# Patient Record
Sex: Male | Born: 1960 | Race: White | Hispanic: No | Marital: Single | Smoking: Never smoker
Health system: Southern US, Community
[De-identification: ages and names within clinical notes are randomized; demographics above are authoritative.]

## PROBLEM LIST (undated history)

## (undated) DIAGNOSIS — J45909 Unspecified asthma, uncomplicated: Secondary | ICD-10-CM

## (undated) DIAGNOSIS — E785 Hyperlipidemia, unspecified: Secondary | ICD-10-CM

## (undated) DIAGNOSIS — N200 Calculus of kidney: Secondary | ICD-10-CM

## (undated) DIAGNOSIS — I639 Cerebral infarction, unspecified: Secondary | ICD-10-CM

## (undated) DIAGNOSIS — I2510 Atherosclerotic heart disease of native coronary artery without angina pectoris: Secondary | ICD-10-CM

## (undated) DIAGNOSIS — G1221 Amyotrophic lateral sclerosis: Secondary | ICD-10-CM

## (undated) HISTORY — PX: KNEE SURGERY: SHX244

---

## 1998-10-02 HISTORY — PX: CYSTOSCOPY/RETROGRADE/URETEROSCOPY/STONE EXTRACTION WITH BASKET: SHX5317

## 2004-10-02 HISTORY — PX: CARDIAC CATHETERIZATION: SHX172

## 2005-02-15 ENCOUNTER — Emergency Department (HOSPITAL_COMMUNITY): Admission: EM | Admit: 2005-02-15 | Discharge: 2005-02-15 | Payer: Self-pay | Admitting: Emergency Medicine

## 2005-02-15 IMAGING — CR DG LUMBAR SPINE COMPLETE 4+V
5 series · 5 of 5 positions shown · non-contrast
Comparison: none

CLINICAL DATA: Fall, pain. 
 LUMBAR SPINE ? 4 VIEW, [DATE] AT [5C] HOURS:

[view not recorded (1 of 5)]
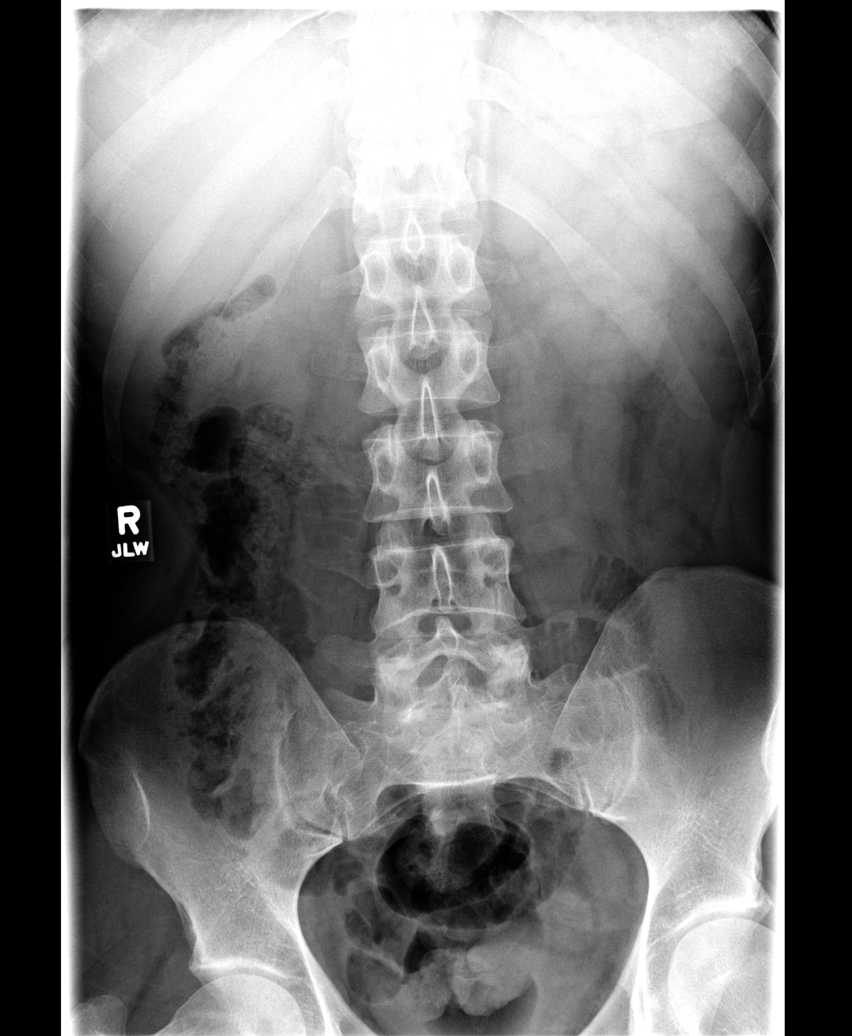

[view not recorded (2 of 5)]
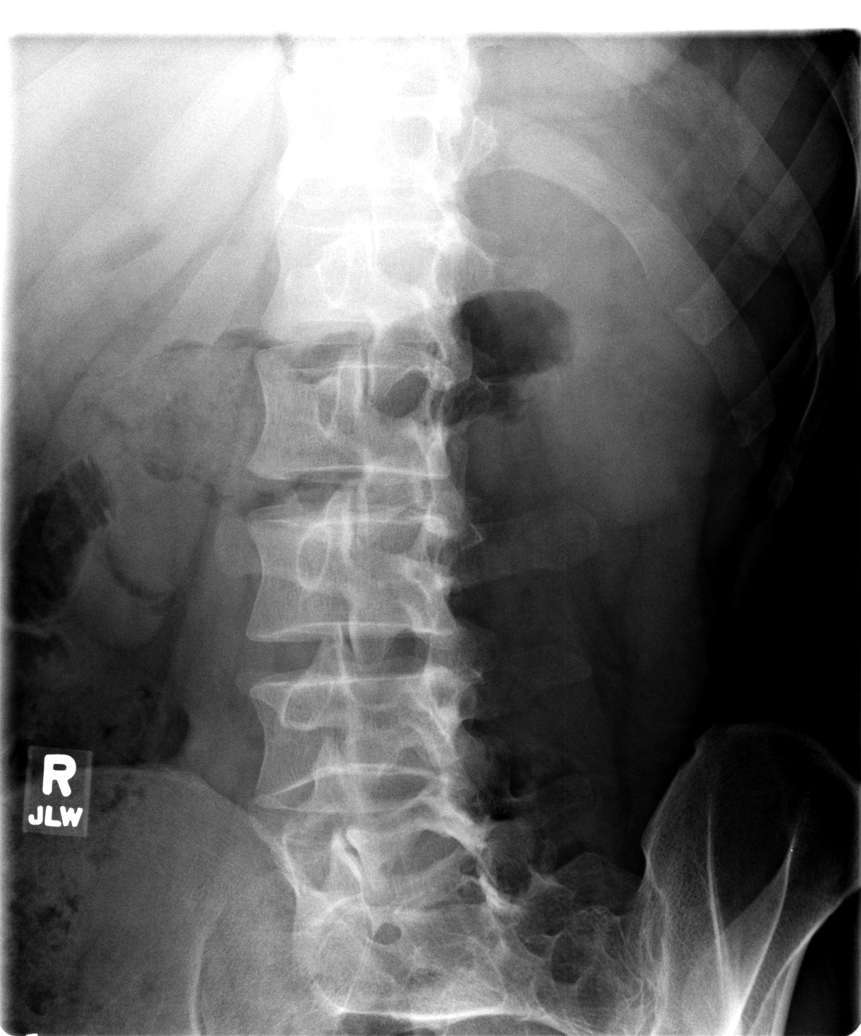

[view not recorded (3 of 5)]
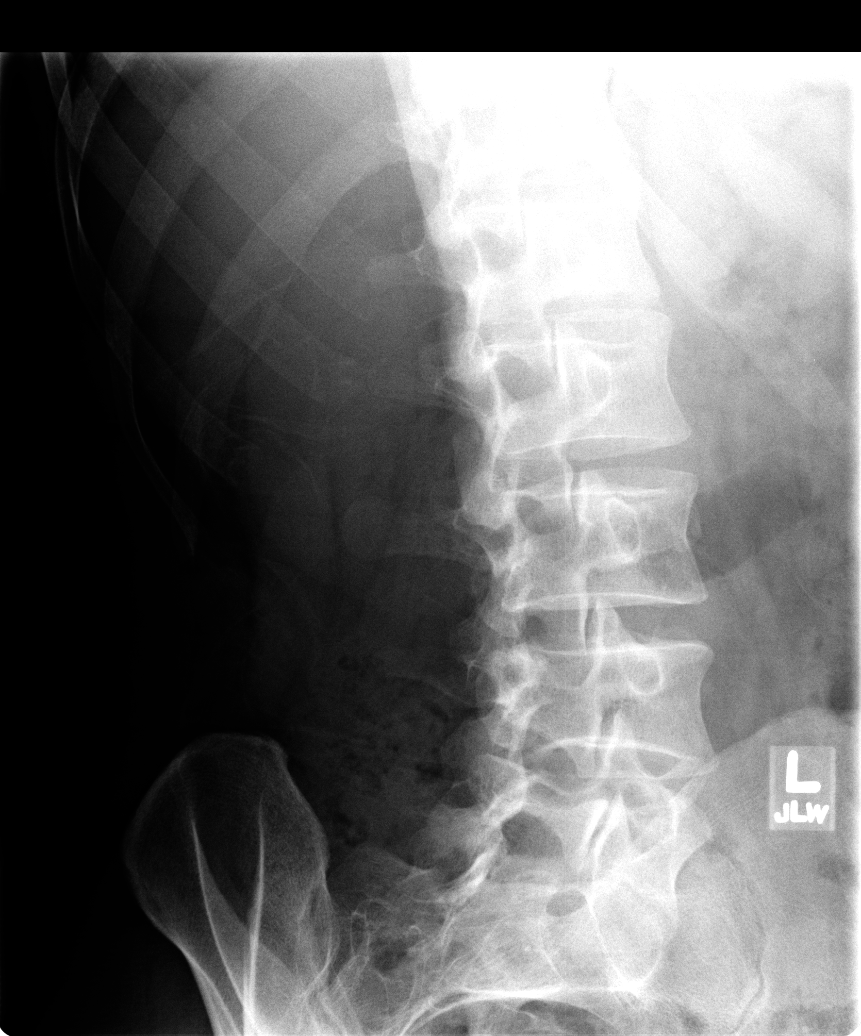

[view not recorded (4 of 5)]
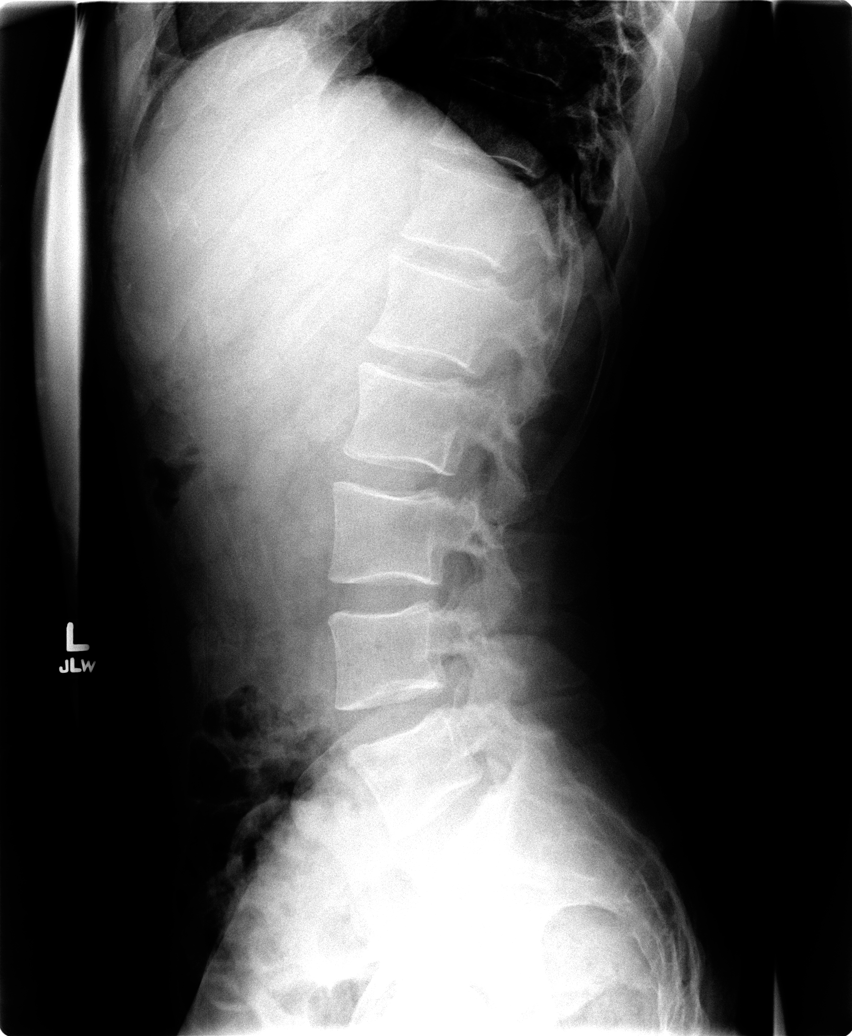

[view not recorded (5 of 5)]
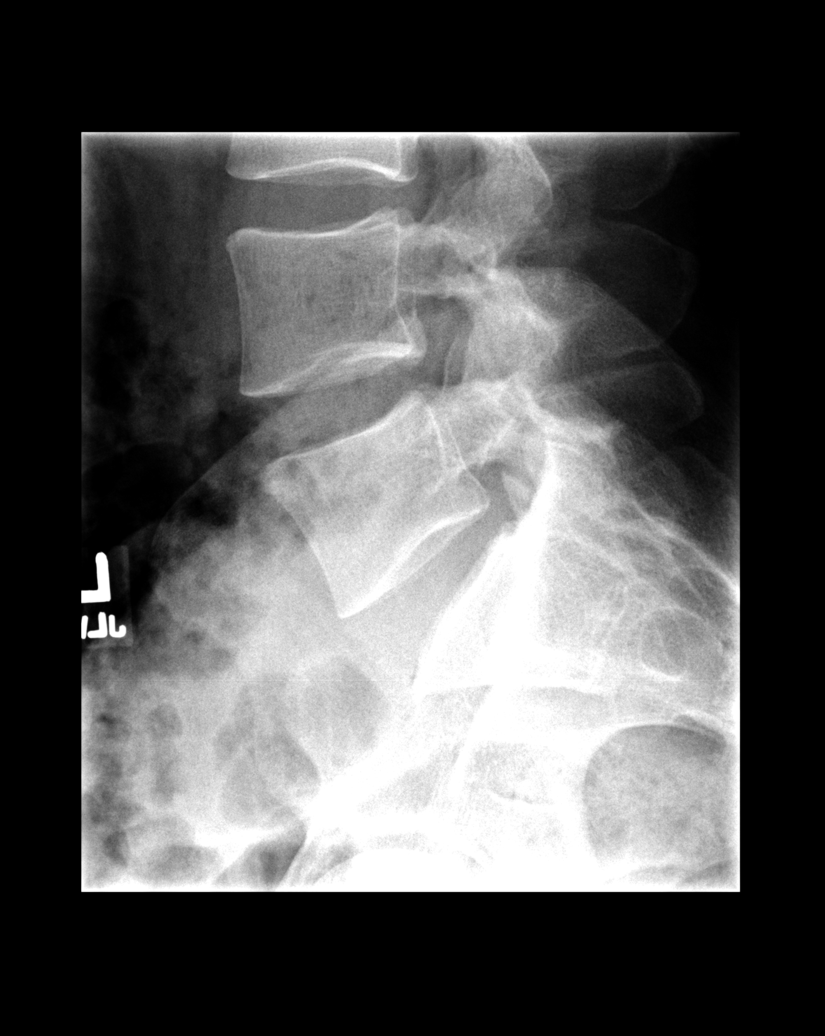

[5 of 5 positions shown; findings below may reference images not displayed]

FINDINGS: There is anatomic alignment through the vertebral bodies.  There is no vertebral body height loss.  Disc height is maintained.  No pars defects are seen.  Little if any degenerative change in the facets of the lower lumbar spine is seen.
IMPRESSION: No acute bony injury in the lumbar spine. 
 CERVICAL SPINE ? 4 VIEW, [DATE] AT [5C] HOURS:
FINDINGS: Severe narrowing of the C5-6 disc space is present with anterior and posterior osteophytes.  Moderate narrowing at C4-5 is present.  There is 1-2 mm retrolisthesis of C5 upon C6 noted, felt to be due to degenerative change.  There is no vertebral body height loss.  The soft tissues are within normal limits.  No definite acute fractures are seen.  The odontoid is within normal limits.  The swimmer?s view is included on the thoracic spine studies.
IMPRESSION: No acute bony injury in the cervical spine.  Degenerative changes are noted.

## 2005-02-15 IMAGING — CR DG CERVICAL SPINE COMPLETE 4+V
5 series · 5 of 5 positions shown · non-contrast
Comparison: none

CLINICAL DATA: Fall, pain. 
 LUMBAR SPINE ? 4 VIEW, [DATE] AT [5C] HOURS:

[view not recorded (1 of 5)]
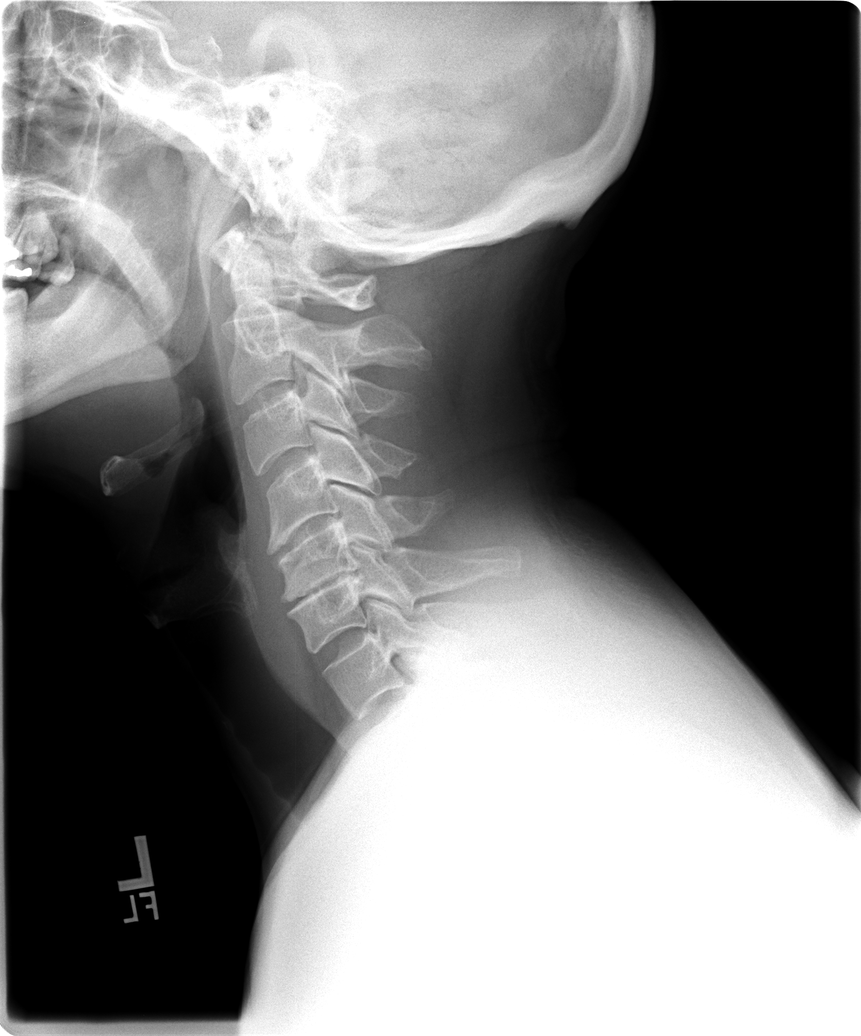

[view not recorded (2 of 5)]
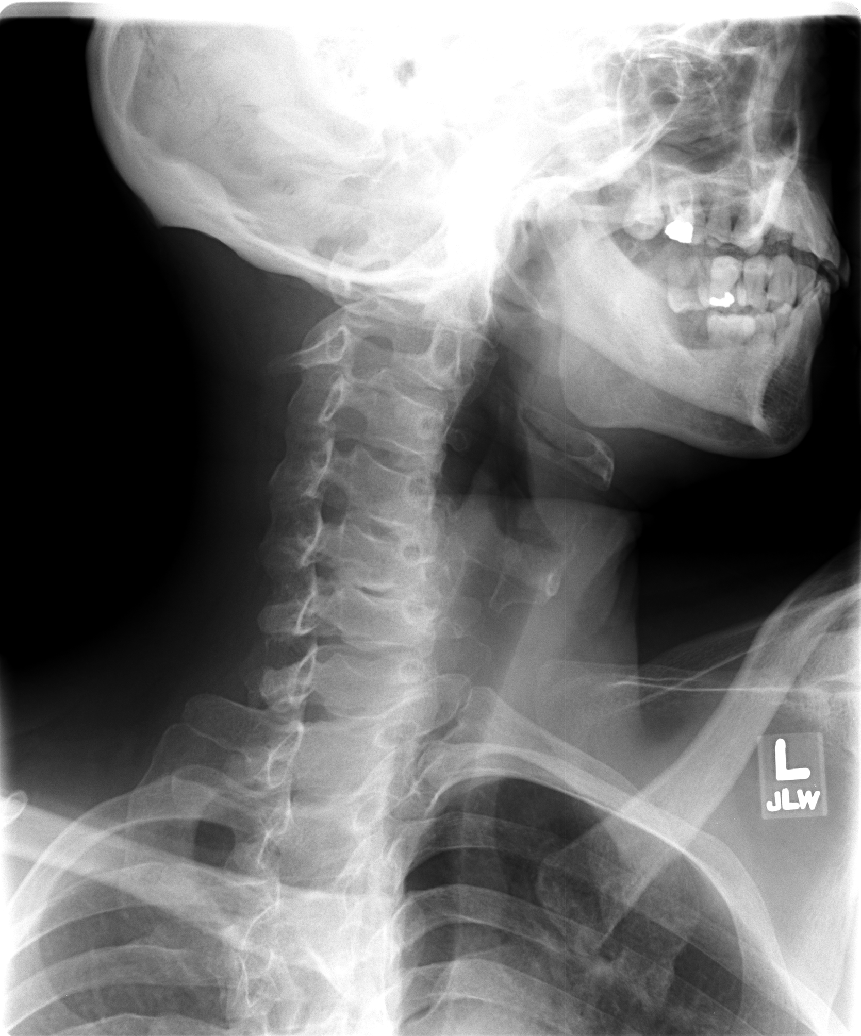

[view not recorded (3 of 5)]
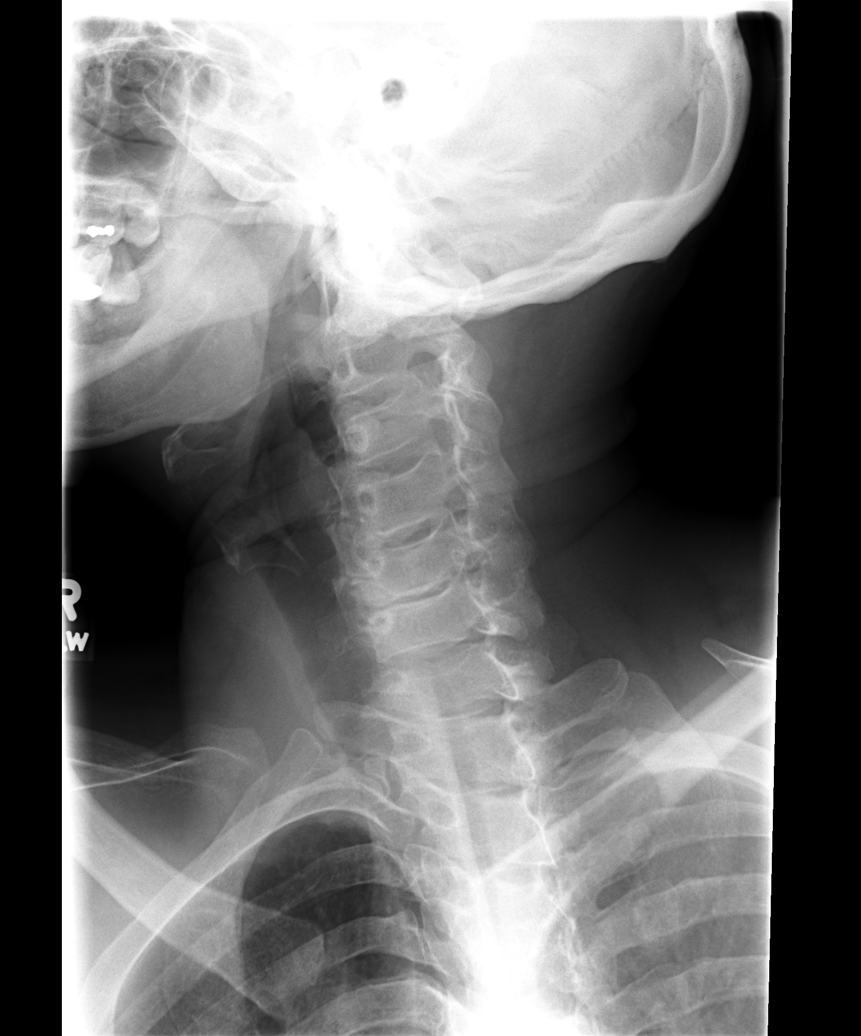

[view not recorded (4 of 5)]
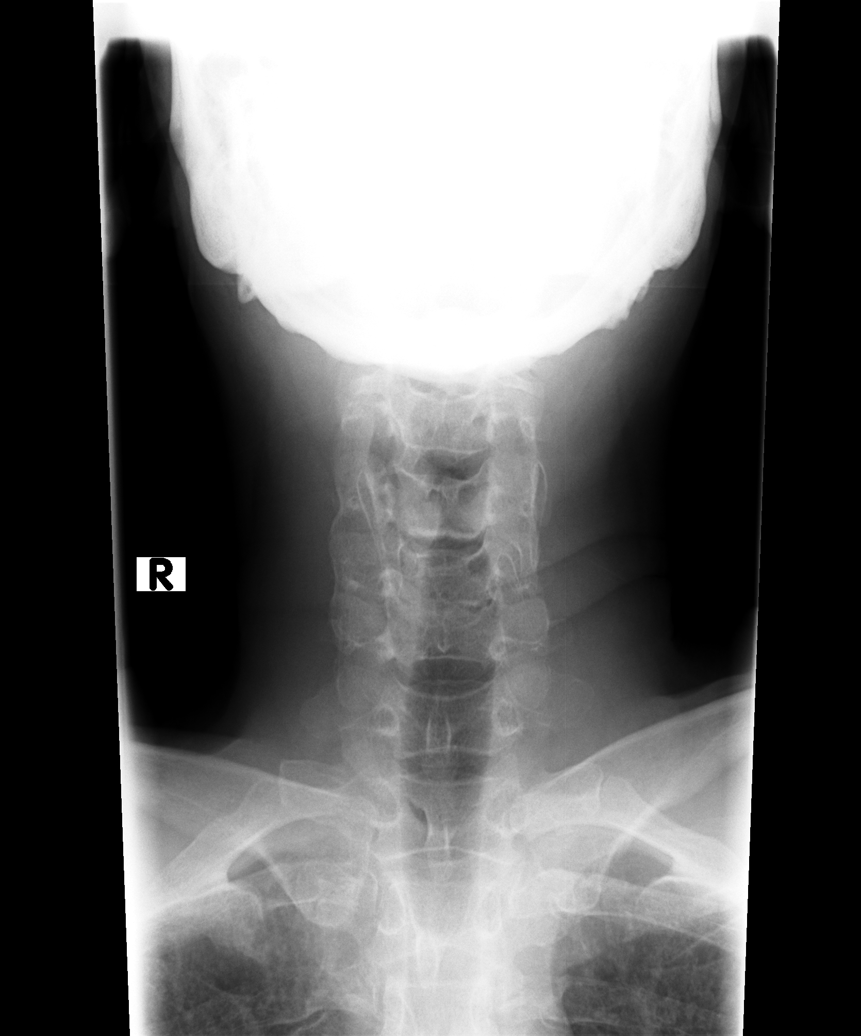

[view not recorded (5 of 5)]
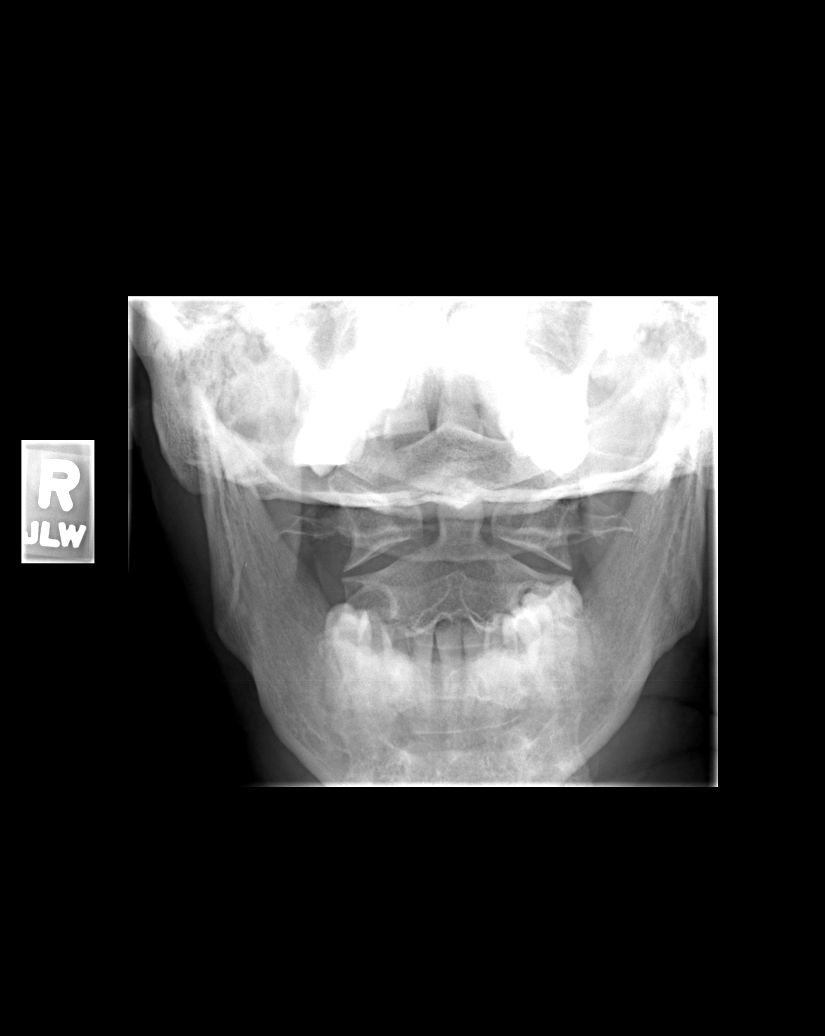

[5 of 5 positions shown; findings below may reference images not displayed]

FINDINGS: There is anatomic alignment through the vertebral bodies.  There is no vertebral body height loss.  Disc height is maintained.  No pars defects are seen.  Little if any degenerative change in the facets of the lower lumbar spine is seen.
IMPRESSION: No acute bony injury in the lumbar spine. 
 CERVICAL SPINE ? 4 VIEW, [DATE] AT [5C] HOURS:
FINDINGS: Severe narrowing of the C5-6 disc space is present with anterior and posterior osteophytes.  Moderate narrowing at C4-5 is present.  There is 1-2 mm retrolisthesis of C5 upon C6 noted, felt to be due to degenerative change.  There is no vertebral body height loss.  The soft tissues are within normal limits.  No definite acute fractures are seen.  The odontoid is within normal limits.  The swimmer?s view is included on the thoracic spine studies.
IMPRESSION: No acute bony injury in the cervical spine.  Degenerative changes are noted.

## 2005-02-15 IMAGING — CR DG THORACIC SPINE 2V
3 series · 3 of 3 positions shown · non-contrast
Comparison: none

CLINICAL DATA: Fall. 
 THORACIC SPINE ? 2 VIEW, [DATE] AT [CE] HOURS:

[view not recorded (1 of 3)]
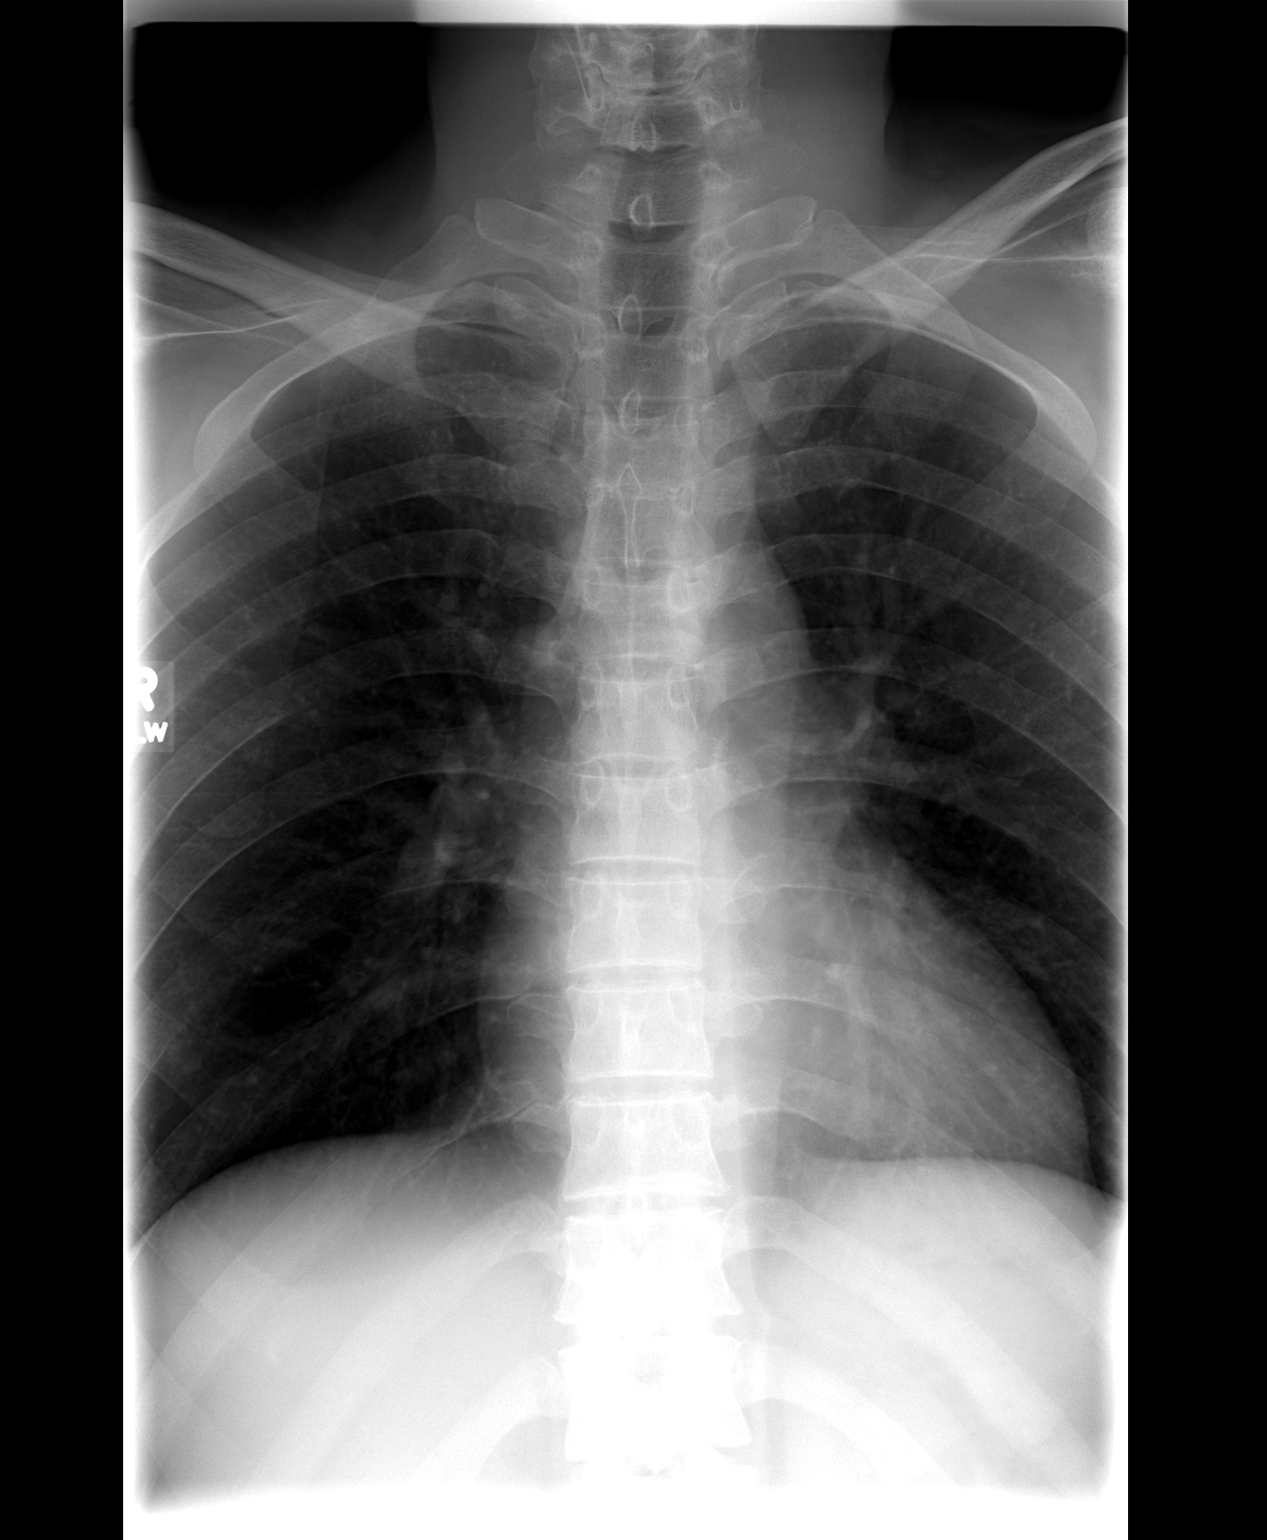

[view not recorded (2 of 3)]
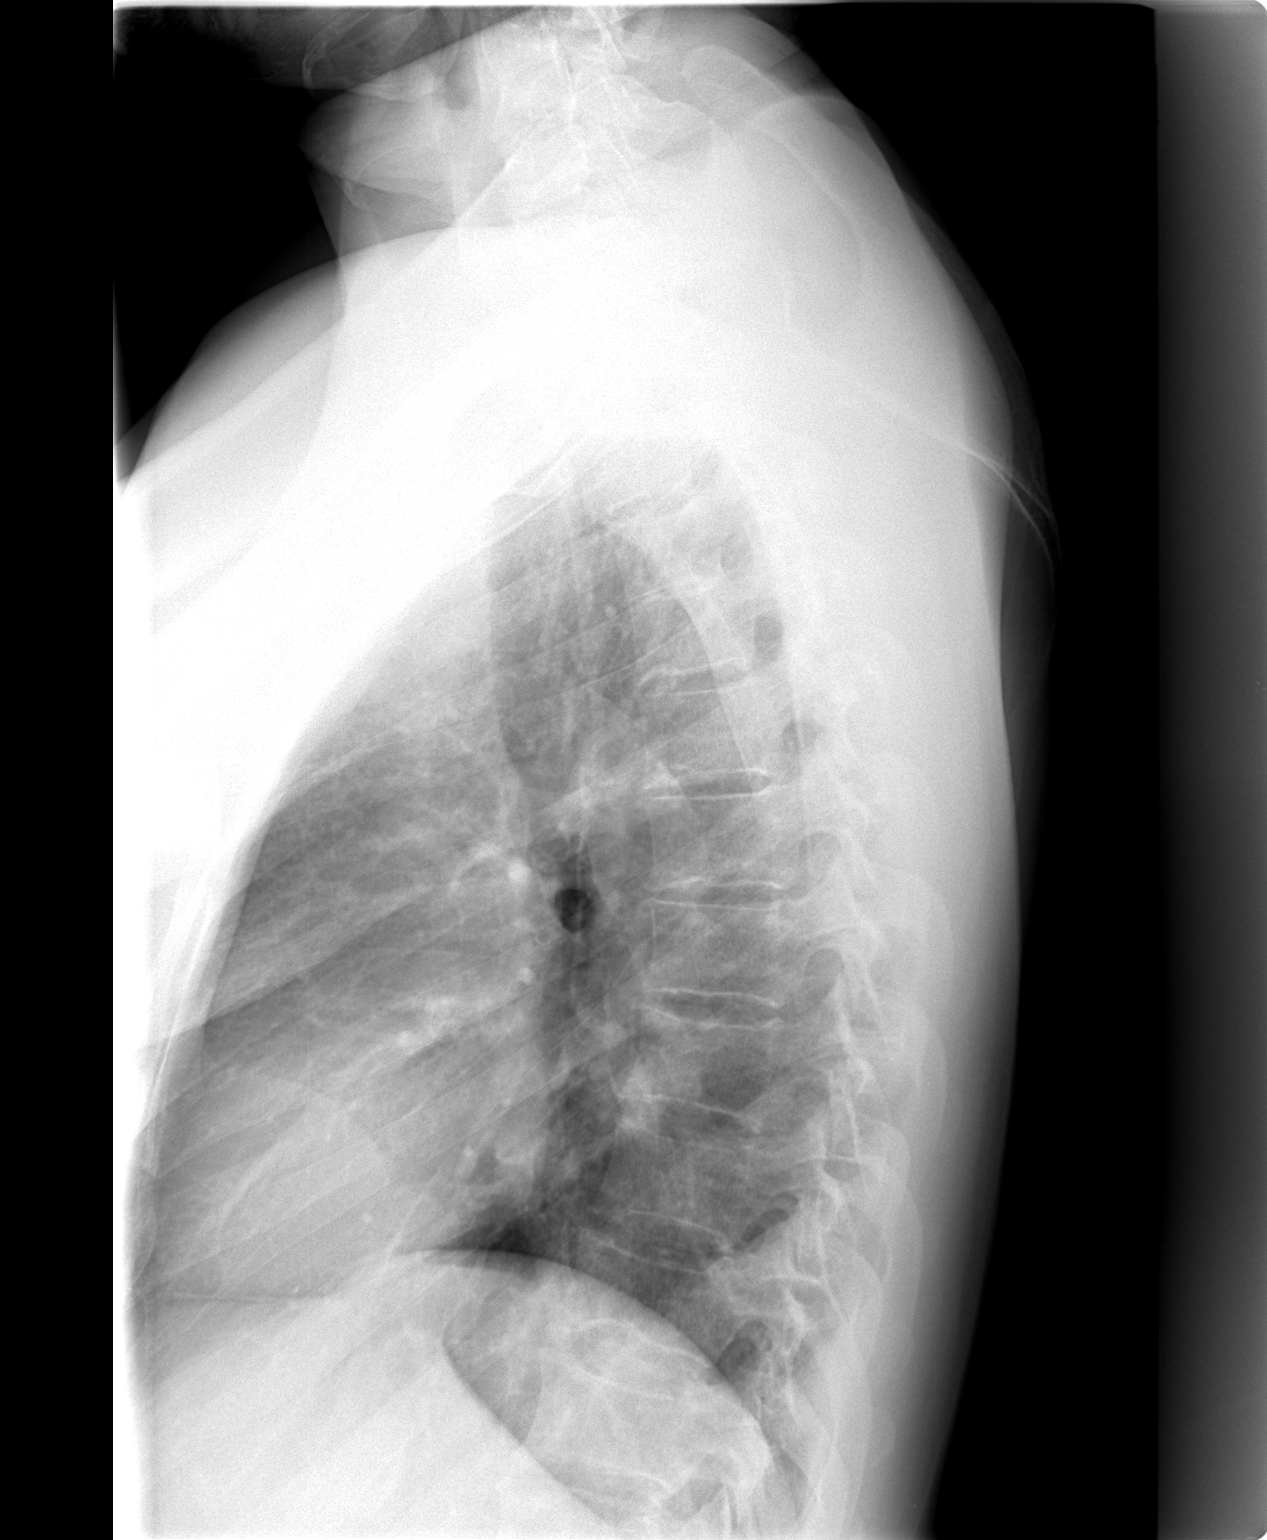

[view not recorded (3 of 3)]
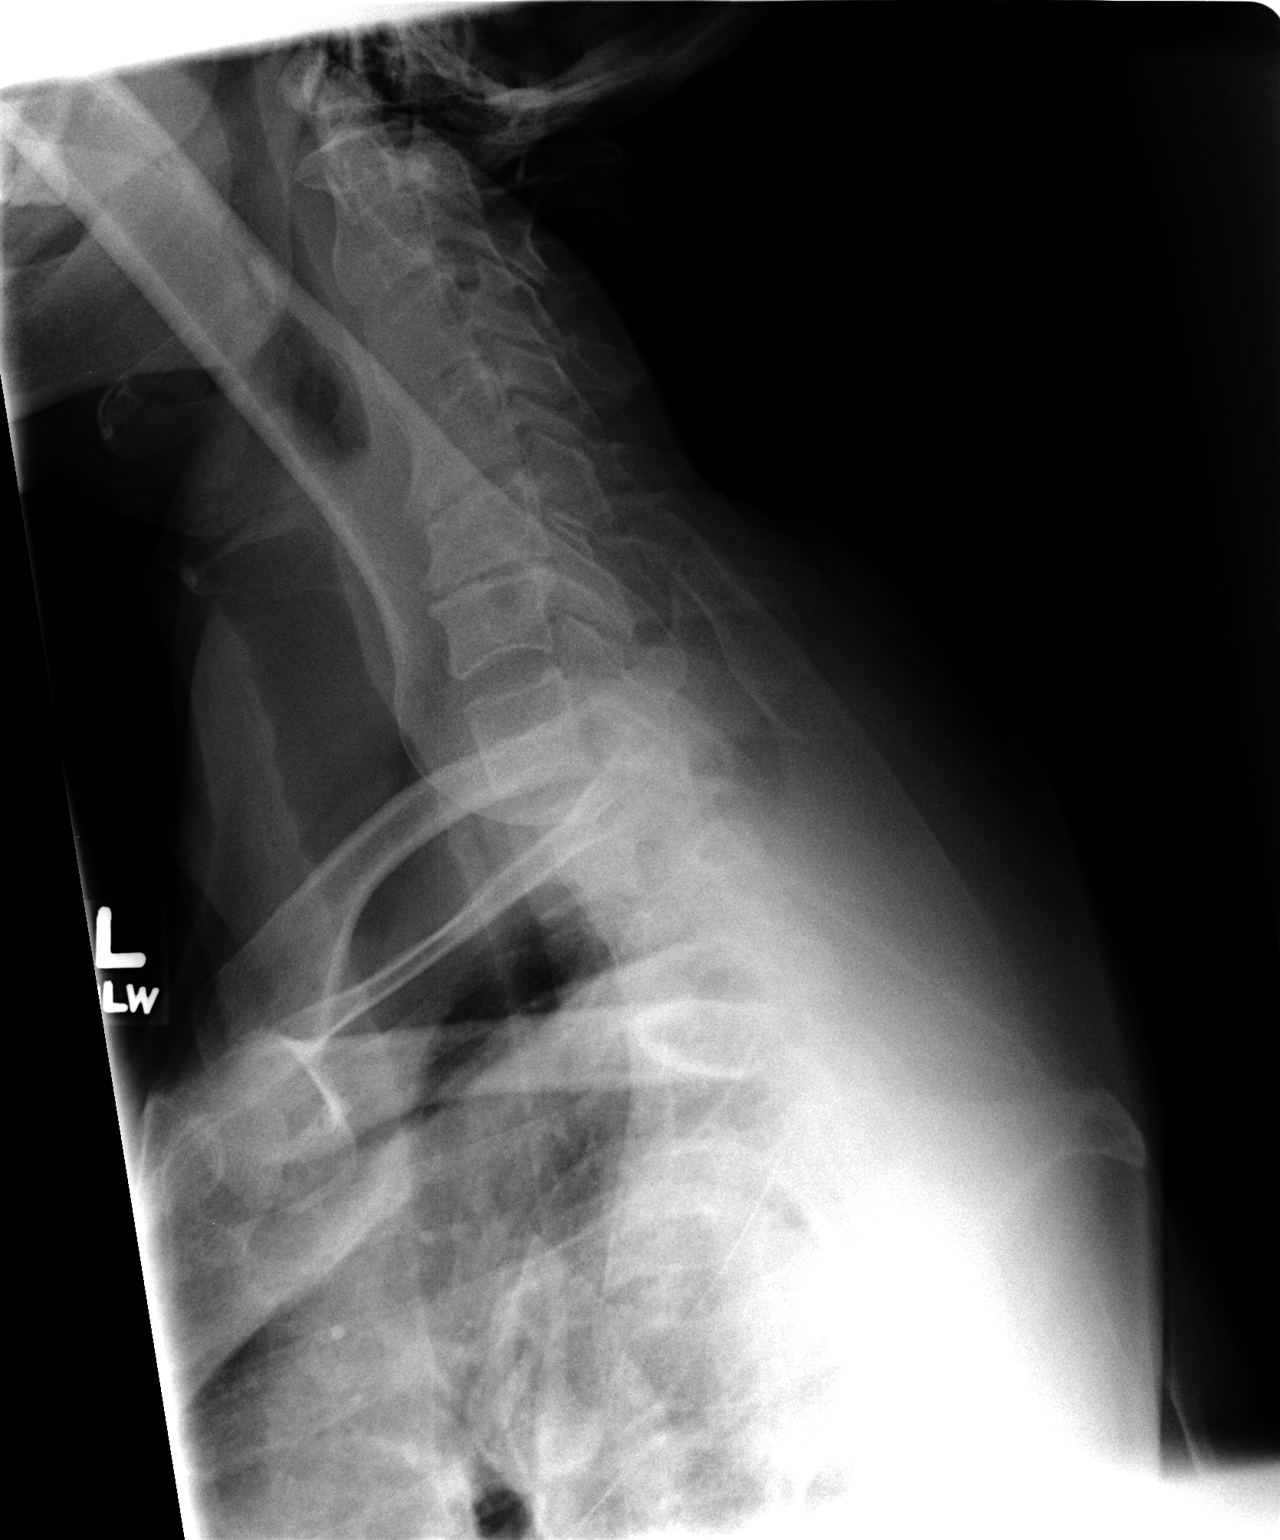

[3 of 3 positions shown; findings below may reference images not displayed]

FINDINGS: The upper thoracic spine is obscured on the swimmer?s view.  Throughout the visualized thoracic spine, there is no acute fracture or dislocation.  There is anatomic alignment to the vertebral bodies.
IMPRESSION: Limited view of the upper thoracic spine.  Otherwise no evidence of acute bony injury.

## 2005-02-15 IMAGING — CR DG CHEST 2V
2 series · 2 of 2 positions shown · non-contrast
Comparison: none

CLINICAL DATA: Fall.  Chest pain. 
 RIGHT RIB STUDY ? 2 VIEW, [DATE] AT [2I] HOURS:

[view not recorded (1 of 2)]
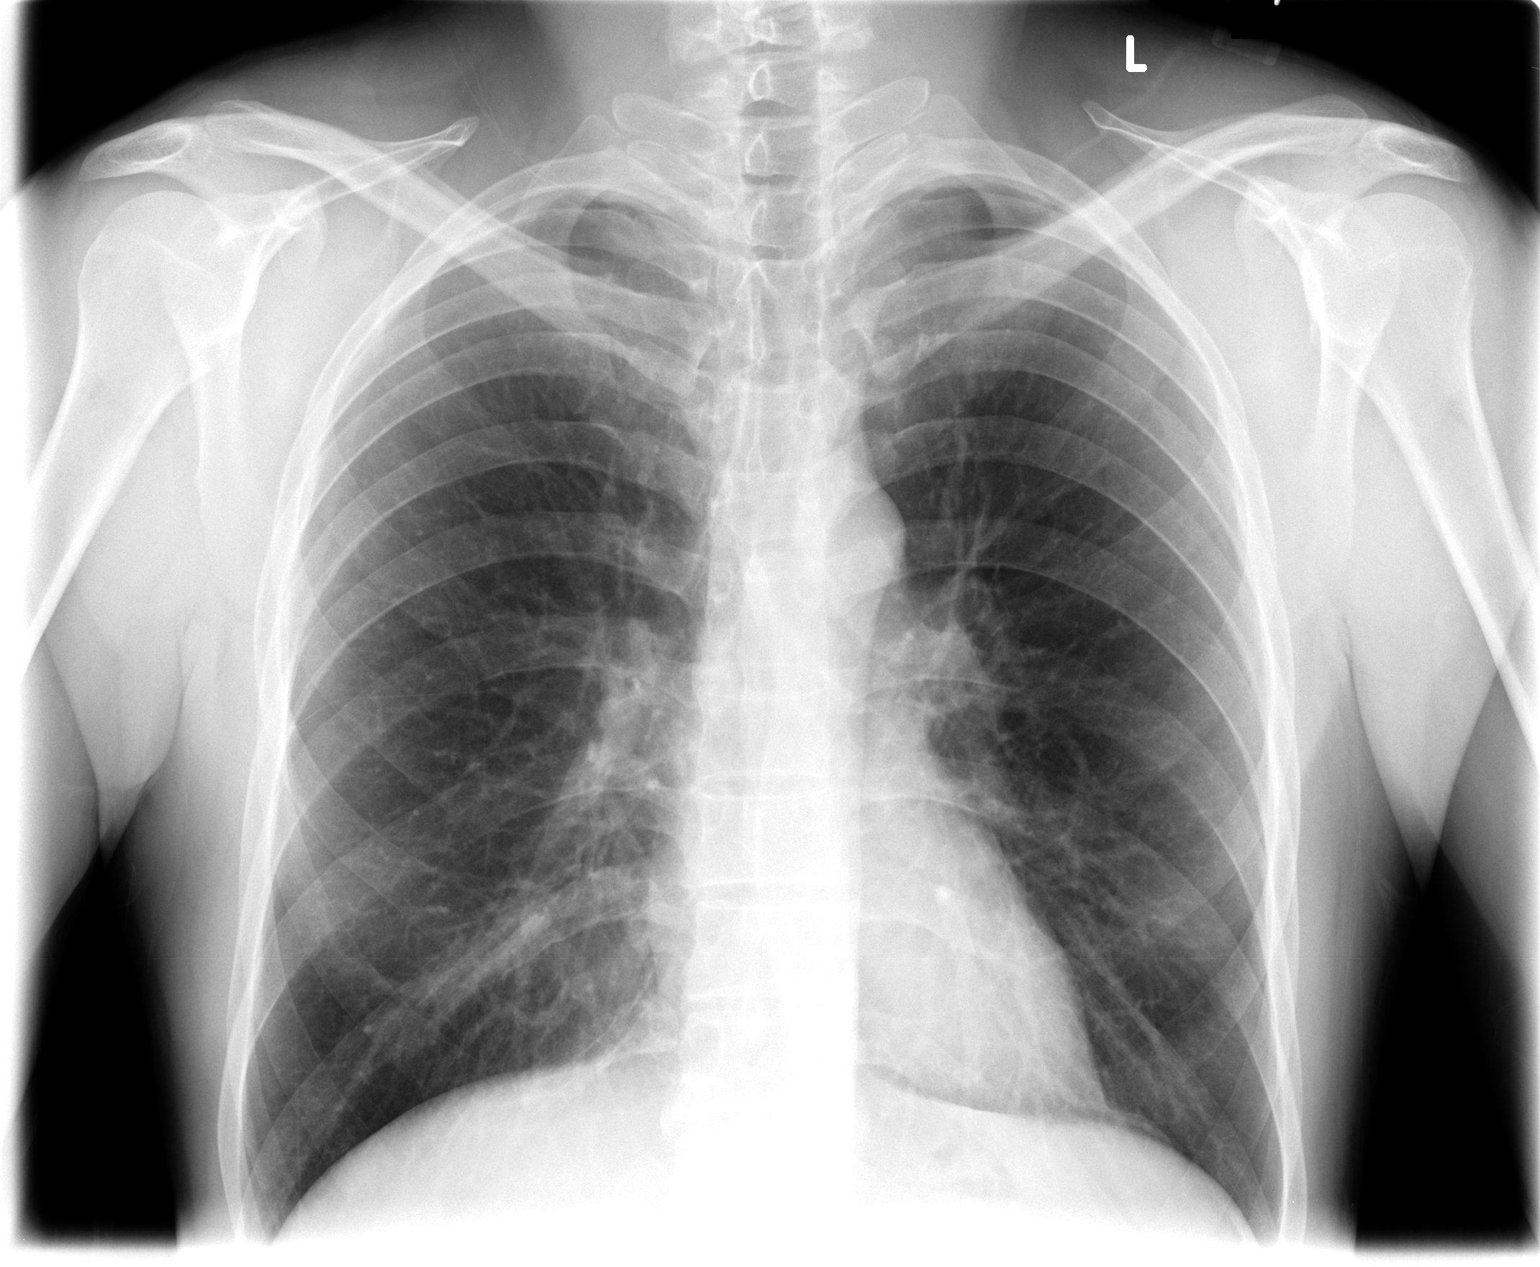

[view not recorded (2 of 2)]
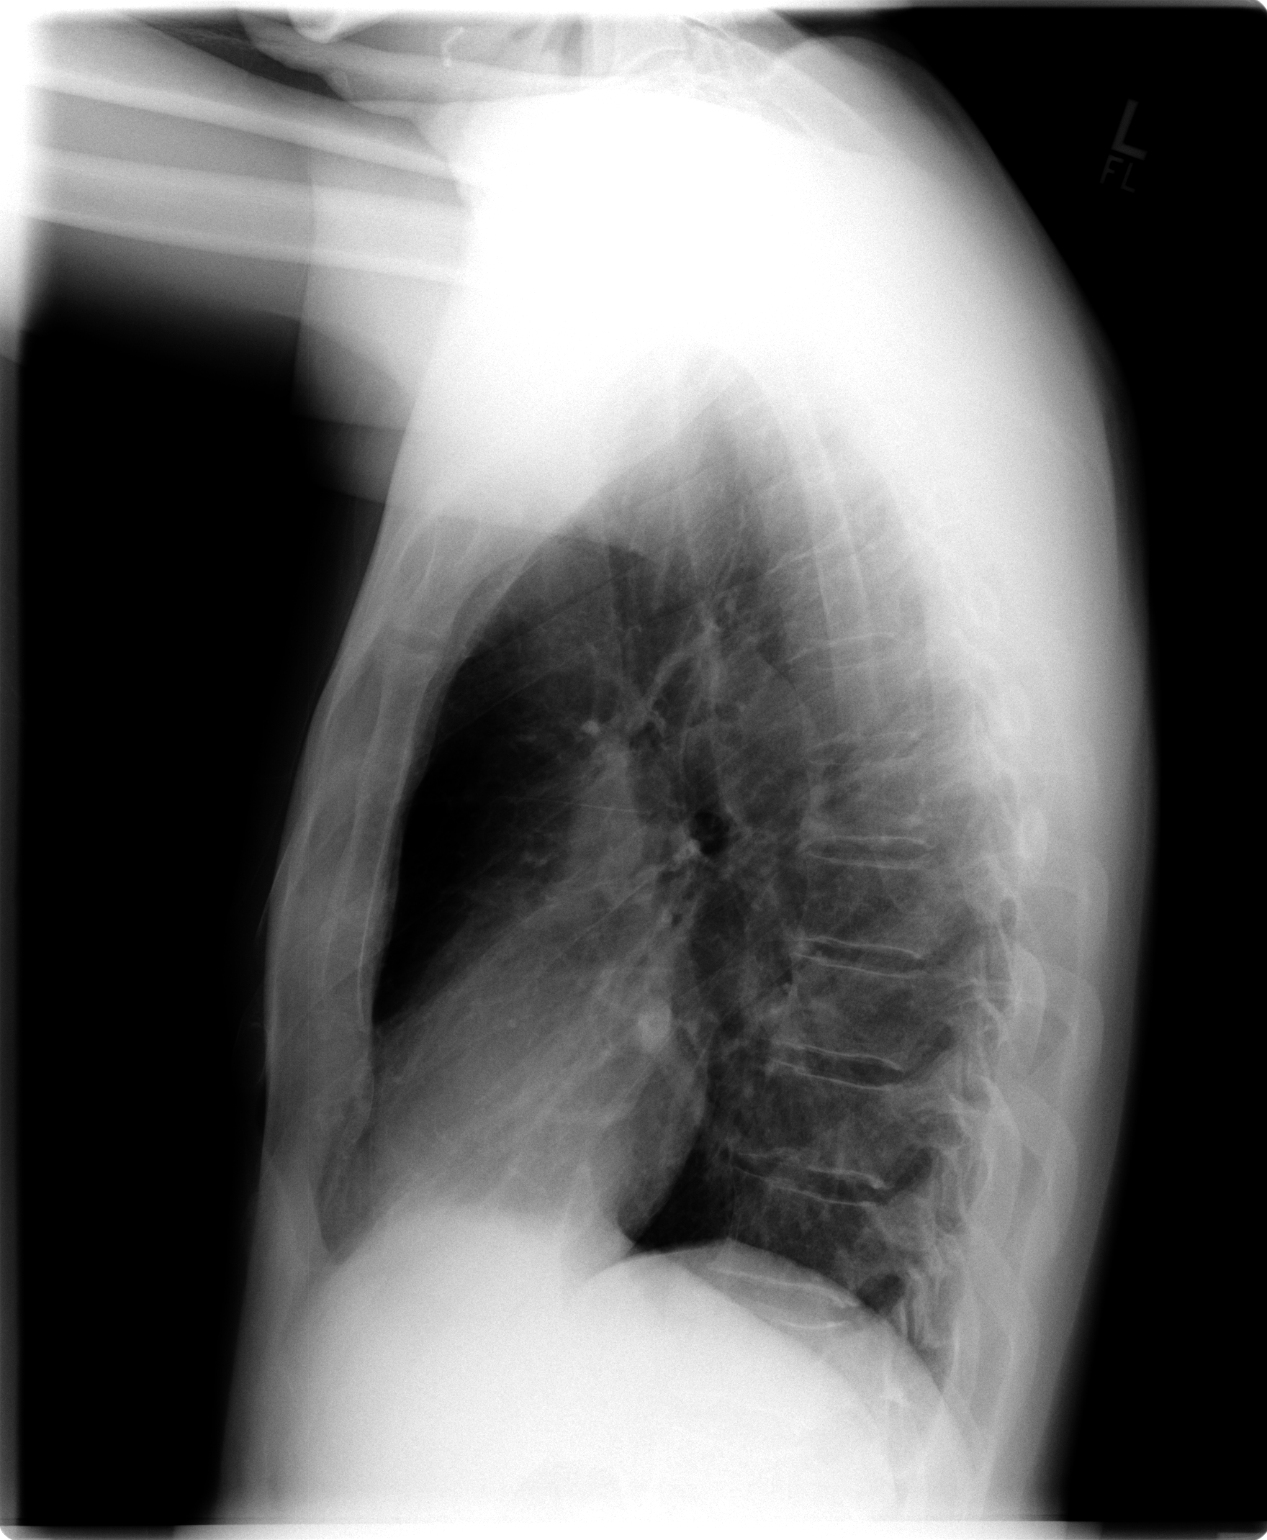

[2 of 2 positions shown; findings below may reference images not displayed]

FINDINGS: No acute fractures or dislocations are seen.  No underlying pneumothorax is seen.
IMPRESSION: No evidence of rib fracture.
 CHEST ? 2 VIEW, [DATE] AT [2I] HOURS:
FINDINGS: The cardiomediastinal silhouette is within normal limits.  The lungs are well inflated and clear.  No rib fractures are seen.  No pneumothorax is seen.
IMPRESSION: No evidence of acute cardiopulmonary disease.

## 2005-02-15 IMAGING — CR DG RIBS 2V*R*
2 series · 2 of 2 positions shown · non-contrast
Comparison: none

CLINICAL DATA: Fall.  Chest pain. 
 RIGHT RIB STUDY ? 2 VIEW, [DATE] AT [2I] HOURS:

[view not recorded (1 of 2)]
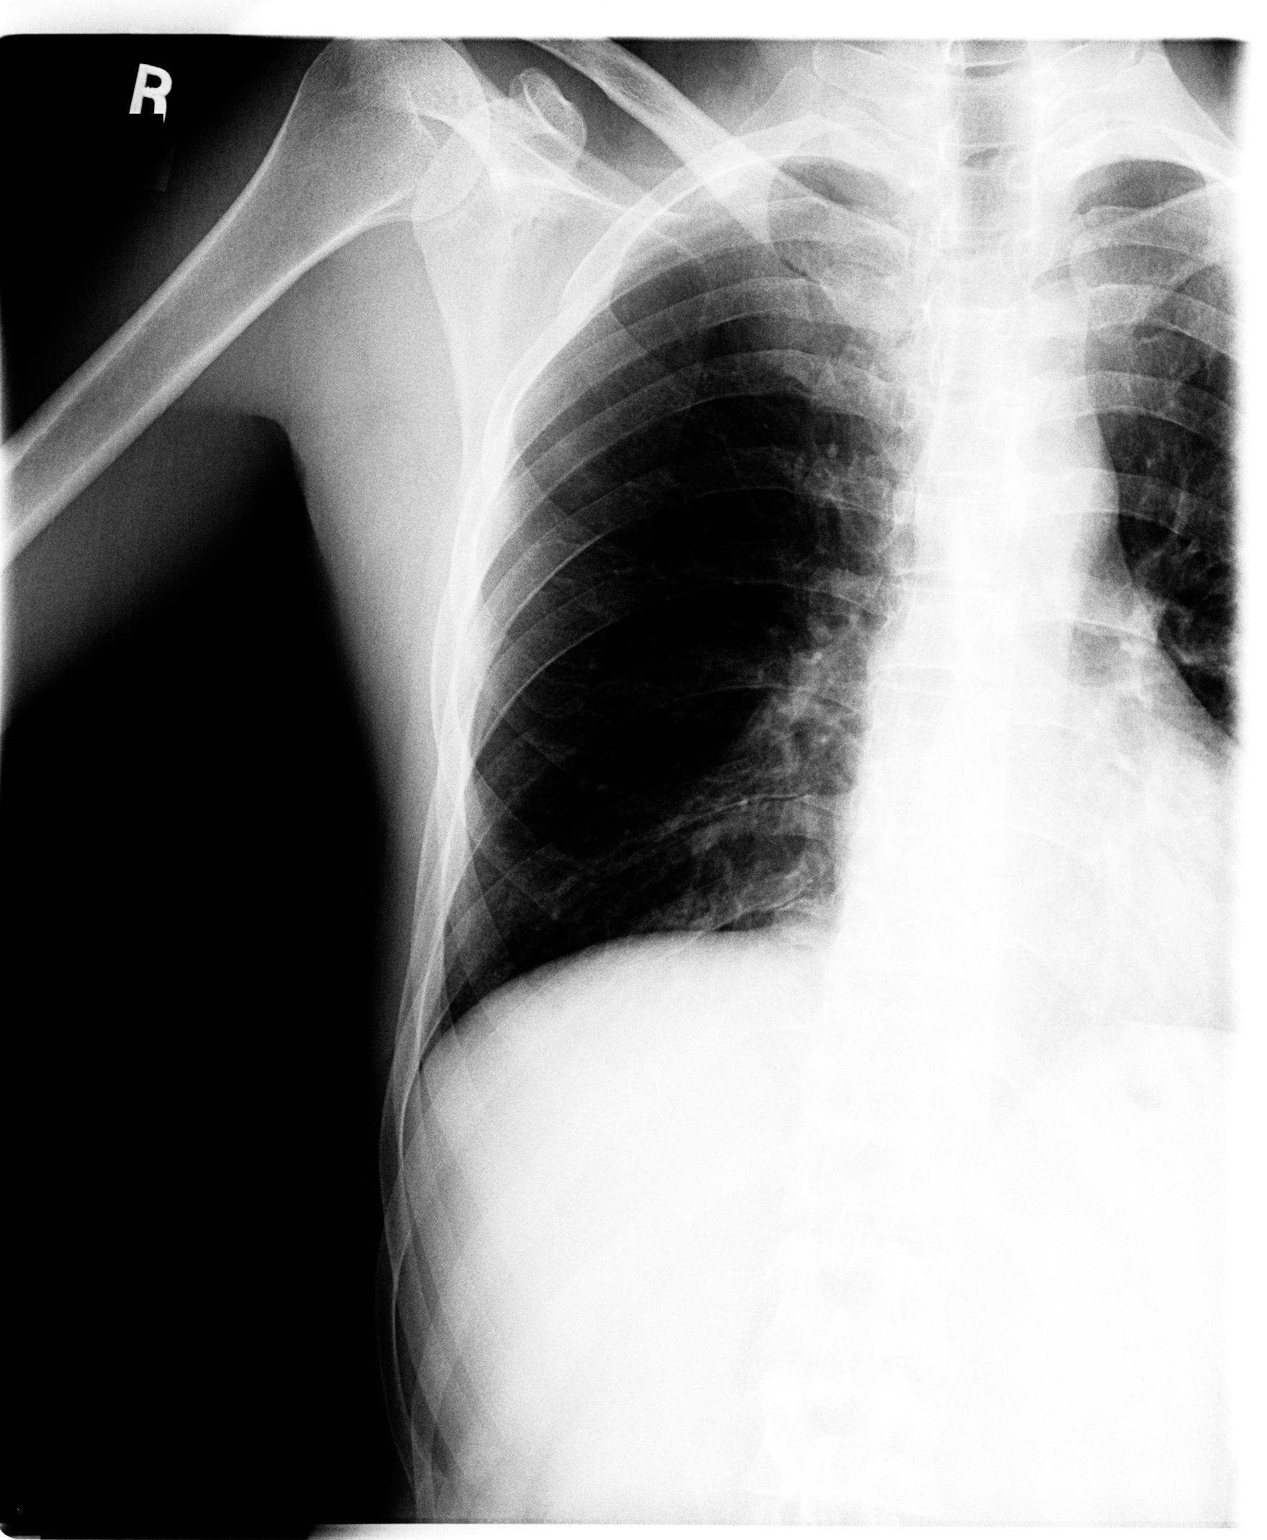

[view not recorded (2 of 2)]
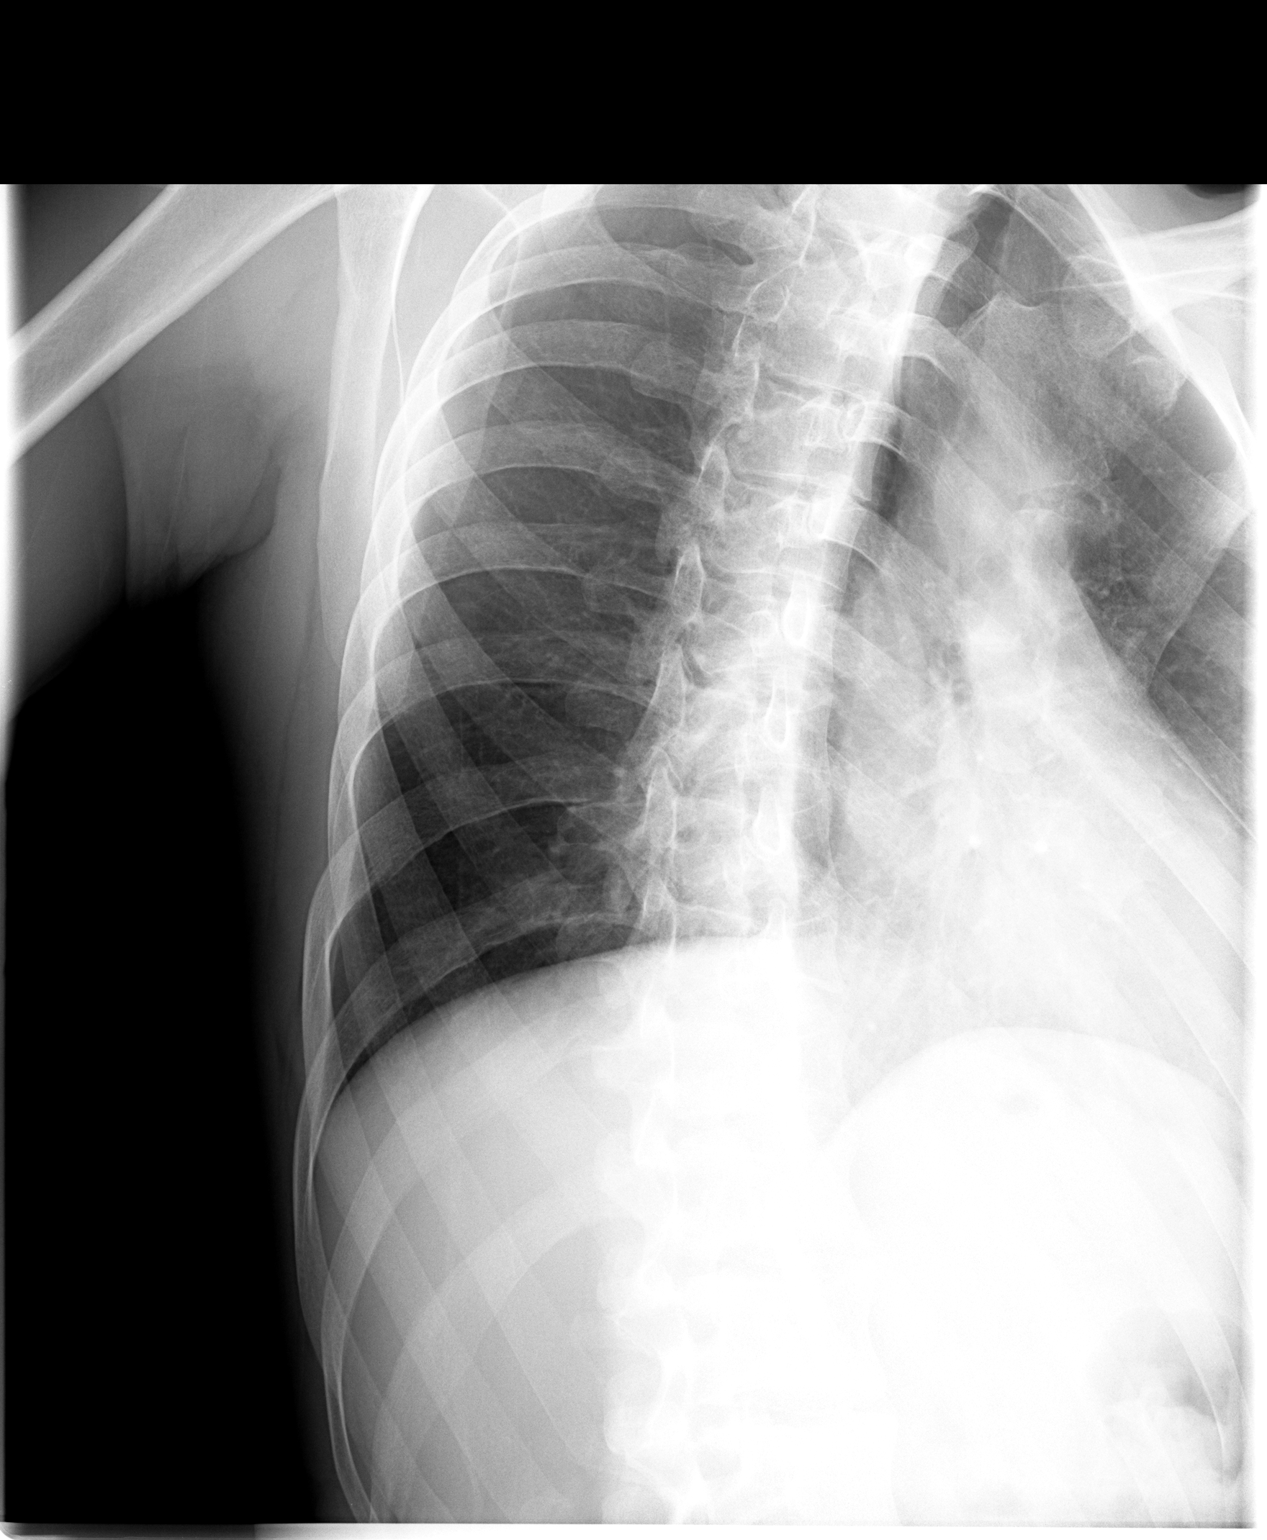

[2 of 2 positions shown; findings below may reference images not displayed]

FINDINGS: No acute fractures or dislocations are seen.  No underlying pneumothorax is seen.
IMPRESSION: No evidence of rib fracture.
 CHEST ? 2 VIEW, [DATE] AT [2I] HOURS:
FINDINGS: The cardiomediastinal silhouette is within normal limits.  The lungs are well inflated and clear.  No rib fractures are seen.  No pneumothorax is seen.
IMPRESSION: No evidence of acute cardiopulmonary disease.

## 2005-02-21 ENCOUNTER — Emergency Department (HOSPITAL_COMMUNITY): Admission: EM | Admit: 2005-02-21 | Discharge: 2005-02-21 | Payer: Self-pay | Admitting: Emergency Medicine

## 2005-02-21 IMAGING — CR DG CHEST 2V
2 series · 2 of 2 positions shown · non-contrast
Comparison: [DATE].

CLINICAL DATA: Dyspnea.  Right chest pain.  
 CHEST - TWO VIEW:

[view not recorded (1 of 2)]
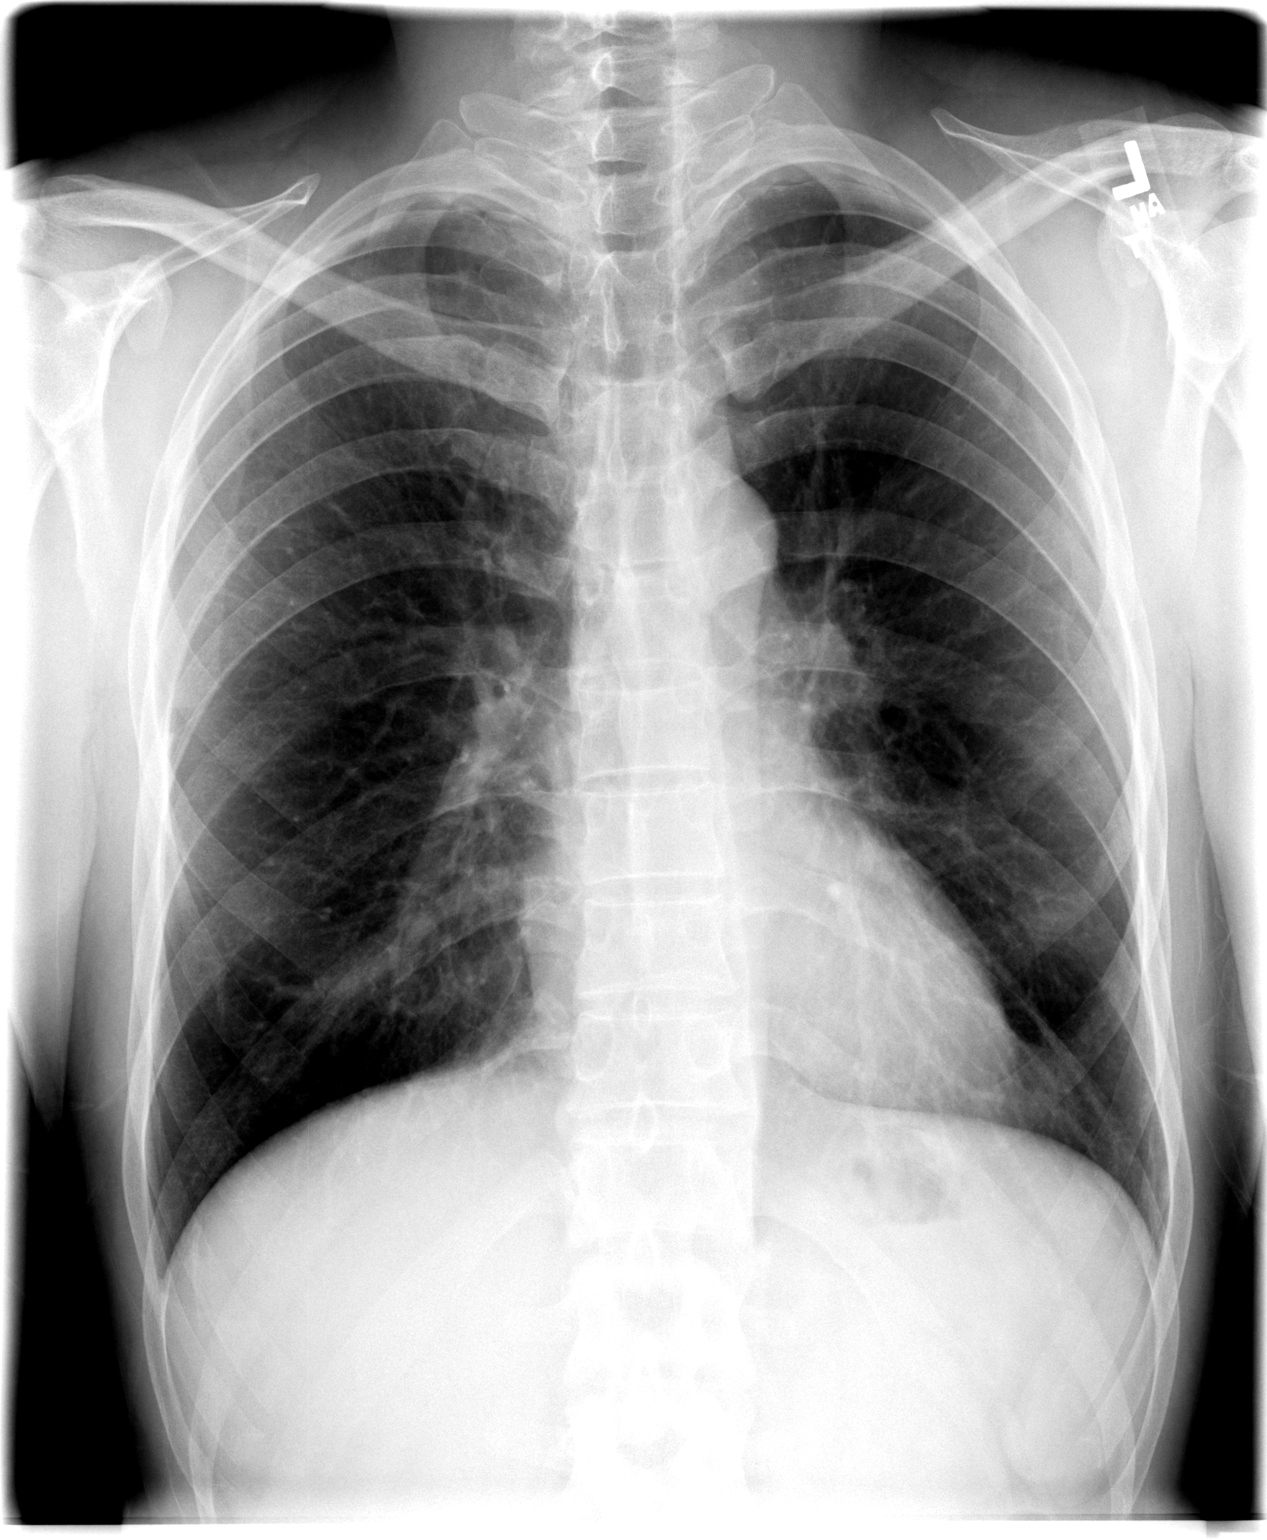

[view not recorded (2 of 2)]
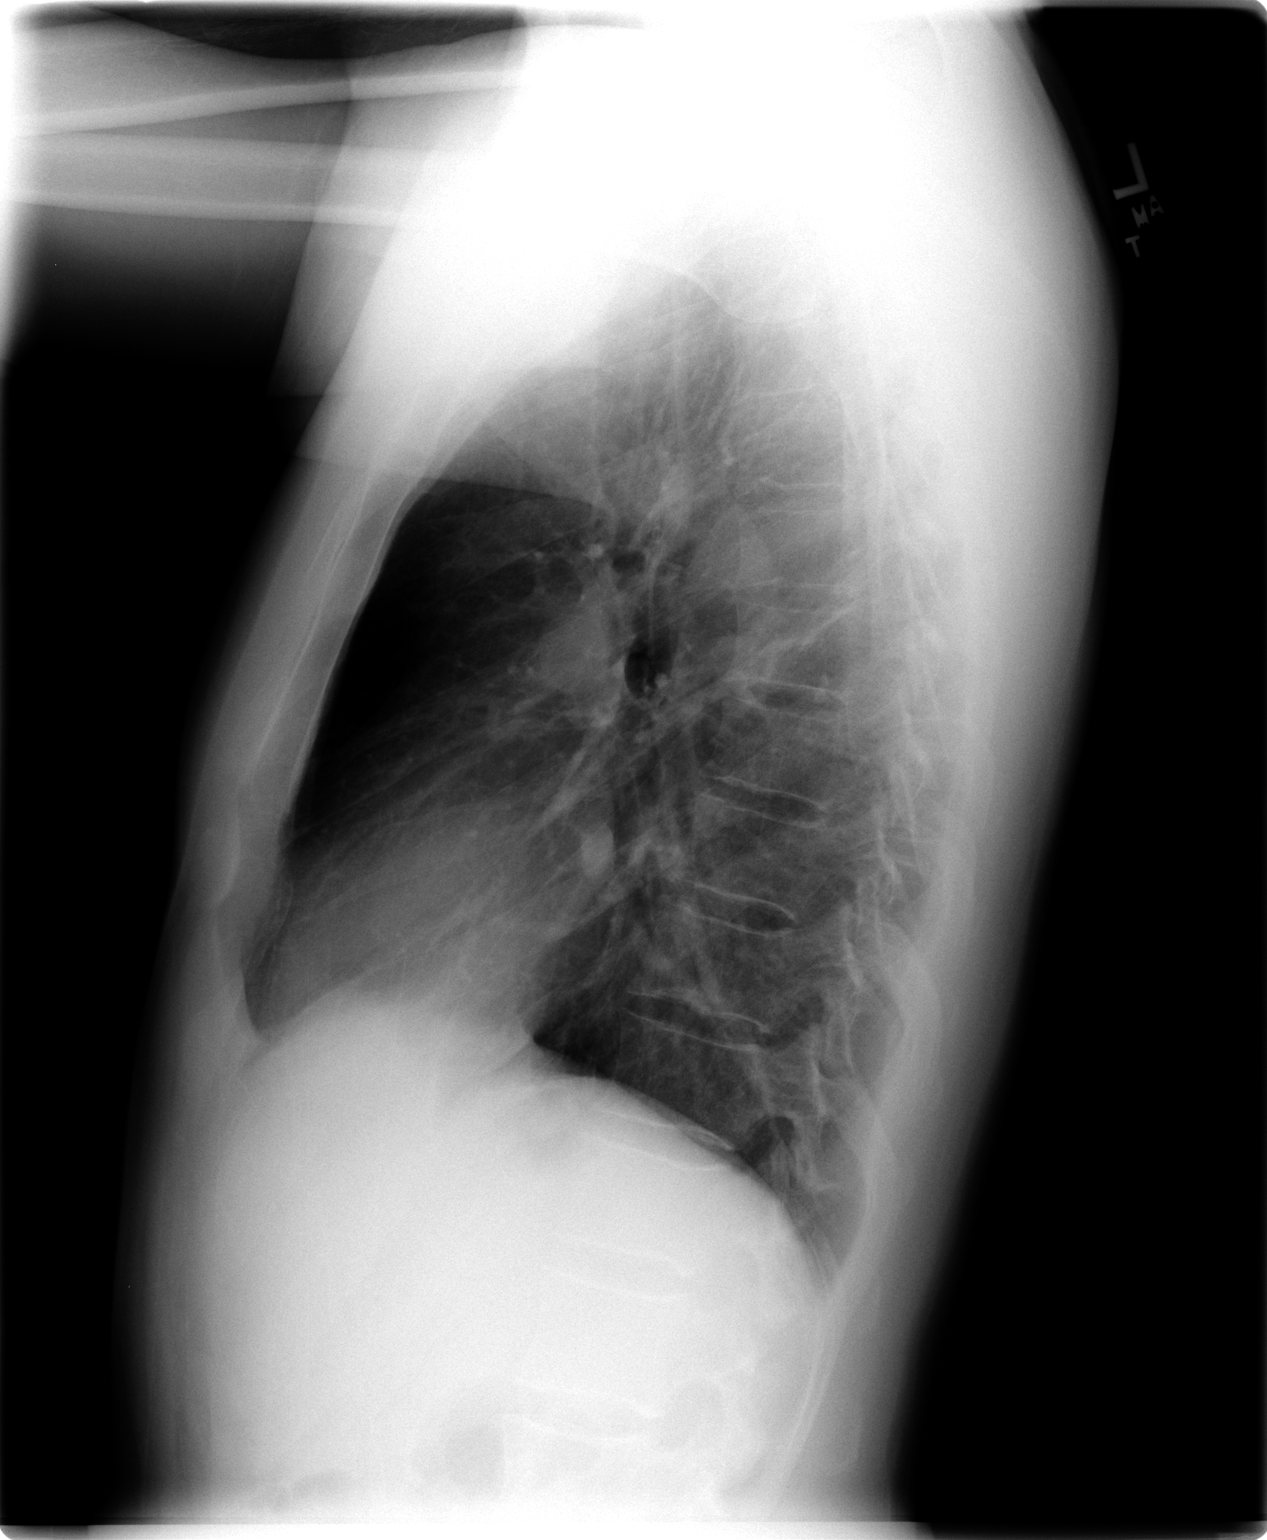

[2 of 2 positions shown; findings below may reference images not displayed]

Mild peribronchial thickening noted.  Remainder of the lungs is clear.  Cardiomediastinal silhouette, bony thoracic, and upper abdomen are unremarkable.  No pleural effusions or pneumothorax identified.
IMPRESSION: Mild peribronchial thickening without focal airspace disease.

## 2005-02-23 ENCOUNTER — Emergency Department (HOSPITAL_COMMUNITY): Admission: EM | Admit: 2005-02-23 | Discharge: 2005-02-23 | Payer: Self-pay | Admitting: Emergency Medicine

## 2005-02-24 ENCOUNTER — Emergency Department (HOSPITAL_COMMUNITY): Admission: EM | Admit: 2005-02-24 | Discharge: 2005-02-24 | Payer: Self-pay | Admitting: Emergency Medicine

## 2008-10-02 HISTORY — PX: SHOULDER SURGERY: SHX246

## 2015-05-20 ENCOUNTER — Observation Stay: Payer: BLUE CROSS/BLUE SHIELD | Admitting: Anesthesiology

## 2015-05-20 ENCOUNTER — Encounter: Payer: Self-pay | Admitting: Emergency Medicine

## 2015-05-20 ENCOUNTER — Observation Stay
Admission: EM | Admit: 2015-05-20 | Discharge: 2015-05-20 | Disposition: A | Discharge status: Home / Self Care | Payer: BLUE CROSS/BLUE SHIELD | Attending: Internal Medicine | Admitting: Internal Medicine

## 2015-05-20 ENCOUNTER — Encounter
Admission: EM | Disposition: A | Discharge status: Home / Self Care | Payer: Self-pay | Source: Home / Self Care | Attending: Emergency Medicine

## 2015-05-20 ENCOUNTER — Emergency Department: Payer: BLUE CROSS/BLUE SHIELD

## 2015-05-20 ENCOUNTER — Other Ambulatory Visit: Payer: Self-pay

## 2015-05-20 DIAGNOSIS — N200 Calculus of kidney: Principal | ICD-10-CM | POA: Insufficient documentation

## 2015-05-20 DIAGNOSIS — Z87442 Personal history of urinary calculi: Secondary | ICD-10-CM | POA: Insufficient documentation

## 2015-05-20 DIAGNOSIS — N132 Hydronephrosis with renal and ureteral calculous obstruction: Secondary | ICD-10-CM | POA: Insufficient documentation

## 2015-05-20 DIAGNOSIS — R1010 Upper abdominal pain, unspecified: Secondary | ICD-10-CM | POA: Diagnosis present

## 2015-05-20 DIAGNOSIS — Z809 Family history of malignant neoplasm, unspecified: Secondary | ICD-10-CM | POA: Insufficient documentation

## 2015-05-20 DIAGNOSIS — R55 Syncope and collapse: Secondary | ICD-10-CM | POA: Insufficient documentation

## 2015-05-20 DIAGNOSIS — Z880 Allergy status to penicillin: Secondary | ICD-10-CM | POA: Insufficient documentation

## 2015-05-20 DIAGNOSIS — Z823 Family history of stroke: Secondary | ICD-10-CM | POA: Diagnosis not present

## 2015-05-20 DIAGNOSIS — Z885 Allergy status to narcotic agent status: Secondary | ICD-10-CM | POA: Insufficient documentation

## 2015-05-20 DIAGNOSIS — R300 Dysuria: Secondary | ICD-10-CM | POA: Insufficient documentation

## 2015-05-20 DIAGNOSIS — R112 Nausea with vomiting, unspecified: Secondary | ICD-10-CM | POA: Insufficient documentation

## 2015-05-20 DIAGNOSIS — R0602 Shortness of breath: Secondary | ICD-10-CM | POA: Diagnosis not present

## 2015-05-20 DIAGNOSIS — Z8249 Family history of ischemic heart disease and other diseases of the circulatory system: Secondary | ICD-10-CM | POA: Diagnosis not present

## 2015-05-20 DIAGNOSIS — Z833 Family history of diabetes mellitus: Secondary | ICD-10-CM | POA: Diagnosis not present

## 2015-05-20 DIAGNOSIS — J45909 Unspecified asthma, uncomplicated: Secondary | ICD-10-CM | POA: Diagnosis not present

## 2015-05-20 DIAGNOSIS — Z888 Allergy status to other drugs, medicaments and biological substances status: Secondary | ICD-10-CM | POA: Diagnosis not present

## 2015-05-20 DIAGNOSIS — Z79899 Other long term (current) drug therapy: Secondary | ICD-10-CM | POA: Diagnosis not present

## 2015-05-20 DIAGNOSIS — Z886 Allergy status to analgesic agent status: Secondary | ICD-10-CM | POA: Diagnosis not present

## 2015-05-20 DIAGNOSIS — N201 Calculus of ureter: Secondary | ICD-10-CM | POA: Diagnosis present

## 2015-05-20 DIAGNOSIS — R490 Dysphonia: Secondary | ICD-10-CM | POA: Diagnosis not present

## 2015-05-20 DIAGNOSIS — R109 Unspecified abdominal pain: Secondary | ICD-10-CM

## 2015-05-20 DIAGNOSIS — N134 Hydroureter: Secondary | ICD-10-CM | POA: Insufficient documentation

## 2015-05-20 HISTORY — PX: CYSTOSCOPY WITH STENT PLACEMENT: SHX5790

## 2015-05-20 HISTORY — DX: Unspecified asthma, uncomplicated: J45.909

## 2015-05-20 LAB — URINALYSIS COMPLETE WITH MICROSCOPIC (ARMC ONLY)
Bacteria, UA: NONE SEEN
Bilirubin Urine: NEGATIVE
GLUCOSE, UA: NEGATIVE mg/dL
Nitrite: NEGATIVE
PROTEIN: 100 mg/dL — AB
SPECIFIC GRAVITY, URINE: 1.025 (ref 1.005–1.030)
SQUAMOUS EPITHELIAL / LPF: NONE SEEN
pH: 5 (ref 5.0–8.0)

## 2015-05-20 LAB — COMPREHENSIVE METABOLIC PANEL
ALBUMIN: 4.7 g/dL (ref 3.5–5.0)
ALK PHOS: 90 U/L (ref 38–126)
ALT: 24 U/L (ref 17–63)
AST: 27 U/L (ref 15–41)
Anion gap: 8 (ref 5–15)
BUN: 15 mg/dL (ref 6–20)
CALCIUM: 9.6 mg/dL (ref 8.9–10.3)
CHLORIDE: 106 mmol/L (ref 101–111)
CO2: 28 mmol/L (ref 22–32)
CREATININE: 1.02 mg/dL (ref 0.61–1.24)
GFR calc non Af Amer: >60 mL/min (ref >60)
GLUCOSE: 146 mg/dL — AB (ref 65–99)
Potassium: 3.7 mmol/L (ref 3.5–5.1)
SODIUM: 142 mmol/L (ref 135–145)
Total Bilirubin: 0.3 mg/dL (ref 0.3–1.2)
Total Protein: 7.3 g/dL (ref 6.5–8.1)

## 2015-05-20 LAB — CBC
HCT: 43.9 % (ref 40.0–52.0)
Hemoglobin: 14.8 g/dL (ref 13.0–18.0)
MCH: 32.1 pg (ref 26.0–34.0)
MCHC: 33.6 g/dL (ref 32.0–36.0)
MCV: 95.3 fL (ref 80.0–100.0)
PLATELETS: 254 10*3/uL (ref 150–440)
RBC: 4.61 MIL/uL (ref 4.40–5.90)
RDW: 13.6 % (ref 11.5–14.5)
WBC: 9.4 10*3/uL (ref 3.8–10.6)

## 2015-05-20 LAB — LIPASE, BLOOD: Lipase: 27 U/L (ref 22–51)

## 2015-05-20 LAB — SURGICAL PCR SCREEN
MRSA, PCR: NEGATIVE
Staphylococcus aureus: NEGATIVE

## 2015-05-20 IMAGING — CT CT RENAL STONE PROTOCOL
3 of 4 series · 8 of 46 positions shown, 15 images · non-contrast
Comparison: None.

CLINICAL DATA: Left flank pain

EXAM:
CT ABDOMEN AND PELVIS WITHOUT CONTRAST
TECHNIQUE: Multidetector CT imaging of the abdomen and pelvis was performed
following the standard protocol without IV contrast.

[Series 4: lung · axial · 0.70mm/px · z∈[-66,+8]mm · 4 of 26 slices shown, 9 images]
[im 6/26  soft-tissue]
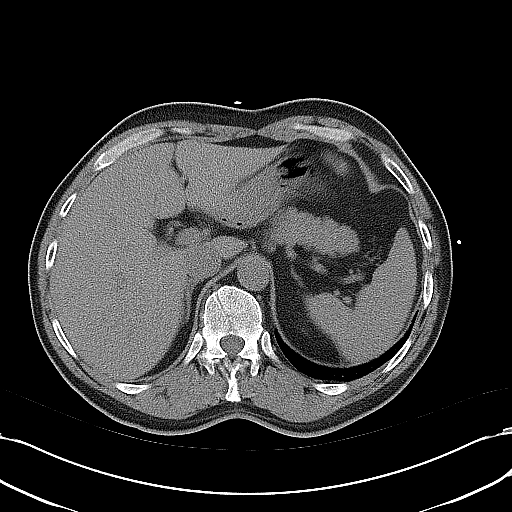
[im 6/26  lung]
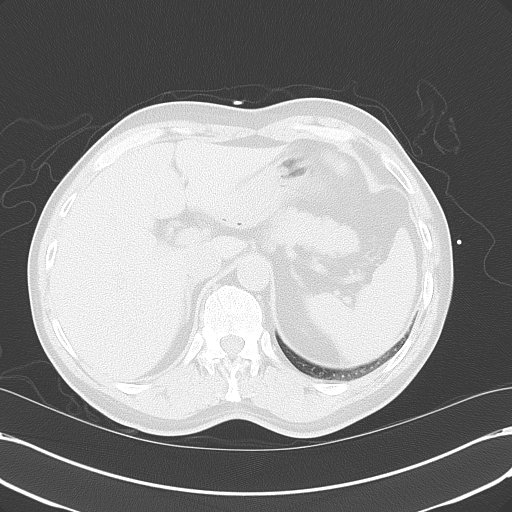
[im 6/26  bone]
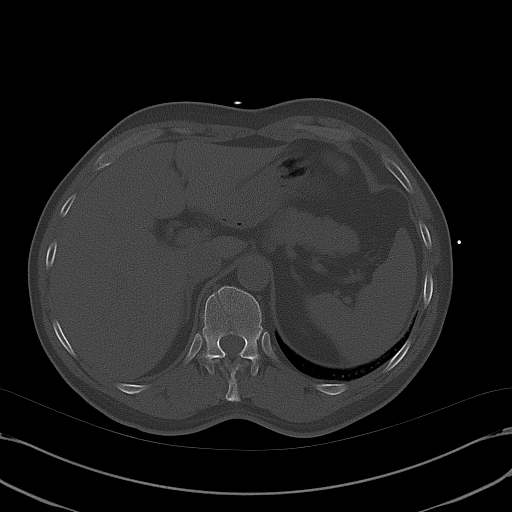
[im 11/26  soft-tissue]
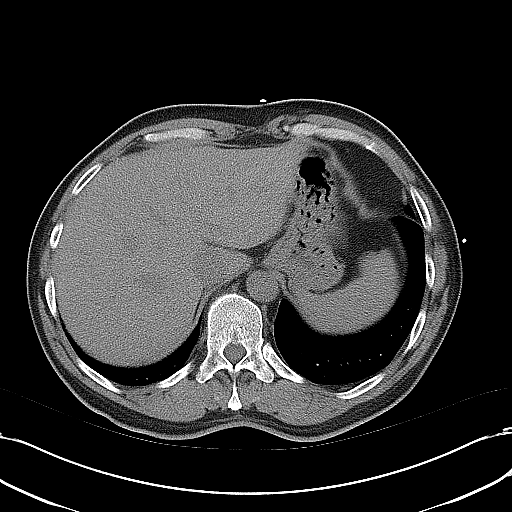
[im 11/26  lung]
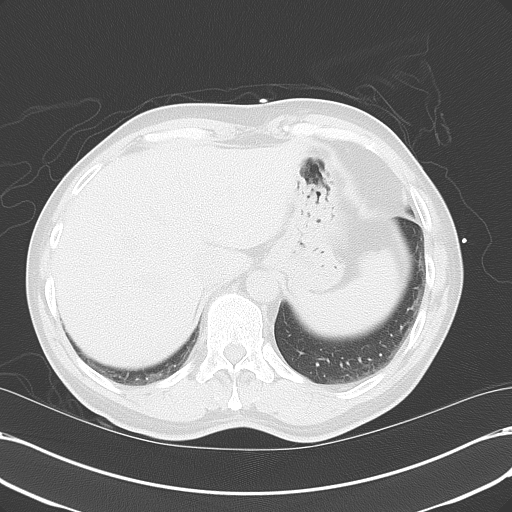
[im 16/26  soft-tissue]
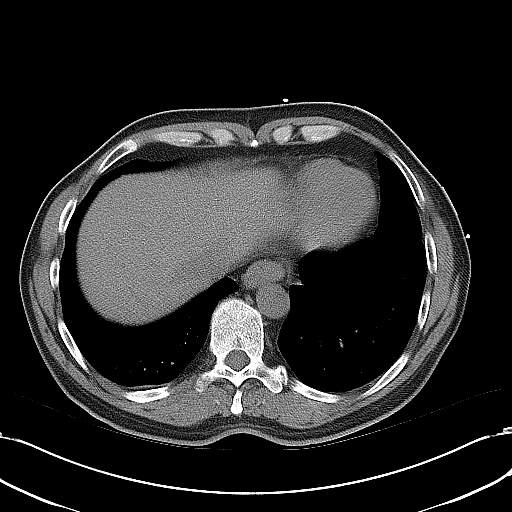
[im 16/26  lung]
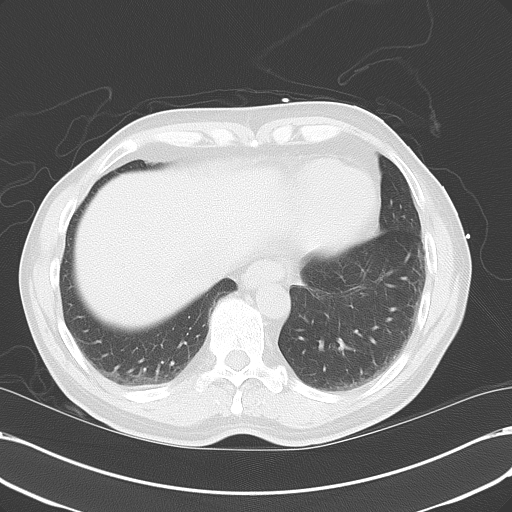
[im 21/26  soft-tissue]
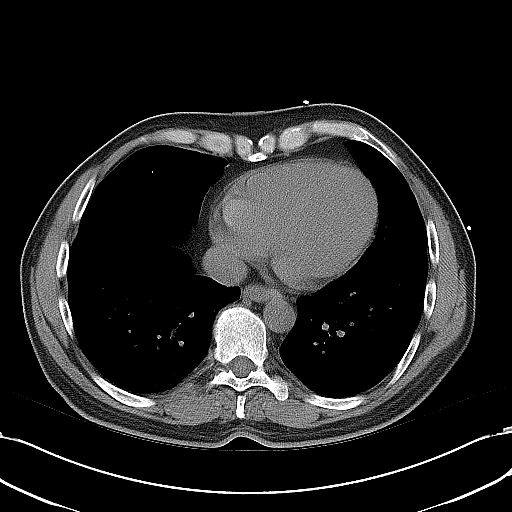
[im 21/26  lung]
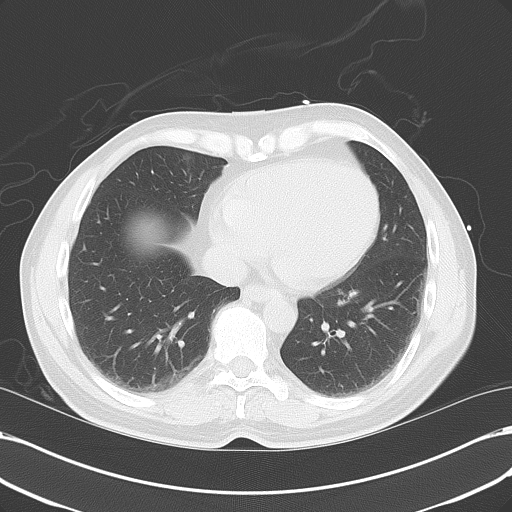

[Series 5: coronal · coronal · 0.73mm/px · 3 of 118 slices shown, 4 images]
[im 40/118  soft-tissue]
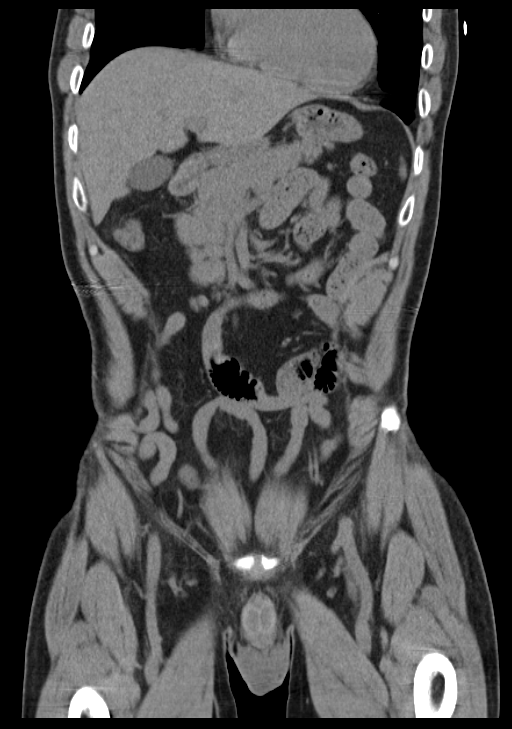
[im 53/118  soft-tissue]
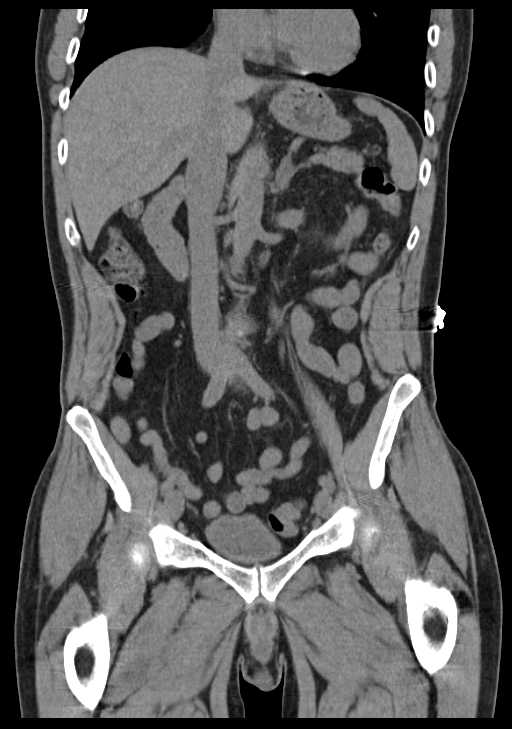
[im 53/118  bone]
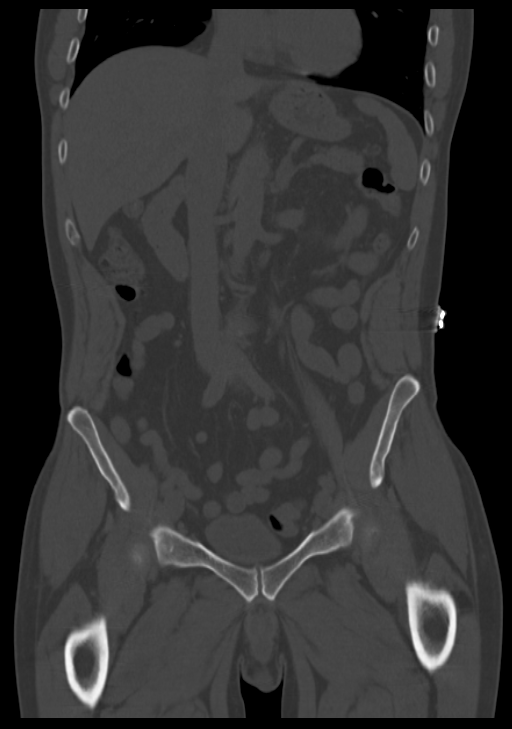
[im 66/118  soft-tissue]
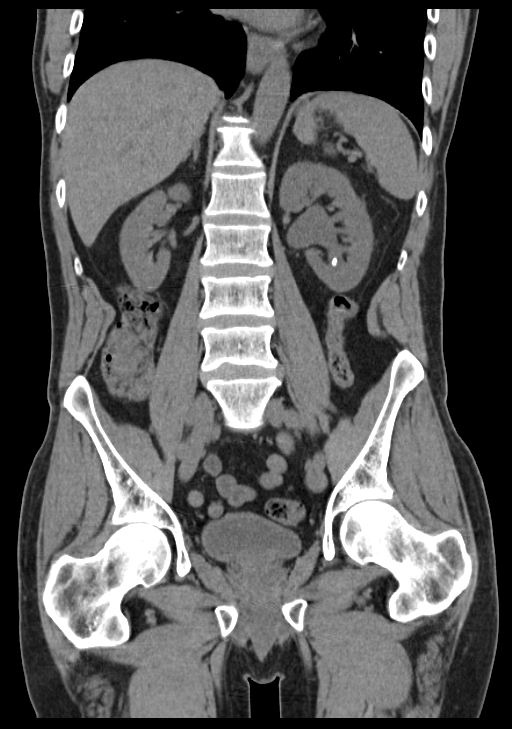

[Series 6: sagittal · sagittal · 0.53mm/px · 1 of 164 slices shown, 2 images]
[im 55/164  soft-tissue]
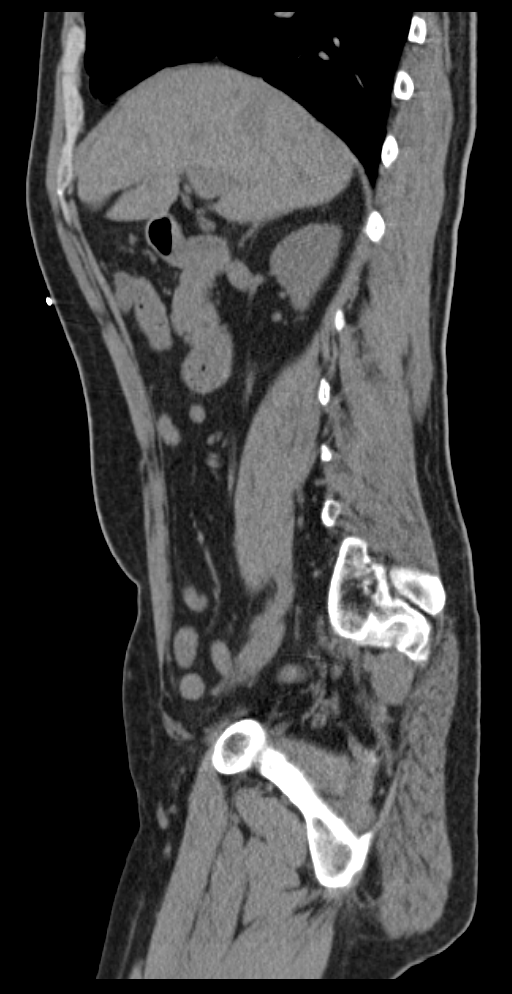
[im 55/164  bone]
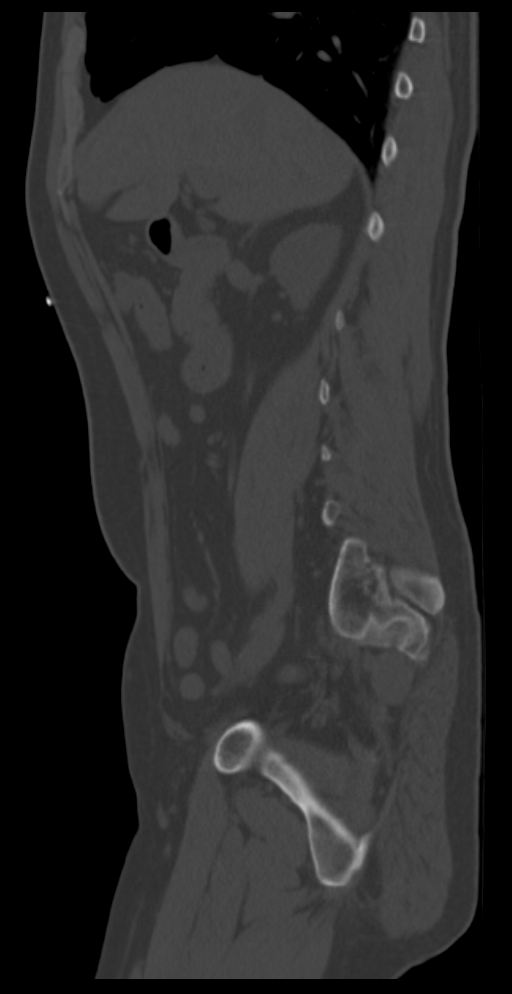

[8 of 46 positions shown; findings below may reference images not displayed]

FINDINGS: Lung bases are unremarkable. Sagittal images of the spine are
unremarkable. Unenhanced liver, pancreas, spleen and adrenal glands
are unremarkable. There is nonobstructive calculus in lower pole of
the left kidney measures 5 mm. Calcified obstructive calculus in
proximal left ureter at the level of lower endplate of L2 vertebral
body measures 10.7 mm. There is mild left hydronephrosis and
proximal left hydroureter. Mild left perinephric stranding. No right
ureteral calcifications. No calcified calculi are noted within
urinary bladder. Prostate gland and seminal vesicles are
unremarkable.

No small bowel obstruction. No aortic aneurysm. No pericecal
inflammation. Normal appendix noted in axial image 60. The terminal
ileum is unremarkable.

No ascites or free air.  No adenopathy.
IMPRESSION: 1. There is mild left hydronephrosis and left hydroureter. Left
perinephric stranding.
2. There is 10.7 mm calcified obstructive calculus in proximal left
ureter at the level of lower endplate of L2 vertebral body.
3. No small bowel obstruction.
4. Normal appendix.  No pericecal inflammation.

## 2015-05-20 IMAGING — RF ABDOMEN - 2 VIEW
4 series · 4 of 4 positions shown · non-contrast
Comparison: none

[Series 1: pädiatrie · 1 of 1 slices shown (1 of 4)]
[im 1/1]
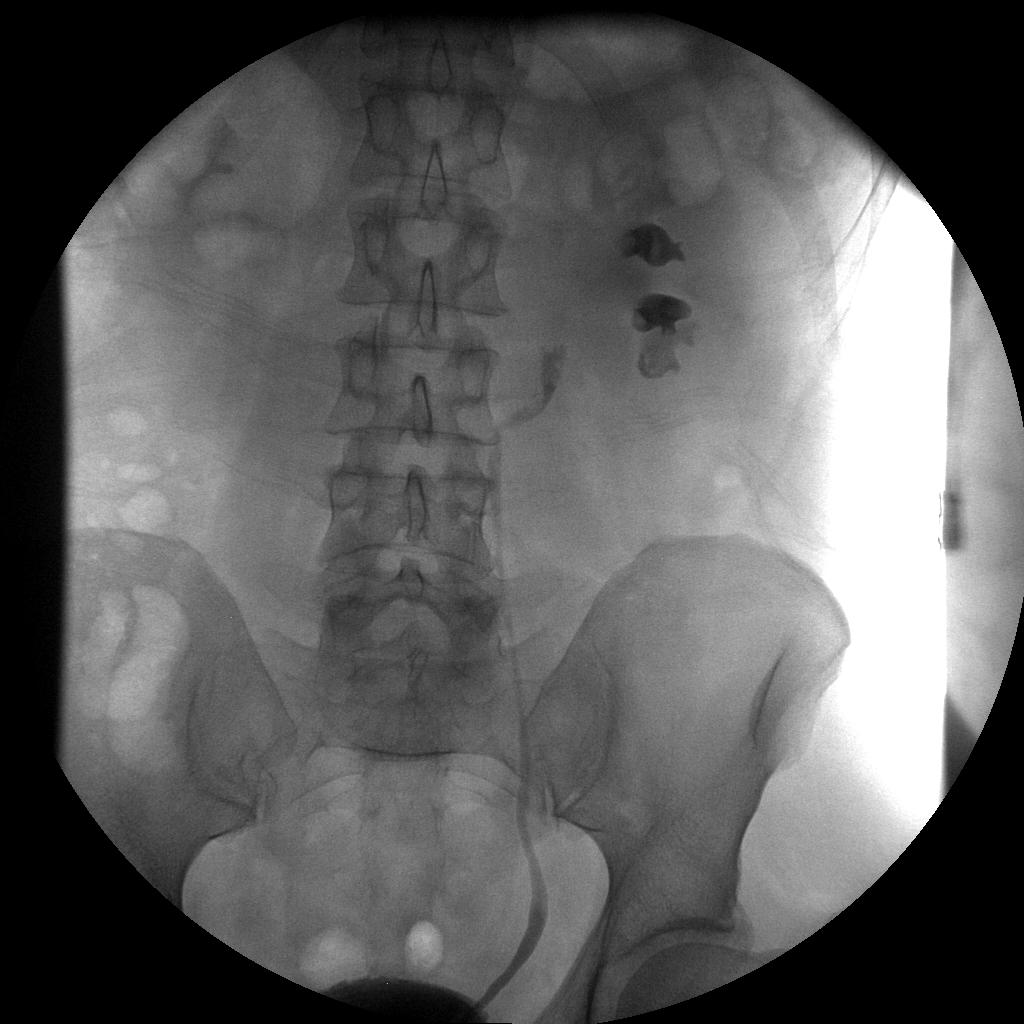

[Series 2: pädiatrie · 1 of 1 slices shown (2 of 4)]
[im 1/1]
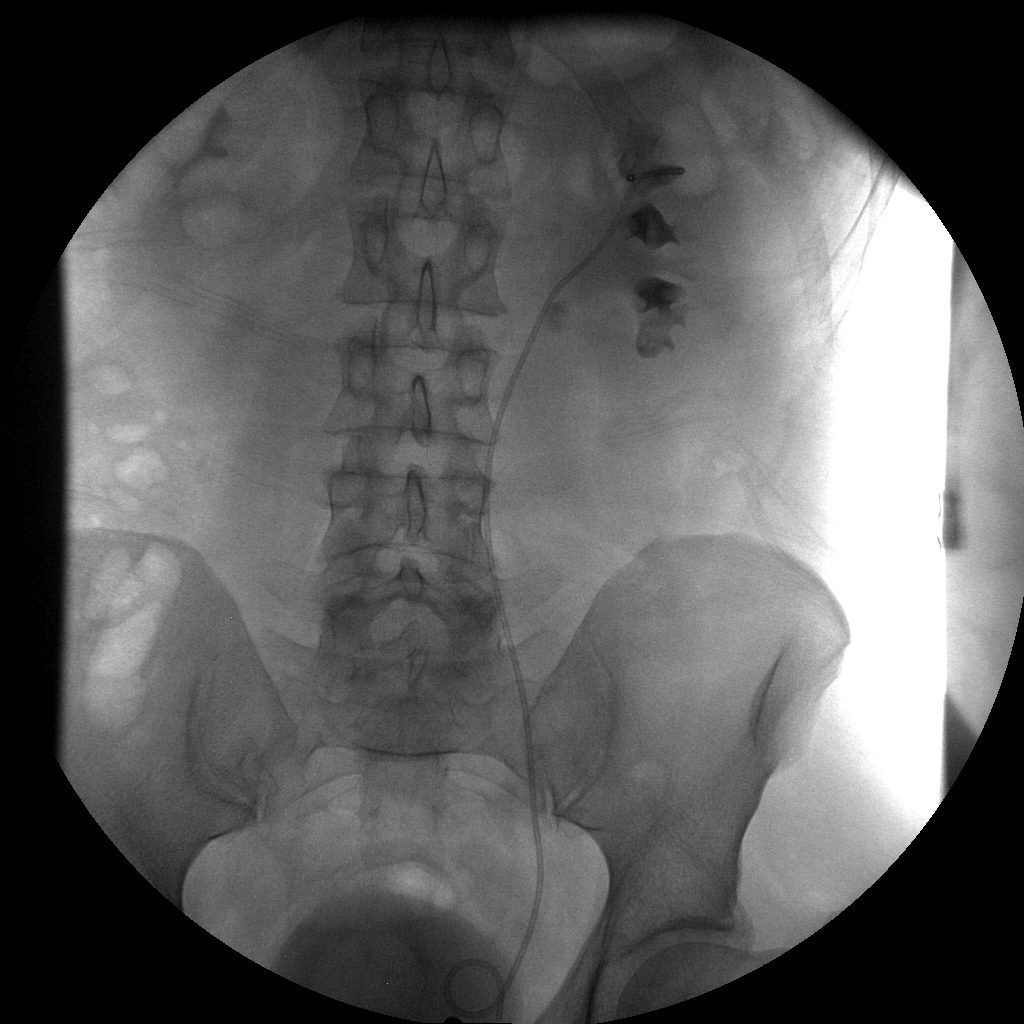

[Series 3: pädiatrie · 1 of 1 slices shown (3 of 4)]
[im 1/1]
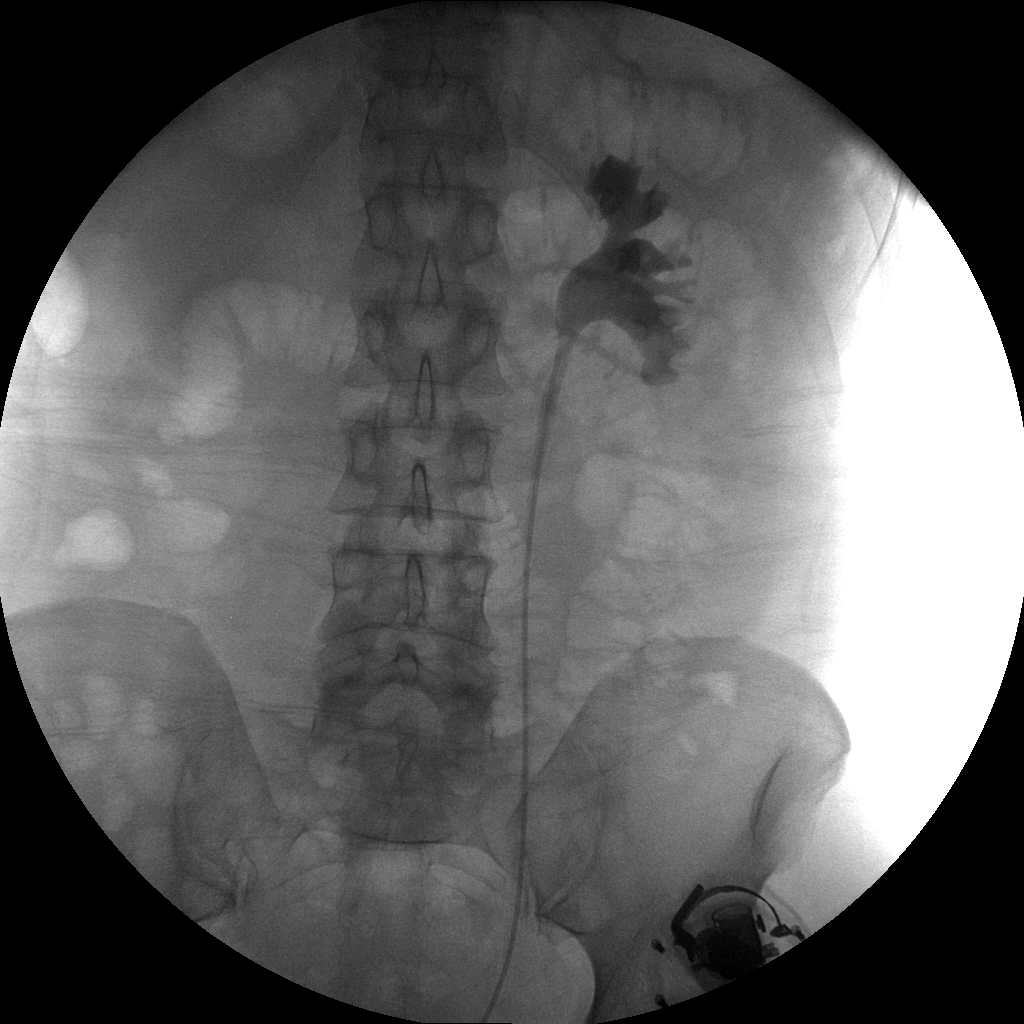

[Series 4: pädiatrie · 1 of 1 slices shown (4 of 4)]
[im 1/1]
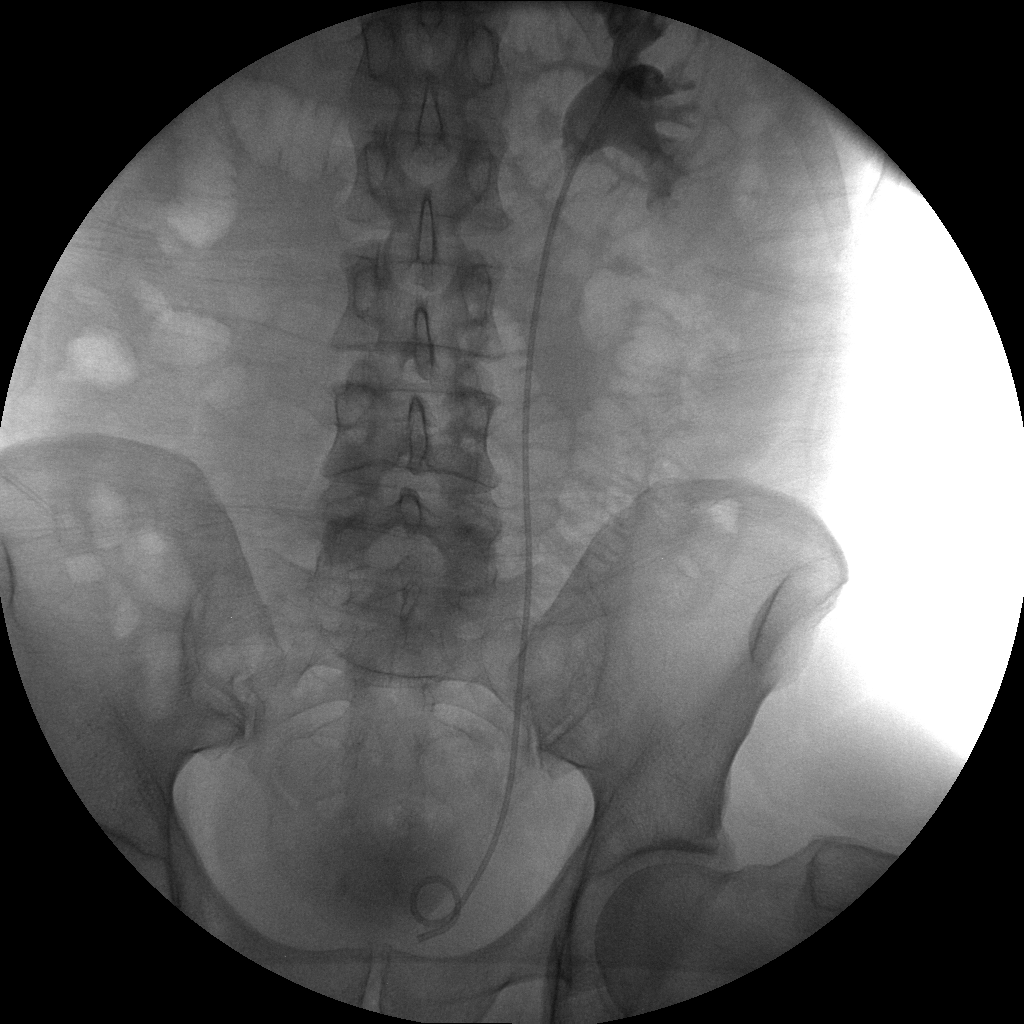

[4 of 4 positions shown; findings below may reference images not displayed]

Canned report from images found in remote index.

Refer to host system for actual result text.

## 2015-05-20 SURGERY — CYSTOSCOPY, WITH STENT INSERTION
Anesthesia: General | Site: Ureter | Laterality: Left | Wound class: Clean Contaminated

## 2015-05-20 MED ORDER — SODIUM CHLORIDE 0.9 % IV SOLN
1000.0000 mL | Freq: Once | INTRAVENOUS | Status: AC
  Administered 2015-05-20: 1000 mL via INTRAVENOUS

## 2015-05-20 MED ORDER — MIDAZOLAM HCL 2 MG/2ML IJ SOLN
INTRAMUSCULAR | Status: DC | PRN
  Administered 2015-05-20: 2 mg via INTRAVENOUS

## 2015-05-20 MED ORDER — OXYCODONE HCL 5 MG/5ML PO SOLN: 5.0000 mg | Freq: Once | ORAL | Status: DC | PRN

## 2015-05-20 MED ORDER — PROPOFOL 10 MG/ML IV BOLUS
INTRAVENOUS | Status: DC | PRN
  Administered 2015-05-20: 150 mg via INTRAVENOUS

## 2015-05-20 MED ORDER — ONDANSETRON HCL 4 MG/2ML IJ SOLN
4.0000 mg | Freq: Once | INTRAMUSCULAR | Status: AC
  Administered 2015-05-20: 4 mg via INTRAVENOUS
  Filled 2015-05-20: qty 2

## 2015-05-20 MED ORDER — IOTHALAMATE MEGLUMINE 43 % IV SOLN
INTRAVENOUS | Status: DC | PRN
  Administered 2015-05-20: 10 mL
  Administered 2015-05-20: 15 mL

## 2015-05-20 MED ORDER — OXYCODONE HCL 5 MG PO TABS: 5.0000 mg | ORAL_TABLET | Freq: Once | ORAL | Status: DC | PRN

## 2015-05-20 MED ORDER — GLYCOPYRROLATE 0.2 MG/ML IJ SOLN
INTRAMUSCULAR | Status: DC | PRN
  Administered 2015-05-20: .2 mg via INTRAVENOUS

## 2015-05-20 MED ORDER — FENTANYL CITRATE (PF) 100 MCG/2ML IJ SOLN
25.0000 ug | INTRAMUSCULAR | Status: DC | PRN
  Administered 2015-05-20: 25 ug via INTRAVENOUS

## 2015-05-20 MED ORDER — ACETAMINOPHEN 650 MG RE SUPP: 650.0000 mg | Freq: Four times a day (QID) | RECTAL | Status: DC | PRN

## 2015-05-20 MED ORDER — KETOROLAC TROMETHAMINE 30 MG/ML IJ SOLN
INTRAMUSCULAR | Status: DC | PRN
  Administered 2015-05-20: 30 mg via INTRAVENOUS

## 2015-05-20 MED ORDER — CEFAZOLIN SODIUM-DEXTROSE 2-3 GM-% IV SOLR
INTRAVENOUS | Status: DC | PRN
  Administered 2015-05-20: 2 g via INTRAVENOUS

## 2015-05-20 MED ORDER — HYDROCODONE-ACETAMINOPHEN 5-325 MG PO TABS: 1.0000 | ORAL_TABLET | ORAL | Status: DC | PRN

## 2015-05-20 MED ORDER — HYDROMORPHONE HCL 1 MG/ML IJ SOLN
2.0000 mg | INTRAMUSCULAR | Status: DC | PRN
  Administered 2015-05-20: 2 mg via INTRAVENOUS
  Filled 2015-05-20: qty 2

## 2015-05-20 MED ORDER — HYDROMORPHONE HCL 1 MG/ML IJ SOLN
1.0000 mg | Freq: Once | INTRAMUSCULAR | Status: AC
  Administered 2015-05-20: 1 mg via INTRAVENOUS
  Filled 2015-05-20: qty 1

## 2015-05-20 MED ORDER — FENTANYL CITRATE (PF) 100 MCG/2ML IJ SOLN
INTRAMUSCULAR | Status: DC | PRN
  Administered 2015-05-20: 100 ug via INTRAVENOUS

## 2015-05-20 MED ORDER — ONDANSETRON HCL 4 MG/2ML IJ SOLN
4.0000 mg | Freq: Four times a day (QID) | INTRAMUSCULAR | Status: DC | PRN
  Administered 2015-05-20: 4 mg via INTRAVENOUS

## 2015-05-20 MED ORDER — IPRATROPIUM-ALBUTEROL 0.5-2.5 (3) MG/3ML IN SOLN
3.0000 mL | Freq: Once | RESPIRATORY_TRACT | Status: AC
  Administered 2015-05-20: 3 mL via RESPIRATORY_TRACT

## 2015-05-20 MED ORDER — LACTATED RINGERS IV SOLN
INTRAVENOUS | Status: DC | PRN
  Administered 2015-05-20: 18:00:00 via INTRAVENOUS

## 2015-05-20 MED ORDER — ACETAMINOPHEN 325 MG PO TABS: 650.0000 mg | ORAL_TABLET | Freq: Four times a day (QID) | ORAL | Status: DC | PRN

## 2015-05-20 MED ORDER — ONDANSETRON HCL 4 MG PO TABS: 4.0000 mg | ORAL_TABLET | Freq: Four times a day (QID) | ORAL | Status: DC | PRN

## 2015-05-20 MED ORDER — OXYCODONE-ACETAMINOPHEN 10-325 MG PO TABS: 1.0000 | ORAL_TABLET | Freq: Four times a day (QID) | ORAL | Status: DC | PRN

## 2015-05-20 MED ORDER — SUCCINYLCHOLINE CHLORIDE 20 MG/ML IJ SOLN
INTRAMUSCULAR | Status: DC | PRN
  Administered 2015-05-20: 20 mg via INTRAVENOUS

## 2015-05-20 MED ORDER — TAMSULOSIN HCL 0.4 MG PO CAPS: 0.4000 mg | ORAL_CAPSULE | Freq: Every day | ORAL | Status: DC

## 2015-05-20 MED ORDER — OXYCODONE-ACETAMINOPHEN 5-325 MG PO TABS
ORAL_TABLET | ORAL | Status: AC
  Filled 2015-05-20: qty 1

## 2015-05-20 MED ORDER — ALBUTEROL SULFATE (2.5 MG/3ML) 0.083% IN NEBU: 2.5000 mg | INHALATION_SOLUTION | RESPIRATORY_TRACT | Status: DC | PRN

## 2015-05-20 SURGICAL SUPPLY — 18 items
BAG DRAIN CYSTO-URO LG1000N (MISCELLANEOUS) ×2 IMPLANT
CATH URETL 5X70 OPEN END (CATHETERS) ×2 IMPLANT
GLOVE BIO SURGEON STRL SZ 6.5 (GLOVE) ×2 IMPLANT
GLOVE BIO SURGEON STRL SZ7 (GLOVE) ×2 IMPLANT
GOWN STRL REUS W/ TWL LRG LVL3 (GOWN DISPOSABLE) ×1 IMPLANT
GOWN STRL REUS W/TWL LRG LVL3 (GOWN DISPOSABLE) ×2
JELLY LUB 2OZ STRL (MISCELLANEOUS) ×1
JELLY LUBE 2OZ STRL (MISCELLANEOUS) ×1 IMPLANT
PACK CYSTO AR (MISCELLANEOUS) ×2 IMPLANT
PREP PVP WINGED SPONGE (MISCELLANEOUS) ×2 IMPLANT
SENSORWIRE 0.038 NOT ANGLED (WIRE) ×2
SET CYSTO W/LG BORE CLAMP LF (SET/KITS/TRAYS/PACK) ×2 IMPLANT
SOL .9 NS 3000ML IRR  AL (IV SOLUTION) ×2
SOL .9 NS 3000ML IRR AL (IV SOLUTION) ×2
SOL .9 NS 3000ML IRR UROMATIC (IV SOLUTION) ×2 IMPLANT
STENT URET 6FRX26 CONTOUR (STENTS) ×2 IMPLANT
WATER STERILE IRR 1000ML POUR (IV SOLUTION) ×2 IMPLANT
WIRE SENSOR 0.038 NOT ANGLED (WIRE) ×1 IMPLANT

## 2015-05-20 NOTE — Discharge Instructions (Addendum)
DISCHARGE INSTRUCTIONS FOR KIDNEY STONE/URETERAL STENT   MEDICATIONS:  1.  Resume all your other meds from home - except do not take any extra narcotic pain meds that you may have at home.  2. Trospium is to prevent bladder spasms and help reduce urinary frequency. 3. Pyridium is to help with the burning/stinging when you urinate. 4. Tylenol with codeine is for moderate/severe pain, otherwise taking upto  every 6 hours of plainTylenol will help treat your pain.  Do not take both at the same time. 5. Take Cipro one hour prior to removal of your stent.   ACTIVITY:  1. No strenuous activity x 1week  2. No driving while on narcotic pain medications  3. Drink plenty of water  4. Continue to walk at home - you can still get blood clots when you are at home, so keep active, but don't over do it.  5. May return to work/school tomorrow or when you feel ready   BATHING:  1. You can shower and we recommend daily showers  2. You have a string coming from your urethra: The stent string is attached to your ureteral stent. Do not pull on this.   SIGNS/SYMPTOMS TO CALL:  Please call us if you have a fever greater than 101.5, uncontrolled nausea/vomiting, uncontrolled pain, dizziness, unable to urinate, bloody urine, chest pain, shortness of breath, leg swelling, leg pain, redness around wound, drainage from wound, or any other concerns or questions.   You can reach [place] at [phone].   FOLLOW-UP:  1. We will contact you with time and date of follow-up procedure within the next 1-2 business days.

## 2015-05-20 NOTE — ED Notes (Signed)
Hx of kidney stones, feels similar , nausea,

## 2015-05-20 NOTE — Progress Notes (Signed)
Patient tolerated the procedure well. Once the patient has recovered from anesthesia, he could be discharged any time thereafter. We will schedule him for follow-up early next week.

## 2015-05-20 NOTE — ED Notes (Signed)
Patient to ED with report of abdominal pain that started earlier in morning, patient further reports syncopal episode at work, and some bleeding with urination.

## 2015-05-20 NOTE — Progress Notes (Signed)
Patient admitted on unit.  VS stable, complained of mild pain, did not report any nausea or vomiting. Oriented to room and unit.

## 2015-05-20 NOTE — Anesthesia Preprocedure Evaluation (Addendum)
Anesthesia Evaluation  Patient identified by MRN, date of birth, ID band Patient awake    Reviewed: Allergy & Precautions, H&P , NPO status , Patient's Chart, lab work & pertinent test results  History of Anesthesia Complications Negative for: history of anesthetic complications  Airway Mallampati: II  TM Distance: >3 FB Neck ROM: full    Dental  (+) Chipped, Poor Dentition   Pulmonary asthma ,  breath sounds clear to auscultation  Pulmonary exam normal       Cardiovascular Exercise Tolerance: Good - Past MI negative cardio ROS Normal cardiovascular examRhythm:regular Rate:Normal     Neuro/Psych negative neurological ROS  negative psych ROS   GI/Hepatic negative GI ROS, Neg liver ROS,   Endo/Other  negative endocrine ROS  Renal/GU negative Renal ROS  negative genitourinary   Musculoskeletal   Abdominal   Peds  Hematology negative hematology ROS (+)   Anesthesia Other Findings Past Medical History:   Asthma                                                      Patient reports normal cardiac cath 5 years ago.  He also reports no problems with fentanyl or oxycodone.  Patient has hoarse voice at baseline  Reproductive/Obstetrics negative OB ROS                           Anesthesia Physical Anesthesia Plan  ASA: III  Anesthesia Plan: General LMA   Post-op Pain Management:    Induction:   Airway Management Planned:   Additional Equipment:   Intra-op Plan:   Post-operative Plan:   Informed Consent: I have reviewed the patients History and Physical, chart, labs and discussed the procedure including the risks, benefits and alternatives for the proposed anesthesia with the patient or authorized representative who has indicated his/her understanding and acceptance.   Dental Advisory Given  Plan Discussed with: Anesthesiologist, CRNA and Surgeon  Anesthesia Plan Comments:          Anesthesia Quick Evaluation

## 2015-05-20 NOTE — Progress Notes (Signed)
I have spoken with Dr. Cyril Loosen and reviewed the patient's chart. The patient has a 10 mm proximal left ureteral stone with poorly controlled pain. I have also spoken to the operating room and scheduled the patient for cystoscopy and a left ureteral stent placement.  It has tentatively been scheduled for 5:30 PM. The patient should be able to be discharged following this procedure. I will see the patient and do a more complete consultation prior to the procedure.

## 2015-05-20 NOTE — ED Notes (Signed)
MD at bedside. Dr.Kinner 

## 2015-05-20 NOTE — Progress Notes (Signed)
Pt d/c'd to home today. IV removed & intact. [name] discharge paperwork reviewed with all questions & concerns addressed. Rx's handed to pt and medication education reviewed with all questions & concerns addressed. Infection prevention education presented with all questions and concern addressed. [name] mother at bedside and called family member to transport them home. Tech utilized to transport pt downstairs via wheelchair.

## 2015-05-20 NOTE — H&P (Signed)
[facility] [place] [facility]   PATIENT NAME: Shawn [name]    MR#:  [id number]  DATE OF BIRTH:  [birth year]  DATE OF ADMISSION:  05/20/2015  PRIMARY CARE PHYSICIAN: No PCP Per Patient   REQUESTING/REFERRING PHYSICIAN: Dr. Cyril Loosen  CHIEF COMPLAINT:   Chief Complaint  Patient presents with  . Shortness of Breath  . Abdominal Pain  . Loss of Consciousness    HISTORY OF PRESENT ILLNESS:  Shawn [name]  is a 54 y.o. male with a known history of asthma and nephrolithiasis presents to the emergency room complaining of acute onset of left flank pain. He mentions that this feels similar to the last time he had a kidney stone removed. Patient has noticed some hematuria and dysuria. Some nausea but no vomiting. No diarrhea or constipation. Has received IV pain medications in the emergency room and feels mildly improved. He had similar complaints many years back when he had a ureteral stent placed in [place], [place]. Today on the CT scan he has been found to have 10 mm left proximal ureteral stone with mild left hydroureter and hydronephrosis. Afebrile. Normal WBC. Urinalysis pending.  PAST MEDICAL HISTORY:   Past Medical History  Diagnosis Date  . Asthma     PAST SURGICAL HISTORY:  History reviewed. No pertinent past surgical history.  SOCIAL HISTORY:   Social History  Substance Use Topics  . Smoking status: Never Smoker   . Smokeless tobacco: Not on file  . Alcohol Use: 0.0 oz/week    0 Standard drinks or equivalent per week     Comment: occasionally    FAMILY HISTORY:   Family History  Problem Relation Age of Onset  . Cancer Mother   . Heart failure Mother   . Hypertension Mother   . CVA Mother   . Diabetes Other     DRUG ALLERGIES:   Allergies  Allergen Reactions  . Aspirin   . Morphine And Related   . Naprosyn [Naproxen]   . Penicillins     REVIEW OF SYSTEMS:   Review of Systems  Constitutional: Positive for malaise/fatigue.  Negative for fever, chills and weight loss.  HENT: Negative for hearing loss and nosebleeds.   Eyes: Negative for blurred vision, double vision and pain.  Respiratory: Negative for cough, hemoptysis, sputum production, shortness of breath and wheezing.   Cardiovascular: Negative for chest pain, palpitations, orthopnea and leg swelling.  Gastrointestinal: Positive for abdominal pain. Negative for nausea, vomiting, diarrhea and constipation.  Genitourinary: Positive for dysuria, hematuria and flank pain.  Musculoskeletal: Negative for myalgias, back pain and falls.  Skin: Negative for rash.  Neurological: Positive for weakness. Negative for dizziness, tremors, sensory change, speech change, focal weakness, seizures and headaches.  Endo/Heme/Allergies: Does not bruise/bleed easily.  Psychiatric/Behavioral: Negative for depression and memory loss. The patient is not nervous/anxious.     MEDICATIONS AT HOME:   Prior to Admission medications   Medication Sig Start Date End Date Taking? Authorizing Provider  oxyCODONE-acetaminophen (PERCOCET) 10-325 MG per tablet Take 1 tablet by mouth every 6 (six) hours as needed for pain. 05/20/15   Milagros Loll, MD  tamsulosin (FLOMAX) 0.4 MG CAPS capsule Take 1 capsule (0.4 mg total) by mouth daily after supper. 05/20/15   Isacc Turney, MD      VITAL SIGNS:  Blood pressure 141/72, pulse 62, resp. rate 13, SpO2 100 %.  PHYSICAL EXAMINATION:  Physical Exam  GENERAL:  55 y.o.-year-old patient lying in the bed with no acute  distress.  EYES: Pupils equal, round, reactive to light and accommodation. No scleral icterus. Extraocular muscles intact.  HEENT: Head atraumatic, normocephalic. Oropharynx and nasopharynx clear. No oropharyngeal erythema, moist oral mucosa  NECK:  Supple, no jugular venous distention. No thyroid enlargement, no tenderness.  LUNGS: Normal breath sounds bilaterally, no wheezing, rales, rhonchi. No use of accessory muscles of  respiration.  CARDIOVASCULAR: S1, S2 normal. No murmurs, rubs, or gallops.  ABDOMEN: Soft. Tenderness in the left abdomen and left CVA area. Normal bowel sounds. No suprapubic fullness or tenderness. EXTREMITIES: No pedal edema, cyanosis, or clubbing. + 2 pedal & radial pulses b/l.   NEUROLOGIC: Cranial nerves II through XII are intact. No focal Motor or sensory deficits appreciated b/l PSYCHIATRIC: The patient is alert and oriented x 3. Good affect.  SKIN: No obvious rash, lesion, or ulcer.   LABORATORY PANEL:   CBC  Recent Labs Lab 05/20/15 0952  WBC 9.4  HGB 14.8  HCT 43.9  PLT 254   ------------------------------------------------------------------------------------------------------------------  Chemistries   Recent Labs Lab 05/20/15 0952  NA 142  K 3.7  CL 106  CO2 28  GLUCOSE 146*  BUN 15  CREATININE 1.02  CALCIUM 9.6  AST 27  ALT 24  ALKPHOS 90  BILITOT 0.3   ------------------------------------------------------------------------------------------------------------------  Cardiac Enzymes No results for input(s): TROPONINI in the last 168 hours. ------------------------------------------------------------------------------------------------------------------  RADIOLOGY:  Ct Renal Stone Study  05/20/2015   CLINICAL DATA:  Left flank pain  EXAM: CT ABDOMEN AND PELVIS WITHOUT CONTRAST  TECHNIQUE: Multidetector CT imaging of the abdomen and pelvis was performed following the standard protocol without IV contrast.  COMPARISON:  None.  FINDINGS: Lung bases are unremarkable. Sagittal images of the spine are unremarkable. Unenhanced liver, pancreas, spleen and adrenal glands are unremarkable. There is nonobstructive calculus in lower pole of the left kidney measures 5 mm. Calcified obstructive calculus in proximal left ureter at the level of lower endplate of L2 vertebral body measures 10.7 mm. There is mild left hydronephrosis and proximal left hydroureter. Mild left  perinephric stranding. No right ureteral calcifications. No calcified calculi are noted within urinary bladder. Prostate gland and seminal vesicles are unremarkable.  No small bowel obstruction. No aortic aneurysm. No pericecal inflammation. Normal appendix noted in axial image 60. The terminal ileum is unremarkable.  No ascites or free air.  No adenopathy.  IMPRESSION: 1. There is mild left hydronephrosis and left hydroureter. Left perinephric stranding. 2. There is 10.7 mm calcified obstructive calculus in proximal left ureter at the level of lower endplate of L2 vertebral body. 3. No small bowel obstruction. 4. Normal appendix.  No pericecal inflammation.   Electronically Signed   By: Natasha Mead M.D.   On: 05/20/2015 11:13     IMPRESSION AND PLAN:   * Left ureteral stone 10 mm. Patient has significant pain needing IV pain medications. At this point urology has been consult and patient has been scheduled for ureteral stent. Patient will be admitted under observation. Discharge once his procedures uncomplicated and pain is controlled. Keep patient nothing by mouth. Patient is low risk for procedure. Urinalysis is pending. Afebrile. Normal white count. Mild left hydronephrosis and left hydroureter  * Asthma is stable.   All the records are reviewed and case discussed with ED provider. Management plans discussed with the patient, family and they are in agreement.  CODE STATUS: FULL  TOTAL TIME TAKING CARE OF THIS PATIENT: 40 minutes.    Milagros Loll R M.D on 05/20/2015 at 12:20 PM  Between 7am to 6pm - Pager - [phone]  After 6pm go to [email or web address] - password [email or web address] [facility]  [facility]  Office  [phone]  CC: Primary care physician; No PCP Per Patient

## 2015-05-20 NOTE — H&P (Signed)
I have been asked to see the patient by Dr. Milagros Loll, for evaluation and management of left proximal ureteral stone.  History of present illness: 54 year old [name] who presented to the emergency department today with 12 hours of progressive left-sided abdominal and flank pain. At the time of the presentation the patient had associated nausea and vomiting. He had 10/10 pain. The patient thought that he might have noted some hematuria. He had no dysuria. He has not had any fevers or chills. He has not had any progressive voiding symptoms. In the emergency department a CT scan was performed demonstrating a 11 mm stone in the left proximal ureter with associated hydronephrosis. The patient's pain was difficult to control, and as such he was admitted to the hospitalist service with a plan for urgent ureteral stent placement. The patient has a past medical history significant for nephrolithiasis. He has been treated for kidney stones twice in the past. Each time, the patient has had a ureteral stent. The patient's specimen was removed also significant for asthma. The patient lives alone, family lives in [place], patient himself lives in [place]. He prefers to be treated here in [place] and/or [place].  Review of systems: A 12 point comprehensive review of systems was obtained and is negative unless otherwise stated in the history of present illness.  Patient Active Problem List   Diagnosis Date Noted  . Ureteral stone 05/20/2015    No current facility-administered medications on file prior to encounter.   No current outpatient prescriptions on file prior to encounter.    Past Medical History  Diagnosis Date  . Asthma     History reviewed. No pertinent past surgical history.  Social History  Substance Use Topics  . Smoking status: Never Smoker   . Smokeless tobacco: None  . Alcohol Use: 0.0 oz/week    0 Standard drinks or equivalent per week     Comment:  occasionally    Family History  Problem Relation Age of Onset  . Cancer Mother   . Heart failure Mother   . Hypertension Mother   . CVA Mother   . Diabetes Other     PE: Filed Vitals:   05/20/15 1030 05/20/15 1230 05/20/15 1323  BP: 141/72 110/68 105/63  Pulse: 62 Shawn 53  Temp:   97.8 F (36.6 C)  TempSrc:   Oral  Resp: 13 19   SpO2: 100% 97% 99%   Patient appears to be in no acute distress  patient is alert and oriented x3 Atraumatic normocephalic head No cervical or supraclavicular lymphadenopathy appreciated No increased work of breathing, no audible wheezes/rhonchi Regular sinus rhythm/rate Abdomen is soft, nontender, nondistended, no CVA or suprapubic tenderness Lower extremities are symmetric without appreciable edema Grossly neurologically intact No identifiable skin lesions   Recent Labs  05/20/15 0952  WBC 9.4  HGB 14.8  HCT 43.9    Recent Labs  05/20/15 0952  NA 142  K 3.7  CL 106  CO2 28  GLUCOSE 146*  BUN 15  CREATININE 1.02  CALCIUM 9.6   No results for input(s): LABPT, INR in the last 72 hours. No results for input(s): LABURIN in the last 72 hours. No results found for this or any previous visit.  Imaging: I've independently reviewed the patient's CT scan, stone protocol. This demonstrates an 11 mm stone in the proximal ureter. He has associated hydronephrosis. He also has 2 nonobstructing stones on the left. The patient has no stones on the right. There are  no other significant abnormalities.  Imp: The patient has an 11 mm stone in the proximal ureter, his pain is difficult to control without IV pain medications. As such, the patient has opted to proceed to the operating room for an urgent ureteral stent placement.  Recommendations: After going to the treatment options with the patient he has opted for stent placement tonight. I discussed the surgery for the patient detail. He understands that this will require an additional procedure to  remove the stone. He also understands the side effects of an indwelling stent. The patient will be followed closely and scheduled for definitive stone expulsion in the next week.  Berniece Salines W

## 2015-05-20 NOTE — Op Note (Signed)
Preoperative diagnosis:  1. Left proximal ureteral stone   Postoperative diagnosis:  1. same   Procedure:  1. Cystoscopy 2. left ureteral stent placement 3. left retrograde pyelography with interpretation   Surgeon: Crist Fat, MD  Anesthesia: General  Complications: None  Intraoperative findings: normal caliber ureter until the proximal ureter where a large filling defect was seen consistent with the patient's known stone. There was proximal hydronephrosis. A 26 cm 6 French double-J stent was placed in the left ureter without complication.  EBL: Minimal  Specimens: None  Indication: Shawn [name] is a 54 y.o. patient with left proximal ureteral stone with associated hydronephrosis and poorly controlled pain. After reviewing the management options for treatment, he elected to proceed with the above surgical procedure(s). We have discussed the potential benefits and risks of the procedure, side effects of the proposed treatment, the likelihood of the patient achieving the goals of the procedure, and any potential problems that might occur during the procedure or recuperation. Informed consent has been obtained.  Description of procedure:  The patient was taken to the operating room and general anesthesia was induced.  The patient was placed in the dorsal lithotomy position, prepped and draped in the usual sterile fashion, and preoperative antibiotics were administered. A preoperative time-out was performed.   Cystourethroscopy was performed.  The patient's urethra was examined and demonstrated bilobar prostatic hypertrophy. The bladder was then systematically examined in its entirety. There was no evidence for any bladder tumors, stones, or other mucosal pathology.    Attention then turned to the leftureteral orifice and a ureteral catheter was used to intubate the ureteral orifice.  Omnipaque contrast was injected through the ureteral catheter and a retrograde pyelogram was  performed with findings as dictated above.  A 0.38 sensor guidewire was then advanced up the left ureter into the renal pelvis under fluoroscopic guidance.  The wire was then backloaded through the cystoscope and a ureteral stent was advance over the wire using Seldinger technique.  The stent was positioned appropriately under fluoroscopic and cystoscopic guidance.  The wire was then removed with an adequate stent curl noted in the renal pelvis as well as in the bladder.  The bladder was then emptied and the procedure ended.  The patient appeared to tolerate the procedure well and without complications.  The patient was able to be awakened and transferred to the recovery unit in satisfactory condition.    Crist Fat, M.D.

## 2015-05-20 NOTE — Anesthesia Postprocedure Evaluation (Signed)
  Anesthesia Post-op Note  Patient: Shawn [name]  Procedure(s) Performed: Procedure(s): CYSTOSCOPY WITH STENT PLACEMENT (Left)  Anesthesia type:General LMA  Patient location: PACU  Post pain: Pain level controlled  Post assessment: Post-op Vital signs reviewed, Patient's Cardiovascular Status Stable, Respiratory Function Stable, Patent Airway and No signs of Nausea or vomiting  Post vital signs: Reviewed and stable  Last Vitals:  Filed Vitals:   05/20/15 1932  BP:   Pulse: 70  Temp: 36.6 C  Resp:     Level of consciousness: awake, alert  and patient cooperative  Complications: No apparent anesthesia complications

## 2015-05-20 NOTE — Anesthesia Procedure Notes (Signed)
Procedure Name: LMA Insertion Date/Time: 05/20/2015 5:56 PM Performed by: Shirlee Limerick, Kimmi Acocella Pre-anesthesia Checklist: Patient identified, Emergency Drugs available, Suction available and Patient being monitored Patient Re-evaluated:Patient Re-evaluated prior to inductionOxygen Delivery Method: Circle system utilized Preoxygenation: Pre-oxygenation with 100% oxygen Intubation Type: IV induction Ventilation: Mask ventilation without difficulty LMA: LMA inserted LMA Size: 5.0 Number of attempts: 1

## 2015-05-20 NOTE — Progress Notes (Signed)
Pt transferred from PACU to room 214 at 1930 s/p L ureteral stent placement. Pt A&O with no s/s distress at this time. IV intact and infusing. VSS. Pt denies pain. Pt up to the bathroom with assist. Family at bedside. New orders for regular diet & saline lock per Dr. Marlou Porch.

## 2015-05-20 NOTE — Transfer of Care (Signed)
Immediate Anesthesia Transfer of Care Note  Patient: Shawn [name]  Procedure(s) Performed: Procedure(s): CYSTOSCOPY WITH STENT PLACEMENT (Left)  Patient Location: PACU  Anesthesia Type:General  Level of Consciousness: sedated and patient cooperative  Airway & Oxygen Therapy: Patient Spontanous Breathing and Patient connected to nasal cannula oxygen  Post-op Assessment: Report given to RN and Post -op Vital signs reviewed and stable  Post vital signs: Reviewed and stable  Last Vitals:  Filed Vitals:   05/20/15 1825  BP: 101/62  Pulse: 75  Temp: 36.2 C  Resp: 11    Complications: No apparent anesthesia complications

## 2015-05-20 NOTE — ED Provider Notes (Signed)
[facility] Emergency Department Provider Note  ____________________________________________  Time seen: On arrival  I have reviewed the triage vital signs and the nursing notes.   HISTORY  Chief Complaint Flank pain   HPI Shawn [name] is a 54 y.o. male who presents with complaints of severe left flank pain that started overnight. He reports a history of kidney stones and this feels similar. He says the pain was so severe that it was interfering with his breathing but he feels that his breathing is normal now. Earlier it was so severe he thought he would black out but he did not. He denies hematuria. No dysuria. No fevers no chills. The pain as sharp and cramping in nature     Past Medical History  Diagnosis Date  . Asthma     There are no active problems to display for this patient.   History reviewed. No pertinent past surgical history.  No current outpatient prescriptions on file.  Allergies Aspirin; Morphine and related; Naprosyn; and Penicillins  History reviewed. No pertinent family history.  Social History Social History  Substance Use Topics  . Smoking status: Never Smoker   . Smokeless tobacco: None  . Alcohol Use: Yes     Comment: occasionally    Review of Systems  Constitutional: Negative for fever. Eyes: Negative for visual changes. ENT: Negative for sore throat Cardiovascular: Negative for chest pain. Respiratory: Negative for shortness of breath. Gastrointestinal: Positive for flank pain Genitourinary: Negative for dysuria. Musculoskeletal: Negative for back pain. Skin: Negative for rash. Neurological: Negative for headaches or focal weakness Psychiatric: Mild anxiety    ____________________________________________   PHYSICAL EXAM:  VITAL SIGNS: ED Triage Vitals  Enc Vitals Group     BP --      Pulse --      Resp --      Temp --      Temp src --      SpO2 --      Weight --      Height --      Head  Cir --      Peak Flow --      Pain Score 05/20/15 0947 10     Pain Loc --      Pain Edu? --      Excl. in GC? --      Constitutional: Alert and oriented. Appears in Significant pain Eyes: Conjunctivae are normal.  ENT   Head: Normocephalic and atraumatic.   Mouth/Throat: Mucous membranes are moist. Cardiovascular: Normal rate, regular rhythm. Normal and symmetric distal pulses are present in all extremities. No murmurs, rubs, or gallops. Respiratory: Normal respiratory effort without tachypnea nor retractions. Breath sounds are clear and equal bilaterally.  Gastrointestinal: Soft and non-tender in all quadrants. No distention. There is no CVA tenderness. Genitourinary: deferred Musculoskeletal: Nontender with normal range of motion in all extremities. No lower extremity tenderness nor edema. Neurologic:  Normal speech and language. No gross focal neurologic deficits are appreciated. Skin:  Skin is warm, dry and intact. No rash noted. Psychiatric: Mood and affect are normal. Patient exhibits appropriate insight and judgment.  ____________________________________________    LABS (pertinent positives/negatives)  Labs Reviewed  CBC  LIPASE, BLOOD  COMPREHENSIVE METABOLIC PANEL  URINALYSIS COMPLETEWITH MICROSCOPIC ([facility] ONLY)    ____________________________________________   EKG  ED ECG REPORT I, Jene Every, the attending physician, personally viewed and interpreted this ECG.  Date: 05/20/2015 EKG Time: 10:10 AM Rate: 52 Rhythm: Sinus bradycardia QRS Axis: normal Intervals:  normal ST/T Wave abnormalities: Nonspecific Conduction Disutrbances: none Narrative Interpretation: unremarkable   ____________________________________________    RADIOLOGY I have personally reviewed any xrays that were ordered on this patient:  CT shows 11 mm proximal stone on the left  ____________________________________________   PROCEDURES  Procedure(s) performed:  none  Critical Care performed: none  ____________________________________________   INITIAL IMPRESSION / ASSESSMENT AND PLAN / ED COURSE  Pertinent labs & imaging results that were available during my care of the patient were reviewed by me and considered in my medical decision making (see chart for details).  Patient history of present illness is most consistent with a kidney stone. We'll we will give fluids, analgesic's IV sent for CT renal study and reevaluate  ----------------------------------------- 12:03 PM on 05/20/2015 -----------------------------------------  Patient with significant pain, left millimeter kidney stone. Discussed with urology Dr. Marlou Porch who will put a stent in the patient later this evening and he asked that I admit to internal medicine currently.  Patient has received 2 doses of IV Dilaudid and continues to have discomfort  ____________________________________________   FINAL CLINICAL IMPRESSION(S) / ED DIAGNOSES  Final diagnoses:  Flank pain, acute  Kidney stone     Jene Every, MD 05/20/15 1218

## 2015-05-21 ENCOUNTER — Encounter (HOSPITAL_COMMUNITY): Payer: Self-pay | Admitting: *Deleted

## 2015-05-21 ENCOUNTER — Other Ambulatory Visit: Payer: Self-pay | Admitting: Urology

## 2015-05-24 ENCOUNTER — Ambulatory Visit (HOSPITAL_COMMUNITY)
Admission: RE | Admit: 2015-05-24 | Discharge: 2015-05-24 | Disposition: A | Discharge status: Home / Self Care | Payer: BLUE CROSS/BLUE SHIELD | Source: Ambulatory Visit | Attending: Urology | Admitting: Urology

## 2015-05-24 ENCOUNTER — Encounter (HOSPITAL_COMMUNITY)
Admission: RE | Disposition: A | Discharge status: Home / Self Care | Payer: Self-pay | Source: Ambulatory Visit | Attending: Urology

## 2015-05-24 ENCOUNTER — Encounter (HOSPITAL_COMMUNITY): Payer: Self-pay | Admitting: General Practice

## 2015-05-24 ENCOUNTER — Ambulatory Visit (HOSPITAL_COMMUNITY): Payer: BLUE CROSS/BLUE SHIELD

## 2015-05-24 DIAGNOSIS — J45909 Unspecified asthma, uncomplicated: Secondary | ICD-10-CM | POA: Insufficient documentation

## 2015-05-24 DIAGNOSIS — N201 Calculus of ureter: Secondary | ICD-10-CM

## 2015-05-24 DIAGNOSIS — N200 Calculus of kidney: Secondary | ICD-10-CM | POA: Insufficient documentation

## 2015-05-24 DIAGNOSIS — R109 Unspecified abdominal pain: Secondary | ICD-10-CM | POA: Diagnosis present

## 2015-05-24 DIAGNOSIS — Z79899 Other long term (current) drug therapy: Secondary | ICD-10-CM | POA: Diagnosis not present

## 2015-05-24 HISTORY — DX: Calculus of kidney: N20.0

## 2015-05-24 IMAGING — CR DG ABDOMEN 1V
1 series · 1 of 1 positions shown · non-contrast
Comparison: CT [DATE].

CLINICAL DATA: Left ureteral stone.

EXAM:
ABDOMEN - 1 VIEW

[t abdomen supine]
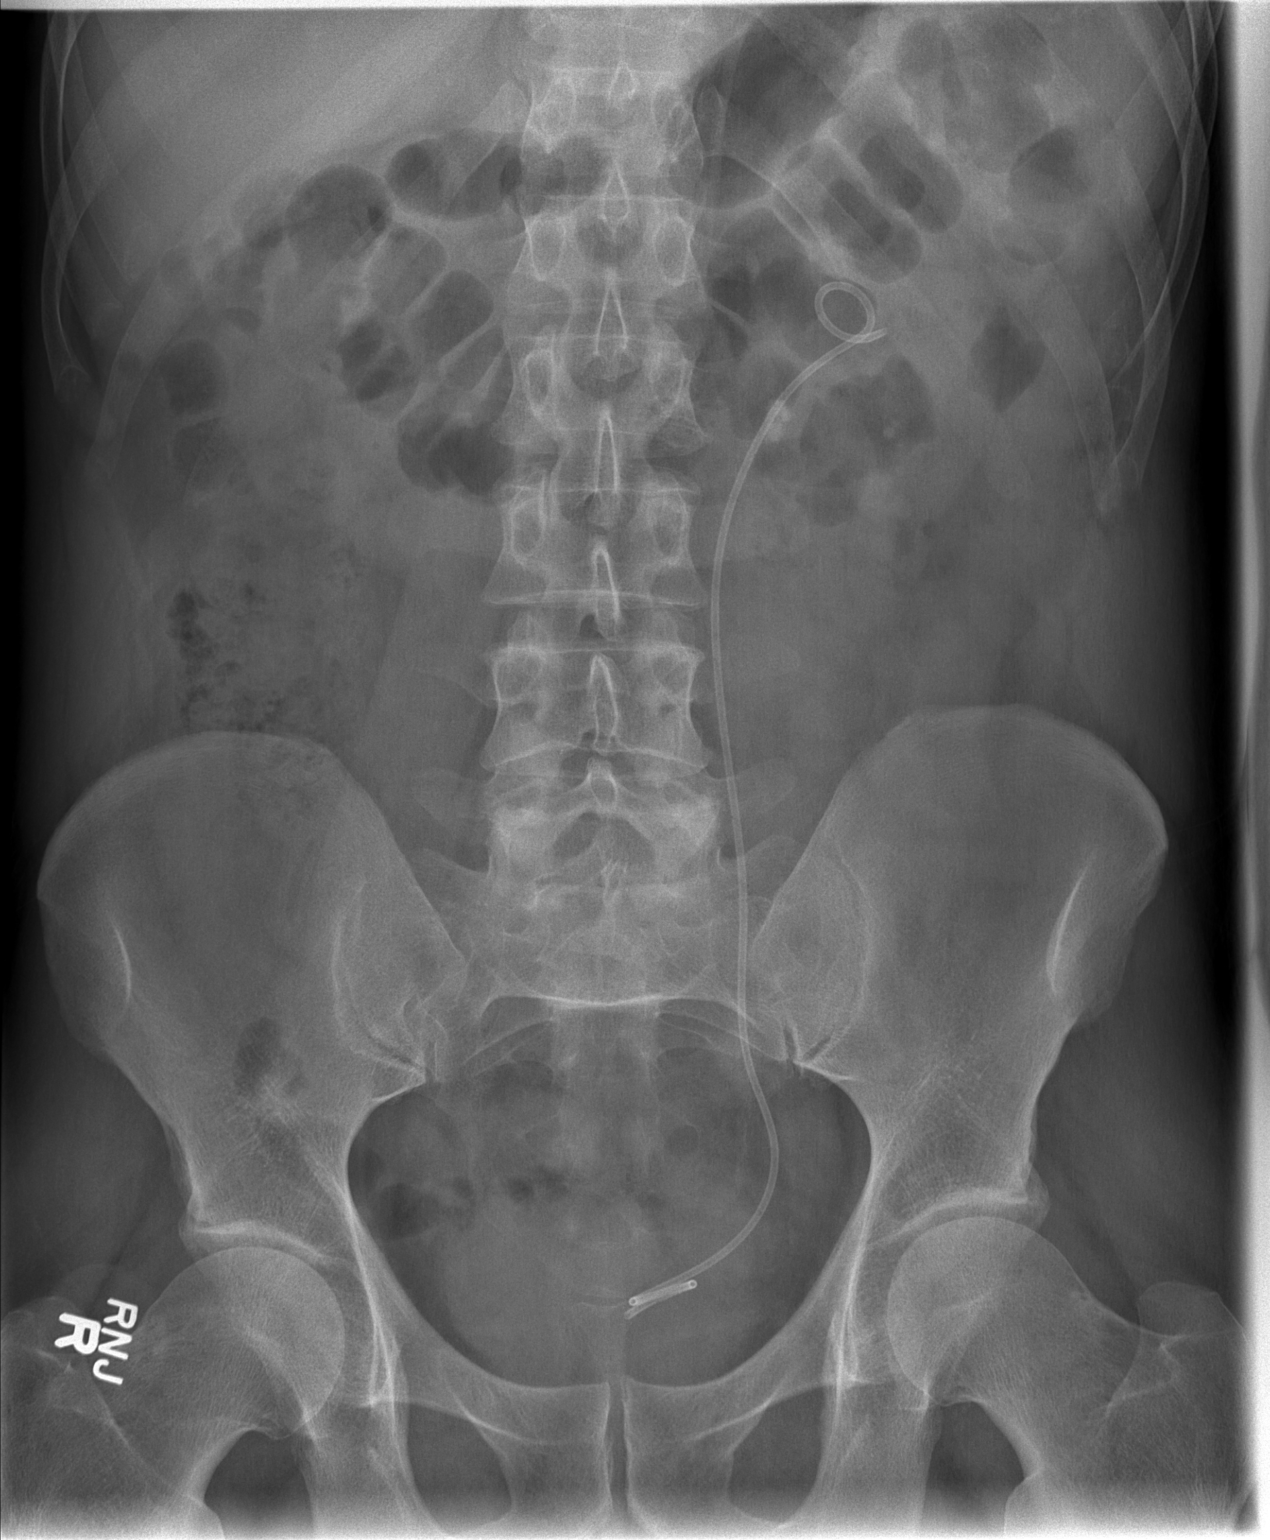

[1 of 1 positions shown; findings below may reference images not displayed]

FINDINGS: Double-J left ureteral stent noted. Calculi again noted in the left
upper ureter. Left nephrolithiasis again noted. No bowel distention.
No acute bony abnormality.
IMPRESSION: 1. Double-J left ureteral stent in good anatomic position.
2. Stable position of prominent left upper ureteral calculi. Stable
left nephrolithiasis.

## 2015-05-24 SURGERY — LITHOTRIPSY, ESWL
Anesthesia: LOCAL | Laterality: Left

## 2015-05-24 MED ORDER — DIPHENHYDRAMINE HCL 25 MG PO CAPS
25.0000 mg | ORAL_CAPSULE | ORAL | Status: AC
  Administered 2015-05-24: 25 mg via ORAL
  Filled 2015-05-24: qty 1

## 2015-05-24 MED ORDER — SODIUM CHLORIDE 0.9 % IV SOLN
INTRAVENOUS | Status: DC
  Administered 2015-05-24: 08:00:00 via INTRAVENOUS

## 2015-05-24 MED ORDER — CIPROFLOXACIN HCL 500 MG PO TABS
500.0000 mg | ORAL_TABLET | ORAL | Status: AC
  Administered 2015-05-24: 500 mg via ORAL
  Filled 2015-05-24: qty 1

## 2015-05-24 MED ORDER — OXYCODONE-ACETAMINOPHEN 10-325 MG PO TABS: 1.0000 | ORAL_TABLET | Freq: Four times a day (QID) | ORAL | Status: DC | PRN

## 2015-05-24 MED ORDER — DIAZEPAM 5 MG PO TABS
10.0000 mg | ORAL_TABLET | ORAL | Status: AC
  Administered 2015-05-24: 10 mg via ORAL
  Filled 2015-05-24: qty 2

## 2015-05-24 NOTE — Discharge Instructions (Signed)
Lithotripsy for Kidney Stones °Lithotripsy is a treatment that can sometimes help eliminate kidney stones and pain that they cause. A form of lithotripsy, also known as extracorporeal shock wave lithotripsy, is a nonsurgical procedure that helps your body rid itself of the kidney stone when it is too big to pass on its own. Extracorporeal shock wave lithotripsy is a method of crushing a kidney stone with shock waves. These shock waves pass through your body and are focused on your stone. They cause the kidney stones to crumble while still in the urinary tract. It is then easier for the smaller pieces of stone to pass in the urine. °Lithotripsy usually takes about an hour. It is done in a hospital, a lithotripsy center, or a mobile unit. It usually does not require an overnight stay. Your health care provider will instruct you on preparation for the procedure. Your health care provider will tell you what to expect afterward. °LET YOUR HEALTH CARE PROVIDER KNOW ABOUT: °· Any allergies you have. °· All medicines you are taking, including vitamins, herbs, eye drops, creams, and over-the-counter medicines. °· Previous problems you or members of your family have had with the use of anesthetics. °· Any blood disorders you have. °· Previous surgeries you have had. °· Medical conditions you have. °RISKS AND COMPLICATIONS °Generally, lithotripsy for kidney stones is a safe procedure. However, as with any procedure, complications can occur. Possible complications include: °· Infection. °· Bleeding of the kidney. °· Bruising of the kidney or skin. °· Obstruction of the ureter. °· Failure of the stone to fragment. °BEFORE THE PROCEDURE °· Do not eat or drink for 6-8 hours prior to the procedure. You may, however, take the medications with a sip of water that your physician instructs you to take °· Do not take aspirin or aspirin-containing products for 7 days prior to your procedure °· Do not take nonsteroidal anti-inflammatory  products for 7 days prior to your procedure °PROCEDURE °A stent (flexible tube with holes) may be placed in your ureter. The ureter is the tube that transports the urine from the kidneys to the bladder. Your health care provider may place a stent before the procedure. This will help keep urine flowing from the kidney if the fragments of the stone block the ureter. You may have an IV tube placed in one of your veins to give you fluids and medicines. These medicines may help you relax or make you sleep. During the procedure, you will lie comfortably on a fluid-filled cushion or in a warm-water bath. After an X-ray or ultrasound exam to locate your stone, shock waves are aimed at the stone. If you are awake, you may feel a tapping sensation as the shock waves pass through your body. If large stone particles remain after treatment, a second procedure may be necessary at a later date. °For comfort during the test: °· Relax as much as possible. °· Try to remain still as much as possible. °· Try to follow instructions to speed up the test. °· Let your health care provider know if you are uncomfortable, anxious, or in pain. °AFTER THE PROCEDURE  °After surgery, you will be taken to the recovery area. A nurse will watch and check your progress. Once you're awake, stable, and taking fluids well, you will be allowed to go home as long as there are no problems. You will also be allowed to pass your urine before discharge. You may be given antibiotics to help prevent infection. You may also be prescribed   pain medicine if needed. In a week or two, your health care provider may remove your stent, if you have one. You may first have an X-ray exam to check on how successful the fragmentation of your stone has been and how much of the stone has passed. Your health care provider will check to see whether or not stone particles remain. °SEEK IMMEDIATE MEDICAL CARE IF: °· You develop a fever or shaking chills. °· Your pain is not  relieved by medicine. °· You feel sick to your stomach (nauseated) and you vomit. °· You develop heavy bleeding. °· You have difficulty urinating. °· You start to pass your stent from your penis. °Document Released: 09/15/2000 Document Revised: 07/09/2013 Document Reviewed: 04/03/2013 °[employer]® Patient Information ©2015 [employer]. This information is not intended to replace advice given to you by your health care provider. Make sure you discuss any questions you have with your health care provider. ° °

## 2015-05-24 NOTE — H&P (Signed)
Urology Admission H&P  Chief Complaint: left flank pain  History of Present Illness: Mr Bolender is a 54yo with a hx of left UPJ stone. He is s/p ureteral stent placement. He presents today for definiative management. He has mild pain and LUTS from the stent   Past Medical History  Diagnosis Date  . Asthma   . Renal stone    Past Surgical History  Procedure Laterality Date  . Cystoscopy with stent placement Left 05/20/2015    Procedure: CYSTOSCOPY WITH STENT PLACEMENT;  Surgeon: Crist Fat, MD;  Location: [facility] ORS;  Service: Urology;  Laterality: Left;  . Knee surgery Right 84,96, 2003    x3  . Shoulder surgery Left 2010  . Cardiac catheterization  2006    negative  . Cystoscopy/retrograde/ureteroscopy/stone extraction with basket  2000    pt. unsure of which side done    Home Medications:  Prescriptions prior to admission  Medication Sig Dispense Refill Last Dose  . albuterol (PROVENTIL HFA;VENTOLIN HFA) 108 (90 BASE) MCG/ACT inhaler Inhale 1-2 puffs into the lungs as needed for wheezing or shortness of breath.   05/21/2015 at 1700  . tamsulosin (FLOMAX) 0.4 MG CAPS capsule Take 1 capsule (0.4 mg total) by mouth daily after supper. 30 capsule 0 05/23/2015 at 1730  . [DISCONTINUED] oxyCODONE-acetaminophen (PERCOCET) 10-325 MG per tablet Take 1 tablet by mouth every 6 (six) hours as needed for pain. 20 tablet 0 05/23/2015 at 1500   Allergies:  Allergies  Allergen Reactions  . Aspirin Other (See Comments)    Upset stomach   . Morphine And Related Itching  . Naprosyn [Naproxen] Other (See Comments)    Upset stomach   . Penicillins Rash    Family History  Problem Relation Age of Onset  . Cancer Mother   . Heart failure Mother   . Hypertension Mother   . CVA Mother   . Diabetes Other    Social History:  reports that he has never smoked. He does not have any smokeless tobacco history on file. He reports that he drinks alcohol. His drug history is not on file.  Review  of Systems  Genitourinary: Positive for flank pain.  All other systems reviewed and are negative.   Physical Exam:  Vital signs in last 24 hours: Temp:  [97.1 F (36.2 C)-97.5 F (36.4 C)] 97.1 F (36.2 C) (08/22 1125) Pulse Rate:  [48-52] 49 (08/22 1152) Resp:  [16] 16 (08/22 1152) BP: (122-135)/(65-79) 122/67 mmHg (08/22 1152) SpO2:  [98 %-99 %] 98 % (08/22 1152) Weight:  [68.04 kg (150 lb)] 68.04 kg (150 lb) (08/22 0734) Physical Exam  Constitutional: He is oriented to person, place, and time. He appears well-developed and well-nourished.  HENT:  Head: Normocephalic and atraumatic.  Eyes: EOM are normal. Pupils are equal, round, and reactive to light.  Neck: Normal range of motion. No thyromegaly present.  Cardiovascular: Normal rate and regular rhythm.   Respiratory: Effort normal and breath sounds normal. No respiratory distress.  GI: Soft. He exhibits no distension.  Musculoskeletal: Normal range of motion.  Neurological: He is alert and oriented to person, place, and time.  Skin: Skin is warm and dry.  Psychiatric: He has a normal mood and affect. His behavior is normal. Judgment and thought content normal.    Laboratory Data:  No results found for this or any previous visit (from the past 24 hour(s)). Recent Results (from the past 240 hour(s))  Surgical pcr screen     Status: None  Collection Time: 05/20/15  3:53 PM  Result Value Ref Range Status   MRSA, PCR NEGATIVE NEGATIVE Final   Staphylococcus aureus NEGATIVE NEGATIVE Final    Comment:        The Xpert SA Assay (FDA approved for NASAL specimens in patients over 20 years of age), is one component of a comprehensive surveillance program.  Test performance has been validated by [facility] for patients greater than or equal to 25 year old. It is not intended to diagnose infection nor to guide or monitor treatment.    Creatinine:  Recent Labs  05/20/15 0952  CREATININE 1.02     Impression/Assessment:  Left UPJ stone  Plan:  Risks/benefits/alternatives to L ESWL was explained to the patient and he understands and wishes to proceed with surgery  Ariyonna Twichell L 05/24/2015, 11:56 AM

## 2015-05-26 NOTE — Discharge Summary (Signed)
[facility] [place] at [facility]   PATIENT NAME: Shawn [name]    MR#:  [id number]  DATE OF BIRTH:  [birth year]  DATE OF ADMISSION:  05/20/2015 ADMITTING PHYSICIAN: Milagros Loll, MD  DATE OF DISCHARGE: 05/20/2015  9:15 PM  PRIMARY CARE PHYSICIAN: No PCP Per Patient    ADMISSION DIAGNOSIS:  Kidney stone [N20.0] Flank pain, acute [R10.10]  DISCHARGE DIAGNOSIS:  Active Problems:   Ureteral stone   SECONDARY DIAGNOSIS:   Past Medical History  Diagnosis Date  . Asthma   . Renal stone      ADMITTING HISTORY  Shawn [name] is a 54 y.o. male with a known history of asthma and nephrolithiasis presents to the emergency room complaining of acute onset of left flank pain. He mentions that this feels similar to the last time he had a kidney stone removed. Patient has noticed some hematuria and dysuria. Some nausea but no vomiting. No diarrhea or constipation. Has received IV pain medications in the emergency room and feels mildly improved. He had similar complaints many years back when he had a ureteral stent placed in [place], [place]. Today on the CT scan he has been found to have 10 mm left proximal ureteral stone with mild left hydroureter and hydronephrosis. Afebrile. Normal WBC. Urinalysis pending.   HOSPITAL COURSE:   Patient was admitted onto medical floor for pain control. Later taken to the operating room by urology. Had a left ureteral stent placed. His pain had resolved. Started on a pain medications and Flomax and discharged home to follow-up with urology. Uneventful hospital stay. No signs of infection.   CONSULTS OBTAINED:  Treatment Team:  Crist Fat, MD  DRUG ALLERGIES:   Allergies  Allergen Reactions  . Aspirin Other (See Comments)    Upset stomach   . Morphine And Related Itching  . Naprosyn [Naproxen] Other (See Comments)    Upset stomach   . Penicillins Rash    DISCHARGE MEDICATIONS:   Discharge Medication List as of  05/20/2015  8:39 PM    START taking these medications   Details  tamsulosin (FLOMAX) 0.4 MG CAPS capsule Take 1 capsule (0.4 mg total) by mouth daily after supper., Starting 05/20/2015, Until Discontinued, Print    oxyCODONE-acetaminophen (PERCOCET) 10-325 MG per tablet Take 1 tablet by mouth every 6 (six) hours as needed for pain., Starting 05/20/2015, Until Discontinued, Print      STOP taking these medications     Albuterol (VENTOLIN IN)          Today    VITAL SIGNS:  Blood pressure 125/70, pulse 62, temperature 98 F (36.7 C), temperature source Oral, resp. rate 16, height  (1.778 m), weight 64.774 kg (142 lb 12.8 oz), SpO2 98 %.  I/O:  No intake or output data in the 24 hours ending 05/26/15 1238  PHYSICAL EXAMINATION:  Physical Exam  GENERAL:  54 y.o.-year-old patient lying in the bed with no acute distress.  LUNGS: Normal breath sounds bilaterally, no wheezing, rales,rhonchi or crepitation. No use of accessory muscles of respiration.  CARDIOVASCULAR: S1, S2 normal. No murmurs, rubs, or gallops.  ABDOMEN: Soft, non-tender, non-distended. Bowel sounds present. No organomegaly or mass.  NEUROLOGIC: Moves all 4 extremities. PSYCHIATRIC: The patient is alert and oriented x 3.  SKIN: No obvious rash, lesion, or ulcer.   DATA REVIEW:   CBC  Recent Labs Lab 05/20/15 0952  WBC 9.4  HGB 14.8  HCT 43.9  PLT 254    Chemistries  Recent Labs Lab 05/20/15 0952  NA 142  K 3.7  CL 106  CO2 28  GLUCOSE 146*  BUN 15  CREATININE 1.02  CALCIUM 9.6  AST 27  ALT 24  ALKPHOS 90  BILITOT 0.3    Cardiac Enzymes No results for input(s): TROPONINI in the last 168 hours.  Microbiology Results  Results for orders placed or performed during the hospital encounter of 05/20/15  Surgical pcr screen     Status: None   Collection Time: 05/20/15  3:53 PM  Result Value Ref Range Status   MRSA, PCR NEGATIVE NEGATIVE Final   Staphylococcus aureus NEGATIVE NEGATIVE  Final    Comment:        The Xpert SA Assay (FDA approved for NASAL specimens in patients over 17 years of age), is one component of a comprehensive surveillance program.  Test performance has been validated by [facility] for patients greater than or equal to 7 year old. It is not intended to diagnose infection nor to guide or monitor treatment.     RADIOLOGY:  No results found.    Follow up with PCP and urology in 1 week.  Management plans discussed with the patient, family and they are in agreement.  CODE STATUS:   TOTAL TIME TAKING CARE OF THIS PATIENT ON DAY OF DISCHARGE: more than 30 minutes.    Milagros Loll R M.D on 05/26/2015 at 12:38 PM  Between 7am to 6pm - Pager - [phone]  After 6pm go to [email or web address] - password [email or web address]  [facility]  Office  [phone]  CC: Primary care physician; No PCP Per Patient

## 2015-06-09 ENCOUNTER — Telehealth: Payer: Self-pay | Admitting: Urology

## 2015-06-09 NOTE — Telephone Encounter (Signed)
Per Dr. Ronne Binning patient was scheduled for surgery on 06-11-15 for a Left URS with holmium laser, patient was notified of surgery date and what time to arrive at Same Day Surgery the day of.

## 2015-06-11 ENCOUNTER — Ambulatory Visit: Payer: BLUE CROSS/BLUE SHIELD | Admitting: Registered Nurse

## 2015-06-11 ENCOUNTER — Encounter
Admission: RE | Disposition: A | Discharge status: Home / Self Care | Payer: Self-pay | Source: Ambulatory Visit | Attending: Urology

## 2015-06-11 ENCOUNTER — Ambulatory Visit
Admission: RE | Admit: 2015-06-11 | Discharge: 2015-06-11 | Disposition: A | Discharge status: Home / Self Care | Payer: BLUE CROSS/BLUE SHIELD | Source: Ambulatory Visit | Attending: Urology | Admitting: Urology

## 2015-06-11 ENCOUNTER — Encounter: Payer: Self-pay | Admitting: *Deleted

## 2015-06-11 DIAGNOSIS — Z8249 Family history of ischemic heart disease and other diseases of the circulatory system: Secondary | ICD-10-CM | POA: Diagnosis not present

## 2015-06-11 DIAGNOSIS — Z809 Family history of malignant neoplasm, unspecified: Secondary | ICD-10-CM | POA: Diagnosis not present

## 2015-06-11 DIAGNOSIS — N201 Calculus of ureter: Secondary | ICD-10-CM

## 2015-06-11 DIAGNOSIS — Z833 Family history of diabetes mellitus: Secondary | ICD-10-CM | POA: Insufficient documentation

## 2015-06-11 DIAGNOSIS — Z888 Allergy status to other drugs, medicaments and biological substances status: Secondary | ICD-10-CM | POA: Diagnosis not present

## 2015-06-11 DIAGNOSIS — Z823 Family history of stroke: Secondary | ICD-10-CM | POA: Diagnosis not present

## 2015-06-11 DIAGNOSIS — Z885 Allergy status to narcotic agent status: Secondary | ICD-10-CM | POA: Insufficient documentation

## 2015-06-11 DIAGNOSIS — J45909 Unspecified asthma, uncomplicated: Secondary | ICD-10-CM | POA: Insufficient documentation

## 2015-06-11 DIAGNOSIS — Z886 Allergy status to analgesic agent status: Secondary | ICD-10-CM | POA: Insufficient documentation

## 2015-06-11 DIAGNOSIS — Z87442 Personal history of urinary calculi: Secondary | ICD-10-CM | POA: Insufficient documentation

## 2015-06-11 DIAGNOSIS — Z880 Allergy status to penicillin: Secondary | ICD-10-CM | POA: Insufficient documentation

## 2015-06-11 DIAGNOSIS — N200 Calculus of kidney: Secondary | ICD-10-CM

## 2015-06-11 HISTORY — PX: CYSTOSCOPY/URETEROSCOPY/HOLMIUM LASER: SHX6545

## 2015-06-11 HISTORY — PX: CYSTOSCOPY W/ URETERAL STENT REMOVAL: SHX1430

## 2015-06-11 SURGERY — CYSTOURETEROSCOPY, USING HOLMIUM LASER
Anesthesia: General | Laterality: Left

## 2015-06-11 MED ORDER — SUGAMMADEX SODIUM 200 MG/2ML IV SOLN
INTRAVENOUS | Status: DC | PRN
  Administered 2015-06-11: 150 mg via INTRAVENOUS

## 2015-06-11 MED ORDER — ONDANSETRON HCL 4 MG/2ML IJ SOLN
INTRAMUSCULAR | Status: DC | PRN
  Administered 2015-06-11: 4 mg via INTRAVENOUS

## 2015-06-11 MED ORDER — KETOROLAC TROMETHAMINE 30 MG/ML IJ SOLN
INTRAMUSCULAR | Status: DC | PRN
  Administered 2015-06-11: 30 mg via INTRAVENOUS

## 2015-06-11 MED ORDER — MIDAZOLAM HCL 2 MG/2ML IJ SOLN
INTRAMUSCULAR | Status: DC | PRN
  Administered 2015-06-11: 2 mg via INTRAVENOUS

## 2015-06-11 MED ORDER — FENTANYL CITRATE (PF) 100 MCG/2ML IJ SOLN
INTRAMUSCULAR | Status: AC
  Administered 2015-06-11: 25 ug via INTRAVENOUS
  Filled 2015-06-11: qty 2

## 2015-06-11 MED ORDER — OXYCODONE-ACETAMINOPHEN 5-325 MG PO TABS
ORAL_TABLET | ORAL | Status: AC
  Filled 2015-06-11: qty 1

## 2015-06-11 MED ORDER — BELLADONNA ALKALOIDS-OPIUM 16.2-60 MG RE SUPP
RECTAL | Status: AC
Start: 2015-06-11 — End: 2015-06-11
  Filled 2015-06-11: qty 1

## 2015-06-11 MED ORDER — DEXAMETHASONE SODIUM PHOSPHATE 4 MG/ML IJ SOLN
INTRAMUSCULAR | Status: DC | PRN
  Administered 2015-06-11: 10 mg via INTRAVENOUS

## 2015-06-11 MED ORDER — OXYCODONE HCL 5 MG PO TABS
ORAL_TABLET | ORAL | Status: AC
  Filled 2015-06-11: qty 1

## 2015-06-11 MED ORDER — LIDOCAINE HCL 2 % EX GEL
CUTANEOUS | Status: AC
  Filled 2015-06-11: qty 10

## 2015-06-11 MED ORDER — PROPOFOL 10 MG/ML IV BOLUS
INTRAVENOUS | Status: DC | PRN
  Administered 2015-06-11: 200 mg via INTRAVENOUS

## 2015-06-11 MED ORDER — FENTANYL CITRATE (PF) 100 MCG/2ML IJ SOLN
25.0000 ug | INTRAMUSCULAR | Status: DC | PRN
  Administered 2015-06-11 (×2): 25 ug via INTRAVENOUS

## 2015-06-11 MED ORDER — LIDOCAINE HCL 2 % EX GEL
CUTANEOUS | Status: DC | PRN
  Administered 2015-06-11: 1 via TOPICAL

## 2015-06-11 MED ORDER — CEFAZOLIN SODIUM-DEXTROSE 2-3 GM-% IV SOLR: 2.0000 g | Freq: Once | INTRAVENOUS | Status: DC

## 2015-06-11 MED ORDER — FENTANYL CITRATE (PF) 100 MCG/2ML IJ SOLN
INTRAMUSCULAR | Status: DC | PRN
  Administered 2015-06-11: 100 ug via INTRAVENOUS
  Administered 2015-06-11 (×2): 50 ug via INTRAVENOUS

## 2015-06-11 MED ORDER — OXYCODONE HCL 5 MG PO TABS: 5.0000 mg | ORAL_TABLET | Freq: Once | ORAL | Status: DC

## 2015-06-11 MED ORDER — ROCURONIUM BROMIDE 100 MG/10ML IV SOLN
INTRAVENOUS | Status: DC | PRN
  Administered 2015-06-11: 30 mg via INTRAVENOUS
  Administered 2015-06-11: 20 mg via INTRAVENOUS

## 2015-06-11 MED ORDER — SULFAMETHOXAZOLE-TRIMETHOPRIM 800-160 MG PO TABS: 1.0000 | ORAL_TABLET | Freq: Two times a day (BID) | ORAL | Status: DC

## 2015-06-11 MED ORDER — ONDANSETRON HCL 4 MG/2ML IJ SOLN: 4.0000 mg | Freq: Once | INTRAMUSCULAR | Status: DC | PRN

## 2015-06-11 MED ORDER — LIDOCAINE HCL (CARDIAC) 20 MG/ML IV SOLN
INTRAVENOUS | Status: DC | PRN
Start: 2015-06-11 — End: 2015-06-11
  Administered 2015-06-11: 80 mg via INTRAVENOUS

## 2015-06-11 MED ORDER — CEFAZOLIN SODIUM-DEXTROSE 2-3 GM-% IV SOLR
INTRAVENOUS | Status: AC
  Filled 2015-06-11: qty 50

## 2015-06-11 MED ORDER — LACTATED RINGERS IV SOLN
INTRAVENOUS | Status: DC
  Administered 2015-06-11 (×2): via INTRAVENOUS

## 2015-06-11 MED ORDER — OXYCODONE-ACETAMINOPHEN 5-325 MG PO TABS
1.0000 | ORAL_TABLET | Freq: Once | ORAL | Status: AC
  Administered 2015-06-11: 1 via ORAL

## 2015-06-11 MED ORDER — OXYCODONE-ACETAMINOPHEN 10-325 MG PO TABS: 1.0000 | ORAL_TABLET | Freq: Four times a day (QID) | ORAL | Status: DC | PRN

## 2015-06-11 SURGICAL SUPPLY — 44 items
BAG DRAIN CYSTO-URO LG1000N (MISCELLANEOUS) ×2 IMPLANT
BAG URO CATCHER STRL LF (DRAPE) ×2 IMPLANT
BASKET LASER NITINOL 1.9FR (BASKET) IMPLANT
BASKET STONE 1.7 NGAGE (UROLOGICAL SUPPLIES) IMPLANT
BASKET ZERO TIP NITINOL 2.4FR (BASKET) ×4 IMPLANT
BSKT STON RTRVL 120 1.9FR (BASKET)
BSKT STON RTRVL ZERO TP 2.4FR (BASKET) ×2
CANISTER SUCT LVC 12 LTR MEDI- (MISCELLANEOUS) IMPLANT
CATH INTERMIT  6FR 70CM (CATHETERS) IMPLANT
CATH URETL 5X70 OPEN END (CATHETERS) ×2 IMPLANT
CLOTH BEACON ORANGE TIMEOUT ST (SAFETY) ×2 IMPLANT
CONRAY 43 FOR UROLOGY 50M (MISCELLANEOUS) ×2 IMPLANT
FIBER LASER FLEXIVA 365 (UROLOGICAL SUPPLIES) IMPLANT
FIBER LASER TRAC TIP (UROLOGICAL SUPPLIES) IMPLANT
GLOVE BIO SURGEON STRL SZ7 (GLOVE) ×2 IMPLANT
GLOVE BIO SURGEON STRL SZ8 (GLOVE) ×2 IMPLANT
GOWN STRL REUS W/ TWL LRG LVL3 (GOWN DISPOSABLE) ×1 IMPLANT
GOWN STRL REUS W/ TWL XL LVL3 (GOWN DISPOSABLE) ×1 IMPLANT
GOWN STRL REUS W/TWL LRG LVL3 (GOWN DISPOSABLE) ×2
GOWN STRL REUS W/TWL XL LVL3 (GOWN DISPOSABLE) ×2
GUIDEWIRE ANG ZIPWIRE 038X150 (WIRE) ×2 IMPLANT
GUIDEWIRE STR DUAL SENSOR (WIRE) IMPLANT
GUIDEWIRE STR ZIPWIRE 035X150 (MISCELLANEOUS) ×2 IMPLANT
IV NS IRRIG 3000ML ARTHROMATIC (IV SOLUTION) ×2 IMPLANT
KIT RM TURNOVER CYSTO AR (KITS) ×2 IMPLANT
LASER HOLMIUM PROCEDURE (MISCELLANEOUS) ×2 IMPLANT
MANIFOLD NEPTUNE II (INSTRUMENTS) IMPLANT
PACK CYSTO (CUSTOM PROCEDURE TRAY) ×2 IMPLANT
PACK CYSTO AR (MISCELLANEOUS) ×2 IMPLANT
PREP PVP WINGED SPONGE (MISCELLANEOUS) ×2 IMPLANT
SENSORWIRE 0.038 NOT ANGLED (WIRE) ×2
SET CYSTO W/LG BORE CLAMP LF (SET/KITS/TRAYS/PACK) ×2 IMPLANT
SOL .9 NS 3000ML IRR  AL (IV SOLUTION) ×1
SOL .9 NS 3000ML IRR AL (IV SOLUTION) ×1
SOL .9 NS 3000ML IRR UROMATIC (IV SOLUTION) ×1 IMPLANT
STENT URET 6FRX24 CONTOUR (STENTS) IMPLANT
STENT URET 6FRX26 CONTOUR (STENTS) ×2 IMPLANT
SURGILUBE 2OZ TUBE FLIPTOP (MISCELLANEOUS) ×2 IMPLANT
SYRINGE 10CC LL (SYRINGE) ×2 IMPLANT
SYRINGE IRR TOOMEY STRL 70CC (SYRINGE) ×2 IMPLANT
TUBE FEED INF 8F (TUBING) ×2 IMPLANT
TUBE FEEDING 8FR 16IN STR KANG (MISCELLANEOUS) IMPLANT
WATER STERILE IRR 1000ML POUR (IV SOLUTION) ×2 IMPLANT
WIRE SENSOR 0.038 NOT ANGLED (WIRE) ×1 IMPLANT

## 2015-06-11 NOTE — Op Note (Signed)
Preoperative diagnosis: Left renal stone  Postoperative diagnosis: Same  Procedure: 1 cystoscopy 2. Left retrograde pyelography 3.  Intraoperative fluoroscopy, under one hour, with interpretation 4.  Left ureteroscopic stone manipulation with laser lithotripsy 5.  Left 6 x 26 JJ stent exhange  Attending: Cleda Mccreedy  Anesthesia: General  Estimated blood loss: None  Drains: Left 6 x 26 JJ ureteral stent with tether  Specimens: stone for analysis  Antibiotics: ancef  Findings: left ureteral stones, multiple lower pole stones. No hydronephrosis. No masses/lesions in the bladder. Ureteral orifices in normal anatomic location.  Indications: Patient is a 54 year old male with a history of left renal stone and who has persistent left flank pain and failure to pass his stones after ESWL.  After discussing treatment options, he decided proceed with left ureteroscopic stone manipulation.  Procedure her in detail: The patient was brought to the operating room and a brief timeout was done to ensure correct patient, correct procedure, correct site.  General anesthesia was administered patient was placed in dorsal lithotomy position. Their genitalia was then prepped and draped in usual sterile fashion.  A rigid 22 French cystoscope was passed in the urethra and the bladder.  Bladder was inspected free masses or lesions.  the ureteral orifices were in the normal orthotopic locations.  Using a grasper the ureteral stent was brought to the urethral meatus. A zip wire was advanced though the stent and up to the renal pelvis. The stent was removed. a 6 french ureteral catheter was then instilled into the left ureteral orifice.  a gentle retrograde was obtained and findings noted above. we then removed the cystoscope and cannulated the left ureteral orifice with a semirigid ureteroscope.  Multiple proximal ureteral stones were encountered. Using a 200nm laser fiber the stones were fragmented and the  fragments were removed with an Escape basket. Once we reached the UPJ a sensor wire was advanced in to the renal pelvis. We then removed the ureteroscope and advanced a flexible ureteroscope over the sensor wire. We encountered the stone in the rlowert.  using a 200 nm laser fiber and fragmented the stone into dust.  The larger pieces were then removed with an Escape basket. once all stone fragments were removed we then placed a 6 x 26 double-j ureteral stent over the original zip wire. We then removed the wire and good coil was noted in the the renal pelvis under fluoroscopy and the bladder under direct vision.     the stone fragments were then removed from the bladder and sent for analysis.   the bladder was then drained and this concluded the procedure which was well tolerated by patient.  Complications: None  Condition: Stable, extubated, transferred to PACU  Plan: Patient is to be discharged home as to follow-up in one week. He is to remove his stent in 72 hours by pulling tether

## 2015-06-11 NOTE — Transfer of Care (Signed)
Immediate Anesthesia Transfer of Care Note  Patient: [name]  Procedure(s) Performed: Procedure(s): CYSTOSCOPY/URETEROSCOPY/HOLMIUM LASER (Left) CYSTOSCOPY WITH STENT EXCHANGE (Left)  Patient Location: PACU  Anesthesia Type:General  Level of Consciousness: sedated  Airway & Oxygen Therapy: Patient Spontanous Breathing and Patient connected to face mask oxygen  Post-op Assessment: Report given to RN and Post -op Vital signs reviewed and stable  Post vital signs: Reviewed and stable  Last Vitals:  Filed Vitals:   06/11/15 1405  BP: 115/70  Pulse:   Temp: 35.9 C  Resp: 21    Complications: No apparent anesthesia complications

## 2015-06-11 NOTE — Anesthesia Preprocedure Evaluation (Signed)
Anesthesia Evaluation  Patient identified by MRN, date of birth, ID band Patient awake    Reviewed: Allergy & Precautions, H&P , NPO status , Patient's Chart, lab work & pertinent test results, reviewed documented beta blocker date and time   Airway Mallampati: II  TM Distance: >3 FB Neck ROM: full    Dental   Pulmonary neg pulmonary ROS, asthma ,           Cardiovascular Exercise Tolerance: Good negative cardio ROS Normal cardiovascular exam Rate:Normal     Neuro/Psych    GI/Hepatic negative GI ROS, Neg liver ROS,   Endo/Other    Renal/GU Renal disease     Musculoskeletal   Abdominal   Peds  Hematology negative hematology ROS (+)   Anesthesia Other Findings   Reproductive/Obstetrics negative OB ROS                             Anesthesia Physical Anesthesia Plan  ASA: II  Anesthesia Plan: General LMA   Post-op Pain Management:    Induction:   Airway Management Planned:   Additional Equipment:   Intra-op Plan:   Post-operative Plan:   Informed Consent: I have reviewed the patients History and Physical, chart, labs and discussed the procedure including the risks, benefits and alternatives for the proposed anesthesia with the patient or authorized representative who has indicated his/her understanding and acceptance.     Plan Discussed with: CRNA  Anesthesia Plan Comments:         Anesthesia Quick Evaluation

## 2015-06-11 NOTE — Brief Op Note (Signed)
06/11/2015  1:48 PM  PATIENT:  Shawn [name]  54 y.o. male  PRE-OPERATIVE DIAGNOSIS:  L URETERAL STONE  POST-OPERATIVE DIAGNOSIS:  L URETERAL STONE  PROCEDURE:  Procedure(s): CYSTOSCOPY/URETEROSCOPY/HOLMIUM LASER (Left) CYSTOSCOPY WITH STENT EXCHANGE (Left)  SURGEON:  Surgeon(s) and Role:    * Malen Gauze, MD - Primary  PHYSICIAN ASSISTANT:   ASSISTANTS: none   ANESTHESIA:   general  EBL:     BLOOD ADMINISTERED:none  DRAINS: left 6x26 JJ stent with tether  LOCAL MEDICATIONS USED:  NONE  SPECIMEN:  Source of Specimen:  stone  DISPOSITION OF SPECIMEN:  N/A  COUNTS:  YES  TOURNIQUET:  * No tourniquets in log *  DICTATION: .Note written in EPIC  PLAN OF CARE: Discharge to home after PACU  PATIENT DISPOSITION:  PACU - hemodynamically stable.   Delay start of Pharmacological VTE agent (>24hrs) due to surgical blood loss or risk of bleeding: not applicable

## 2015-06-11 NOTE — Discharge Instructions (Signed)
Kidney Stones °Kidney stones (urolithiasis) are deposits that form inside your kidneys. The intense pain is caused by the stone moving through the urinary tract. When the stone moves, the ureter goes into spasm around the stone. The stone is usually passed in the urine.  °CAUSES  °· A disorder that makes certain neck glands produce too much parathyroid hormone (primary hyperparathyroidism). °· A buildup of uric acid crystals, similar to gout in your joints. °· Narrowing (stricture) of the ureter. °· A kidney obstruction present at birth (congenital obstruction). °· Previous surgery on the kidney or ureters. °· Numerous kidney infections. °SYMPTOMS  °· Feeling sick to your stomach (nauseous). °· Throwing up (vomiting). °· Blood in the urine (hematuria). °· Pain that usually spreads (radiates) to the groin. °· Frequency or urgency of urination. °DIAGNOSIS  °· Taking a history and physical exam. °· Blood or urine tests. °· CT scan. °· Occasionally, an examination of the inside of the urinary bladder (cystoscopy) is performed. °TREATMENT  °· Observation. °· Increasing your fluid intake. °· Extracorporeal shock wave lithotripsy--This is a noninvasive procedure that uses shock waves to break up kidney stones. °· Surgery may be needed if you have severe pain or persistent obstruction. There are various surgical procedures. Most of the procedures are performed with the use of small instruments. Only small incisions are needed to accommodate these instruments, so recovery time is minimized. °The size, location, and chemical composition are all important variables that will determine the proper choice of action for you. Talk to your health care provider to better understand your situation so that you will minimize the risk of injury to yourself and your kidney.  °HOME CARE INSTRUCTIONS  °· Drink enough water and fluids to keep your urine clear or pale yellow. This will help you to pass the stone or stone fragments. °· Strain  all urine through the provided strainer. Keep all particulate matter and stones for your health care provider to see. The stone causing the pain may be as small as a grain of salt. It is very important to use the strainer each and every time you pass your urine. The collection of your stone will allow your health care provider to analyze it and verify that a stone has actually passed. The stone analysis will often identify what you can do to reduce the incidence of recurrences. °· Only take over-the-counter or prescription medicines for pain, discomfort, or fever as directed by your health care provider. °· Make a follow-up appointment with your health care provider as directed. °· Get follow-up X-rays if required. The absence of pain does not always mean that the stone has passed. It may have only stopped moving. If the urine remains completely obstructed, it can cause loss of kidney function or even complete destruction of the kidney. It is your responsibility to make sure X-rays and follow-ups are completed. Ultrasounds of the kidney can show blockages and the status of the kidney. Ultrasounds are not associated with any radiation and can be performed easily in a matter of minutes. °SEEK MEDICAL CARE IF: °· You experience pain that is progressive and unresponsive to any pain medicine you have been prescribed. °SEEK IMMEDIATE MEDICAL CARE IF:  °· Pain cannot be controlled with the prescribed medicine. °· You have a fever or shaking chills. °· The severity or intensity of pain increases over 18 hours and is not relieved by pain medicine. °· You develop a new onset of abdominal pain. °· You feel faint or pass out. °·   You are unable to urinate. MAKE SURE YOU:   Understand these instructions.  Will watch your condition.  Will get help right away if you are not doing well or get worse. Document Released: 09/18/2005 Document Revised: 05/21/2013 Document Reviewed: 02/19/2013 [facility] Patient Information 2015  [place], [place]. This information is not intended to replace advice given to you by your health care provider. Make sure you discuss any questions you have with your health care provider. AMBULATORY SURGERY  DISCHARGE INSTRUCTIONS   1) The drugs that you were given will stay in your system until tomorrow so for the next 24 hours you should not:  A) Drive an automobile B) Make any legal decisions C) Drink any alcoholic beverage   2) You may resume regular meals tomorrow.  Today it is better to start with liquids and gradually work up to solid foods.  You may eat anything you prefer, but it is better to start with liquids, then soup and crackers, and gradually work up to solid foods.   3) Please notify your doctor immediately if you have any unusual bleeding, trouble breathing, redness and pain at the surgery site, drainage, fever, or pain not relieved by medication.    4) Additional Instructions:        Please contact your physician with any problems or Same Day Surgery at [phone], Monday through Friday 6 am to 4 pm, or [place] at [facility] number at [phone].

## 2015-06-11 NOTE — H&P (Signed)
Urology Admission H&P  Chief Complaint: left flank pain  History of Present Illness: Mr Shawn [name] is a 53yo with a hx of left nephrolithiasis who presents today for stone extraction. He is s/p L ESWL and has not passed his fragments. A kub from last week showed the stones along the stent and he has not passed any stones. He has severe LUTS with the stent in place. He denies fevers/chills/sweats.  Past Medical History  Diagnosis Date  . Asthma   . Renal stone    Past Surgical History  Procedure Laterality Date  . Cystoscopy with stent placement Left 05/20/2015    Procedure: CYSTOSCOPY WITH STENT PLACEMENT;  Surgeon: Crist Fat, MD;  Location: [facility] ORS;  Service: Urology;  Laterality: Left;  . Knee surgery Right 84,96, 2003    x3  . Shoulder surgery Left 2010  . Cardiac catheterization  2006    negative  . Cystoscopy/retrograde/ureteroscopy/stone extraction with basket  2000    pt. unsure of which side done    Home Medications:  Prescriptions prior to admission  Medication Sig Dispense Refill Last Dose  . albuterol (PROVENTIL HFA;VENTOLIN HFA) 108 (90 BASE) MCG/ACT inhaler Inhale 1-2 puffs into the lungs as needed for wheezing or shortness of breath.   Past Week at Unknown time  . oxybutynin (DITROPAN) 5 MG tablet Take 5 mg by mouth 3 (three) times daily.   06/10/2015 at 1900  . oxyCODONE-acetaminophen (PERCOCET) 10-325 MG per tablet Take 1 tablet by mouth every 6 (six) hours as needed for pain. 30 tablet 0 06/10/2015 at 1500  . tamsulosin (FLOMAX) 0.4 MG CAPS capsule Take 1 capsule (0.4 mg total) by mouth daily after supper. 30 capsule 0 06/10/2015 at 1900   Allergies:  Allergies  Allergen Reactions  . Aspirin Other (See Comments)    Upset stomach   . Morphine And Related Itching  . Naprosyn [Naproxen] Other (See Comments)    Upset stomach   . Penicillins Rash    Family History  Problem Relation Age of Onset  . Cancer Mother   . Heart failure Mother   . Hypertension  Mother   . CVA Mother   . Diabetes Other    Social History:  reports that he has never smoked. He does not have any smokeless tobacco history on file. He reports that he drinks alcohol. His drug history is not on file.  Review of Systems  Genitourinary: Positive for dysuria, urgency, frequency, hematuria and flank pain.  All other systems reviewed and are negative.   Physical Exam:  Vital signs in last 24 hours: Temp:  [95.6 F (35.3 C)] 95.6 F (35.3 C) (09/09 1009) Pulse Rate:  [48] 48 (09/09 1009) Resp:  [16] 16 (09/09 1009) BP: (120)/(66) 120/66 mmHg (09/09 1009) SpO2:  [98 %] 98 % (09/09 1009) Weight:  [68.04 kg (150 lb)] 68.04 kg (150 lb) (09/09 1009) Physical Exam  Constitutional: He is oriented to person, place, and time. He appears well-developed and well-nourished.  HENT:  Head: Normocephalic and atraumatic.  Eyes: EOM are normal. Pupils are equal, round, and reactive to light.  Neck: Normal range of motion. No thyromegaly present.  Cardiovascular: Normal rate and regular rhythm.   Respiratory: Effort normal. No respiratory distress.  GI: Soft. He exhibits no distension.  Musculoskeletal: Normal range of motion.  Neurological: He is alert and oriented to person, place, and time.  Skin: Skin is warm and dry.  Psychiatric: He has a normal mood and affect. His behavior is  normal. Judgment and thought content normal.    Laboratory Data:  No results found for this or any previous visit (from the past 24 hour(s)). No results found for this or any previous visit (from the past 240 hour(s)). Creatinine: No results for input(s): CREATININE in the last 168 hours. Baseline Creatinine: unknown  Impression/Assessment:  53yo left ureteral stones  Plan:  The risks/benefits/alternatives to left ureteroscopic stne extraction was explained to the patient and he understands and wishes to proceed with surgery.  Illa Enlow L 06/11/2015, 11:49 AM

## 2015-06-11 NOTE — Anesthesia Procedure Notes (Signed)
Procedure Name: Intubation Date/Time: 06/11/2015 12:41 PM Performed by: Stormy Fabian Pre-anesthesia Checklist: Patient identified, Patient being monitored, Timeout performed, Emergency Drugs available and Suction available Patient Re-evaluated:Patient Re-evaluated prior to inductionOxygen Delivery Method: Circle system utilized Preoxygenation: Pre-oxygenation with 100% oxygen Intubation Type: IV induction Ventilation: Mask ventilation without difficulty Laryngoscope Size: Mac and 3 Grade View: Grade I Tube type: Oral Tube size: 7.5 mm Number of attempts: 1 Airway Equipment and Method: Stylet Placement Confirmation: ETT inserted through vocal cords under direct vision,  positive ETCO2 and breath sounds checked- equal and bilateral Secured at: 21 cm Tube secured with: Tape Dental Injury: Teeth and Oropharynx as per pre-operative assessment

## 2015-06-13 NOTE — Anesthesia Postprocedure Evaluation (Signed)
  Anesthesia Post-op Note  Patient: Shawn [name]  Procedure(s) Performed: Procedure(s): CYSTOSCOPY/URETEROSCOPY/HOLMIUM LASER (Left) CYSTOSCOPY WITH STENT EXCHANGE (Left)  Anesthesia type:General LMA  Patient location: PACU  Post pain: Pain level controlled  Post assessment: Post-op Vital signs reviewed, Patient's Cardiovascular Status Stable, Respiratory Function Stable, Patent Airway and No signs of Nausea or vomiting  Post vital signs: Reviewed and stable  Last Vitals:  Filed Vitals:   06/11/15 1547  BP: 114/63  Pulse: 1  Temp:   Resp:     Level of consciousness: awake, alert  and patient cooperative  Complications: No apparent anesthesia complications

## 2015-06-14 ENCOUNTER — Encounter: Payer: Self-pay | Admitting: Urology

## 2015-06-17 LAB — STONE ANALYSIS
CA OXALATE, DIHYDRATE: 15 %
Ca Oxalate,Monohydr.: 60 %
Ca phos cry stone ql IR: 25 %
Stone Weight KSTONE: 132 mg

## 2018-02-06 IMAGING — CR DG HIP (WITH OR WITHOUT PELVIS) 2-3V*R*
1 series · 3 of 3 positions shown · non-contrast
Comparison: None.

CLINICAL DATA: Pain status post fall

EXAM:
DG HIP (WITH OR WITHOUT PELVIS) 2-3V RIGHT

[Series 1: dg hip unilat w or w/o pelvis 2-3 views  · non-contrast · 0.14mm/px · 3 of 3 slices shown]
[im 1/3]
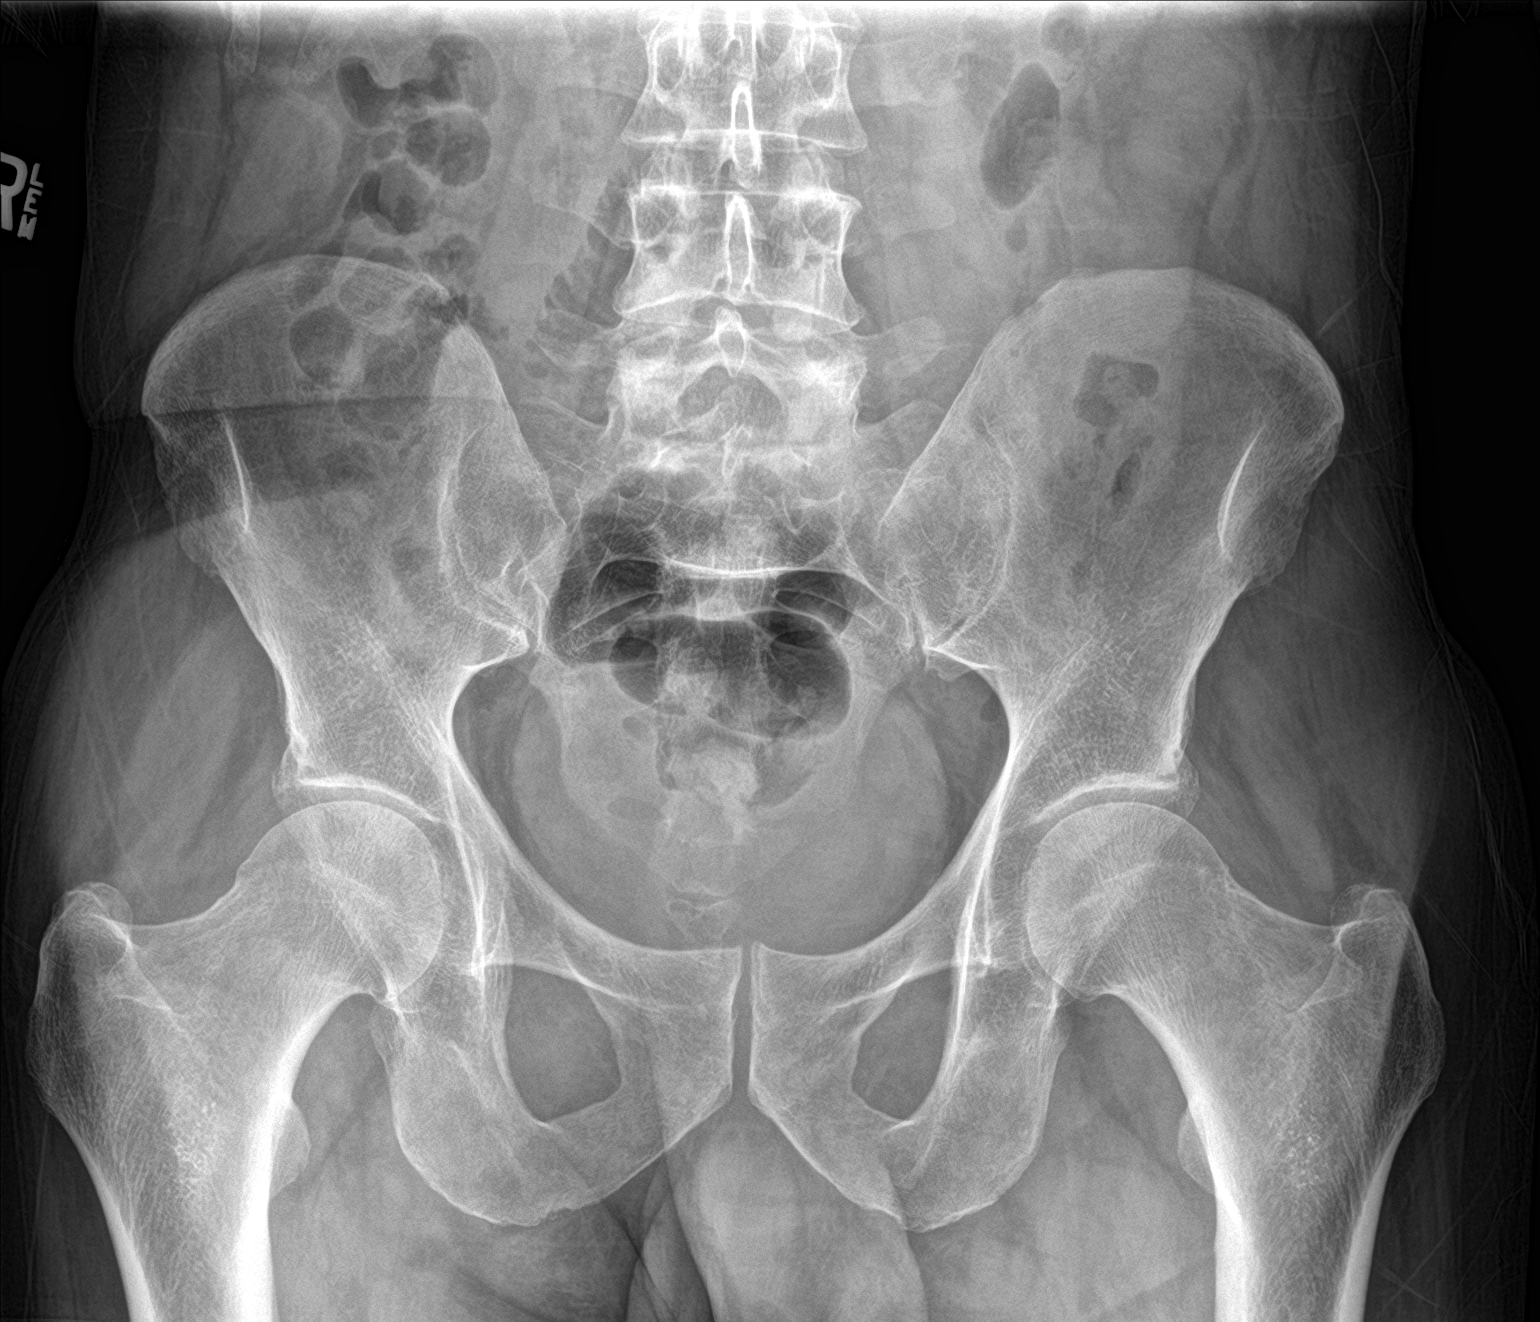
[im 2/3]
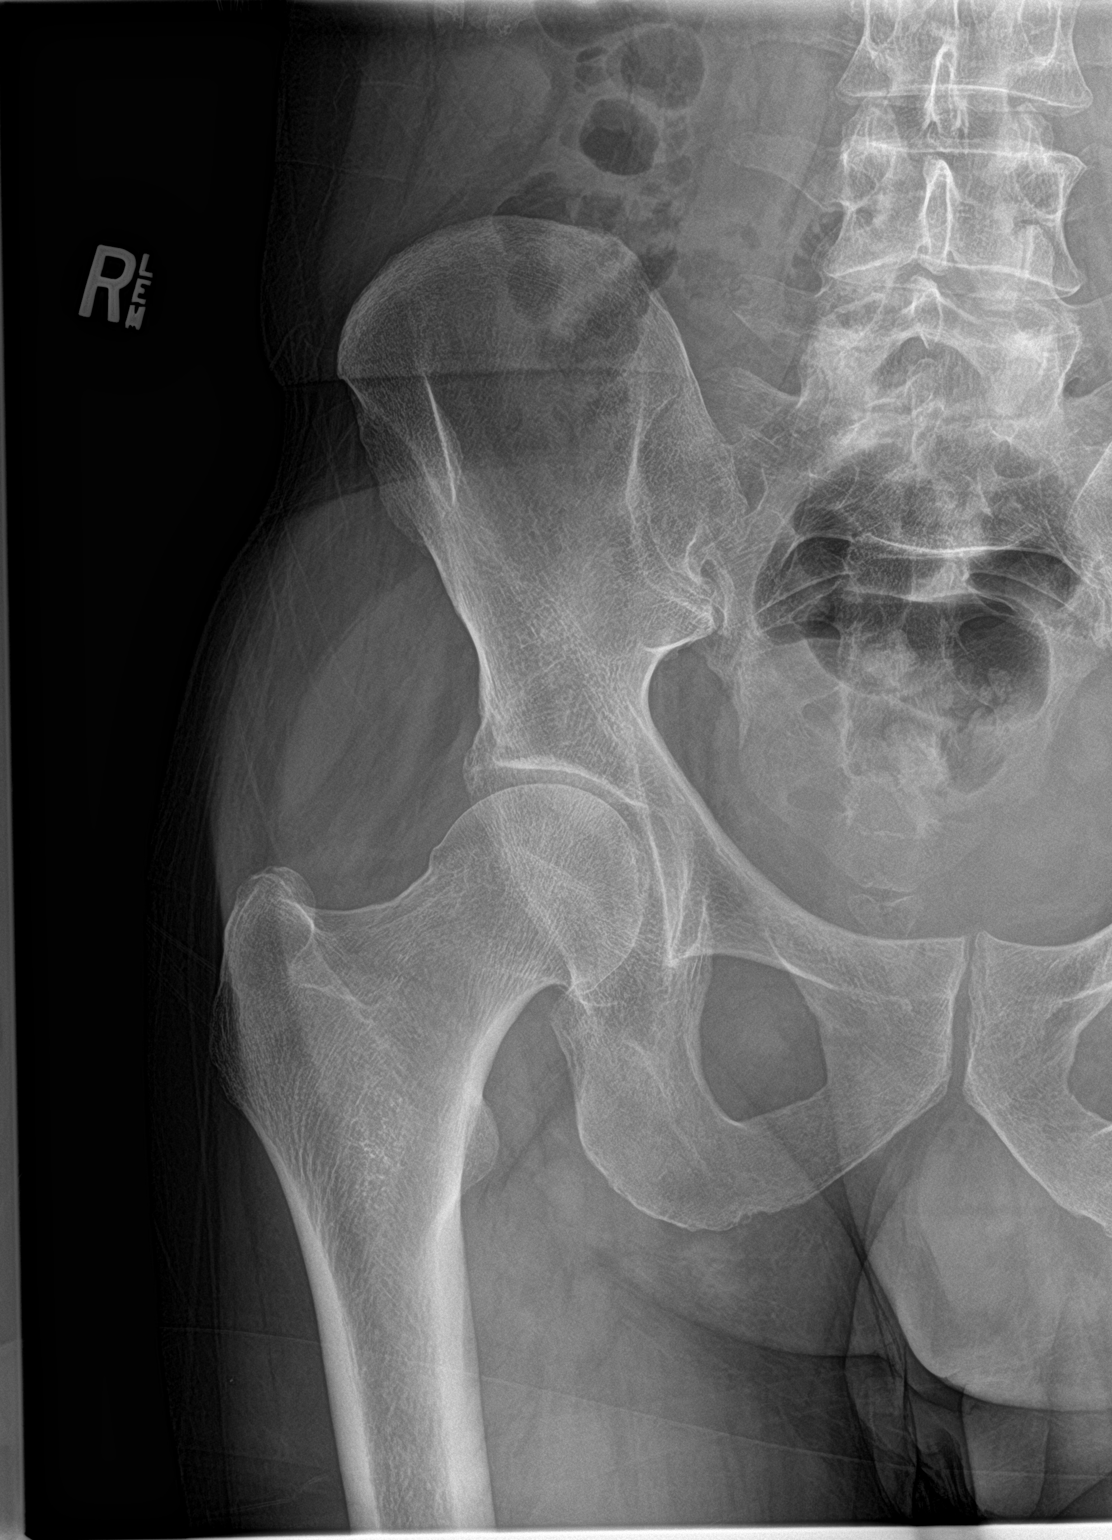
[im 3/3]
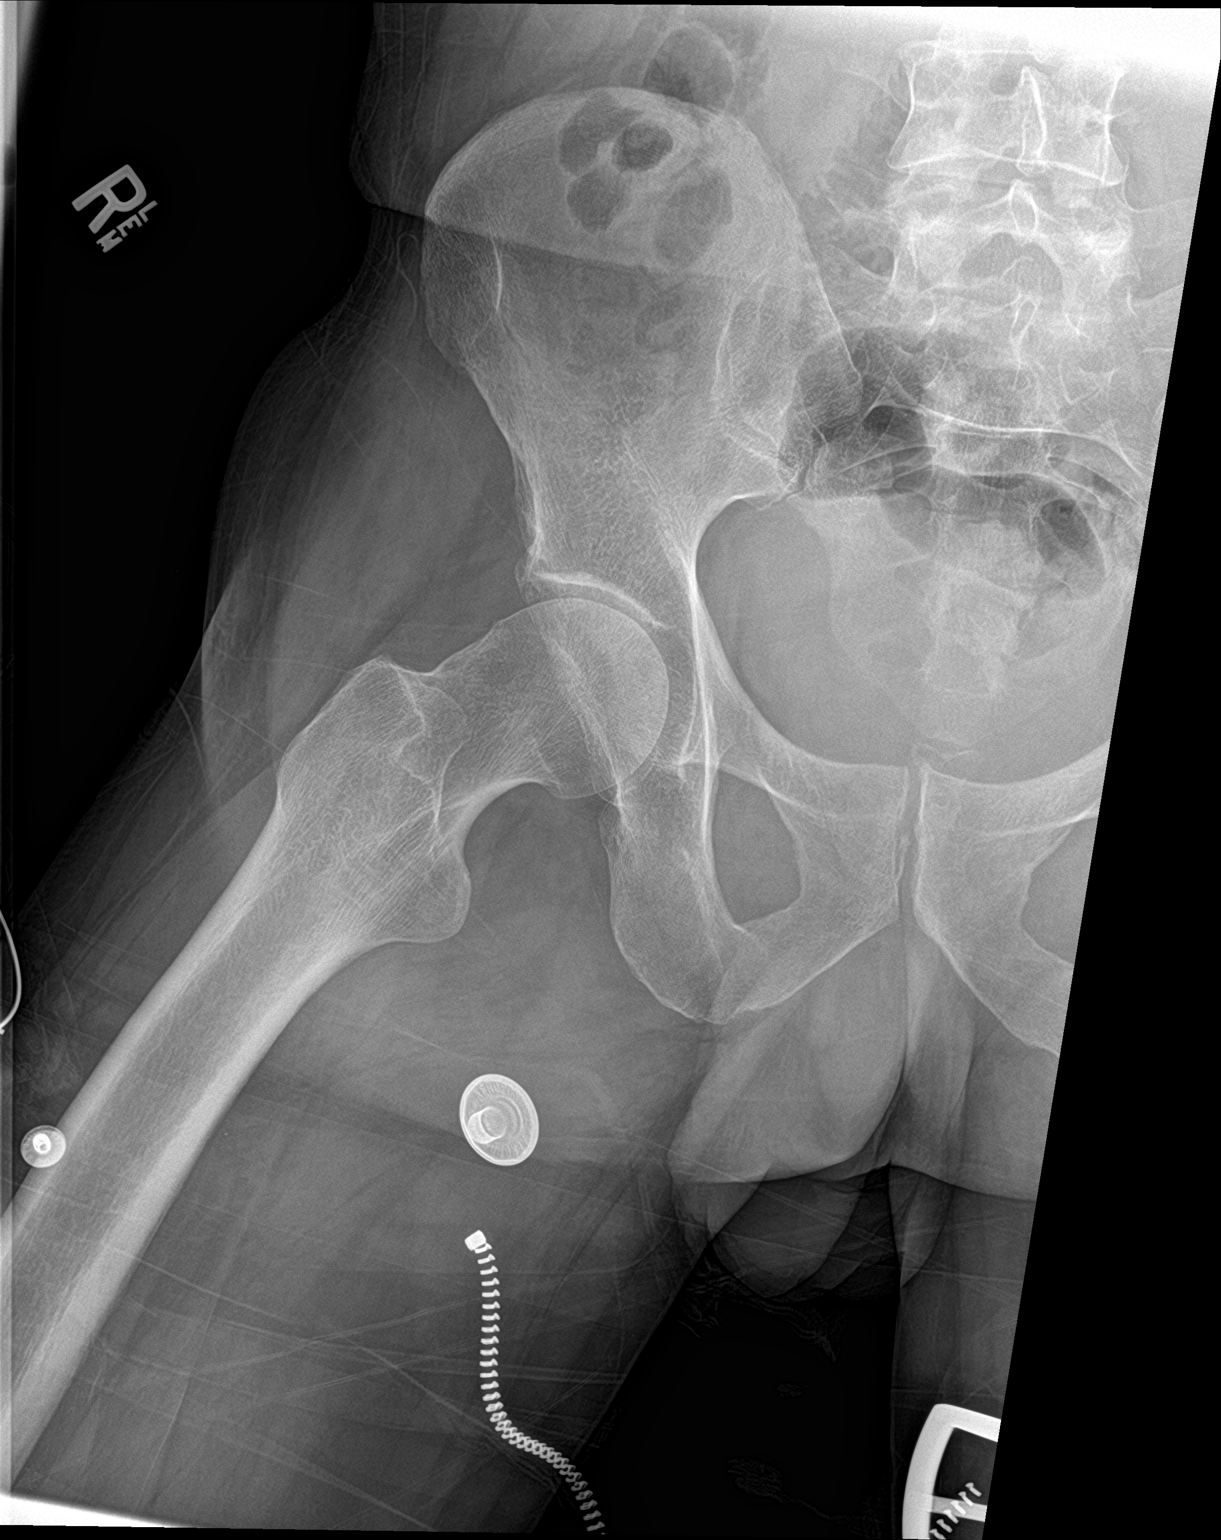

[3 of 3 positions shown; findings below may reference images not displayed]

FINDINGS: There is no evidence of hip fracture or dislocation. There is no
evidence of arthropathy or other focal bone abnormality.
IMPRESSION: Negative.

## 2019-07-28 ENCOUNTER — Emergency Department
Admission: EM | Admit: 2019-07-28 | Discharge: 2019-07-28 | Disposition: A | Discharge status: Home / Self Care | Payer: Self-pay | Attending: Emergency Medicine | Admitting: Emergency Medicine

## 2019-07-28 ENCOUNTER — Other Ambulatory Visit: Payer: Self-pay

## 2019-07-28 ENCOUNTER — Encounter: Payer: Self-pay | Admitting: Emergency Medicine

## 2019-07-28 ENCOUNTER — Emergency Department: Payer: Self-pay

## 2019-07-28 DIAGNOSIS — S5002XA Contusion of left elbow, initial encounter: Secondary | ICD-10-CM | POA: Insufficient documentation

## 2019-07-28 DIAGNOSIS — Y999 Unspecified external cause status: Secondary | ICD-10-CM | POA: Insufficient documentation

## 2019-07-28 DIAGNOSIS — W19XXXA Unspecified fall, initial encounter: Secondary | ICD-10-CM

## 2019-07-28 DIAGNOSIS — T07XXXA Unspecified multiple injuries, initial encounter: Secondary | ICD-10-CM

## 2019-07-28 DIAGNOSIS — S40012A Contusion of left shoulder, initial encounter: Secondary | ICD-10-CM | POA: Insufficient documentation

## 2019-07-28 DIAGNOSIS — S7001XA Contusion of right hip, initial encounter: Secondary | ICD-10-CM | POA: Insufficient documentation

## 2019-07-28 DIAGNOSIS — Y9301 Activity, walking, marching and hiking: Secondary | ICD-10-CM | POA: Insufficient documentation

## 2019-07-28 DIAGNOSIS — J45909 Unspecified asthma, uncomplicated: Secondary | ICD-10-CM | POA: Insufficient documentation

## 2019-07-28 DIAGNOSIS — W01198A Fall on same level from slipping, tripping and stumbling with subsequent striking against other object, initial encounter: Secondary | ICD-10-CM | POA: Insufficient documentation

## 2019-07-28 DIAGNOSIS — Y92512 Supermarket, store or market as the place of occurrence of the external cause: Secondary | ICD-10-CM | POA: Insufficient documentation

## 2019-07-28 IMAGING — CR DG ELBOW COMPLETE 3+V*L*
1 series · 4 of 4 positions shown · non-contrast
Comparison: None.

CLINICAL DATA: Pain status post fall

EXAM:
LEFT ELBOW - COMPLETE 3+ VIEW

[Series 1: dg elbow complete left (3+view) · 0.14mm/px · 4 of 4 slices shown]
[im 1/4]
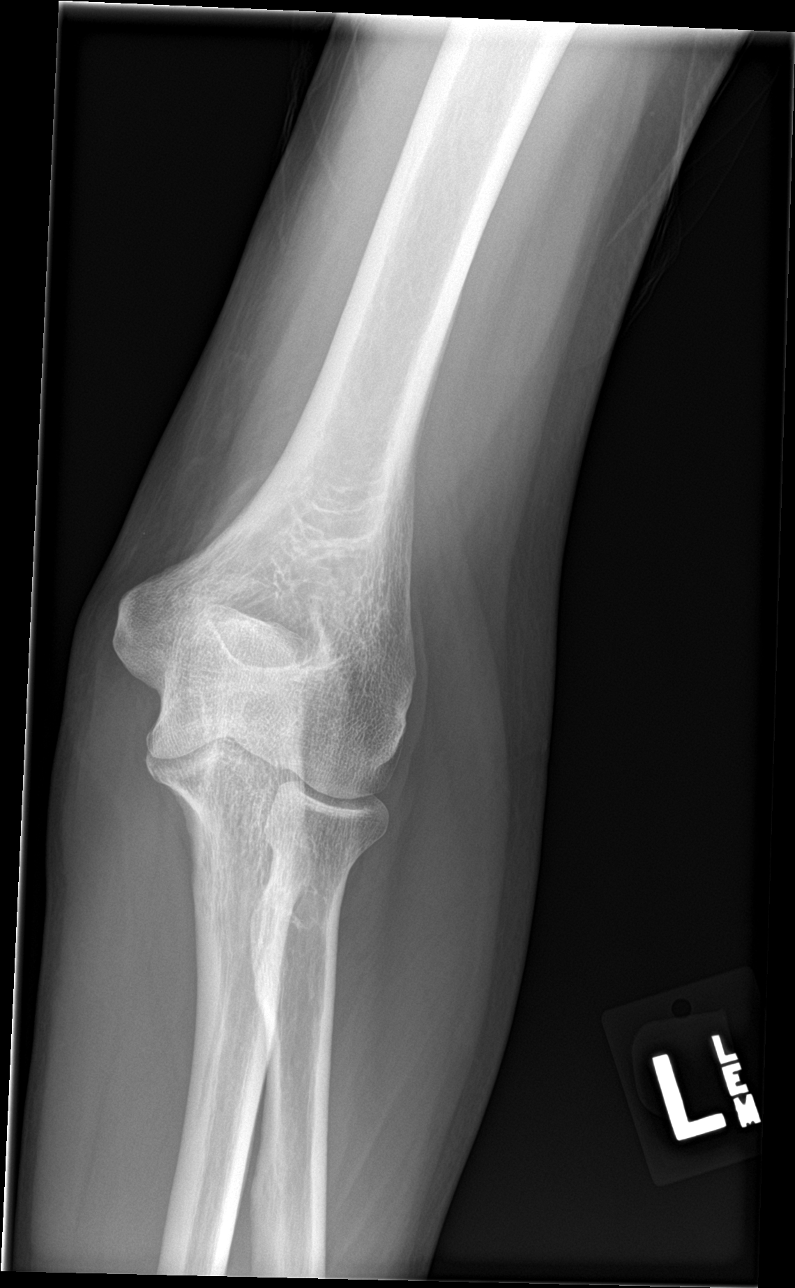
[im 2/4]
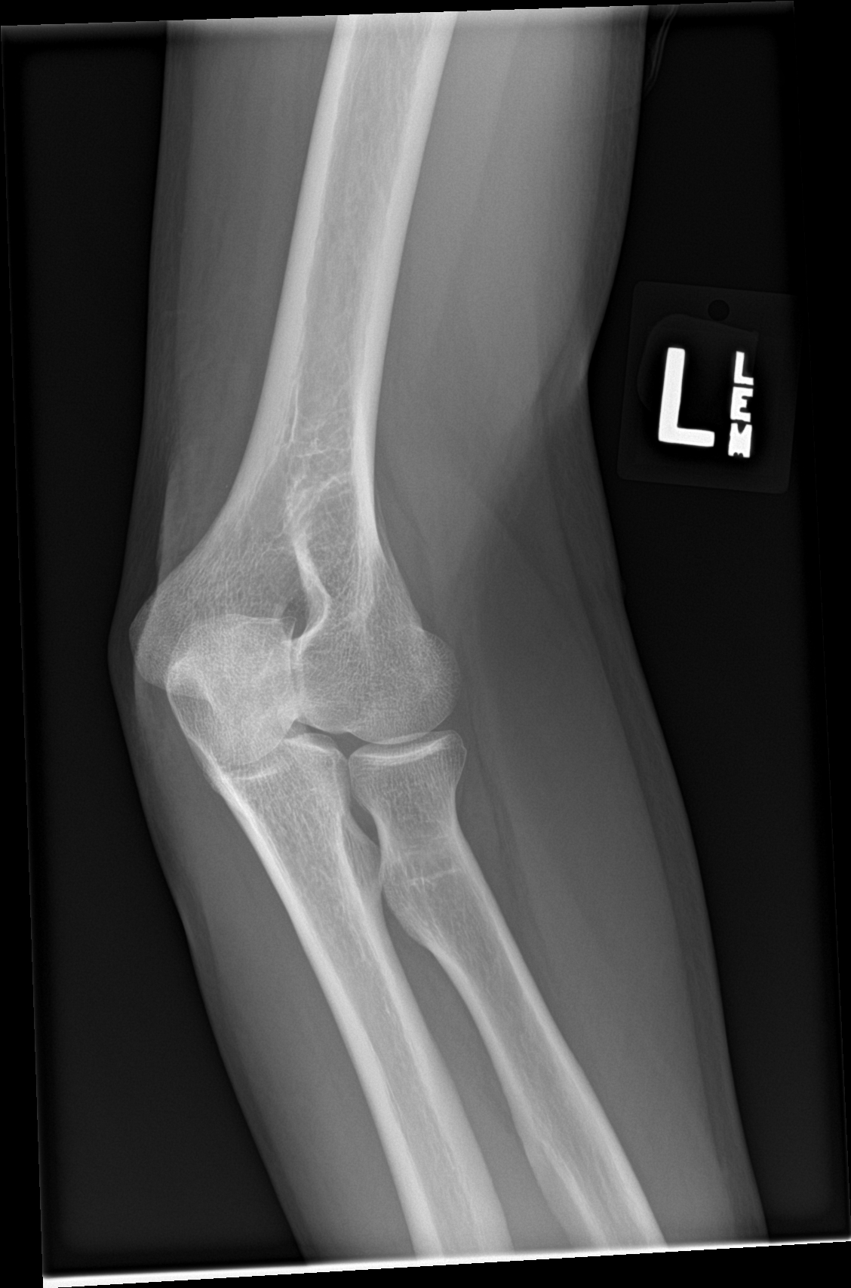
[im 3/4]
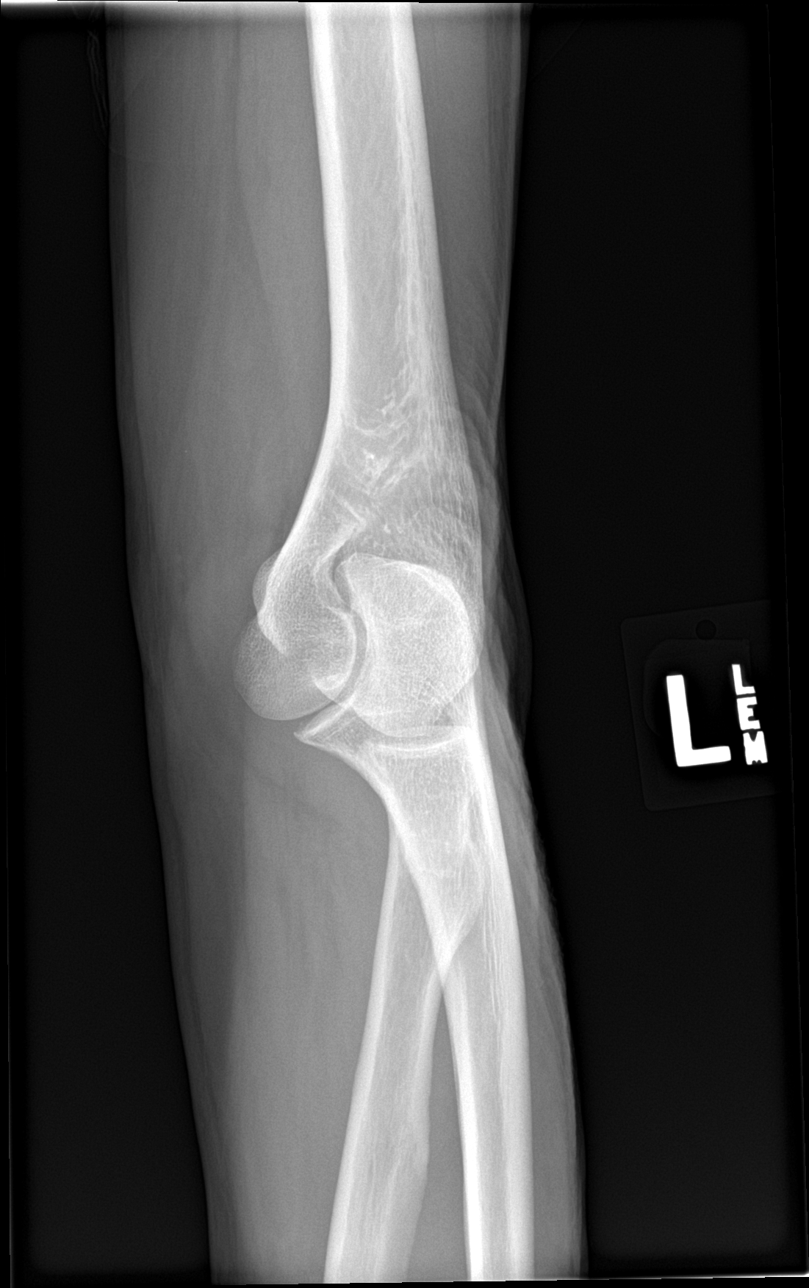
[im 4/4]
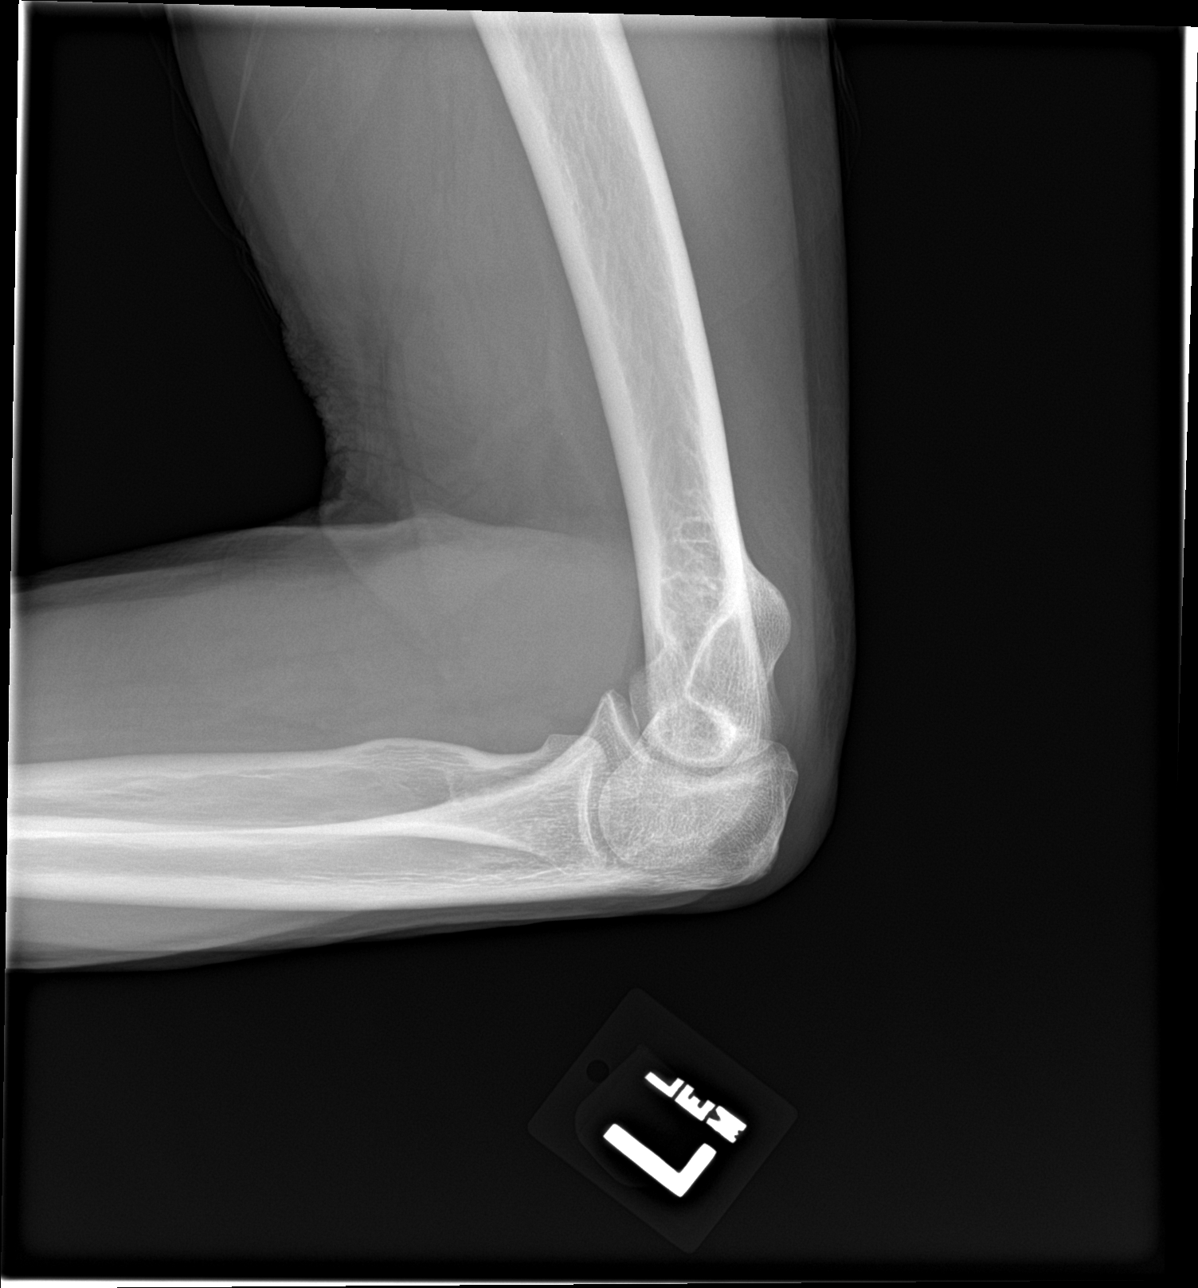

[4 of 4 positions shown; findings below may reference images not displayed]

FINDINGS: There is no evidence of fracture, dislocation, or joint effusion.
There is no evidence of arthropathy or other focal bone abnormality.
Soft tissues are unremarkable.
IMPRESSION: Negative.

## 2019-07-28 IMAGING — CR DG SHOULDER 2+V*L*
1 series · 3 of 3 positions shown · non-contrast
Comparison: None.

CLINICAL DATA: Pain status post fall

EXAM:
LEFT SHOULDER - 2+ VIEW

[Series 1: dg shoulder left · 0.14mm/px · 3 of 3 slices shown]
[im 1/3]
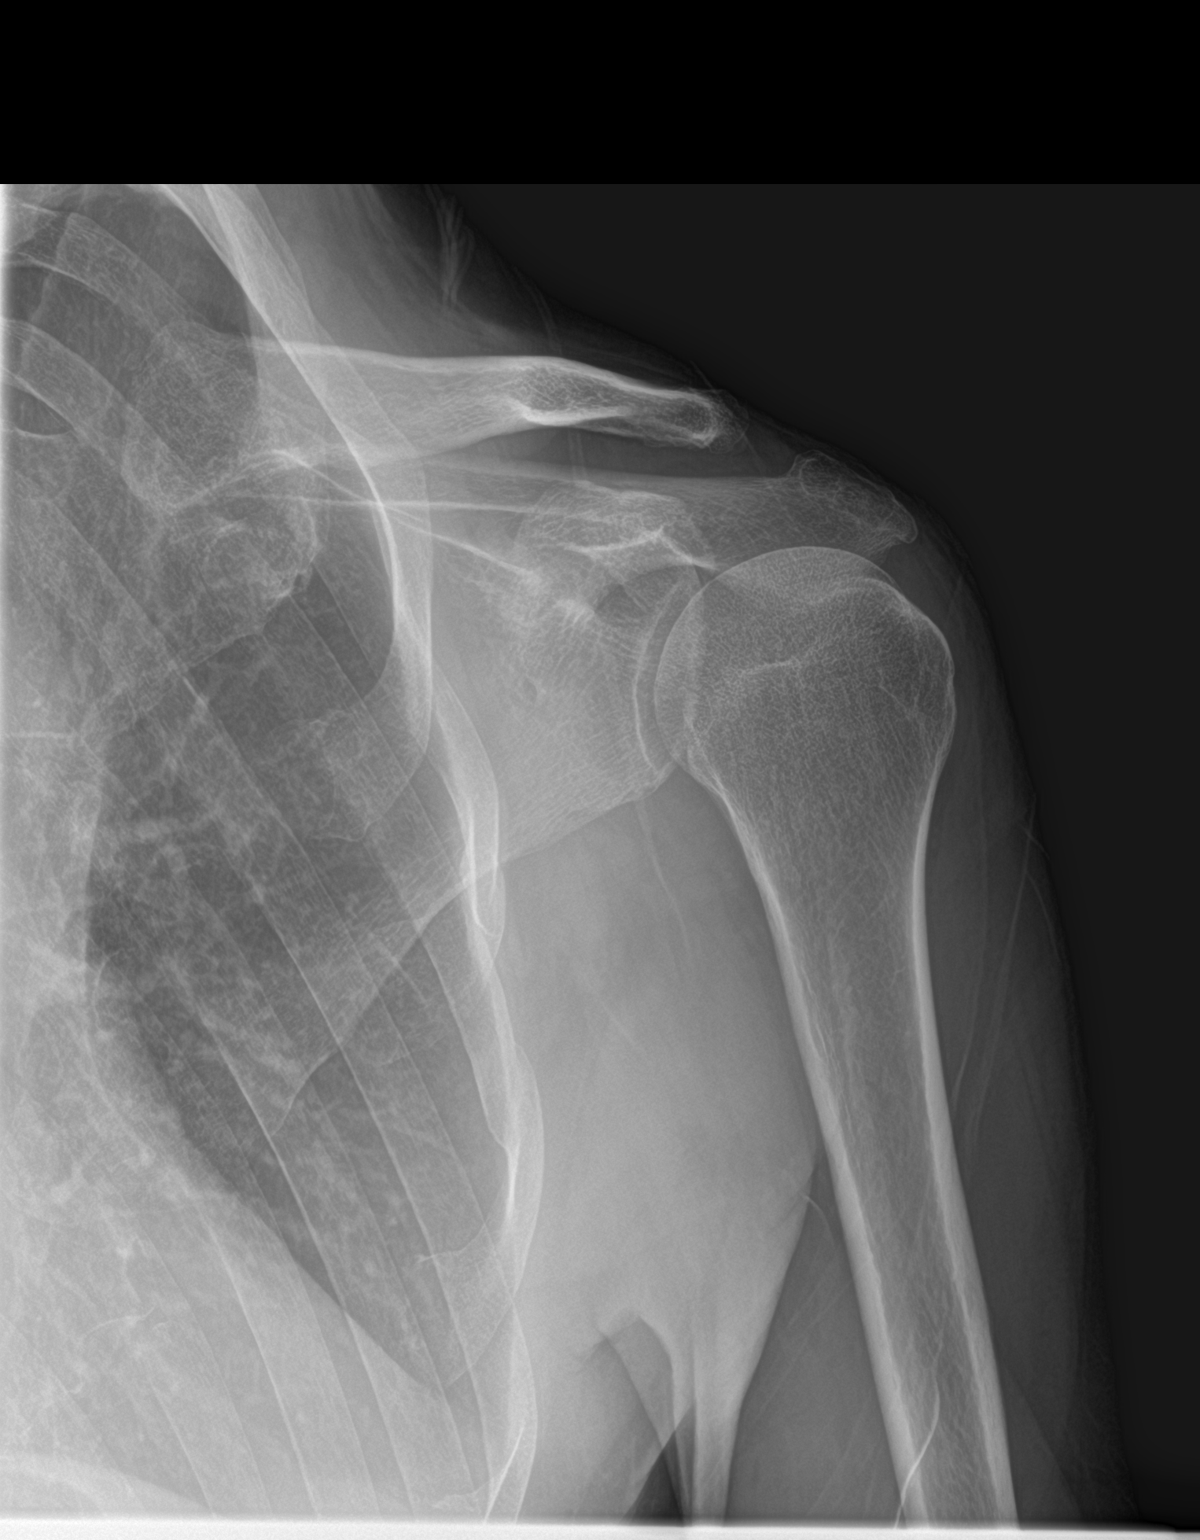
[im 2/3]
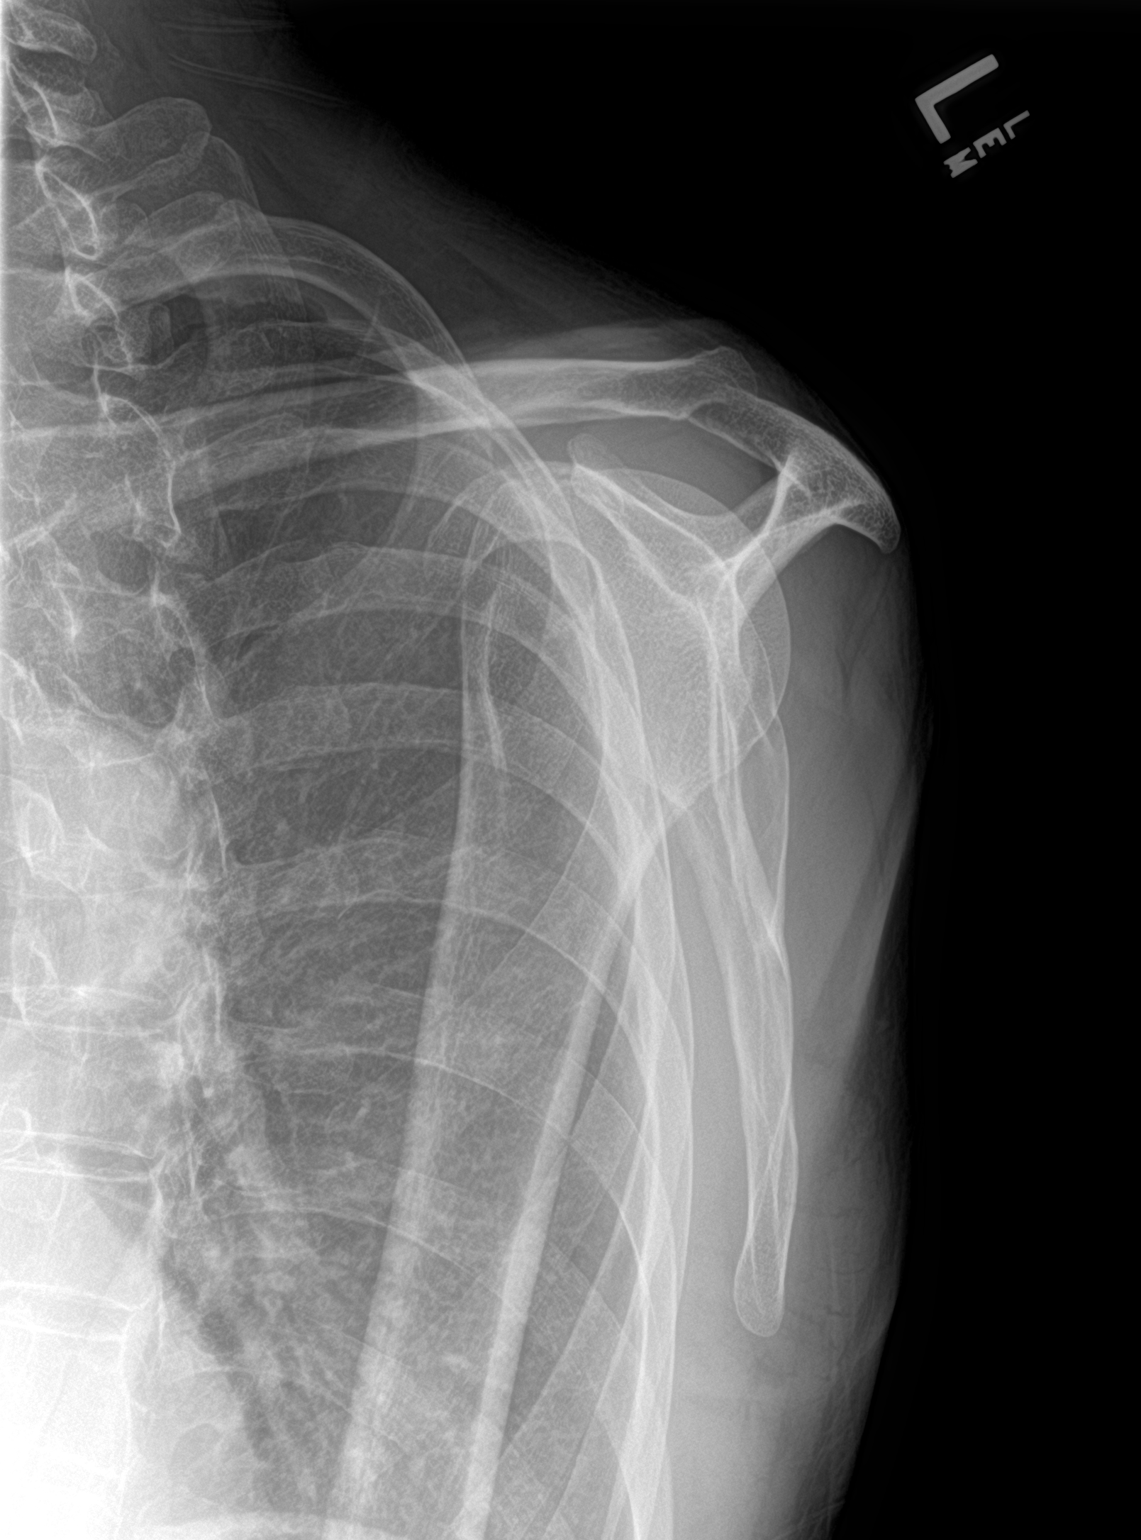
[im 3/3]
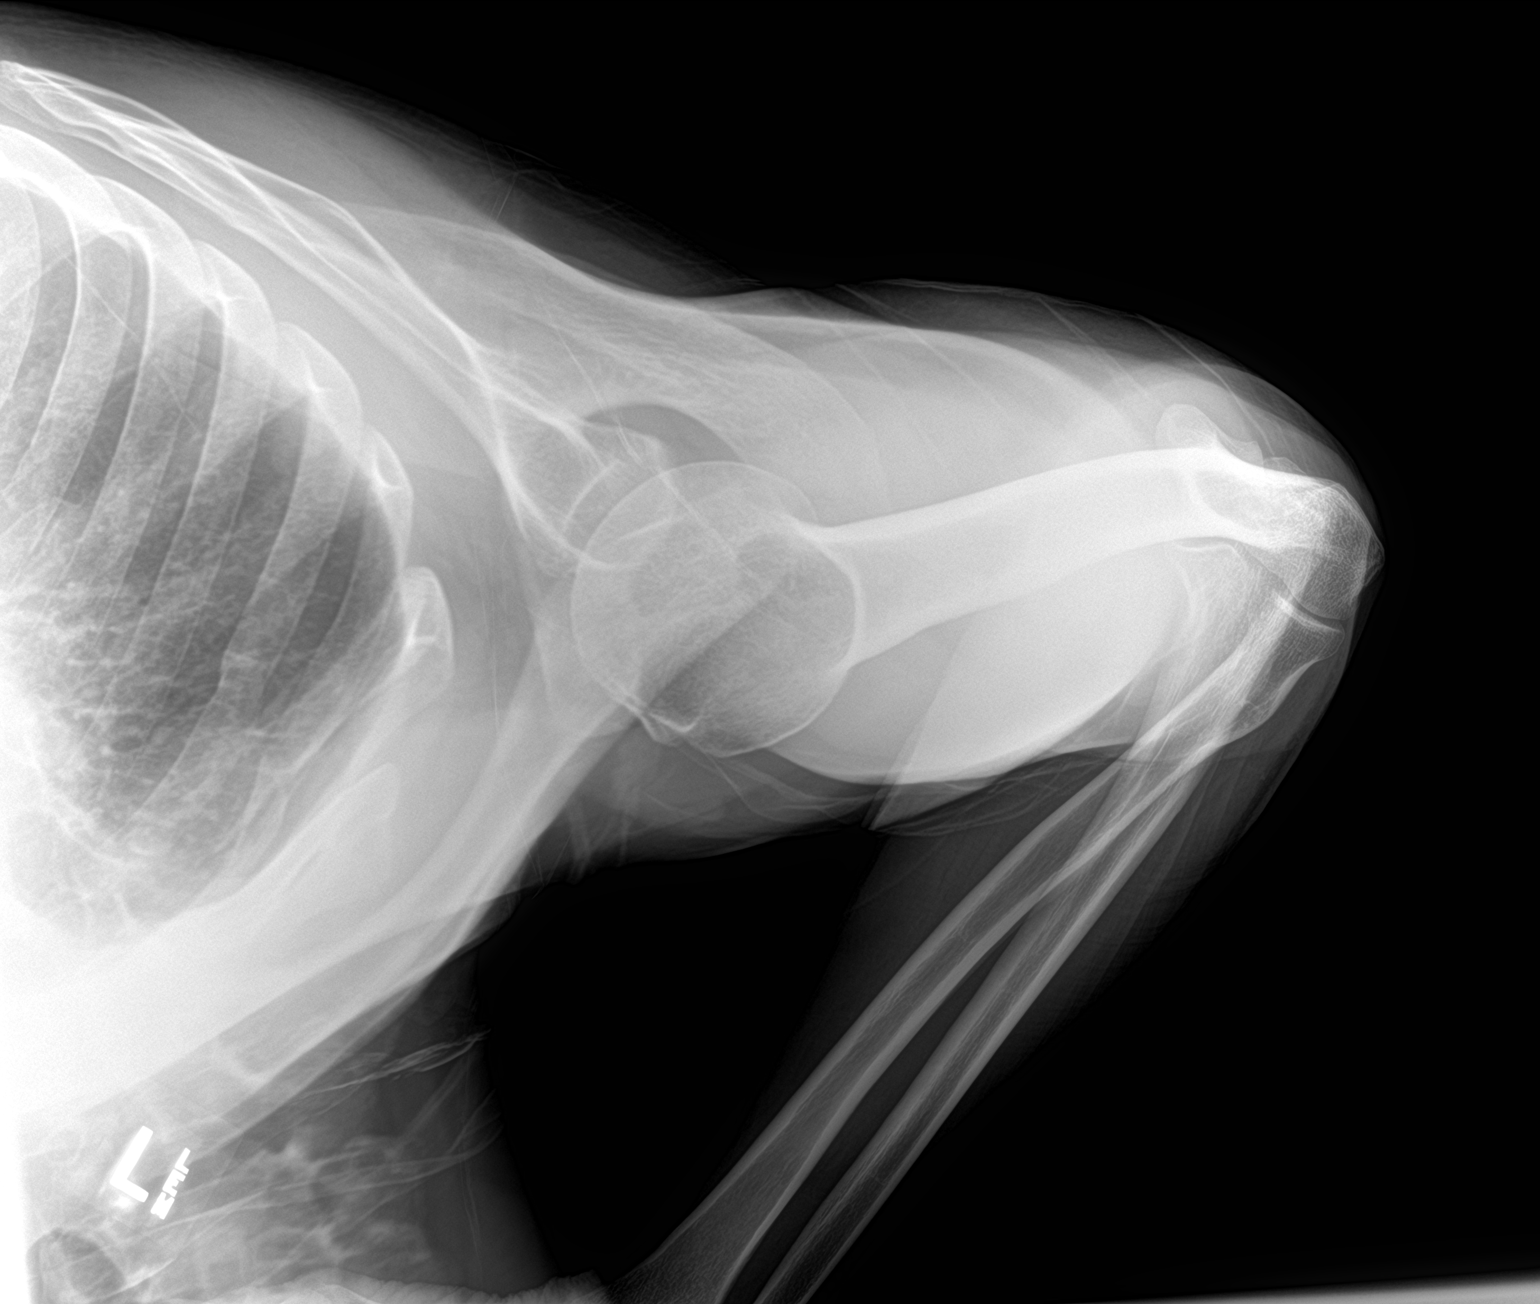

[3 of 3 positions shown; findings below may reference images not displayed]

FINDINGS: There is no evidence of fracture or dislocation. There is no
evidence of arthropathy or other focal bone abnormality. Soft
tissues are unremarkable. There are old healed left-sided rib
fractures.
IMPRESSION: Negative.

## 2019-07-28 IMAGING — CR DG CERVICAL SPINE 2 OR 3 VIEWS
1 series · 3 of 3 positions shown · non-contrast
Comparison: None.

CLINICAL DATA: Pain status post fall

EXAM:
CERVICAL SPINE - 2-3 VIEW

[Series 1: dg cervical spine 2 or 3 views · 0.14mm/px · 3 of 3 slices shown]
[im 1/3]
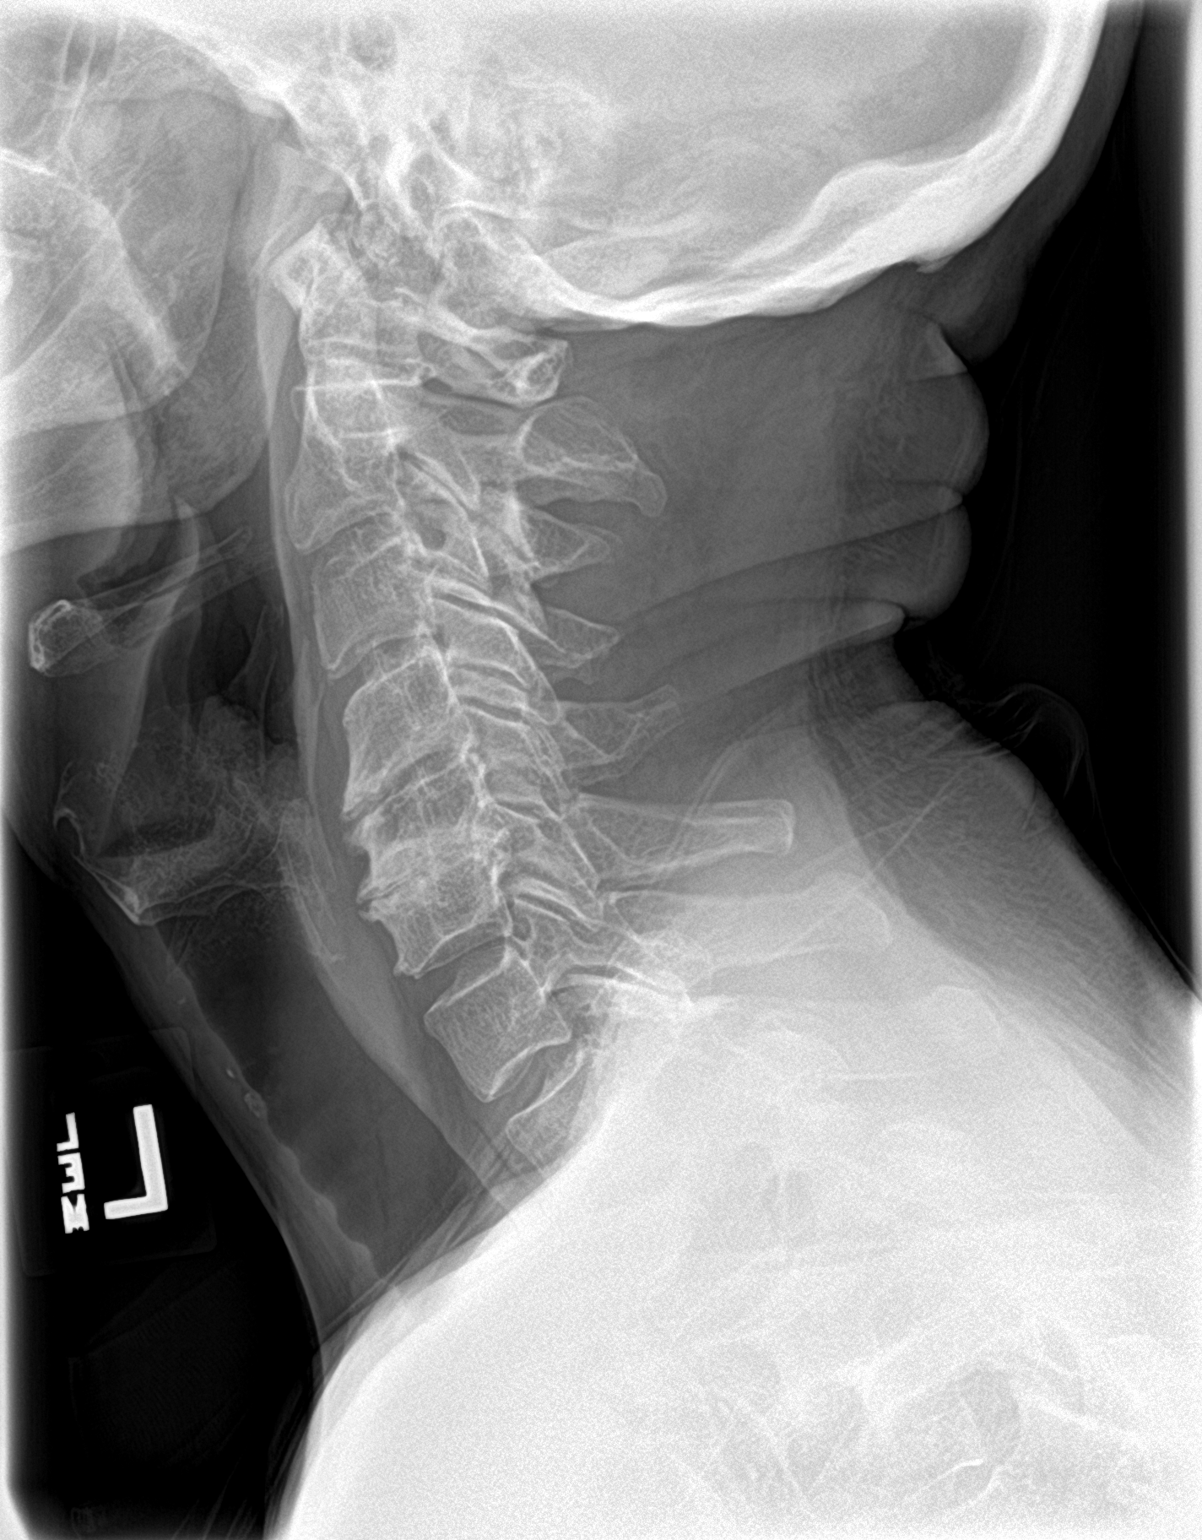
[im 2/3]
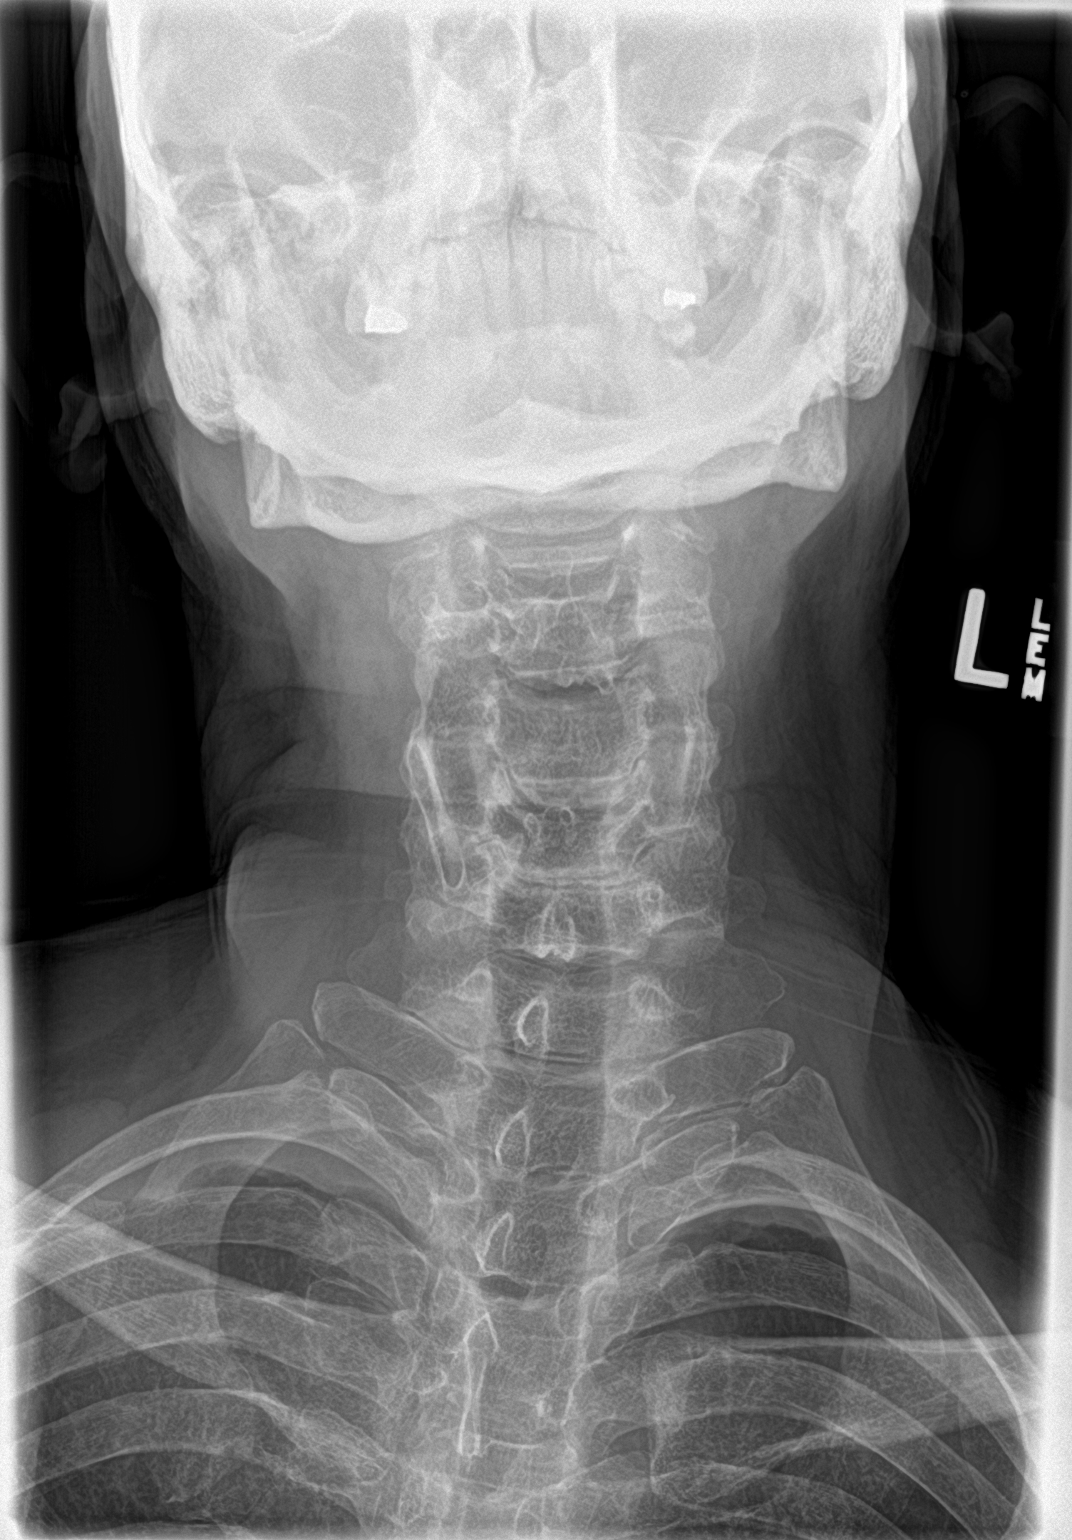
[im 3/3]
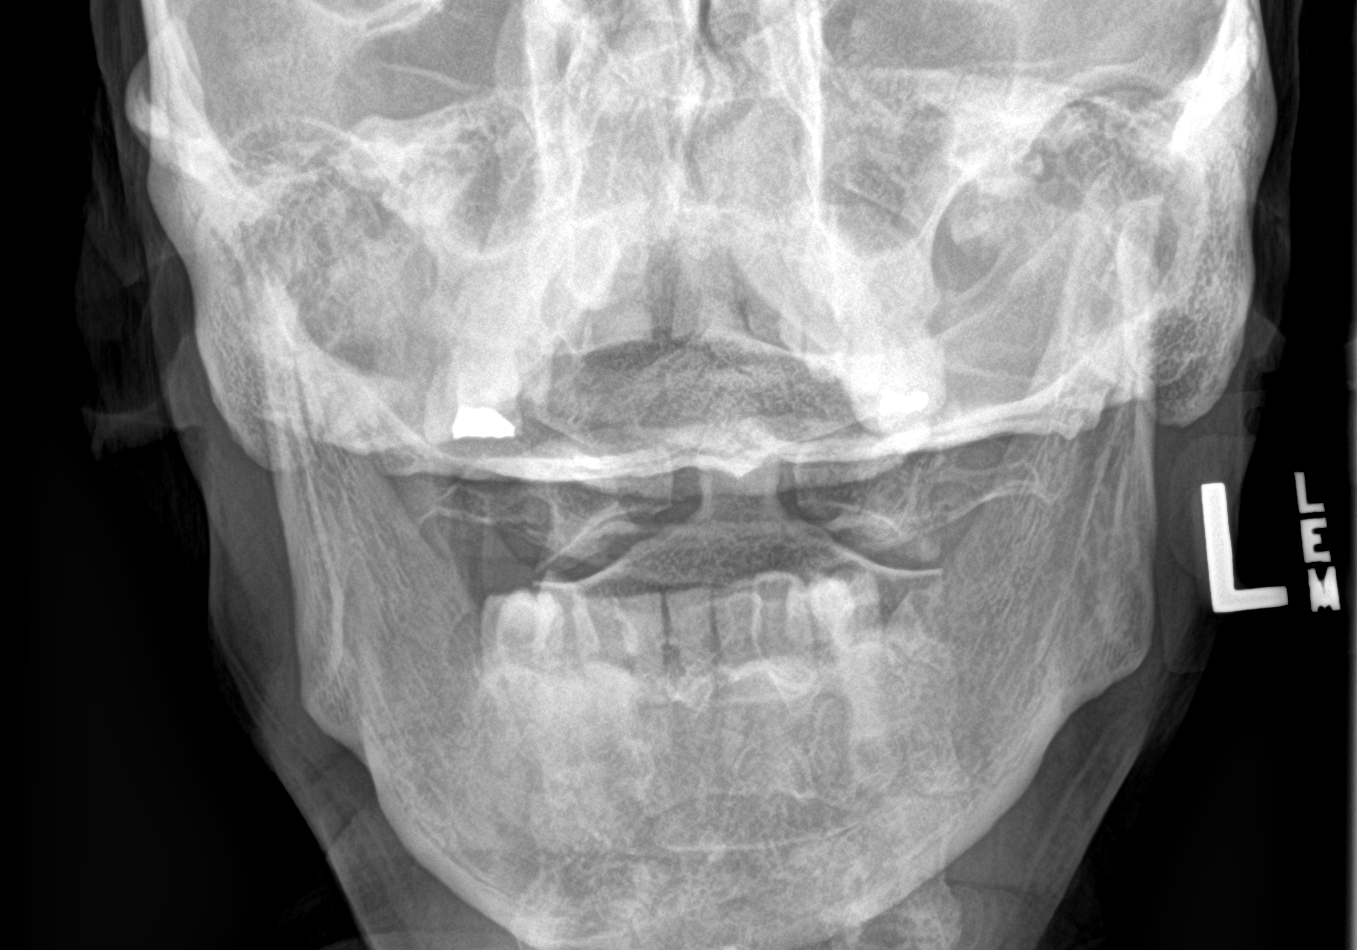

[3 of 3 positions shown; findings below may reference images not displayed]

FINDINGS: There is no significant prevertebral soft tissue swelling. There is
no displaced fracture. No dislocation. Advanced degenerative changes
are noted at the C4-C5 and C5-C6 levels.
IMPRESSION: No acute osseous abnormality.

## 2019-07-28 IMAGING — CR DG HAND COMPLETE 3+V*L*
1 series · 3 of 3 positions shown · non-contrast
Comparison: None.

CLINICAL DATA: Pain status post fall

EXAM:
LEFT HAND - COMPLETE 3+ VIEW

[Series 1: dg hand complete left · 0.14mm/px · 3 of 3 slices shown]
[im 1/3]
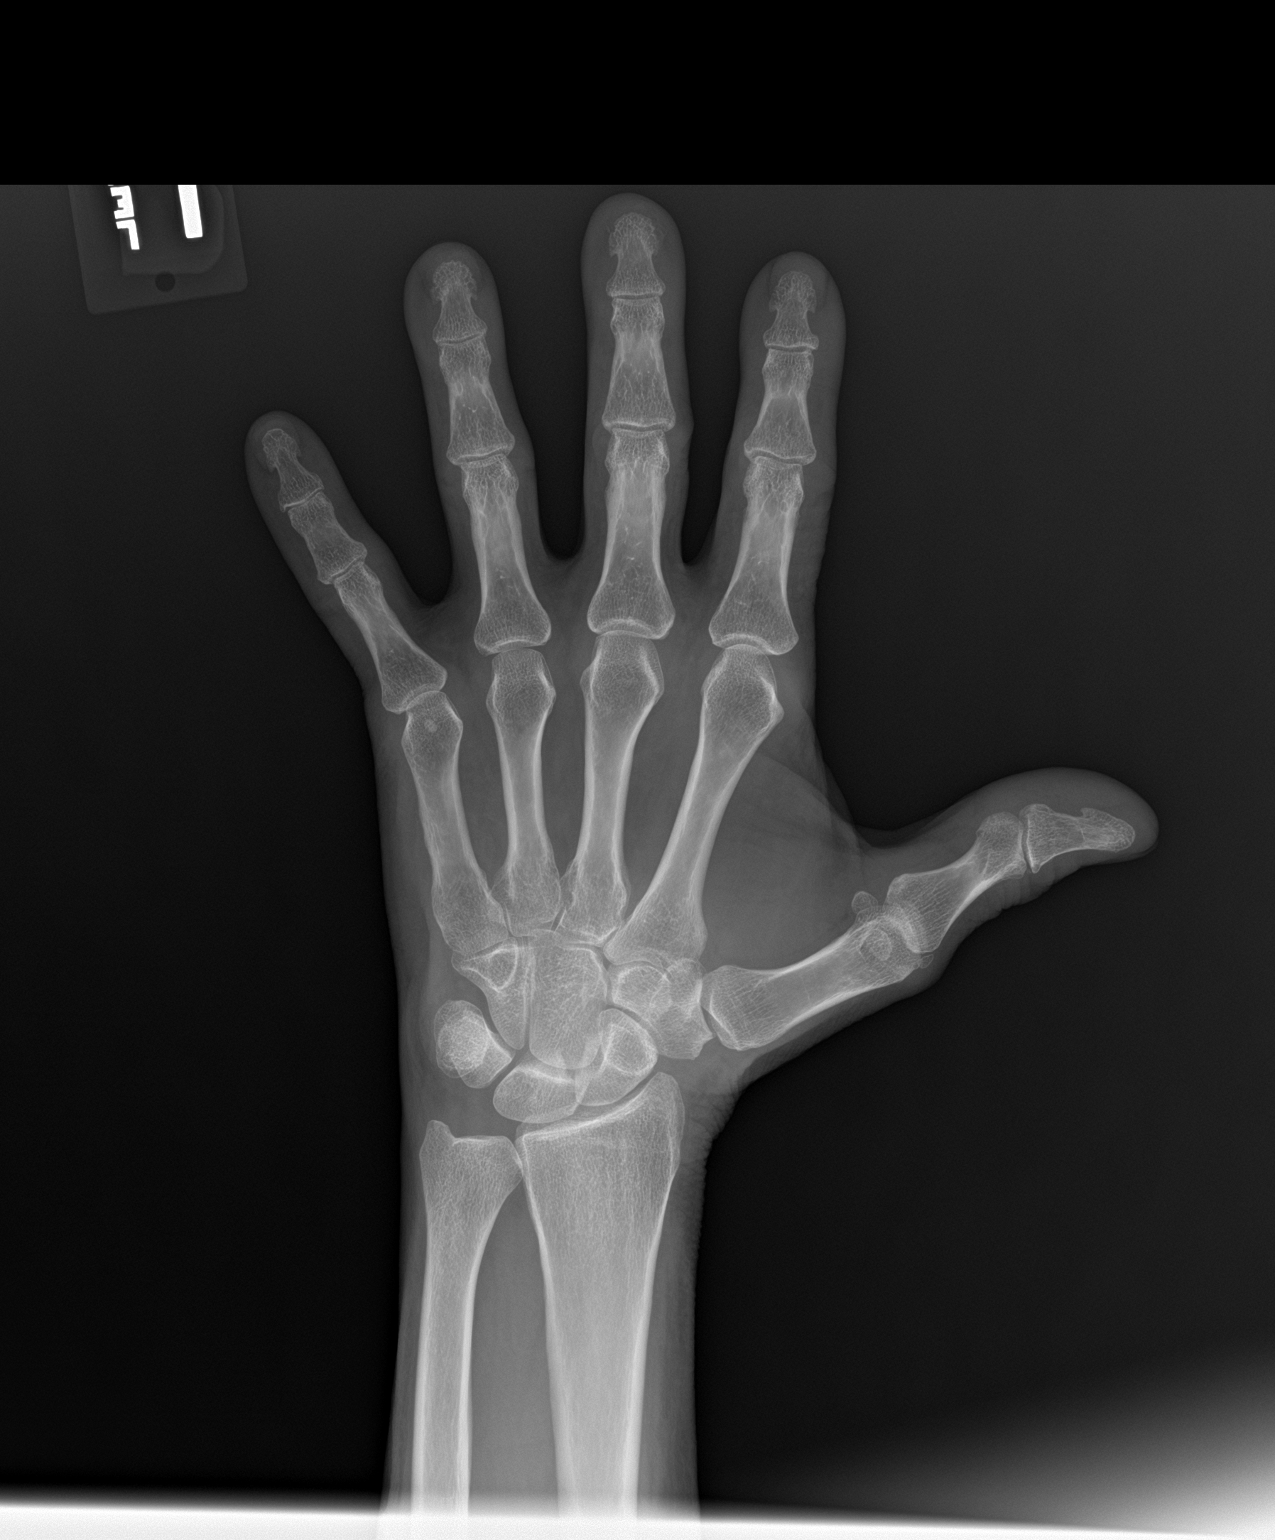
[im 2/3]
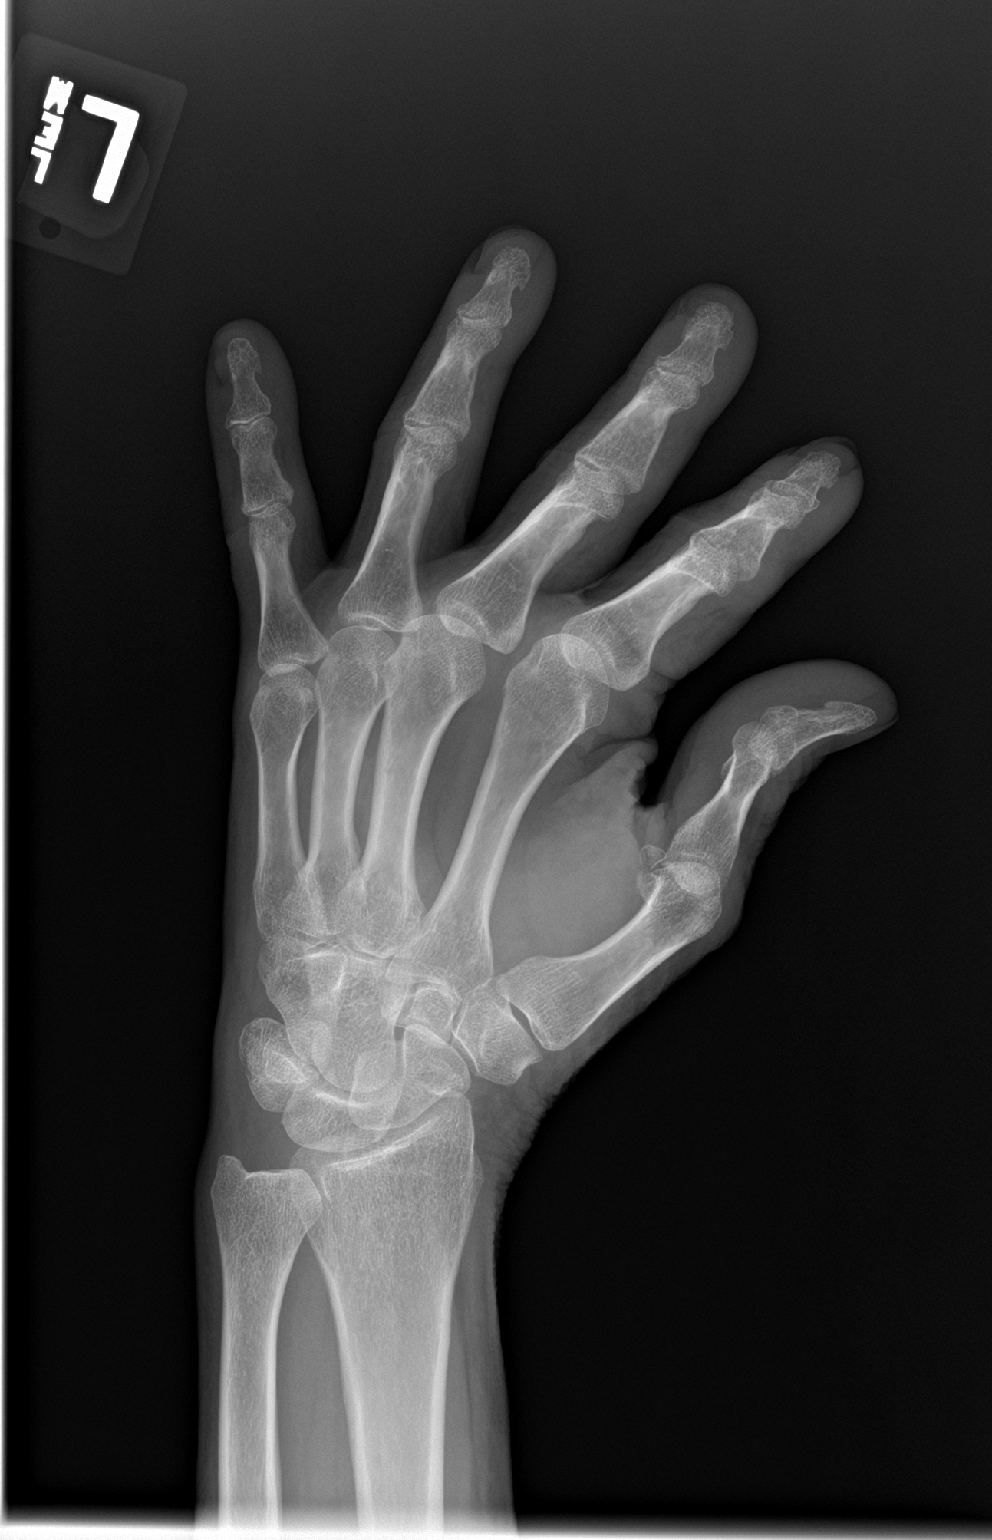
[im 3/3]
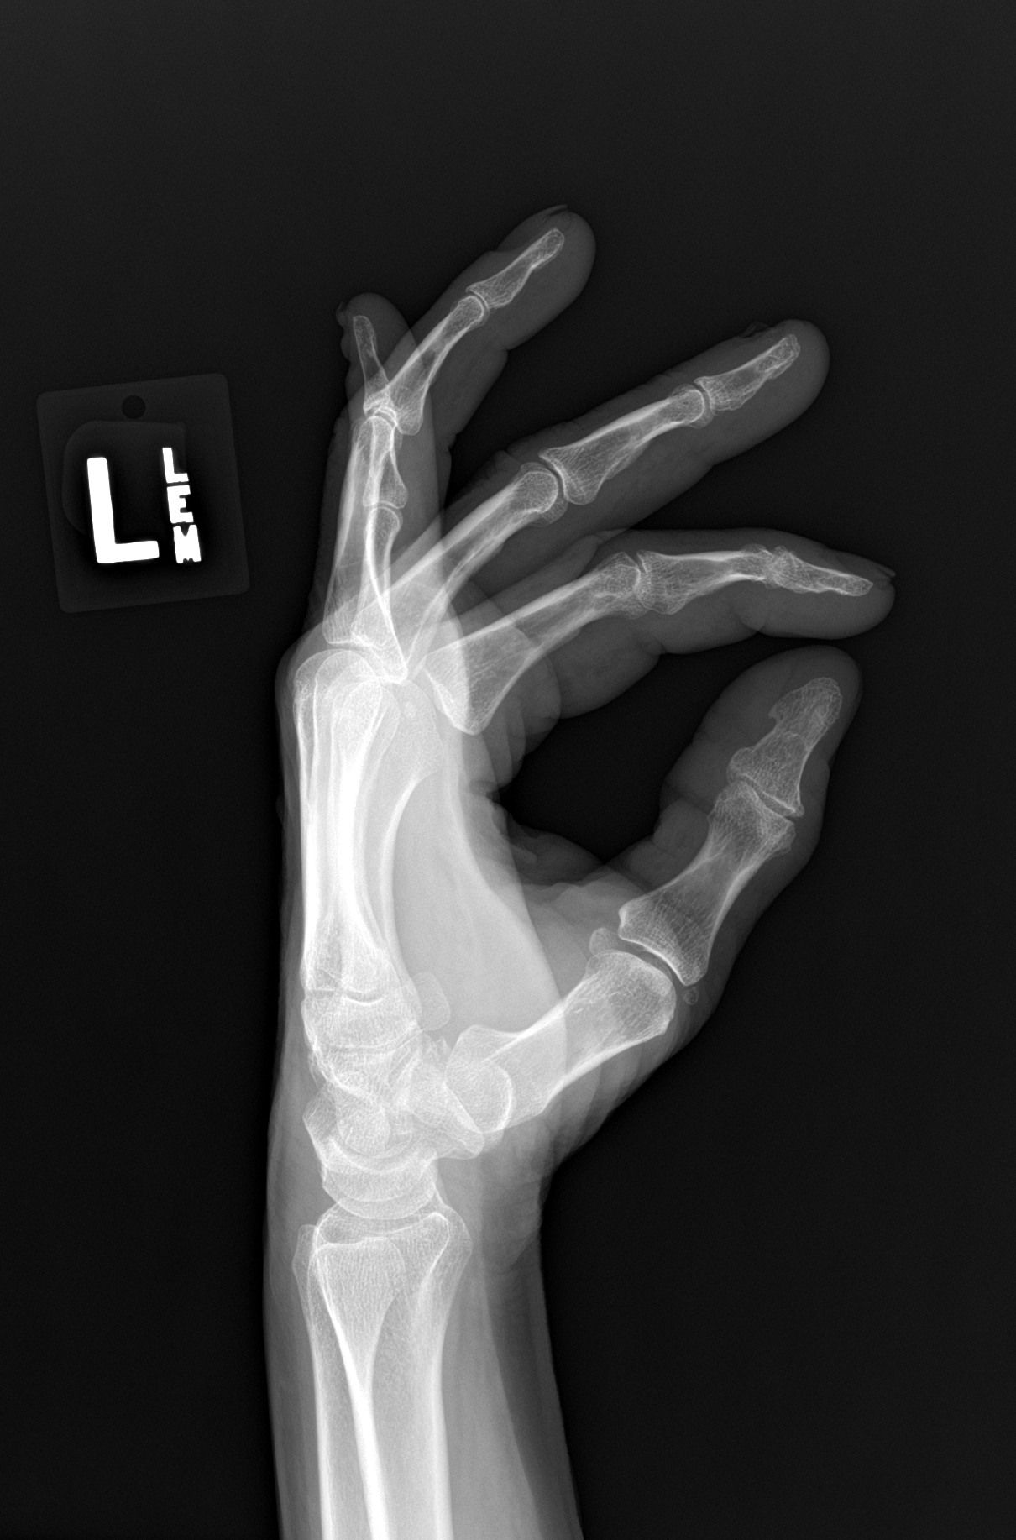

[3 of 3 positions shown; findings below may reference images not displayed]

FINDINGS: There is no evidence of fracture or dislocation. There is no
evidence of arthropathy or other focal bone abnormality. Soft
tissues are unremarkable.
IMPRESSION: Negative.

## 2019-07-28 MED ORDER — OXYCODONE-ACETAMINOPHEN 5-325 MG PO TABS: 1.0000 | ORAL_TABLET | Freq: Four times a day (QID) | ORAL | 0 refills | Status: DC | PRN

## 2019-07-28 MED ORDER — METHOCARBAMOL 500 MG PO TABS: 500.0000 mg | ORAL_TABLET | Freq: Four times a day (QID) | ORAL | 0 refills | Status: DC

## 2019-07-28 MED ORDER — PREDNISONE 50 MG PO TABS: 50.0000 mg | ORAL_TABLET | Freq: Every day | ORAL | 0 refills | Status: DC

## 2019-07-28 MED ORDER — OXYCODONE-ACETAMINOPHEN 5-325 MG PO TABS
1.0000 | ORAL_TABLET | Freq: Once | ORAL | Status: AC
  Administered 2019-07-28: 1 via ORAL
  Filled 2019-07-28: qty 1

## 2019-07-28 NOTE — ED Notes (Signed)
Spoke with patient's sister on the phone, Lenna Sciara, who states patient has had hx of speech problems "for a while".

## 2019-07-28 NOTE — ED Notes (Signed)
See triage note Presents s/p fall having pain to left shoulder and right hip area  States he is unsteady on his feet and uses a walker

## 2019-07-28 NOTE — ED Triage Notes (Signed)
Pt to ED from home c/o fall today.  States uses walker and tripped on spilled items in [employer] tipping walker over.  Denies hitting head, states pain to right hip and back, left shoulder elbow and hand.

## 2019-07-28 NOTE — ED Provider Notes (Signed)
[facility] Provider Note  ____________________________________________  Time seen: Approximately 6:30 PM  I have reviewed the triage vital signs and the nursing notes.   HISTORY  Chief Complaint Fall    HPI Shawn [name] is a 58 y.o. male who presents the emergency department for evaluation of neck, left shoulder, left elbow and left hand pain.  Patient is also complaining of right hip pain.  Patient uses a walker for ambulation.  He was at [employer], something had been spilled in the aisle and patient slipped falling striking his left arm against shelving and falling and landing on his right hip.  Patient did not hit his head or lose consciousness.  Patient is complaining of pain to his neck, left shoulder, left elbow, left hand, right hip.  No medications prior to arrival.  Patient is able to move all 4 extremities but stating that moving his left upper and right lower increases his pain.  Patient denies any headache, vision changes, chest pain, shortness of breath, abdominal pain, nausea or vomiting.  No medications for pain prior to arrival.         Past Medical History:  Diagnosis Date  . Asthma   . Renal stone     Patient Active Problem List   Diagnosis Date Noted  . Ureteral stone 05/20/2015    Past Surgical History:  Procedure Laterality Date  . CARDIAC CATHETERIZATION  2006   negative  . CYSTOSCOPY W/ URETERAL STENT REMOVAL Left 06/11/2015   Procedure: CYSTOSCOPY WITH STENT EXCHANGE;  Surgeon: Malen Gauze, MD;  Location: [facility] OR[facility];  Service: Urology;  Laterality: Left;  . CYSTOSCOPY WITH STENT PLACEMENT Left 05/20/2015   Procedure: CYSTOSCOPY WITH STENT PLACEMENT;  Surgeon: Crist Fat, MD;  Location: [facility] ORS;  Service: Urology;  Laterality: Left;  . CYSTOSCOPY/RETROGRADE/URETEROSCOPY/STONE EXTRACTION WITH BASKET  2000   pt. unsure of which side done  . CYSTOSCOPY/URETEROSCOPY/HOLMIUM LASER Left 06/11/2015    Procedure: CYSTOSCOPY/URETEROSCOPY/HOLMIUM LASER;  Surgeon: Malen Gauze, MD;  Location: [facility] OR[facility];  Service: Urology;  Laterality: Left;  . KNEE SURGERY Right 84,96, 2003   x3  . SHOULDER SURGERY Left 2010    Prior to Admission medications   Medication Sig Start Date End Date Taking? Authorizing Provider  albuterol (PROVENTIL HFA;VENTOLIN HFA) 108 (90 BASE) MCG/ACT inhaler Inhale 1-2 puffs into the lungs as needed for wheezing or shortness of breath.    [provider]  methocarbamol (ROBAXIN) 500 MG tablet Take 1 tablet (500 mg total) by mouth 4 (four) times daily. 07/28/19   Cuthriell, Delorise Royals, PA-C  oxybutynin (DITROPAN) 5 MG tablet Take 5 mg by mouth 3 (three) times daily.    [provider]  oxyCODONE-acetaminophen (PERCOCET/ROXICET) 5-325 MG tablet Take 1 tablet by mouth every 6 (six) hours as needed for severe pain. 07/28/19   Cuthriell, Delorise Royals, PA-C  predniSONE (DELTASONE) 50 MG tablet Take 1 tablet (50 mg total) by mouth daily with breakfast. 07/28/19   Cuthriell, Delorise Royals, PA-C  tamsulosin (FLOMAX) 0.4 MG CAPS capsule Take 1 capsule (0.4 mg total) by mouth daily after supper. 05/20/15   Milagros Loll, MD    Allergies Aspirin, Morphine and related, Naprosyn [naproxen], and Penicillins  Family History  Problem Relation Age of Onset  . Cancer Mother   . Heart failure Mother   . Hypertension Mother   . CVA Mother   . Diabetes Other     Social History Social History   Tobacco Use  .  Smoking status: Never Smoker  . Smokeless tobacco: Never Used  Substance Use Topics  . Alcohol use: Yes    Alcohol/week: 0.0 standard drinks    Comment: occasionally  . Drug use: Never     Review of Systems  Constitutional: No fever/chills Eyes: No visual changes. No discharge ENT: No upper respiratory complaints. Cardiovascular: no chest pain. Respiratory: no cough. No SOB. Gastrointestinal: No abdominal pain.  No nausea, no vomiting.  No diarrhea.  No  constipation. Musculoskeletal: Positive for neck, left shoulder, left elbow, left hand pain.  Positive for right hip pain. Skin: Negative for rash, abrasions, lacerations, ecchymosis. Neurological: Negative for headaches, focal weakness or numbness. 10-point ROS otherwise negative.  ____________________________________________   PHYSICAL EXAM:  VITAL SIGNS: ED Triage Vitals  Enc Vitals Group     BP 07/28/19 1612 138/90     Pulse Rate 07/28/19 1612 74     Resp 07/28/19 1612 14     Temp 07/28/19 1612 98.3 F (36.8 C)     Temp Source 07/28/19 1612 Oral     SpO2 07/28/19 1612 97 %     Weight 07/28/19 1613 150 lb (68 kg)     Height 07/28/19 1613 5\' 10"  (1.778 m)     Head Circumference --      Peak Flow --      Pain Score 07/28/19 1613 10     Pain Loc --      Pain Edu? --      Excl. in GC? --      Constitutional: Alert and oriented. Well appearing and in no acute distress. Eyes: Conjunctivae are normal. PERRL. EOMI. Head: Atraumatic.  No visible signs of trauma.  No tenderness to palpation of the osseous structures of the skull and face.  No battle signs, raccoon eyes, serosanguineous fluid drainage from the ears or nares. ENT:      Ears:       Nose: No congestion/rhinnorhea.      Mouth/Throat: Mucous membranes are moist.  Neck: No stridor.  Positive cervical spine tenderness to palpation.  Patient is tender palpation over the C5-C6 region.  No palpable abnormality or step-off.  Cardiovascular: Normal rate, regular rhythm. Normal S1 and S2.  Good peripheral circulation. Respiratory: Normal respiratory effort without tachypnea or retractions. Lungs CTAB. Good air entry to the bases with no decreased or absent breath sounds. Musculoskeletal: Patient is able to move all 4 extremities, limited range of motion to the left upper and right lower extremity due to pain.. No gross deformities appreciated.  Patient is diffusely tender to palpation over the left shoulder.  No specific area of  tenderness greater than others.  No palpable abnormality or deficits in this region.  Patient is tender to palpation of the lateral and posterior aspects of the left elbow.  No deformity.  Good range of motion to the elbow.  No tenderness to palpation.  With mild global tenderness to the left hand upon palpation.  No palpable abnormality or deficit.  Patient is able to move the wrist and all digits appropriately.  Radial pulse intact.  Sensation intact and equal bilateral upper extremities.  Examination of the right hip reveals no deformity.  No shortening or rotation of the right lower extremity.  Patient is tender to palpation over the SI joint as well as the lateral hip over the greater trochanter region.  No palpable abnormalities or deficits.  Examination of the lumbar spine, left knee and ankle are unremarkable.  Dorsalis pedis pulse  intact bilateral lower extremities.  Sensation intact and equal bilateral lower extremities. Neurologic:  Normal speech and language. No gross focal neurologic deficits are appreciated.  Skin:  Skin is warm, dry and intact. No rash noted. Psychiatric: Mood and affect are normal.  Speech is somewhat halting, this is baseline for the patient.  And behavior are normal.  Behavior is normal for the patient.  Patient exhibits appropriate insight and judgment   ____________________________________________   LABS (all labs ordered are listed, but only abnormal results are displayed)  Labs Reviewed - No data to display ____________________________________________  EKG   ____________________________________________  RADIOLOGY I personally viewed and evaluated these images as part of my medical decision making, as well as reviewing the written report by the radiologist.  Dg Cervical Spine 2-3 Views  Result Date: 07/28/2019 CLINICAL DATA:  Pain status post fall EXAM: CERVICAL SPINE - 2-3 VIEW COMPARISON:  None. FINDINGS: There is no significant prevertebral soft  tissue swelling. There is no displaced fracture. No dislocation. Advanced degenerative changes are noted at the C4-C5 and C5-C6 levels. IMPRESSION: No acute osseous abnormality. Electronically Signed   By: Constance Holster M.D.   On: 07/28/2019 17:48   Dg Elbow Complete Left  Result Date: 07/28/2019 CLINICAL DATA:  Pain status post fall EXAM: LEFT ELBOW - COMPLETE 3+ VIEW COMPARISON:  None. FINDINGS: There is no evidence of fracture, dislocation, or joint effusion. There is no evidence of arthropathy or other focal bone abnormality. Soft tissues are unremarkable. IMPRESSION: Negative. Electronically Signed   By: Constance Holster M.D.   On: 07/28/2019 17:46   Dg Shoulder Left  Result Date: 07/28/2019 CLINICAL DATA:  Pain status post fall EXAM: LEFT SHOULDER - 2+ VIEW COMPARISON:  None. FINDINGS: There is no evidence of fracture or dislocation. There is no evidence of arthropathy or other focal bone abnormality. Soft tissues are unremarkable. There are old healed left-sided rib fractures. IMPRESSION: Negative. Electronically Signed   By: Constance Holster M.D.   On: 07/28/2019 17:46   Dg Hand Complete Left  Result Date: 07/28/2019 CLINICAL DATA:  Pain status post fall EXAM: LEFT HAND - COMPLETE 3+ VIEW COMPARISON:  None. FINDINGS: There is no evidence of fracture or dislocation. There is no evidence of arthropathy or other focal bone abnormality. Soft tissues are unremarkable. IMPRESSION: Negative. Electronically Signed   By: Constance Holster M.D.   On: 07/28/2019 17:47   Dg Hip Unilat With Pelvis 2-3 Views Right  Result Date: 07/28/2019 CLINICAL DATA:  Pain status post fall EXAM: DG HIP (WITH OR WITHOUT PELVIS) 2-3V RIGHT COMPARISON:  None. FINDINGS: There is no evidence of hip fracture or dislocation. There is no evidence of arthropathy or other focal bone abnormality. IMPRESSION: Negative. Electronically Signed   By: Constance Holster M.D.   On: 07/28/2019 17:49     ____________________________________________    PROCEDURES  Procedure(s) performed:    Procedures    Medications  oxyCODONE-acetaminophen (PERCOCET/ROXICET) 5-325 MG per tablet 1 tablet (1 tablet Oral Given 07/28/19 1750)     ____________________________________________   INITIAL IMPRESSION / ASSESSMENT AND PLAN / ED COURSE  Pertinent labs & imaging results that were available during my care of the patient were reviewed by me and considered in my medical decision making (see chart for details).  Review of the [place] CSRS was performed in accordance of the [place] prior to dispensing any controlled drugs.           Patient's diagnosis is consistent with fall multiple contusions.  Patient presented to the emergency department after a fall.  Patient was using his walker when he slipped on something that was spilled on the floor of [employer].  Patient fell striking the shelving with his left upper extremity falling and landing on his right hip.  Overall exam is reassuring.  Imaging reveals no acute traumatic findings.  Discussed with patient imaging results.  Patient will be placed on short course of steroid, muscle aches and pain medication for improvement of symptoms.  Follow-up primary care as needed.  No further work-up necessary at this time..  Patient is given ED precautions to return to the ED for any worsening or new symptoms.     ____________________________________________  FINAL CLINICAL IMPRESSION(S) / ED DIAGNOSES  Final diagnoses:  Fall, initial encounter  Multiple contusions      NEW MEDICATIONS STARTED DURING THIS VISIT:  ED Discharge Orders         Ordered    predniSONE (DELTASONE) 50 MG tablet  Daily with breakfast     07/28/19 1837    methocarbamol (ROBAXIN) 500 MG tablet  4 times daily     07/28/19 1837    oxyCODONE-acetaminophen (PERCOCET/ROXICET) 5-325 MG tablet  Every 6 hours PRN     07/28/19 1837              This chart was dictated  using voice recognition software/Dragon. Despite best efforts to proofread, errors can occur which can change the meaning. Any change was purely unintentional.    Racheal PatchesCuthriell, Jonathan D, PA-C 07/28/19 Madelynn [id number]    Malinda, Paul F, MD 07/28/19 [phone]

## 2020-02-05 ENCOUNTER — Other Ambulatory Visit: Payer: Self-pay

## 2020-02-05 ENCOUNTER — Ambulatory Visit: Admission: EM | Admit: 2020-02-05 | Discharge: 2020-02-05 | Disposition: A | Discharge status: Home / Self Care

## 2020-02-09 ENCOUNTER — Ambulatory Visit: Payer: Self-pay | Admitting: Nurse Practitioner

## 2020-04-23 ENCOUNTER — Other Ambulatory Visit: Payer: Self-pay | Admitting: Physician Assistant

## 2020-04-23 DIAGNOSIS — I69391 Dysphagia following cerebral infarction: Secondary | ICD-10-CM

## 2020-07-18 ENCOUNTER — Ambulatory Visit
Admission: EM | Admit: 2020-07-18 | Discharge: 2020-07-18 | Disposition: A | Discharge status: Other Acute Inpatient Hospital | Payer: Medicare Other

## 2020-07-18 ENCOUNTER — Other Ambulatory Visit: Payer: Self-pay

## 2020-07-18 ENCOUNTER — Encounter: Payer: Self-pay | Admitting: Emergency Medicine

## 2020-07-18 DIAGNOSIS — S0990XA Unspecified injury of head, initial encounter: Secondary | ICD-10-CM

## 2020-07-18 DIAGNOSIS — R42 Dizziness and giddiness: Secondary | ICD-10-CM

## 2020-07-18 DIAGNOSIS — H539 Unspecified visual disturbance: Secondary | ICD-10-CM

## 2020-07-18 HISTORY — DX: Hyperlipidemia, unspecified: E78.5

## 2020-07-18 HISTORY — DX: Cerebral infarction, unspecified: I63.9

## 2020-07-18 HISTORY — DX: Atherosclerotic heart disease of native coronary artery without angina pectoris: I25.10

## 2020-07-18 NOTE — Discharge Instructions (Addendum)

## 2020-07-18 NOTE — ED Triage Notes (Signed)
Patient in today c/o dizziness, blurry vision and sob that started yesterday. Patient states he fell in the bathroom and landed in the bathrub on 07/15/20 and hit the back of his head.

## 2020-07-18 NOTE — ED Triage Notes (Signed)
Patient is being discharged from the Urgent Care and sent to the Emergency Department via POV . Per Athena Masse, PA, patient is in need of higher level of care due to head trauma, dizziness, blurry vision. Patient is aware and verbalizes understanding of plan of care.  Vitals:   07/18/20 1007  BP: 127/79  Pulse: 66  Resp: 18  Temp: 97.9 F (36.6 C)  SpO2: 100%

## 2020-07-18 NOTE — ED Provider Notes (Signed)
[facility]    CSN: [id number] Arrival date & time: 07/18/20  0948      History   Chief Complaint Chief Complaint  Patient presents with  . Fall    DOI 07/15/20  . Dizziness  . Blurred Vision    HPI Shawn [name] is a 59 y.o. male   presenting for severe headache of the right and posterior side of his head following a fall 3 days ago.  He says that he lost his balance and fell backwards and hit his head on the bathroom floor in tub.  He denies any loss of consciousness.  He says he has had severe 10 out of 10 headaches since which has not been relieved by Tylenol.  Patient says the headaches keep him up at night and he is unable to sleep due to the pain.  Patient also experiencing new onset of dizziness and blurred vision yesterday.  He says that he did have a stroke a couple of years ago that left him with some deficits including slow speech and balance issues.  Patient also has a history of coronary artery disease and asthma.  Patient does not report any other injuries from the fall.  He denies any swelling or bruising.  He denies any nausea or vomiting.  Denies confusion.  He feels like his balance may be a little bit worse.  Denies any numbness or tingling or weakness.  Patient denies chest pain but does admit to some shortness of breath that also started yesterday.  Patient did have Plavix listed in his medication list, but he is uncertain if he is taking that or not.  Denies use of other anticoagulants.  Patient has no other injuries, complaints or concerns today.  HPI  Past Medical History:  Diagnosis Date  . Asthma   . Coronary artery disease   . Hyperlipidemia   . Renal stone   . Stroke [facility])     Patient Active Problem List   Diagnosis Date Noted  . Ureteral stone 05/20/2015    Past Surgical History:  Procedure Laterality Date  . CARDIAC CATHETERIZATION  2006   negative  . CYSTOSCOPY W/ URETERAL STENT REMOVAL Left 06/11/2015   Procedure: CYSTOSCOPY  WITH STENT EXCHANGE;  Surgeon: Malen Gauze, MD;  Location: [facility] OR[facility];  Service: Urology;  Laterality: Left;  . CYSTOSCOPY WITH STENT PLACEMENT Left 05/20/2015   Procedure: CYSTOSCOPY WITH STENT PLACEMENT;  Surgeon: Crist Fat, MD;  Location: [facility] OR[facility];  Service: Urology;  Laterality: Left;  . CYSTOSCOPY/RETROGRADE/URETEROSCOPY/STONE EXTRACTION WITH BASKET  2000   pt. unsure of which side done  . CYSTOSCOPY/URETEROSCOPY/HOLMIUM LASER Left 06/11/2015   Procedure: CYSTOSCOPY/URETEROSCOPY/HOLMIUM LASER;  Surgeon: Malen Gauze, MD;  Location: [facility];  Service: Urology;  Laterality: Left;  . KNEE SURGERY Right 84,96, 2003   x3  . SHOULDER SURGERY Left 2010       Home Medications    Prior to Admission medications   Medication Sig Start Date End Date Taking? Authorizing Provider  albuterol (PROVENTIL HFA;VENTOLIN HFA) 108 (90 BASE) MCG/ACT inhaler Inhale 1-2 puffs into the lungs as needed for wheezing or shortness of breath.   Yes [provider]  atorvastatin (LIPITOR) 20 MG tablet Take 20 mg by mouth daily. 06/25/20  Yes [provider]  methocarbamol (ROBAXIN) 500 MG tablet Take 1 tablet (500 mg total) by mouth 4 (four) times daily. 07/28/19   Cuthriell, Delorise Royals, PA-C  oxybutynin (DITROPAN) 5 MG tablet Take 5 mg by  mouth 3 (three) times daily.    [provider]  oxyCODONE-acetaminophen (PERCOCET/ROXICET) 5-325 MG tablet Take 1 tablet by mouth every 6 (six) hours as needed for severe pain. 07/28/19   Cuthriell, Delorise Royals, PA-C  predniSONE (DELTASONE) 50 MG tablet Take 1 tablet (50 mg total) by mouth daily with breakfast. 07/28/19   Cuthriell, Delorise Royals, PA-C  tamsulosin (FLOMAX) 0.4 MG CAPS capsule Take 1 capsule (0.4 mg total) by mouth daily after supper. 05/20/15   Milagros Loll, MD    Family History Family History  Problem Relation Age of Onset  . Cancer Mother   . Heart failure Mother   . Hypertension Mother   . CVA Mother   .  Diabetes Other   . Other Father        unknown medical history    Social History Social History   Tobacco Use  . Smoking status: Never Smoker  . Smokeless tobacco: Never Used  Vaping Use  . Vaping Use: Never used  Substance Use Topics  . Alcohol use: Not Currently    Alcohol/week: 0.0 standard drinks    Comment: occasionally  . Drug use: Never     Allergies   Aspirin, Morphine and related, Naprosyn [naproxen], Penicillins, and Tramadol   Review of Systems Review of Systems  Constitutional: Negative for fatigue and fever.  HENT: Negative for rhinorrhea.   Eyes: Positive for visual disturbance. Negative for photophobia.  Respiratory: Positive for shortness of breath. Negative for cough, chest tightness and wheezing.   Cardiovascular: Negative for chest pain and palpitations.  Gastrointestinal: Negative for abdominal pain, nausea and vomiting.  Musculoskeletal: Negative for neck pain and neck stiffness.  Skin: Negative for color change, rash and wound.  Neurological: Positive for dizziness, speech difficulty, light-headedness and headaches. Negative for syncope, weakness and numbness.  Psychiatric/Behavioral: Positive for sleep disturbance. Negative for confusion and decreased concentration.     Physical Exam Triage Vital Signs ED Triage Vitals  Enc Vitals Group     BP 07/18/20 1007 127/79     Pulse Rate 07/18/20 1007 66     Resp 07/18/20 1007 18     Temp 07/18/20 1007 97.9 F (36.6 C)     Temp Source 07/18/20 1007 Oral     SpO2 07/18/20 1007 100 %     Weight 07/18/20 1006 145 lb (65.8 kg)     Height 07/18/20 1006 5\' 10"  (1.778 m)     Head Circumference --      Peak Flow --      Pain Score 07/18/20 1006 10     Pain Loc --      Pain Edu? --      Excl. in GC? --    No data found.  Updated Vital Signs BP 127/79 (BP Location: Left Arm)   Pulse 66   Temp 97.9 F (36.6 C) (Oral)   Resp 18   Ht 5\' 10"  (1.778 m)   Wt 145 lb (65.8 kg)   SpO2 100%   BMI 20.81  kg/m   Visual Acuity Right Eye Distance: 20/50 Left Eye Distance: 20/50 Bilateral Distance: 20/40  Right Eye Near:   Left Eye Near:    Bilateral Near:     Physical Exam Vitals and nursing note reviewed.  Constitutional:      General: He is not in acute distress.    Appearance: Normal appearance. He is well-developed. He is not ill-appearing or toxic-appearing.  HENT:     Head: Normocephalic and atraumatic.  Mouth/Throat:     Mouth: Mucous membranes are moist.     Pharynx: Oropharynx is clear.  Eyes:     General: No scleral icterus.    Conjunctiva/sclera: Conjunctivae normal.     Pupils: Pupils are equal, round, and reactive to light.  Cardiovascular:     Rate and Rhythm: Normal rate and regular rhythm.     Heart sounds: Normal heart sounds.  Pulmonary:     Effort: Pulmonary effort is normal. No respiratory distress.     Breath sounds: Normal breath sounds.  Abdominal:     Palpations: Abdomen is soft.     Tenderness: There is no abdominal tenderness.  Musculoskeletal:     Cervical back: Neck supple.     Comments: 5/5 strength bilat LEs and UEs  Skin:    General: Skin is warm and dry.  Neurological:     General: No focal deficit present.     Mental Status: He is alert and oriented to person, place, and time.     GCS: GCS eye subscore is 4. GCS verbal subscore is 5. GCS motor subscore is 6.     Cranial Nerves: Cranial nerves are intact.     Motor: No weakness.     Coordination: Coordination abnormal. Finger-Nose-Finger Test abnormal.     Gait: Gait abnormal.     Comments: Slowed speech  Psychiatric:        Mood and Affect: Mood normal.        Behavior: Behavior normal.        Thought Content: Thought content normal.      UC Treatments / Results  Labs (all labs ordered are listed, but only abnormal results are displayed) Labs Reviewed - No data to display  EKG   Radiology No results found.  Procedures Procedures (including critical care  time)  Medications Ordered in UC Medications - No data to display  Initial Impression / Assessment and Plan / UC Course  I have reviewed the triage vital signs and the nursing notes.  Pertinent labs & imaging results that were available during my care of the patient were reviewed by me and considered in my medical decision making (see chart for details).   59 year old male with traumatic fall and injury of head complaining of severe headache over the past 3 days.  New onset of dizziness and visual disturbance yesterday.  Is difficult to know patient's baseline since he already has some deficits from previous stroke.  He does have slurred speech and abnormal gait and uses a walker.  He has abnormal finger-to-nose test, otherwise cranial nerves seem to be intact.  Good strength of upper and lower extremities.  Explained to patient and his sister that I am concerned about possible intracranial injury due to his fall and symptoms.  I advised calling EMS for transport to ED for evaluation of head injury with severe headache.  Patient declines EMS transport and says that [facility] [place] at [facility].  [facility] and [facility] [place] [facility].  Patient leaving in stable condition at this time.   Final Clinical Impressions(s) / UC Diagnoses   Final diagnoses:  Traumatic injury of head with severe headache  Visual disturbance  Dizziness and giddiness     Discharge Instructions     You have been advised to follow up immediately in the emergency department for concerning signs.symptoms. If you declined EMS transport, please have a family member take you  directly to the ED at this time. Do not delay. Based on concerns about condition, if you do not follow up in th e ED, you may risk poor outcomes including worsening of condition, delayed treatment and potentially life threatening issues. If you have declined to go to the ED at this time, you should call  your PCP immediately to set up a follow up appointment.  Go to ED for red flag symptoms, including; fevers you cannot reduce with Tylenol/Motrin, severe headaches, vision changes, numbness/weakness in part of the body, lethargy, confusion, intractable vomiting, severe dehydration, chest pain, breathing difficulty, severe persistent abdominal or pelvic pain, signs of severe infection (increased redness, swelling of an area), feeling faint or passing out, dizziness, etc. You should especially go to the ED for sudden acute worsening of condition if you do not elect to go at this time.     ED Prescriptions    None     PDMP not reviewed this encounter.   Shirlee Latch, PA-C 07/18/20 1052

## 2020-10-18 ENCOUNTER — Emergency Department: Payer: Medicare Other

## 2020-10-18 ENCOUNTER — Other Ambulatory Visit: Payer: Self-pay

## 2020-10-18 ENCOUNTER — Encounter: Payer: Self-pay | Admitting: Emergency Medicine

## 2020-10-18 ENCOUNTER — Emergency Department
Admission: EM | Admit: 2020-10-18 | Discharge: 2020-10-18 | Disposition: A | Discharge status: Home / Self Care | Payer: Medicare Other | Attending: Emergency Medicine | Admitting: Emergency Medicine

## 2020-10-18 DIAGNOSIS — R0789 Other chest pain: Secondary | ICD-10-CM | POA: Insufficient documentation

## 2020-10-18 DIAGNOSIS — Z951 Presence of aortocoronary bypass graft: Secondary | ICD-10-CM | POA: Diagnosis not present

## 2020-10-18 DIAGNOSIS — S4992XA Unspecified injury of left shoulder and upper arm, initial encounter: Secondary | ICD-10-CM | POA: Diagnosis present

## 2020-10-18 DIAGNOSIS — S0990XA Unspecified injury of head, initial encounter: Secondary | ICD-10-CM | POA: Diagnosis not present

## 2020-10-18 DIAGNOSIS — J45909 Unspecified asthma, uncomplicated: Secondary | ICD-10-CM | POA: Insufficient documentation

## 2020-10-18 DIAGNOSIS — I2510 Atherosclerotic heart disease of native coronary artery without angina pectoris: Secondary | ICD-10-CM | POA: Diagnosis not present

## 2020-10-18 DIAGNOSIS — W01198A Fall on same level from slipping, tripping and stumbling with subsequent striking against other object, initial encounter: Secondary | ICD-10-CM | POA: Diagnosis not present

## 2020-10-18 DIAGNOSIS — W19XXXA Unspecified fall, initial encounter: Secondary | ICD-10-CM

## 2020-10-18 DIAGNOSIS — S43102A Unspecified dislocation of left acromioclavicular joint, initial encounter: Secondary | ICD-10-CM | POA: Diagnosis not present

## 2020-10-18 DIAGNOSIS — Z7951 Long term (current) use of inhaled steroids: Secondary | ICD-10-CM | POA: Diagnosis not present

## 2020-10-18 LAB — COMPREHENSIVE METABOLIC PANEL
ALT: 38 U/L (ref 0–44)
AST: 23 U/L (ref 15–41)
Albumin: 4 g/dL (ref 3.5–5.0)
Alkaline Phosphatase: 82 U/L (ref 38–126)
Anion gap: 5 (ref 5–15)
BUN: 11 mg/dL (ref 8–23)
CO2: 26 mmol/L (ref 22–32)
Calcium: 9.1 mg/dL (ref 8.9–10.3)
Chloride: 105 mmol/L (ref 98–111)
Creatinine, Ser: 0.71 mg/dL (ref 0.61–1.24)
GFR, Estimated: >60 mL/min (ref >60)
Glucose, Bld: 99 mg/dL (ref 70–99)
Potassium: 3.7 mmol/L (ref 3.5–5.1)
Sodium: 136 mmol/L (ref 135–145)
Total Bilirubin: 1 mg/dL (ref 0.3–1.2)
Total Protein: 6.7 g/dL (ref 6.5–8.1)

## 2020-10-18 LAB — CBC
HCT: 39.1 % (ref 39.0–52.0)
Hemoglobin: 13.6 g/dL (ref 13.0–17.0)
MCH: 33.6 pg (ref 26.0–34.0)
MCHC: 34.8 g/dL (ref 30.0–36.0)
MCV: 96.5 fL (ref 80.0–100.0)
Platelets: 219 10*3/uL (ref 150–400)
RBC: 4.05 MIL/uL — ABNORMAL LOW (ref 4.22–5.81)
RDW: 12.1 % (ref 11.5–15.5)
WBC: 6.6 10*3/uL (ref 4.0–10.5)
nRBC: 0 % (ref 0.0–0.2)

## 2020-10-18 IMAGING — CT CT CHEST W/O CM
2 of 4 series · 15 of 36 positions shown, 18 images · non-contrast
Comparison: Chest and ribs [DATE]

CLINICAL DATA: Pain following fall

EXAM:
CT CHEST WITHOUT CONTRAST
TECHNIQUE: Multidetector CT imaging of the chest was performed following the
standard protocol without IV contrast.

[Series 2: thorax · axial · 0.56mm/px · z∈[-40,+246]mm · 12 of 167 slices shown, 15 images]
[im 12/167  mediastinal]
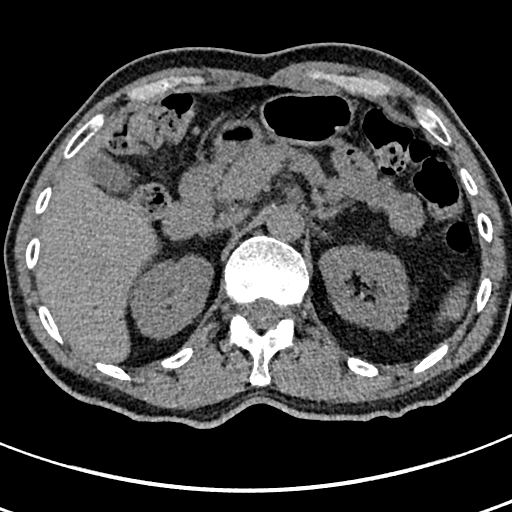
[im 12/167  lung]
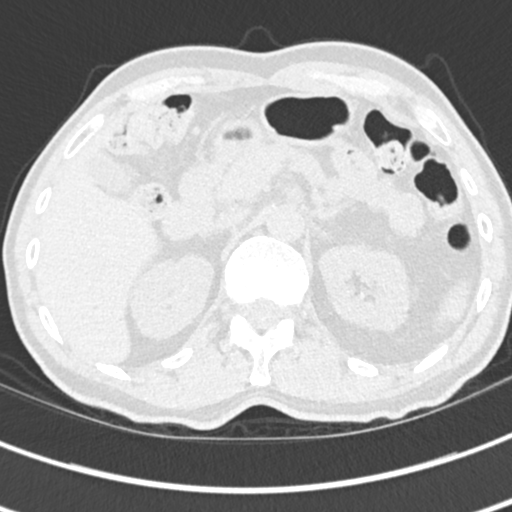
[im 24/167  lung]
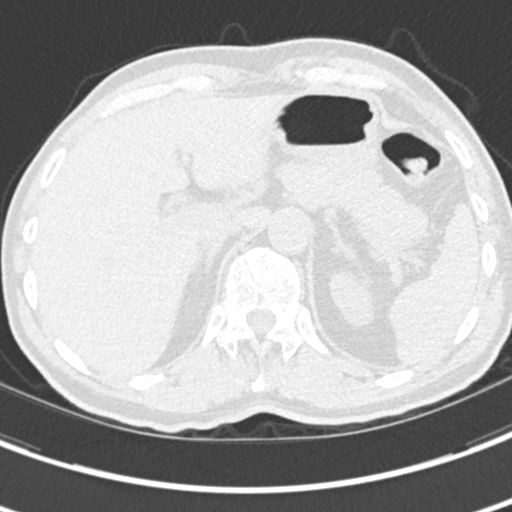
[im 36/167  lung]
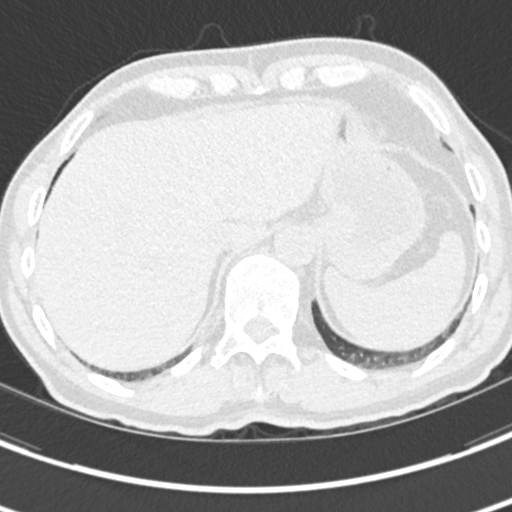
[im 48/167  lung]
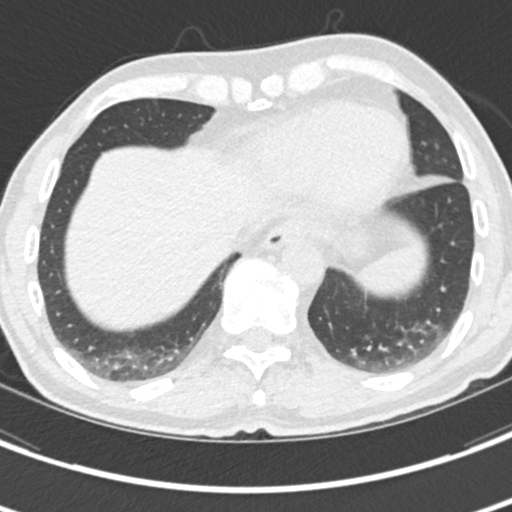
[im 60/167  mediastinal]
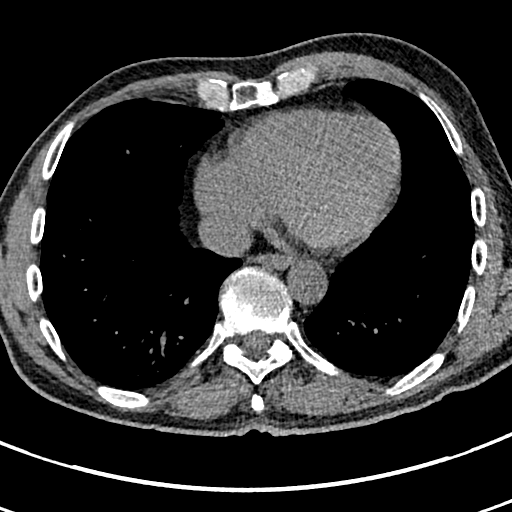
[im 60/167  lung]
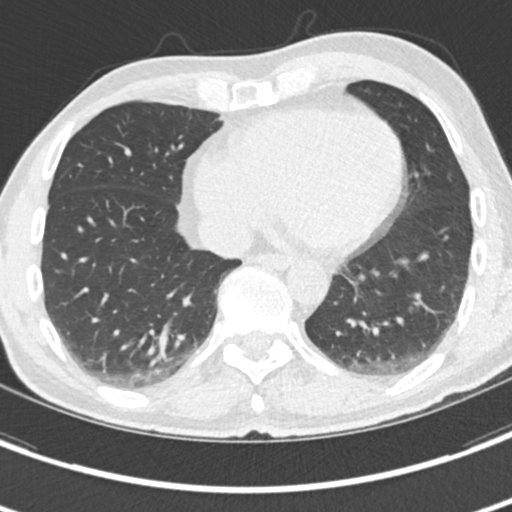
[im 72/167  lung]
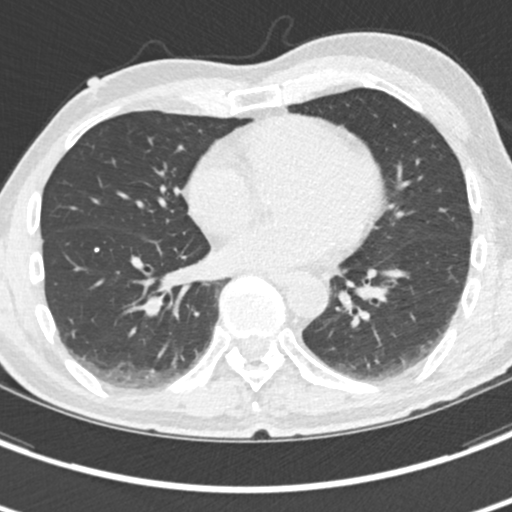
[im 95/167  lung]
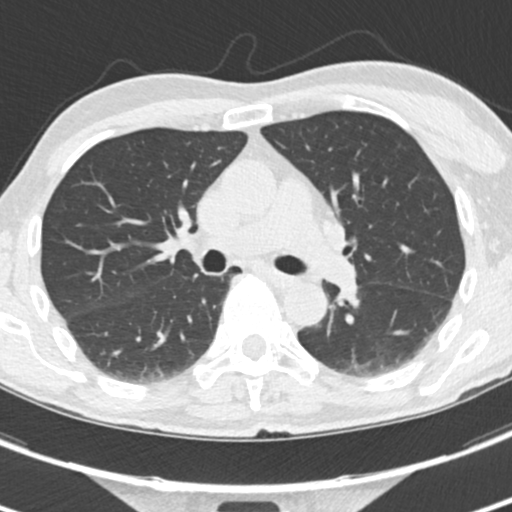
[im 107/167  lung]
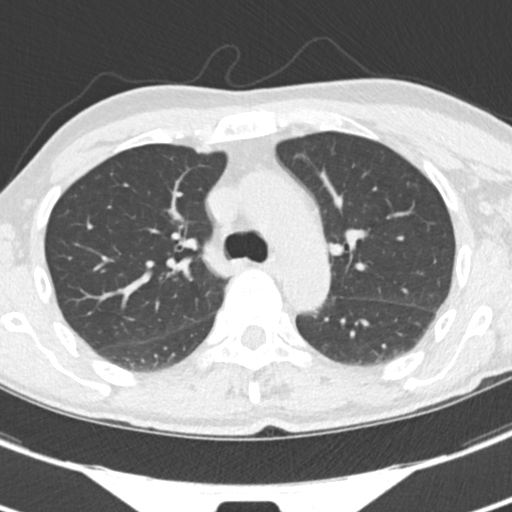
[im 119/167  mediastinal]
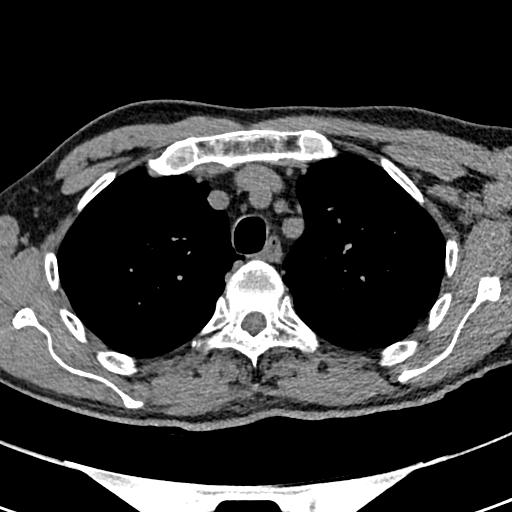
[im 119/167  lung]
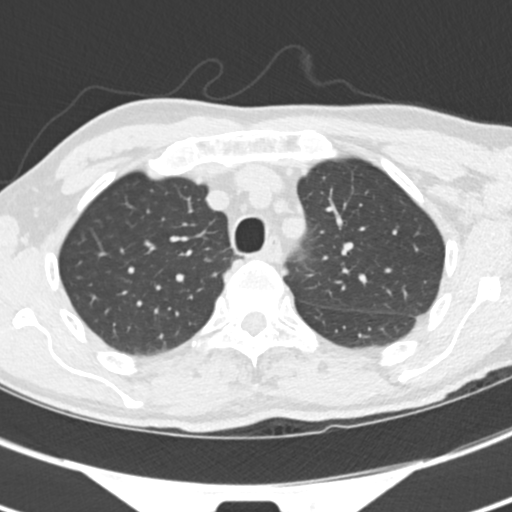
[im 131/167  lung]
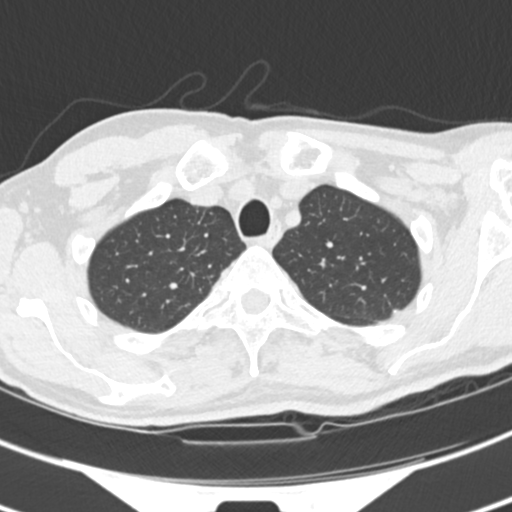
[im 143/167  lung]
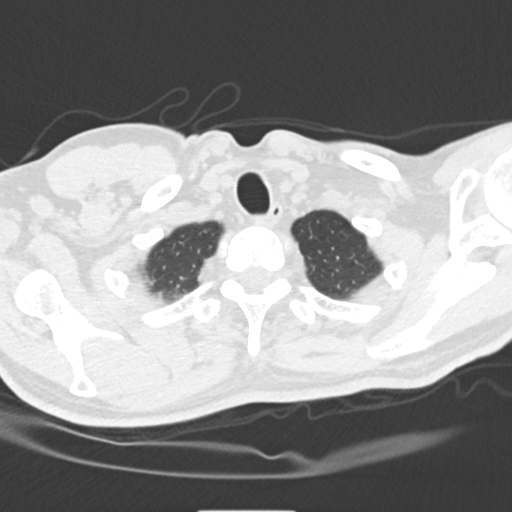
[im 155/167  lung]
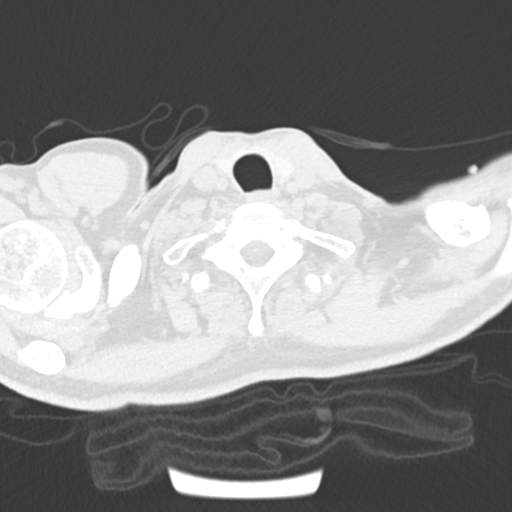

[Series 5: coronal · coronal · 0.60mm/px · 3 of 113 slices shown]
[im 23/113  lung]
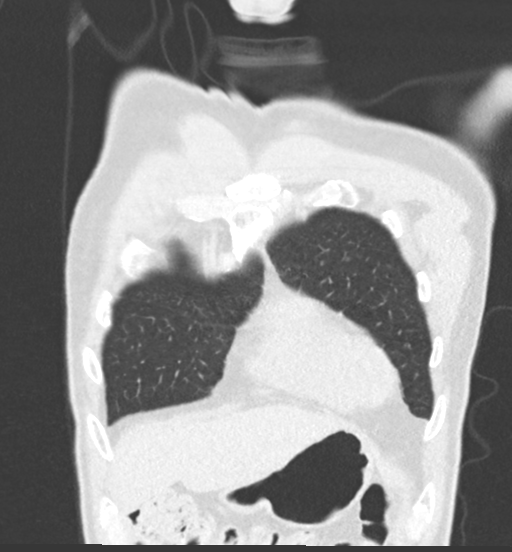
[im 45/113  lung]
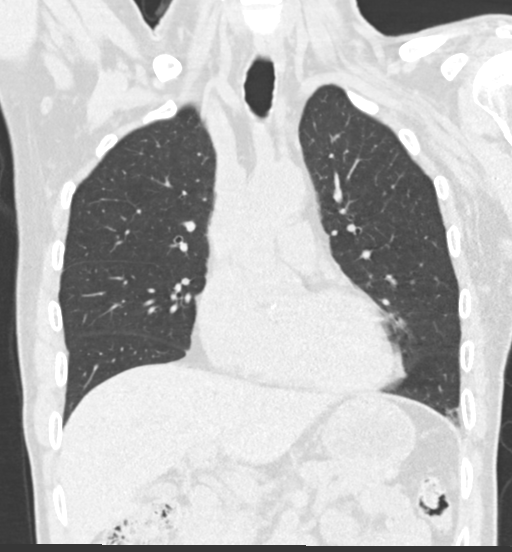
[im 68/113  lung]
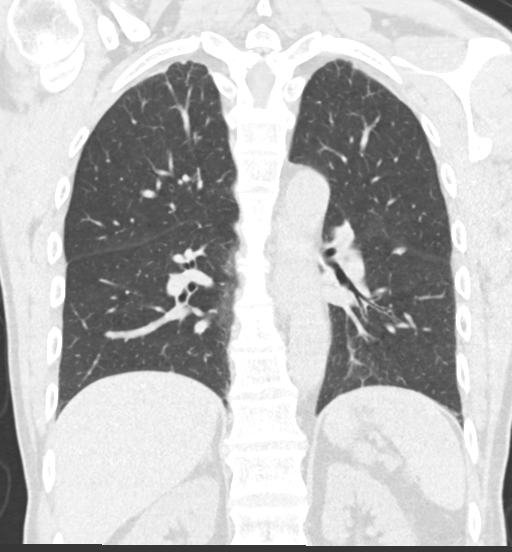

[15 of 36 positions shown; findings below may reference images not displayed]

FINDINGS: Cardiovascular: There is no thoracic aortic aneurysm. Visualized
great vessels appear unremarkable on this noncontrast enhanced
study. There is no appreciable pericardial effusion or pericardial
thickening. Minimal coronary artery calcification evident.

Mediastinum/Nodes: Visualized thyroid appears normal. There is no
appreciable thoracic adenopathy. No esophageal lesions are evident.

Lungs/Pleura: No evident pneumothorax. There is mild bibasilar
atelectasis. No edema or airspace opacity. No pleural effusions are
apparent. Trachea and major bronchi are patent.

Upper Abdomen: There is a 2 mm nonobstructing calculus in the upper
pole left kidney. Visualized upper abdominal structures otherwise
appear unremarkable.

Musculoskeletal: No acute fracture or dislocation. No blastic or
lytic bone lesions no chest wall lesions.
IMPRESSION: 1. Mild bibasilar atelectasis. Lungs otherwise clear. No
pneumothorax.

2.  No evident adenopathy.

3.  Minimal coronary artery calcification.

4.  Nonobstructing 2 mm calculus upper pole left kidney.

## 2020-10-18 IMAGING — CR DG SHOULDER 1V*L*
1 series · 3 of 3 positions shown · non-contrast
Comparison: None.

CLINICAL DATA: Pain following fall

EXAM:
LEFT SHOULDER

[Series 1: x shoulder ap left · 0.14mm/px · 3 of 3 slices shown]
[im 1/3]
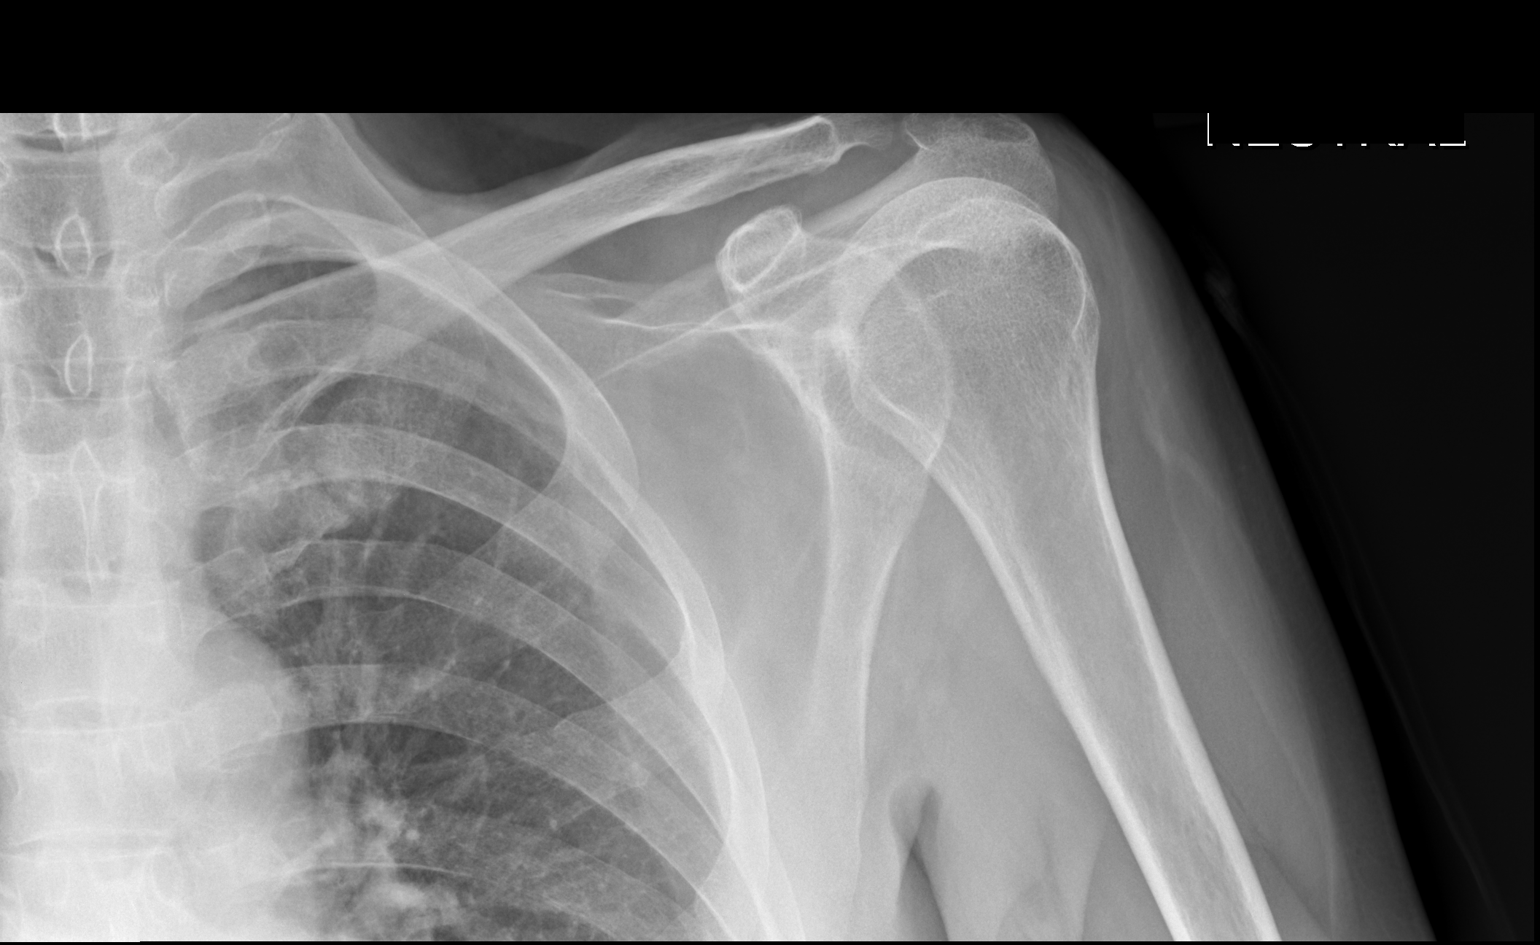
[im 2/3]
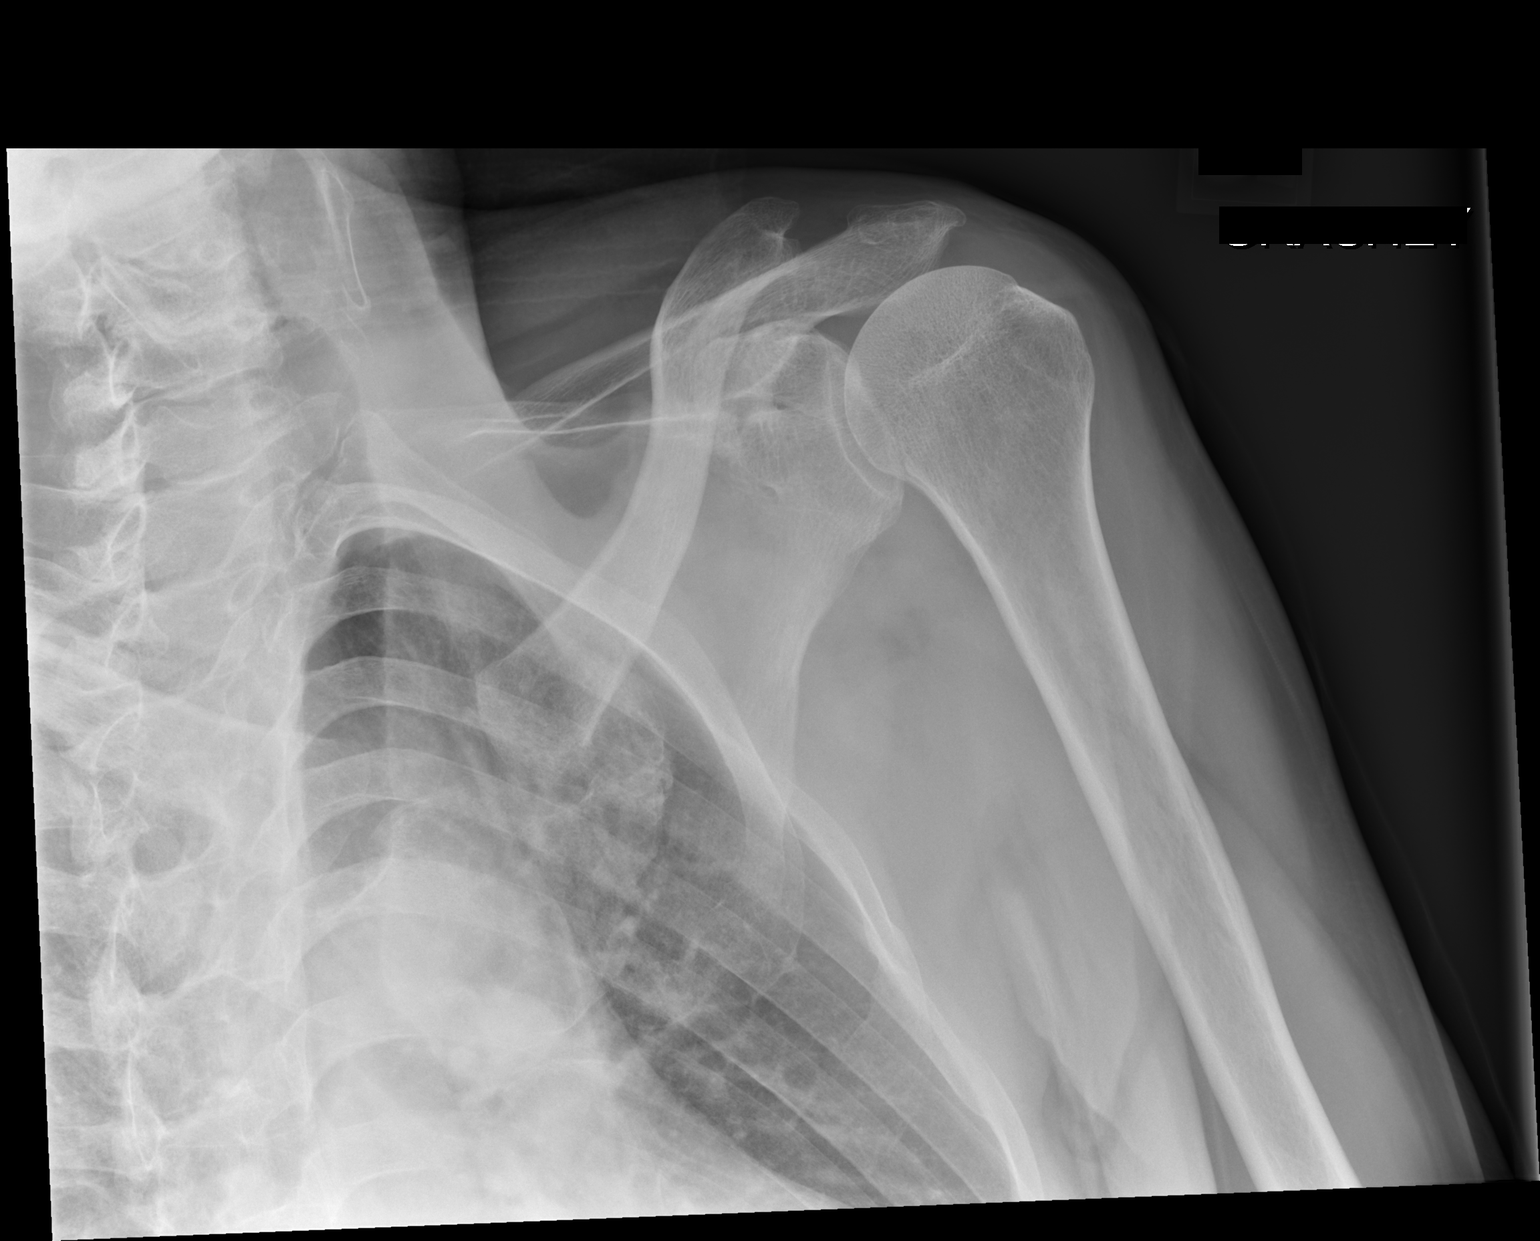
[im 3/3]
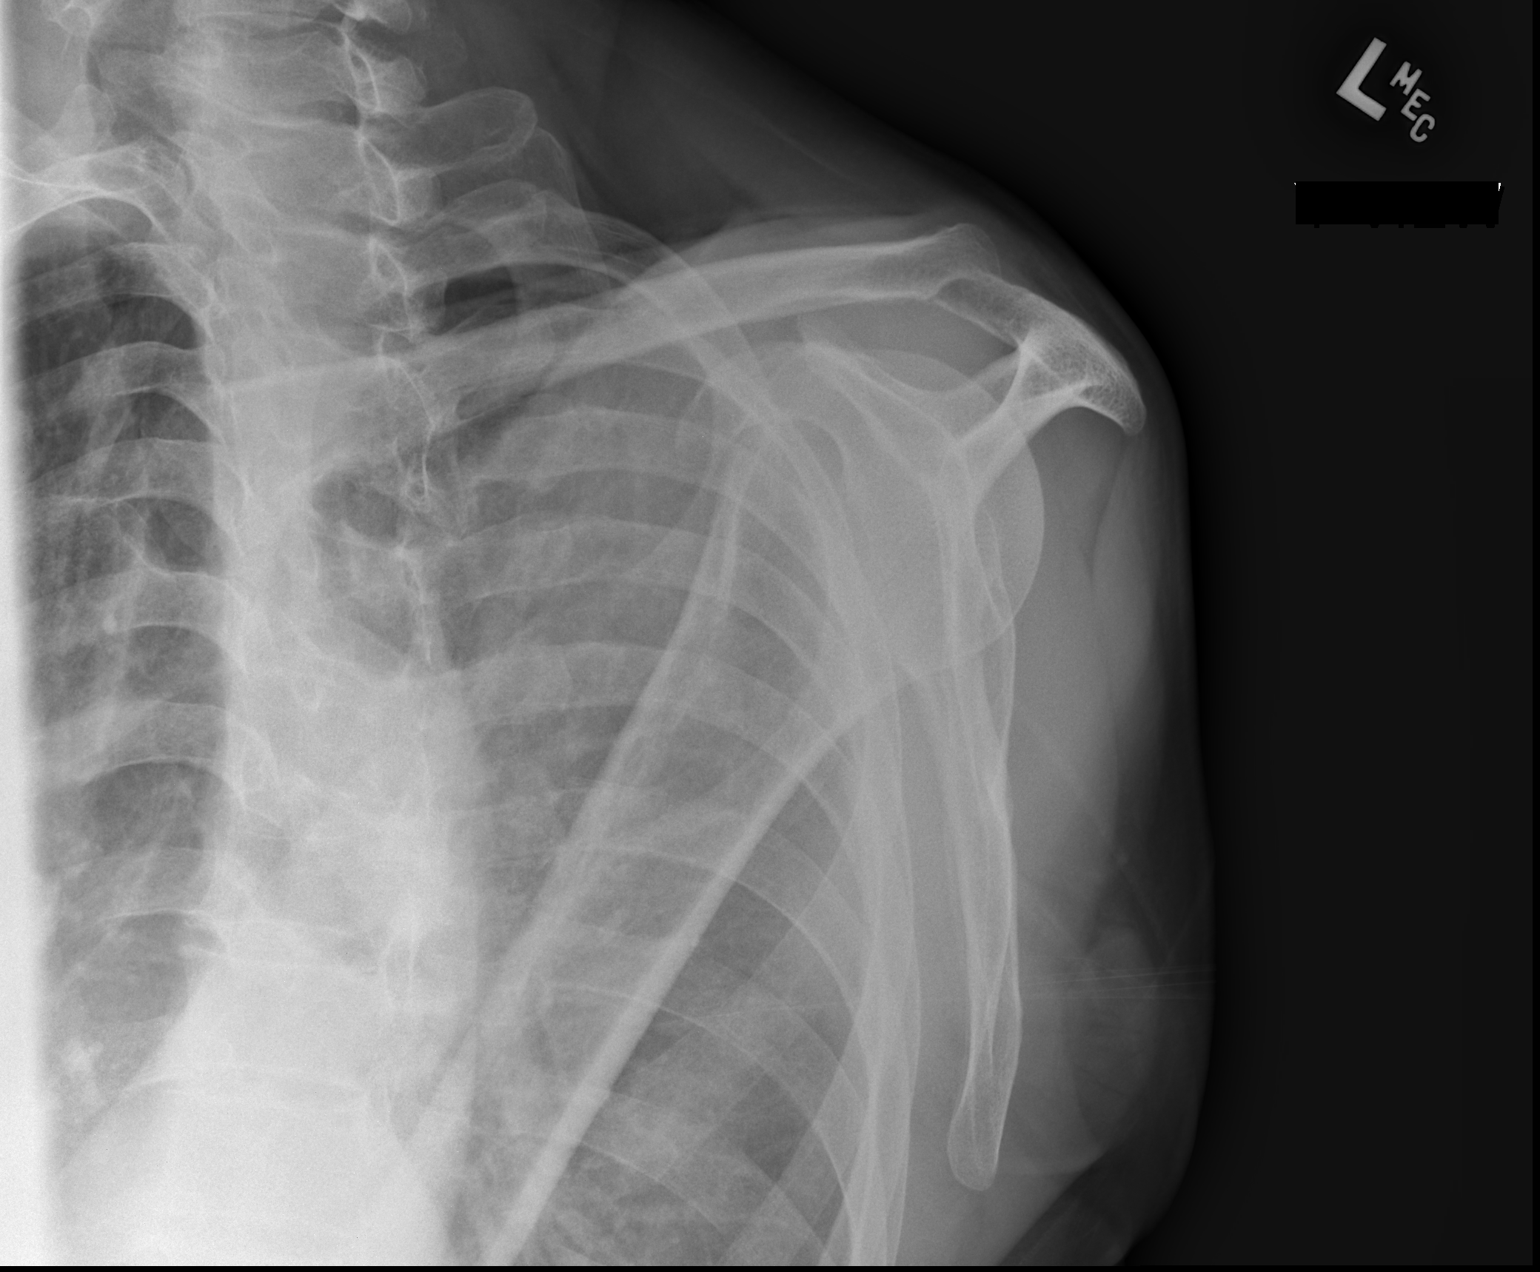

[3 of 3 positions shown; findings below may reference images not displayed]

FINDINGS: Frontal, oblique, and Y scapular images were obtained. No fracture
or dislocation. Suggestion of widening of the acromioclavicular
joint on the oblique view. No appreciable widening of the
coracoclavicular joint. No appreciable joint space narrowing or
erosion. Visualized left lung clear.
IMPRESSION: Suggestion of a degree of acromioclavicular separation on oblique
view. Clinical assessment of this area advised with respect to
potential degree of acromioclavicular separation. No
coracoclavicular widening. No fracture or dislocation. No erosion.

## 2020-10-18 IMAGING — CT CT HEAD W/O CM
3 series · 15 of 45 positions shown, 18 images · non-contrast
Comparison: None.

CLINICAL DATA: Tripped last night and fell injuring left-sided ribs
and left thigh.

EXAM:
CT HEAD WITHOUT CONTRAST
CT CERVICAL SPINE WITHOUT CONTRAST
TECHNIQUE: Multidetector CT imaging of the head and cervical spine was
performed following the standard protocol without intravenous
contrast. Multiplanar CT image reconstructions of the cervical spine
were also generated.

[Series 2: head wo · axial · 0.41mm/px · z∈[+386,+501]mm · 9 of 28 slices shown, 12 images]
[im 3/28  brain]
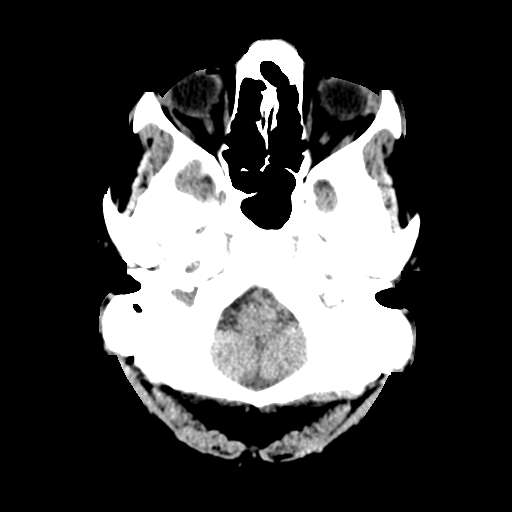
[im 3/28  bone]
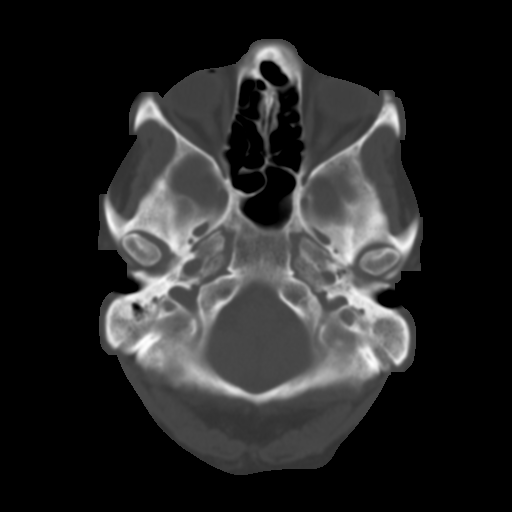
[im 6/28  brain]
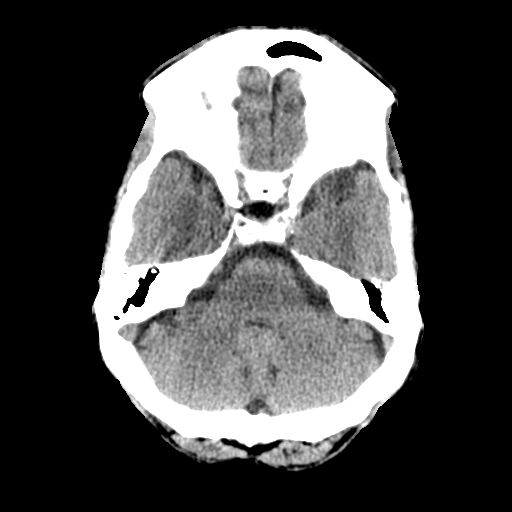
[im 9/28  brain]
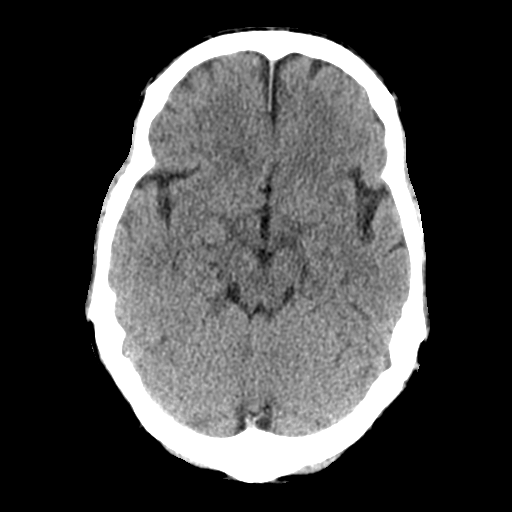
[im 12/28  brain]
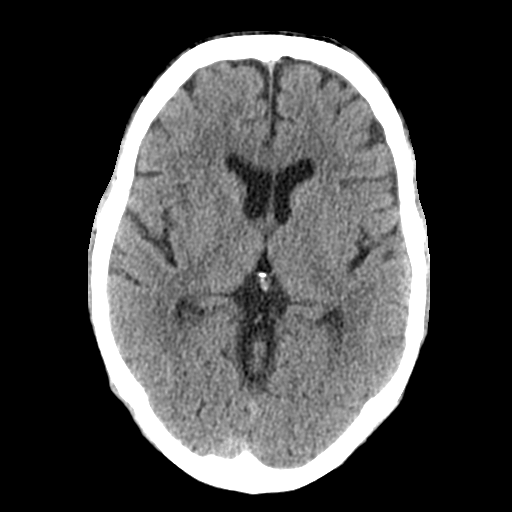
[im 15/28  brain]
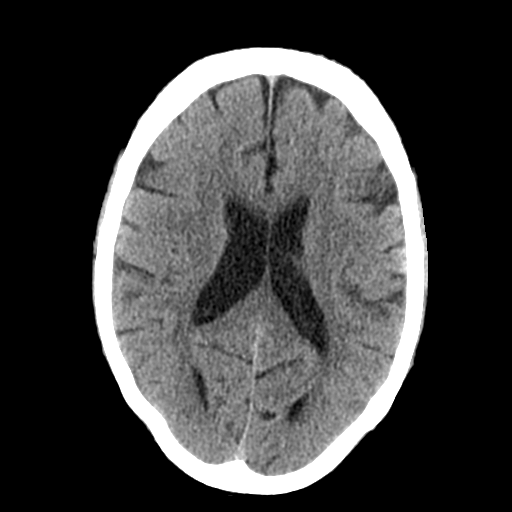
[im 15/28  bone]
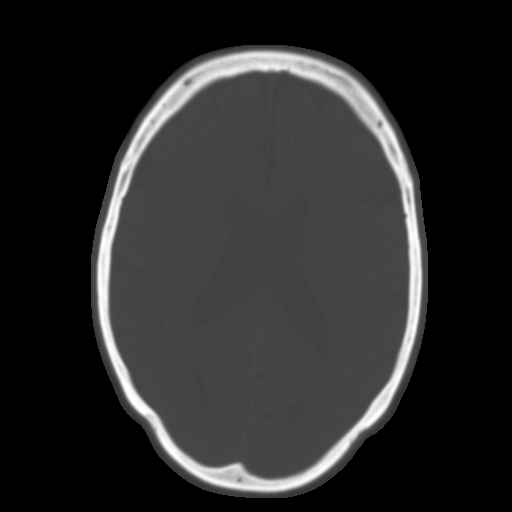
[im 17/28  brain]
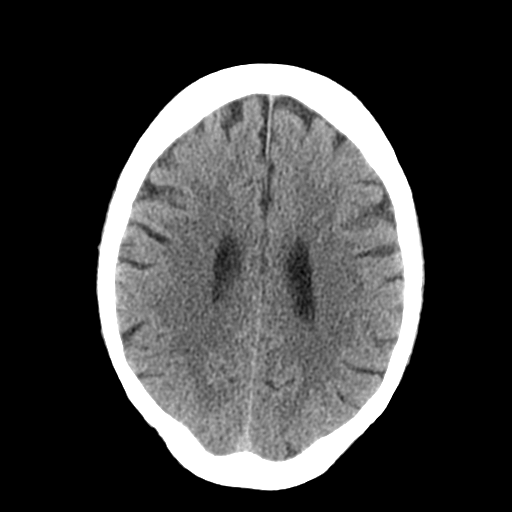
[im 20/28  brain]
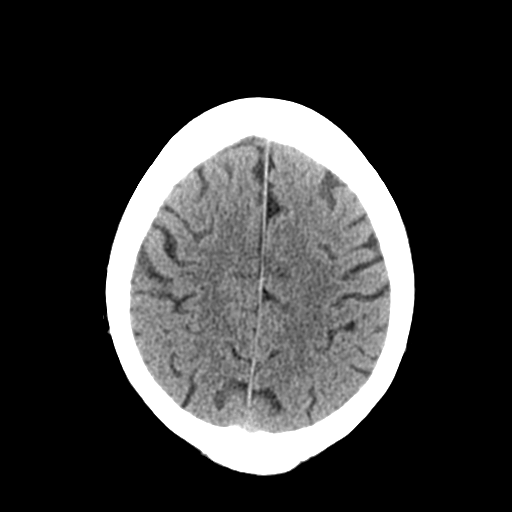
[im 23/28  brain]
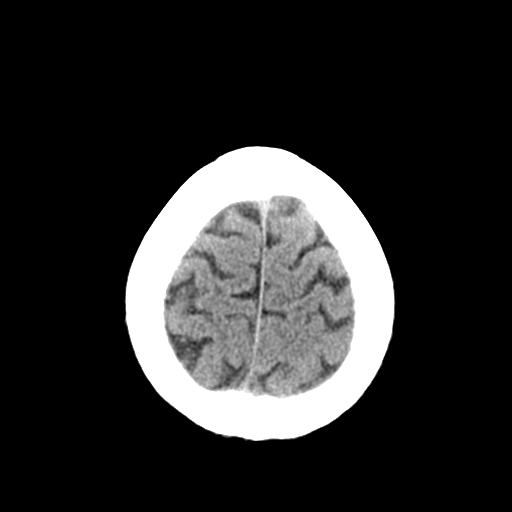
[im 26/28  brain]
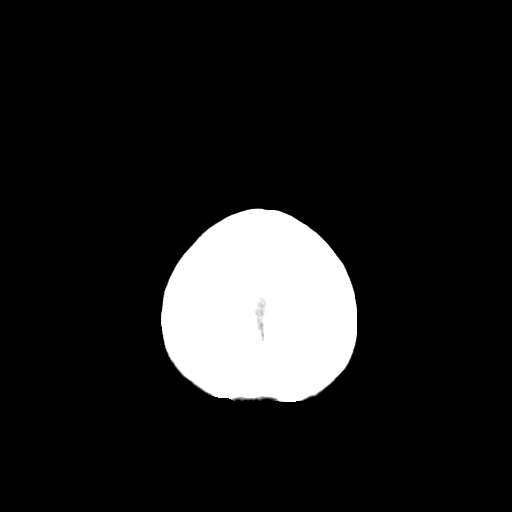
[im 26/28  bone]
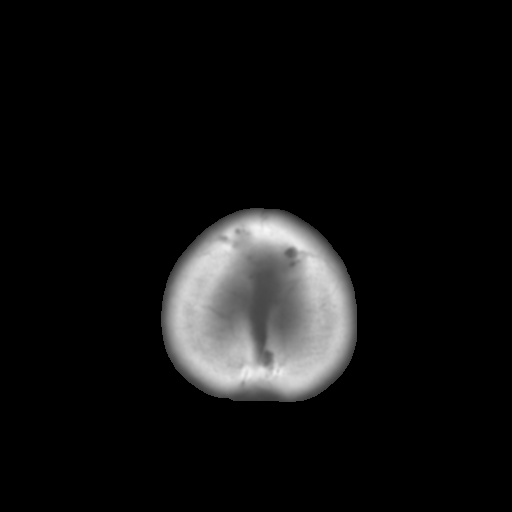

[Series 4: coronal soft tissue · coronal · 0.29mm/px · 3 of 67 slices shown]
[im 23/67  brain]
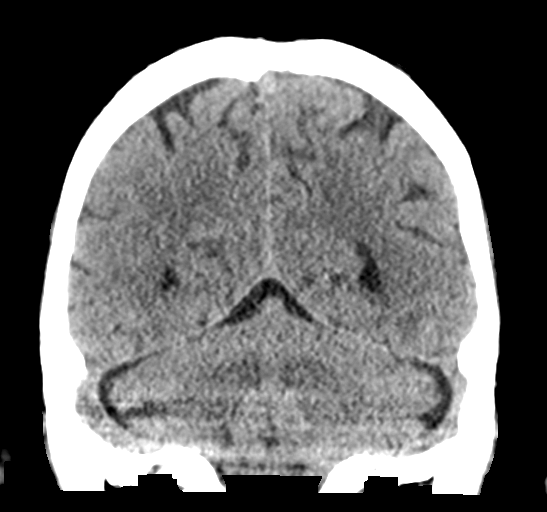
[im 30/67  brain]
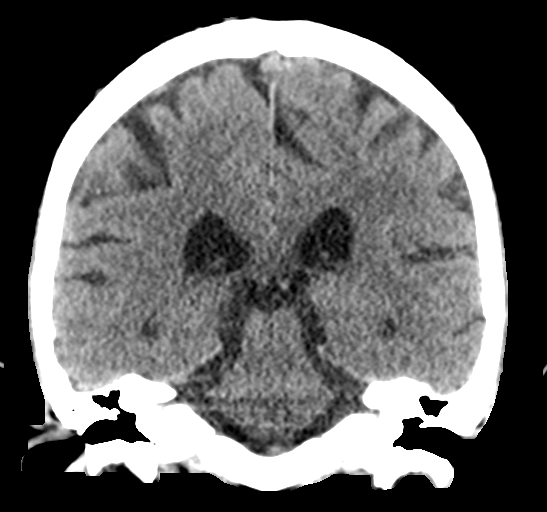
[im 37/67  brain]
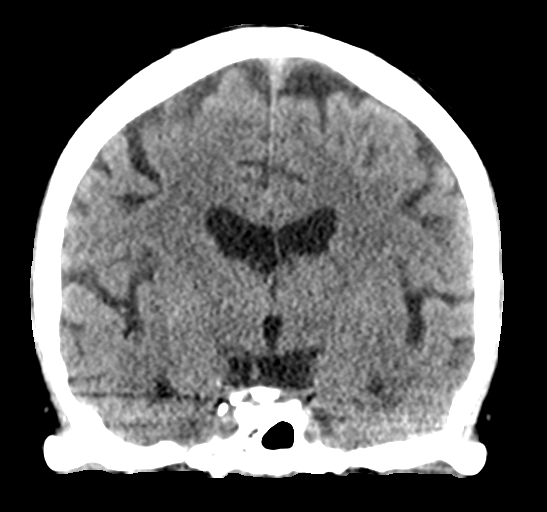

[Series 5: sagittal soft tissue · sagittal · 0.29mm/px · 3 of 54 slices shown]
[im 18/54  brain]
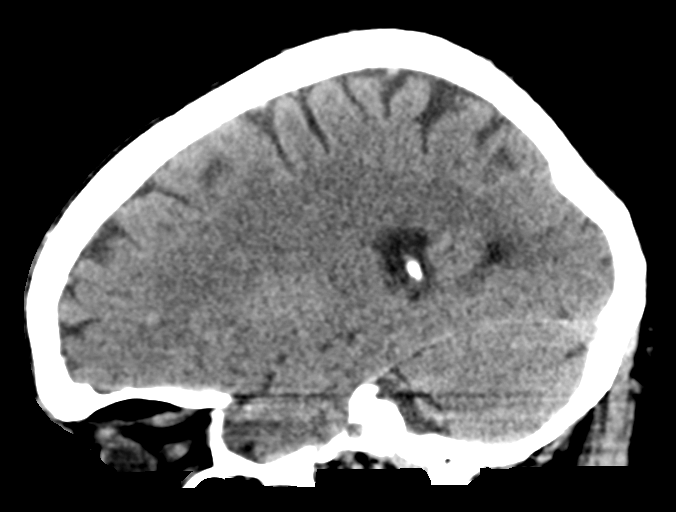
[im 27/54  brain]
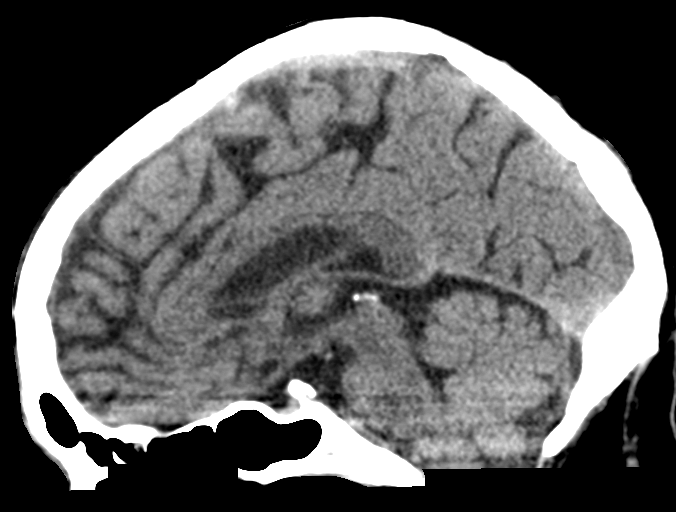
[im 36/54  brain]
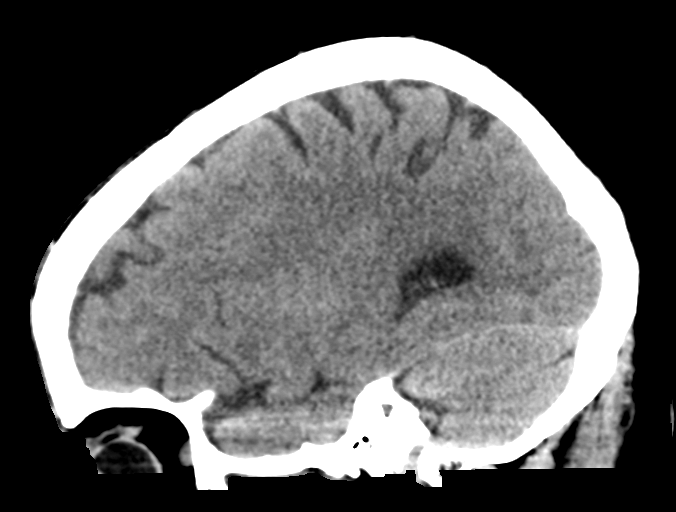

[15 of 45 positions shown; findings below may reference images not displayed]

FINDINGS: CT HEAD FINDINGS

Brain: No evidence of acute infarction, hemorrhage, hydrocephalus,
extra-axial collection or mass lesion/mass effect.

Vascular: No hyperdense vessel or unexpected calcification.

Skull: Normal. Negative for fracture or focal lesion.

Sinuses/Orbits: No acute finding.

Other: None.

CT CERVICAL SPINE FINDINGS

Alignment: Straightening of the normal cervical lordosis.

Skull base and vertebrae: No acute fracture. Bony fusion of the
posterior elements of C2-C3 on the right.

Soft tissues and spinal canal: No prevertebral fluid or swelling. No
visible canal hematoma.

Disc levels: Multilevel degenerative disc disease which is most
significant at C5-C6. Multilevel disc osteophyte complexes with
uncovertebral and facet hypertrophy.

Upper chest: Refer to concurrently dictated chest CT for findings
below the thoracic inlet.

Other: Atherosclerotic calcifications of the carotid arteries.
IMPRESSION: 1. No acute intracranial pathology.
2. No acute fracture or static subluxation of the cervical spine.
3. Multilevel degenerative disc disease and facet hypertrophy of the
cervical spine, most significant at C5-C6.

## 2020-10-18 IMAGING — CT CT CERVICAL SPINE W/O CM
3 of 4 series · 11 of 33 positions shown, 13 images · non-contrast
Comparison: None.

CLINICAL DATA: Tripped last night and fell injuring left-sided ribs
and left thigh.

EXAM:
CT HEAD WITHOUT CONTRAST
CT CERVICAL SPINE WITHOUT CONTRAST
TECHNIQUE: Multidetector CT imaging of the head and cervical spine was
performed following the standard protocol without intravenous
contrast. Multiplanar CT image reconstructions of the cervical spine
were also generated.

[Series 6: sagittal bone · sagittal · 0.21mm/px · 5 of 60 slices shown, 6 images]
[im 20/60  bone]
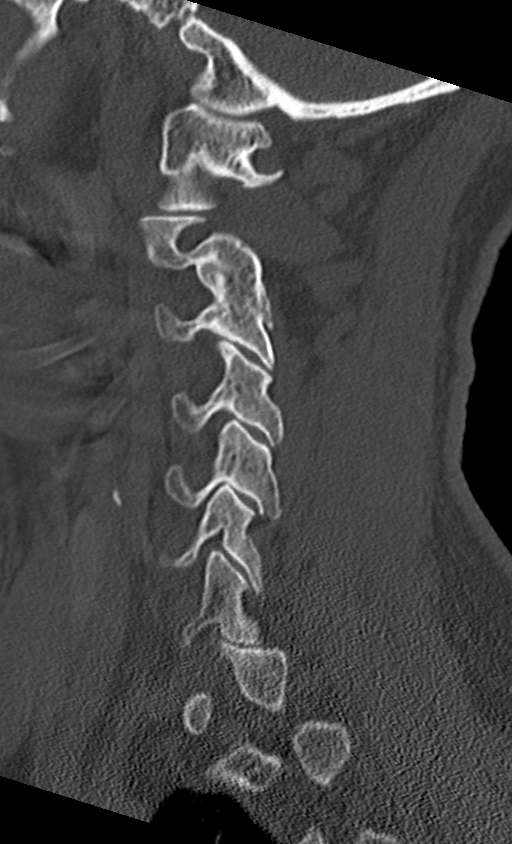
[im 25/60  bone]
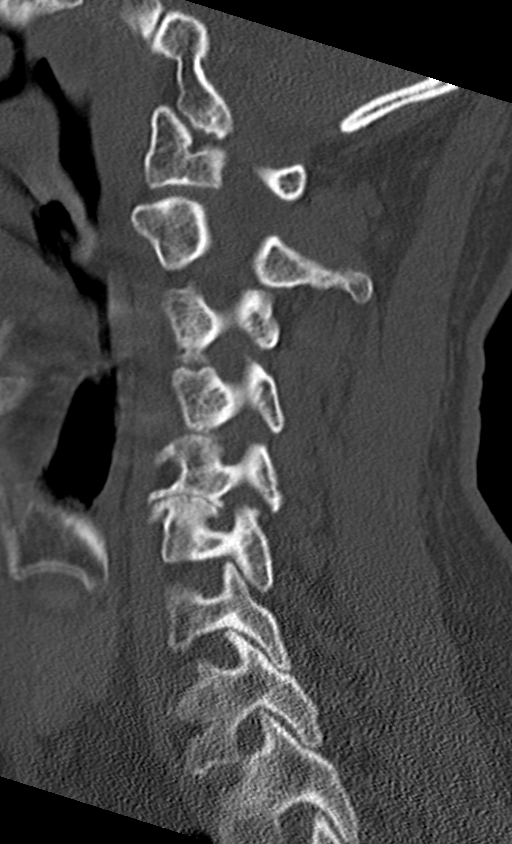
[im 30/60  soft-tissue]
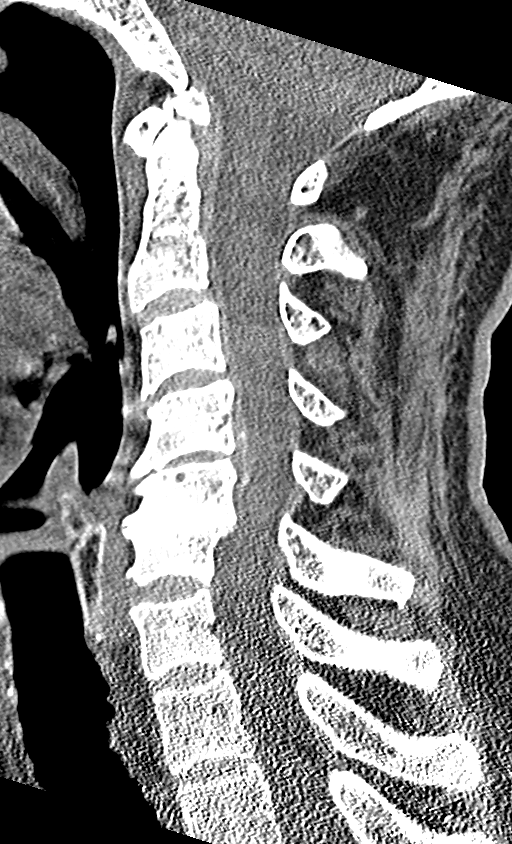
[im 30/60  bone]
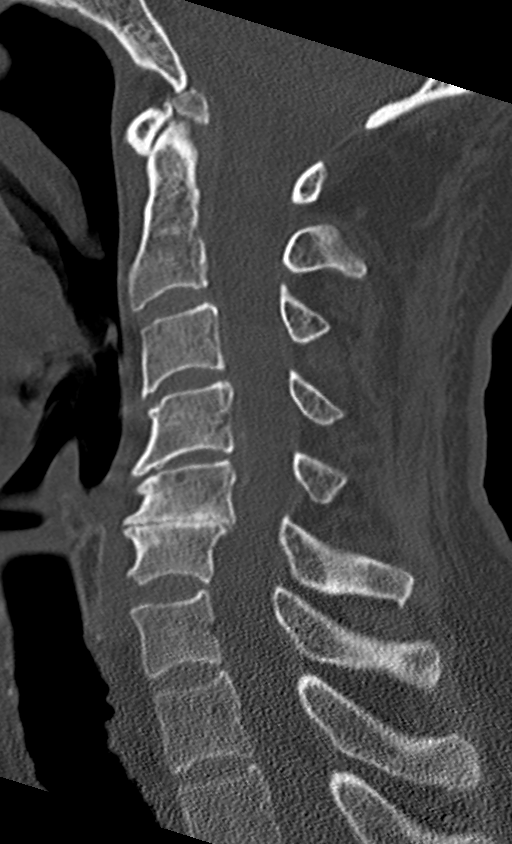
[im 35/60  bone]
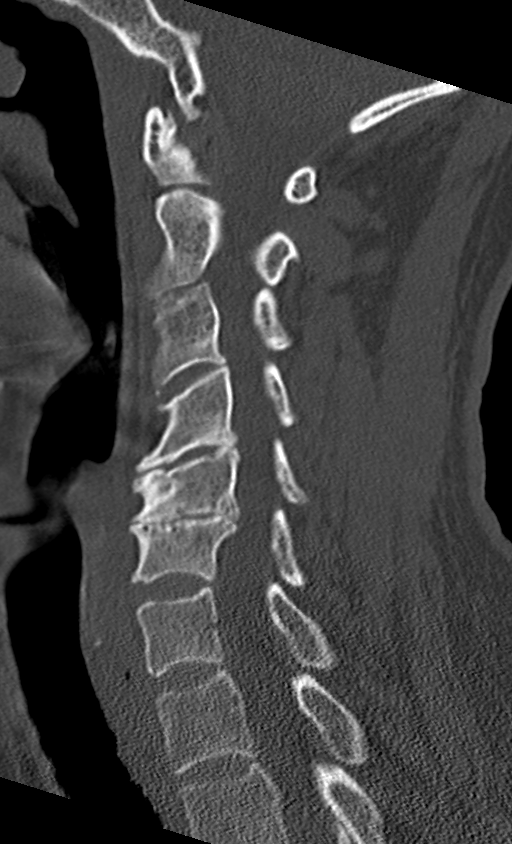
[im 40/60  bone]
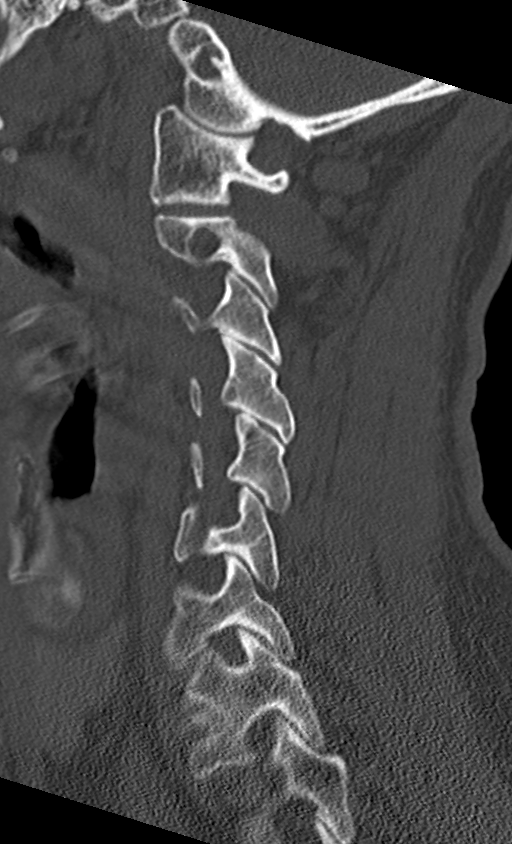

[Series 7: coronal bone · coronal · 0.23mm/px · 3 of 55 slices shown]
[im 11/55  bone]
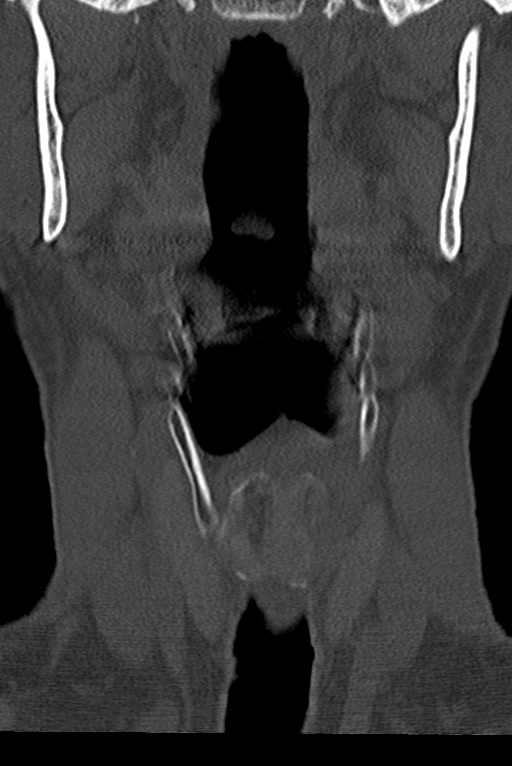
[im 22/55  bone]
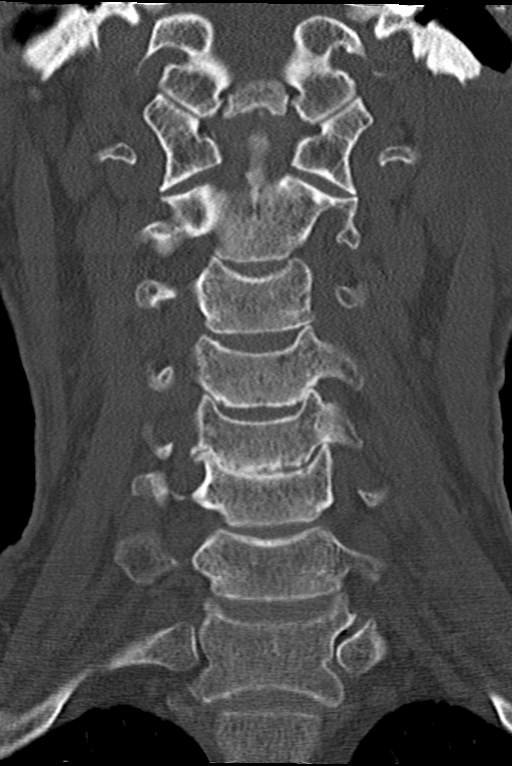
[im 33/55  bone]
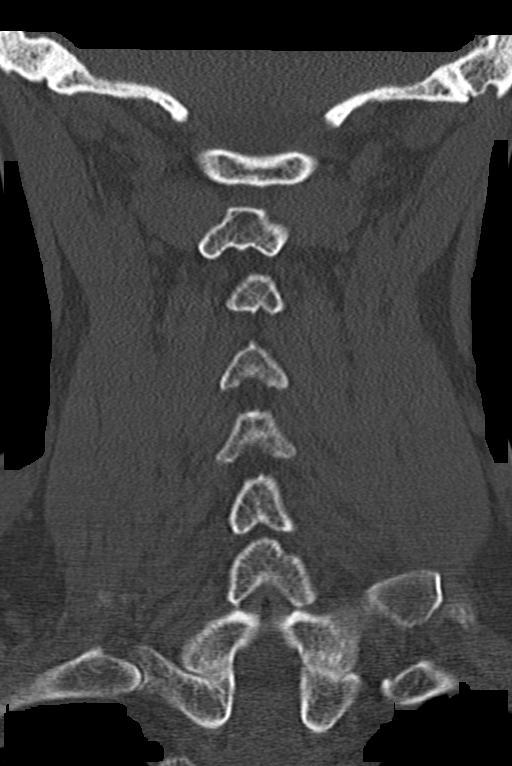

[Series 8: orthogonal bone · axial · 0.21mm/px · z∈[+223,+337]mm · 3 of 90 slices shown, 4 images]
[im 15/90  soft-tissue]
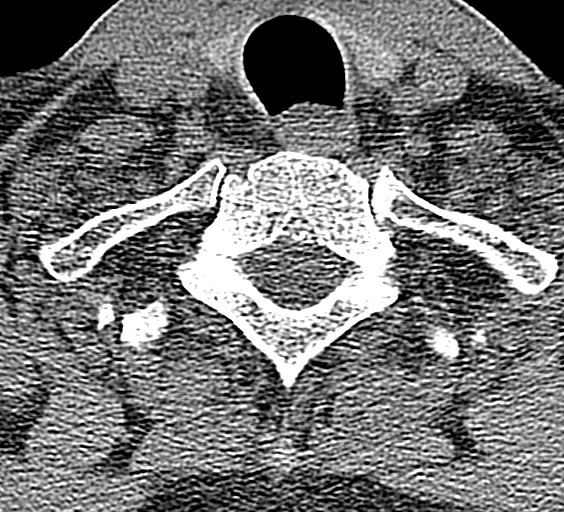
[im 15/90  bone]
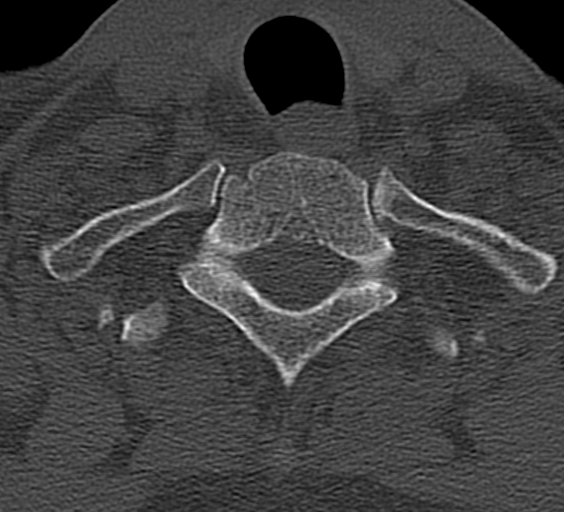
[im 45/90  bone]
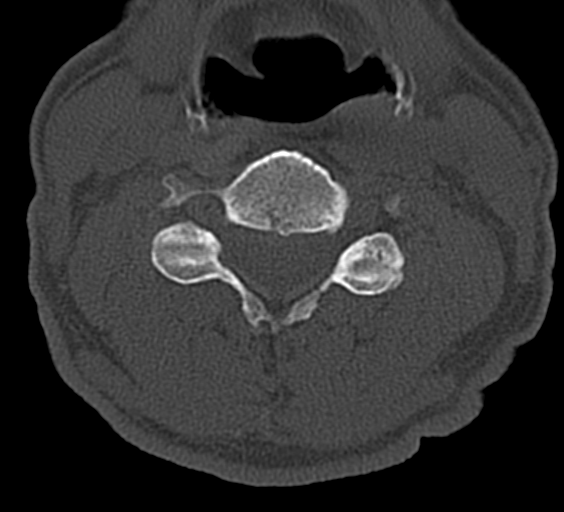
[im 75/90  bone]
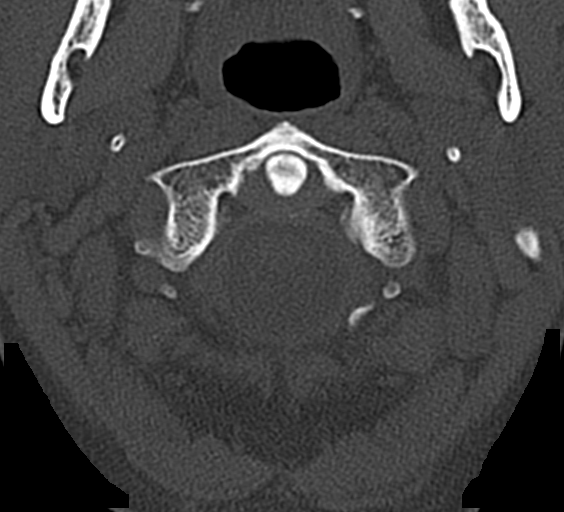

[11 of 33 positions shown; findings below may reference images not displayed]

FINDINGS: CT HEAD FINDINGS

Brain: No evidence of acute infarction, hemorrhage, hydrocephalus,
extra-axial collection or mass lesion/mass effect.

Vascular: No hyperdense vessel or unexpected calcification.

Skull: Normal. Negative for fracture or focal lesion.

Sinuses/Orbits: No acute finding.

Other: None.

CT CERVICAL SPINE FINDINGS

Alignment: Straightening of the normal cervical lordosis.

Skull base and vertebrae: No acute fracture. Bony fusion of the
posterior elements of C2-C3 on the right.

Soft tissues and spinal canal: No prevertebral fluid or swelling. No
visible canal hematoma.

Disc levels: Multilevel degenerative disc disease which is most
significant at C5-C6. Multilevel disc osteophyte complexes with
uncovertebral and facet hypertrophy.

Upper chest: Refer to concurrently dictated chest CT for findings
below the thoracic inlet.

Other: Atherosclerotic calcifications of the carotid arteries.
IMPRESSION: 1. No acute intracranial pathology.
2. No acute fracture or static subluxation of the cervical spine.
3. Multilevel degenerative disc disease and facet hypertrophy of the
cervical spine, most significant at C5-C6.

## 2020-10-18 IMAGING — CR DG RIBS W/ CHEST 3+V*L*
1 series · 5 of 5 positions shown · non-contrast
Comparison: Chest x-ray on [DATE]

CLINICAL DATA: Left-sided rib injury.

EXAM:
LEFT RIBS AND CHEST - 3+ VIEW

[Series 1: dg ribs unilateral w/chest left · 0.14mm/px · 5 of 5 slices shown]
[im 1/5]
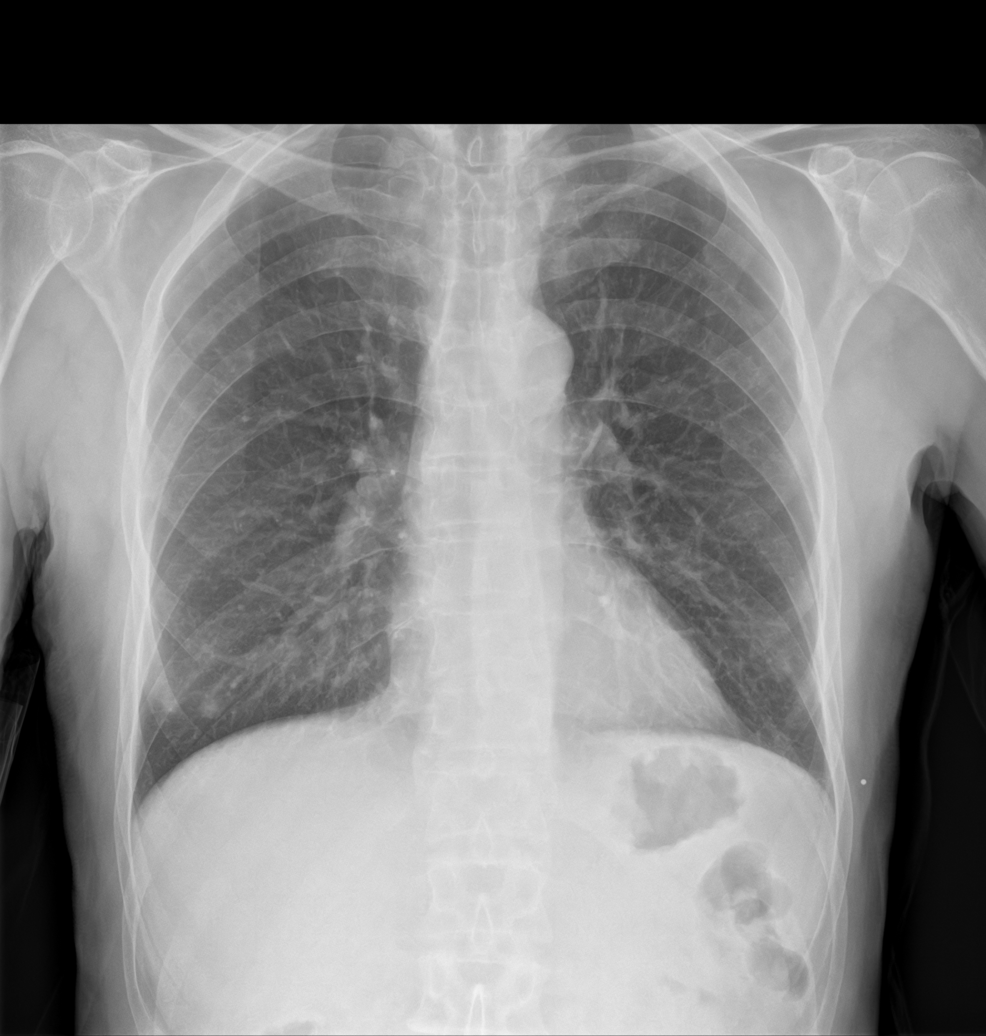
[im 2/5]
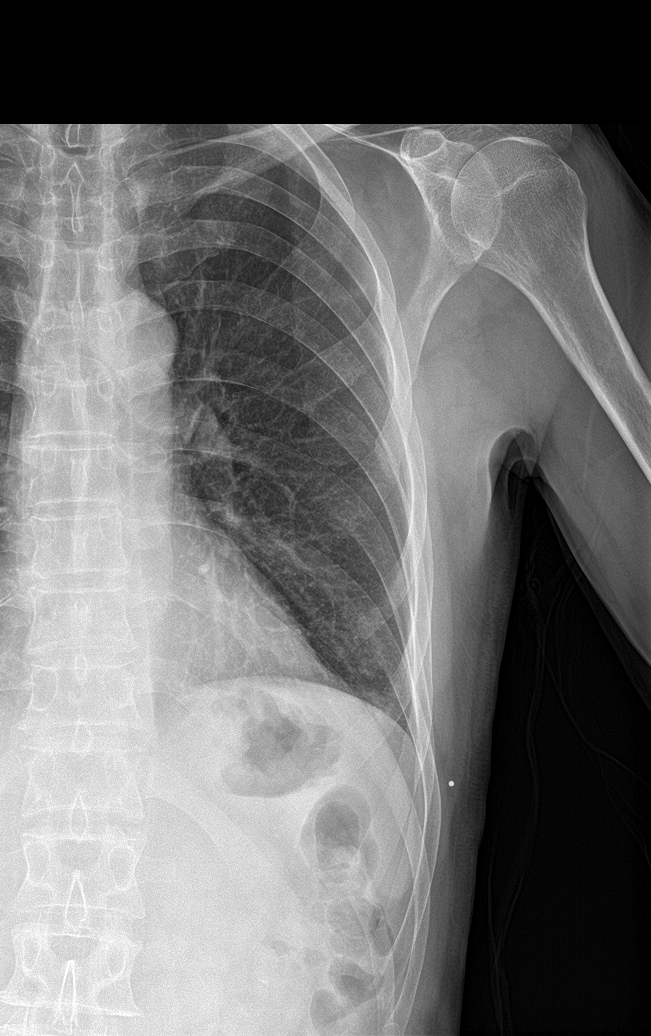
[im 3/5]
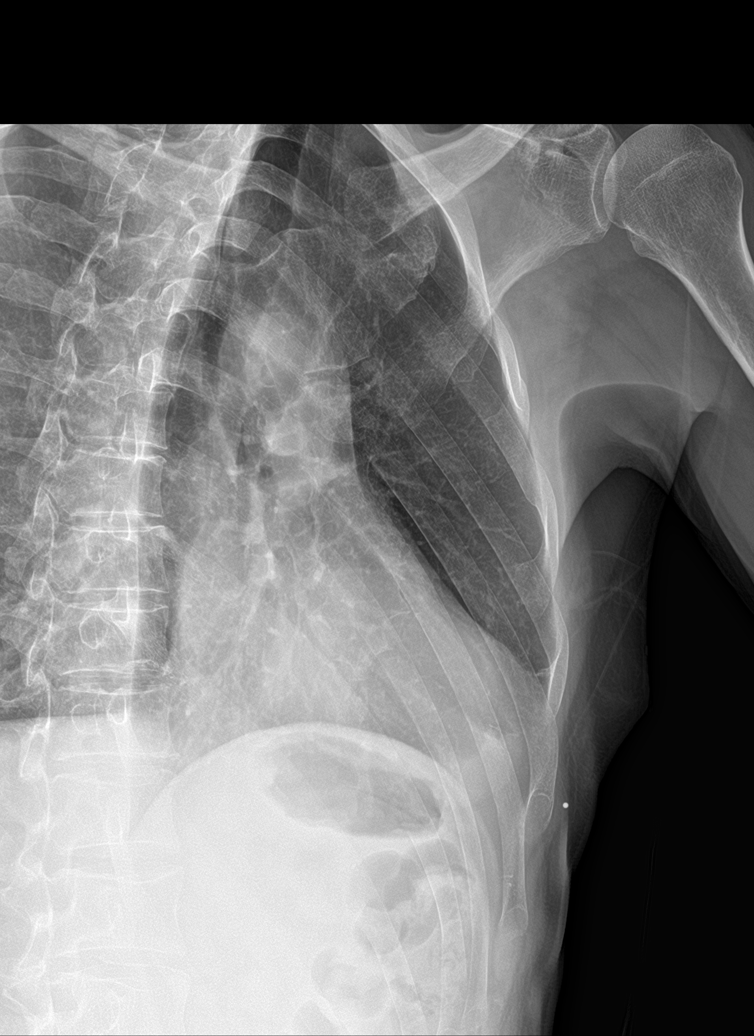
[im 4/5]
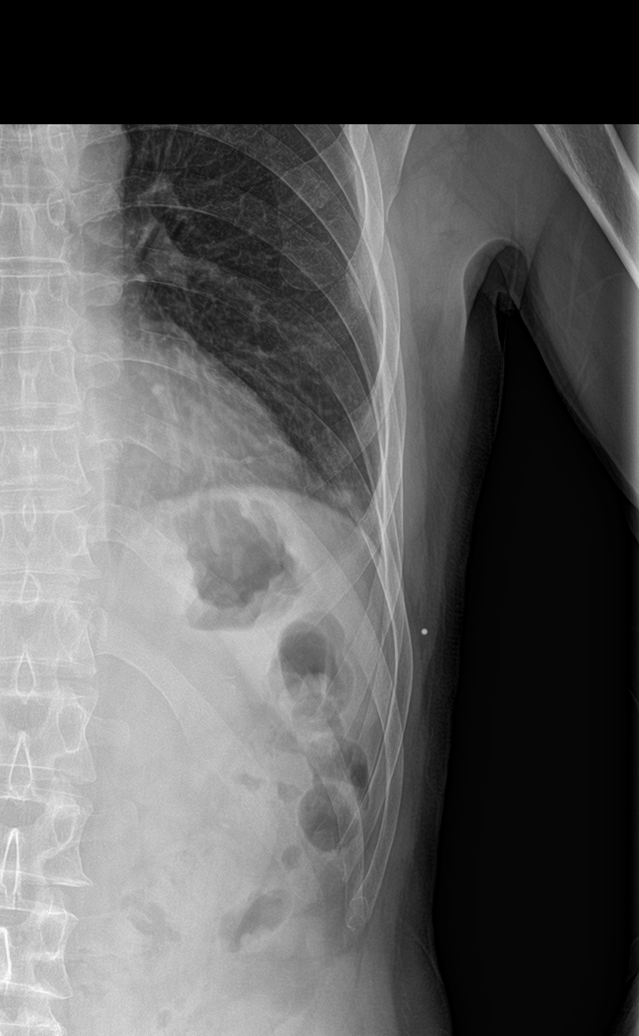
[im 5/5]
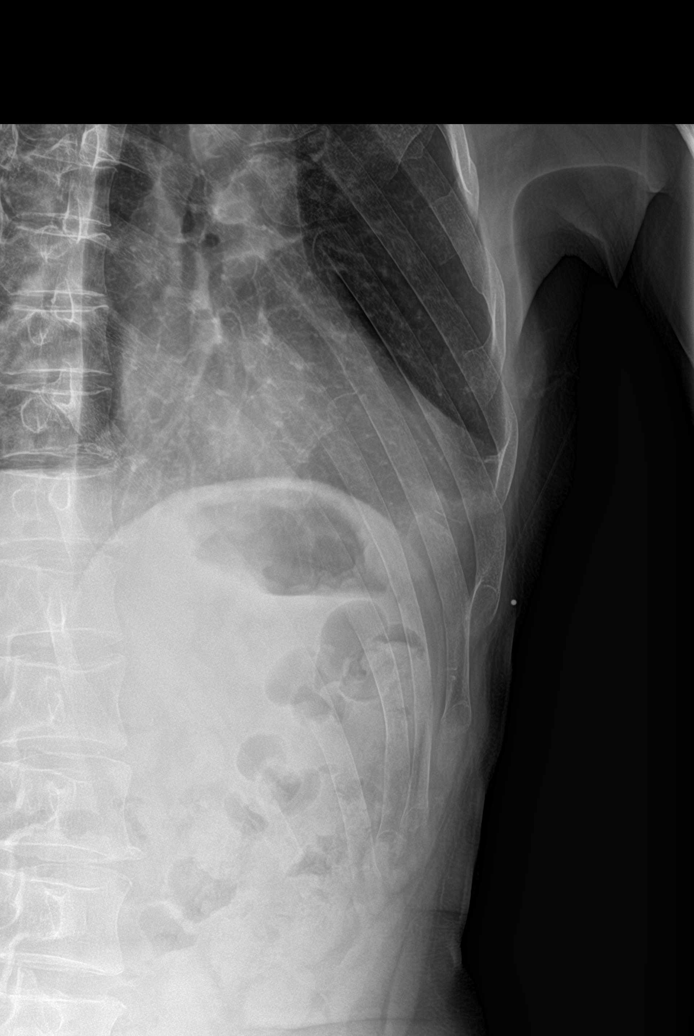

[5 of 5 positions shown; findings below may reference images not displayed]

FINDINGS: No acute left-sided rib fracture or bony lesion identified. Mild
healed irregularity of the lateral aspects of the third and fourth
ribs may relate to prior injury. There is no evidence of pulmonary
edema, consolidation, pneumothorax or pleural fluid.
IMPRESSION: No acute left-sided rib fracture identified. Possible old injuries
of the left third and fourth ribs.

## 2020-10-18 MED ORDER — FENTANYL CITRATE (PF) 100 MCG/2ML IJ SOLN
50.0000 ug | Freq: Once | INTRAMUSCULAR | Status: AC
  Administered 2020-10-18: 50 ug via INTRAVENOUS
  Filled 2020-10-18: qty 2

## 2020-10-18 MED ORDER — OXYCODONE-ACETAMINOPHEN 5-325 MG PO TABS: 1.0000 | ORAL_TABLET | ORAL | 0 refills | Status: DC | PRN

## 2020-10-18 NOTE — ED Provider Notes (Signed)
[facility] Emergency Department Provider Note  Time seen: 12:40 PM  I have reviewed the triage vital signs and the nursing notes.   HISTORY  Chief Complaint Fall and Rib Injury   HPI Shawn [name] is a 60 y.o. male with a past medical history of CAD, hyperlipidemia, CVA presents to the emergency department for fall.  According to the patient he uses a walker to ambulate since his CVA in 2019.  States his walker got stuck on a chair causing him to fall forward hitting the left side of his head and injuring his left shoulder and left chest.  Patient does not believe he passed out.  Patient's major complaint is left lateral chest wall pain into his left shoulder worse with movement.  Past Medical History:  Diagnosis Date  . Asthma   . Coronary artery disease   . Hyperlipidemia   . Renal stone   . Stroke [facility])     Patient Active Problem List   Diagnosis Date Noted  . Ureteral stone 05/20/2015    Past Surgical History:  Procedure Laterality Date  . CARDIAC CATHETERIZATION  2006   negative  . CYSTOSCOPY W/ URETERAL STENT REMOVAL Left 06/11/2015   Procedure: CYSTOSCOPY WITH STENT EXCHANGE;  Surgeon: Malen Gauze, MD;  Location: [facility] ORS;  Service: Urology;  Laterality: Left;  . CYSTOSCOPY WITH STENT PLACEMENT Left 05/20/2015   Procedure: CYSTOSCOPY WITH STENT PLACEMENT;  Surgeon: Crist Fat, MD;  Location: [facility] ORS;  Service: Urology;  Laterality: Left;  . CYSTOSCOPY/RETROGRADE/URETEROSCOPY/STONE EXTRACTION WITH BASKET  2000   pt. unsure of which side done  . CYSTOSCOPY/URETEROSCOPY/HOLMIUM LASER Left 06/11/2015   Procedure: CYSTOSCOPY/URETEROSCOPY/HOLMIUM LASER;  Surgeon: Malen Gauze, MD;  Location: [facility] ORS;  Service: Urology;  Laterality: Left;  . KNEE SURGERY Right 84,96, 2003   x3  . SHOULDER SURGERY Left 2010    Prior to Admission medications   Medication Sig Start Date End Date Taking? Authorizing Provider  albuterol  (PROVENTIL HFA;VENTOLIN HFA) 108 (90 BASE) MCG/ACT inhaler Inhale 1-2 puffs into the lungs as needed for wheezing or shortness of breath.    [provider]  atorvastatin (LIPITOR) 20 MG tablet Take 20 mg by mouth daily. 06/25/20   [provider]  methocarbamol (ROBAXIN) 500 MG tablet Take 1 tablet (500 mg total) by mouth 4 (four) times daily. 07/28/19   Cuthriell, Delorise Royals, PA-C  oxybutynin (DITROPAN) 5 MG tablet Take 5 mg by mouth 3 (three) times daily.    [provider]  oxyCODONE-acetaminophen (PERCOCET/ROXICET) 5-325 MG tablet Take 1 tablet by mouth every 6 (six) hours as needed for severe pain. 07/28/19   Cuthriell, Delorise Royals, PA-C  predniSONE (DELTASONE) 50 MG tablet Take 1 tablet (50 mg total) by mouth daily with breakfast. 07/28/19   Cuthriell, Delorise Royals, PA-C  tamsulosin (FLOMAX) 0.4 MG CAPS capsule Take 1 capsule (0.4 mg total) by mouth daily after supper. 05/20/15   Milagros Loll, MD    Allergies  Allergen Reactions  . Aspirin Other (See Comments), Itching, Shortness Of Breath and Swelling    Upset stomach   . Morphine And Related Itching  . Naprosyn [Naproxen] Other (See Comments)    Upset stomach   . Penicillins Rash  . Tramadol Itching    Family History  Problem Relation Age of Onset  . Cancer Mother   . Heart failure Mother   . Hypertension Mother   . CVA Mother   . Diabetes Other   .  Other Father        unknown medical history    Social History Social History   Tobacco Use  . Smoking status: Never Smoker  . Smokeless tobacco: Never Used  Vaping Use  . Vaping Use: Never used  Substance Use Topics  . Alcohol use: Not Currently    Alcohol/week: 0.0 standard drinks    Comment: occasionally  . Drug use: Never    Review of Systems Constitutional: Negative for fever. Cardiovascular: Negative for chest pain. Respiratory: Negative for shortness of breath. Gastrointestinal: Negative for abdominal pain, vomiting and  diarrhea. Genitourinary: Negative for urinary compaints Musculoskeletal: Left chest wall pain.  Left shoulder pain. Skin: Abrasion to left eyebrow Neurological: Negative for headache All other ROS negative  ____________________________________________   PHYSICAL EXAM:  VITAL SIGNS: ED Triage Vitals [10/18/20 0935]  Enc Vitals Group     BP      Pulse      Resp      Temp      Temp src      SpO2      Weight 140 lb (63.5 kg)     Height 5\' 10"  (1.778 m)     Head Circumference      Peak Flow      Pain Score 10     Pain Loc      Pain Edu?      Excl. in GC?    Constitutional: Patient is awake and alert, oriented.  No acute distress.  Slow speech which the patient states is baseline since his stroke Eyes: Normal exam ENT      Head: Abrasion to left eyebrow      Mouth/Throat: Mucous membranes are moist. Cardiovascular: Normal rate, regular rhythm.  Respiratory: Normal respiratory effort without tachypnea nor retractions. Breath sounds are clear.  Moderate left lateral chest wall tenderness to palpation. Gastrointestinal: Soft and nontender. No distention.  Musculoskeletal: Left shoulder tenderness to palpation, pain with range of motion. Neurologic:  Normal speech and language. No gross focal neurologic deficits Skin:  Skin is warm.  Small abrasion left eyebrow. Psychiatric: Mood and affect are normal.   ____________________________________________   RADIOLOGY  Rib x-ray negative Shoulder x-ray shows possible AC separation otherwise negative. CT head and C-spine negative. CT scan of the chest is negative for acute abnormality.   ____________________________________________   INITIAL IMPRESSION / ASSESSMENT AND PLAN / ED COURSE  Pertinent labs & imaging results that were available during my care of the patient were reviewed by me and considered in my medical decision making (see chart for details).   Patient presents to the emergency department after a fall in which  he hit his head and is experiencing left shoulder and left chest wall pain.  Rib x-rays show no acute fracture.  Given the patient's tenderness to palpation and pain in the left shoulder we will obtain CT imaging of the head and chest as a precaution.  Left shoulder x-rays to rule out fracture.  We will also obtain basic labs.  Patient's work-up is essentially reassuring.  Possible mild AC separation on shoulder x-ray otherwise normal imaging.  Labs are largely within normal limits as well.  Given the patient's reassuring work-up we will attempt to ambulate the patient.  As long as the patient is able to ambulate with a walker which is baseline patient will be discharged home.  Shawn [name] was evaluated in Emergency Department on 10/18/2020 for the symptoms described in the history of present illness. He  was evaluated in the context of the global COVID-19 pandemic, which necessitated consideration that the patient might be at risk for infection with the SARS-CoV-2 virus that causes COVID-19. Institutional protocols and algorithms that pertain to the evaluation of patients at risk for COVID-19 are in a state of rapid change based on information released by regulatory bodies including the CDC and federal and state organizations. These policies and algorithms were followed during the patient's care in the ED.  ____________________________________________   FINAL CLINICAL IMPRESSION(S) / ED DIAGNOSES  Thresa Ross, MD 10/18/20 1348

## 2020-10-18 NOTE — ED Notes (Signed)
Pt to CT and xray  

## 2020-10-18 NOTE — ED Notes (Signed)
Pt states he uses a walker for ambulating, and walker got caught on something and he fell on his right side hitting his ribs and head. Pt alert, able to move to bed with 2 assist.

## 2020-10-18 NOTE — ED Notes (Signed)
  Reviewed discharge instructions, follow up care and medications. Pt verbalizes understanding. Signature pad not work. PT d/c home via wheelchair

## 2020-10-18 NOTE — ED Triage Notes (Signed)
Pt reports his foot got caught on a walker and he fell last night hurting his left side ribs and left eye. Abrasion noted to left eye

## 2021-01-11 ENCOUNTER — Ambulatory Visit: Payer: Medicare Other

## 2021-01-11 ENCOUNTER — Ambulatory Visit (INDEPENDENT_AMBULATORY_CARE_PROVIDER_SITE_OTHER): Payer: Medicare Other

## 2021-01-11 ENCOUNTER — Ambulatory Visit
Admission: EM | Admit: 2021-01-11 | Discharge: 2021-01-11 | Disposition: A | Discharge status: Home / Self Care | Payer: Medicare Other | Attending: Family Medicine | Admitting: Family Medicine

## 2021-01-11 ENCOUNTER — Other Ambulatory Visit: Payer: Self-pay

## 2021-01-11 ENCOUNTER — Encounter: Payer: Self-pay | Admitting: Emergency Medicine

## 2021-01-11 DIAGNOSIS — M25531 Pain in right wrist: Secondary | ICD-10-CM

## 2021-01-11 DIAGNOSIS — M79641 Pain in right hand: Secondary | ICD-10-CM | POA: Diagnosis not present

## 2021-01-11 DIAGNOSIS — W19XXXA Unspecified fall, initial encounter: Secondary | ICD-10-CM | POA: Diagnosis not present

## 2021-01-11 HISTORY — DX: Amyotrophic lateral sclerosis: G12.21

## 2021-01-11 IMAGING — CR DG HAND COMPLETE 3+V*R*
3 series · 3 of 3 positions shown · non-contrast
Comparison: None.

CLINICAL DATA: Fell, right hand and wrist pain

EXAM:
RIGHT HAND - COMPLETE 3+ VIEW; RIGHT WRIST - COMPLETE 3+ VIEW

[hand ap]
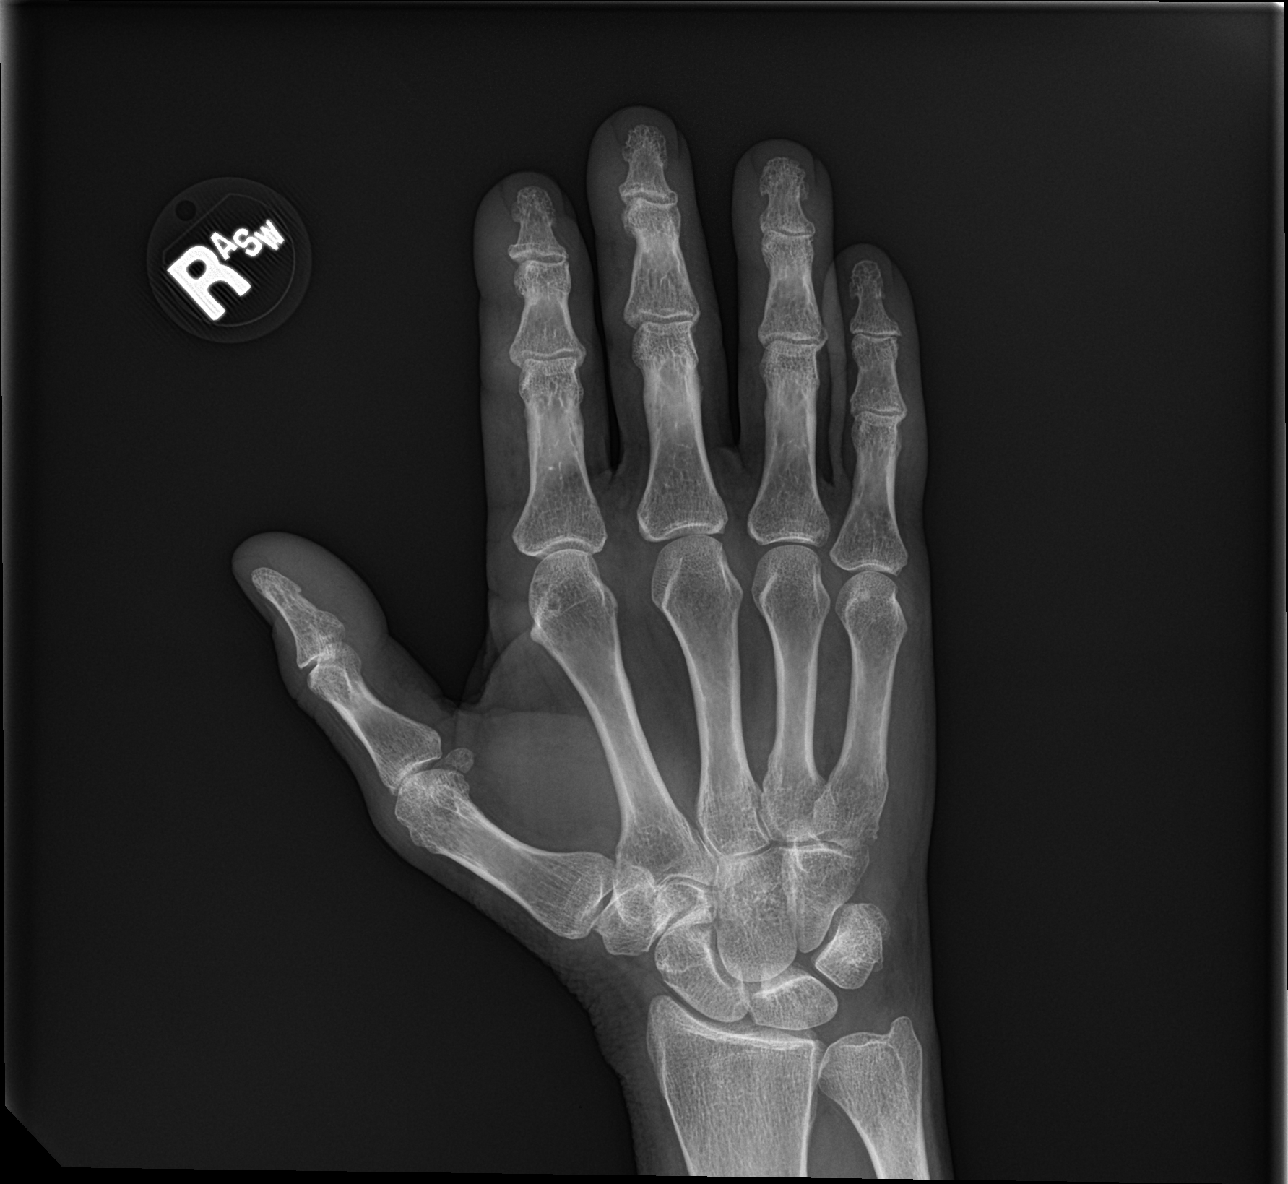

[hand obl]
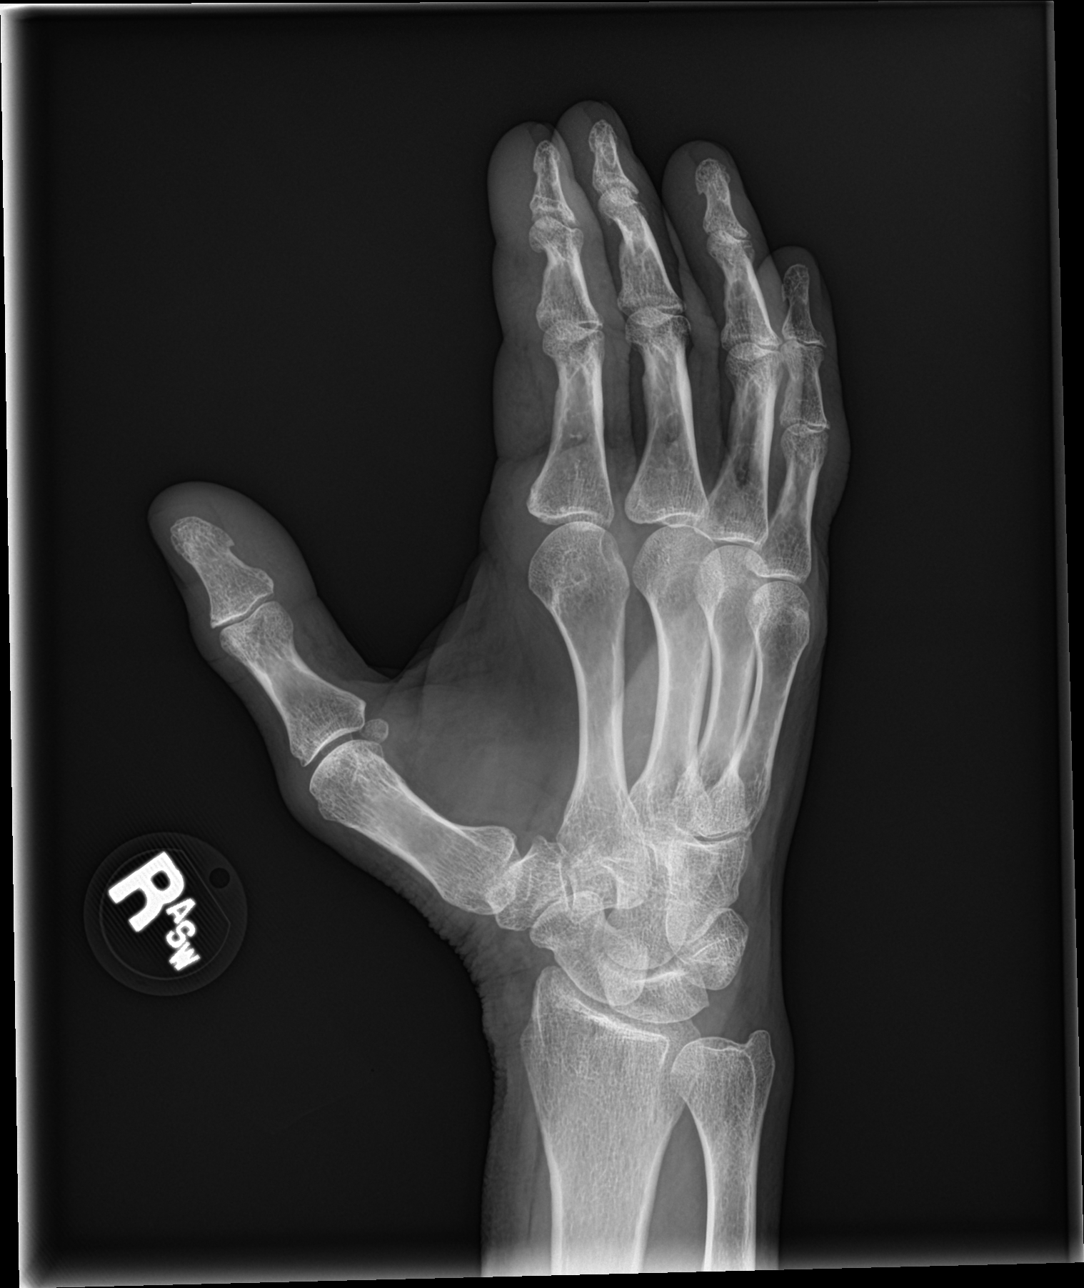

[hand lat]
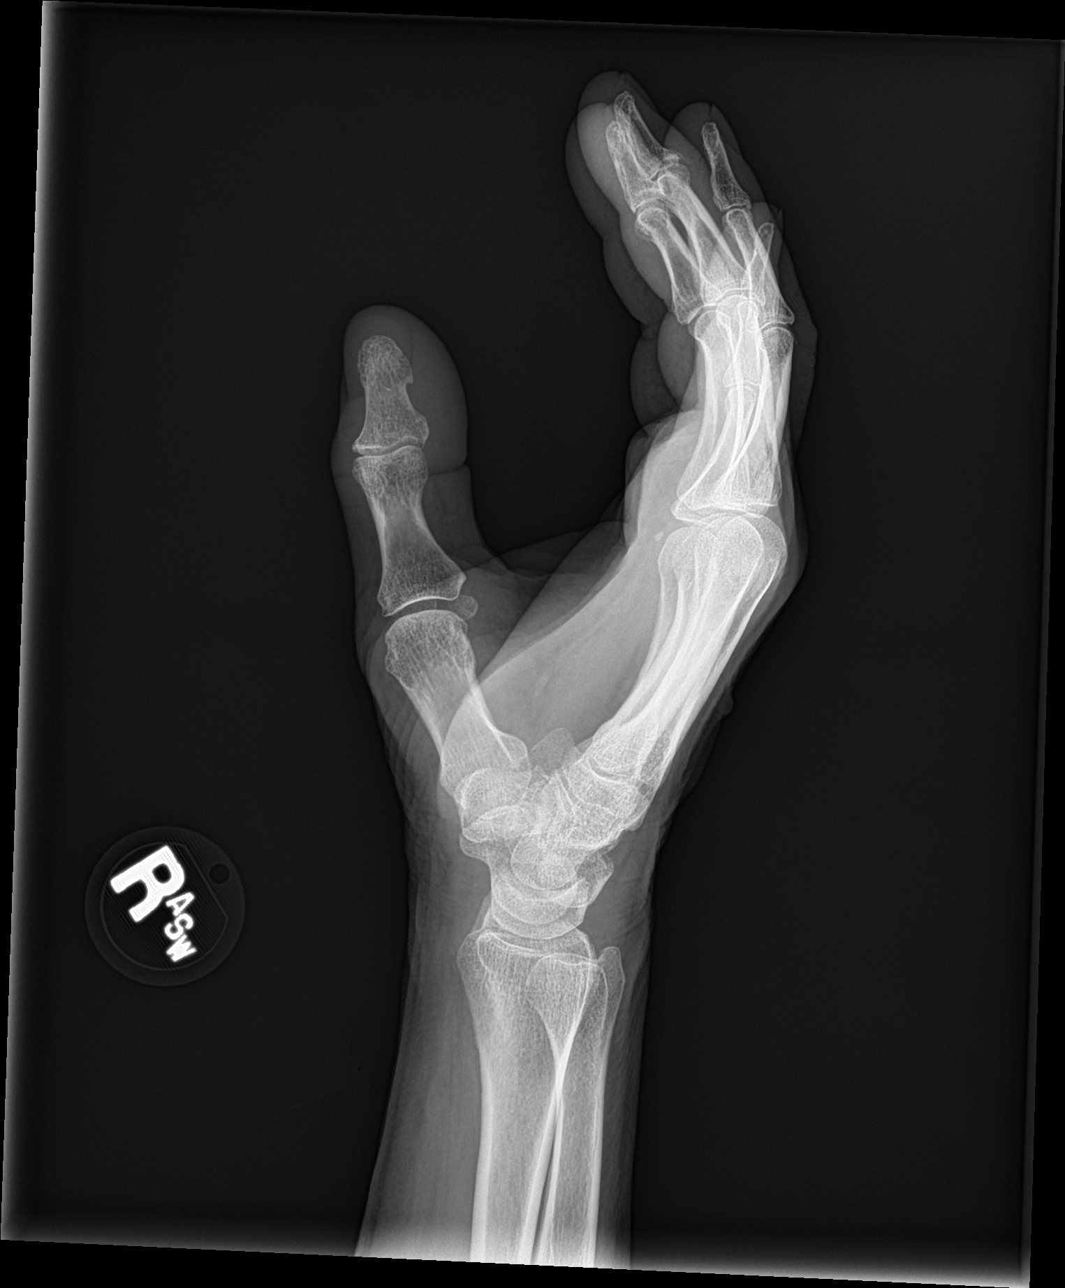

[3 of 3 positions shown; findings below may reference images not displayed]

FINDINGS: Right hand: Frontal, oblique, lateral views are obtained. No acute
fracture, subluxation, or dislocation. Mild diffuse distal
interphalangeal joint space narrowing consistent with
osteoarthritis. Soft tissues are unremarkable.

Right wrist: Frontal, oblique, lateral, and ulnar deviated views of
the right wrist are obtained. No fracture, subluxation, or
dislocation. Joint spaces are well preserved. Soft tissues are
normal.
IMPRESSION: 1. Osteoarthritis.
2. No acute displaced fracture.

## 2021-01-11 IMAGING — CR DG WRIST COMPLETE 3+V*R*
4 series · 4 of 4 positions shown · non-contrast
Comparison: None.

CLINICAL DATA: Fell, right hand and wrist pain

EXAM:
RIGHT HAND - COMPLETE 3+ VIEW; RIGHT WRIST - COMPLETE 3+ VIEW

[wrist pa]
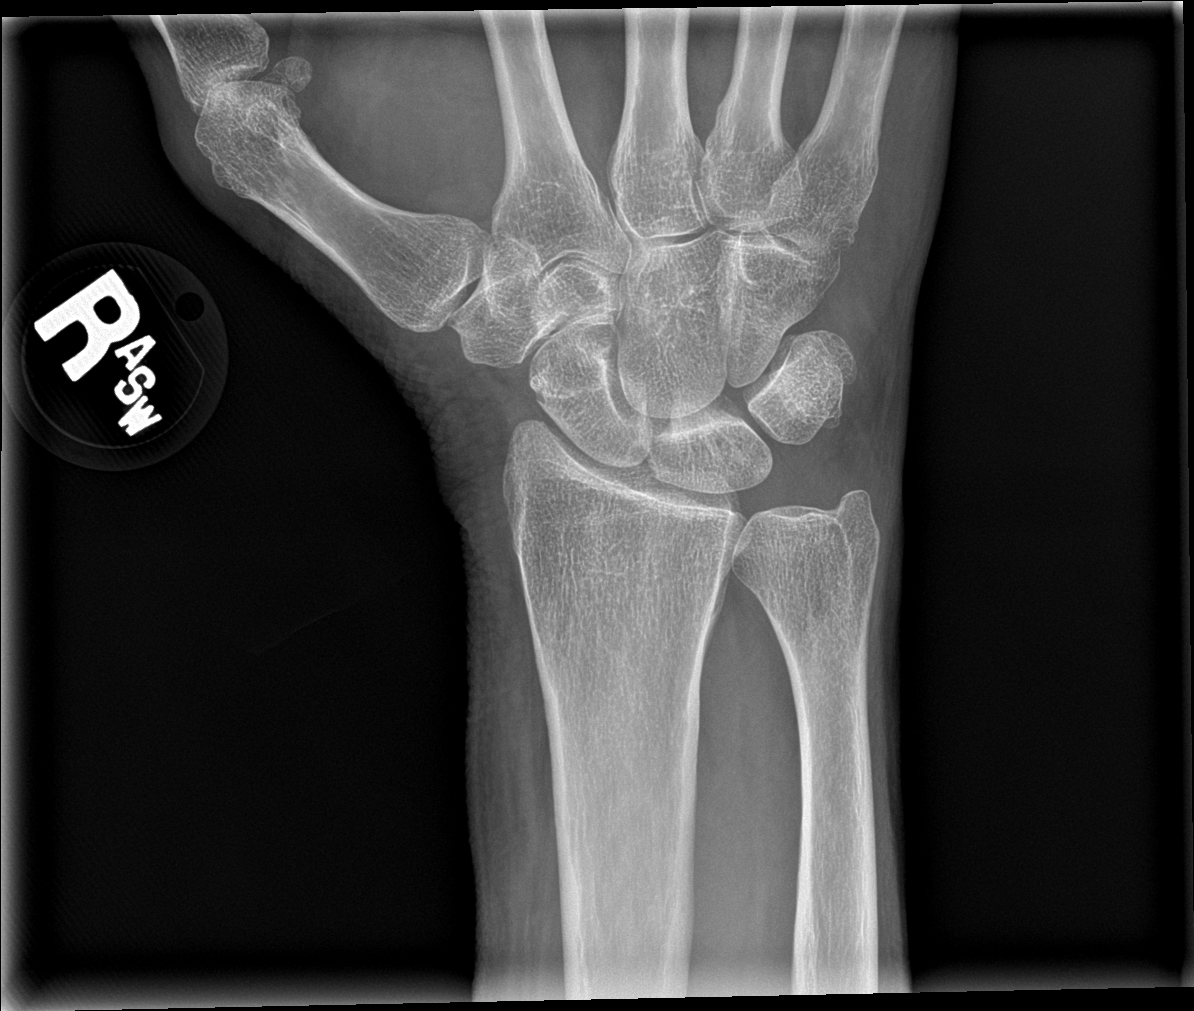

[wrist obl]
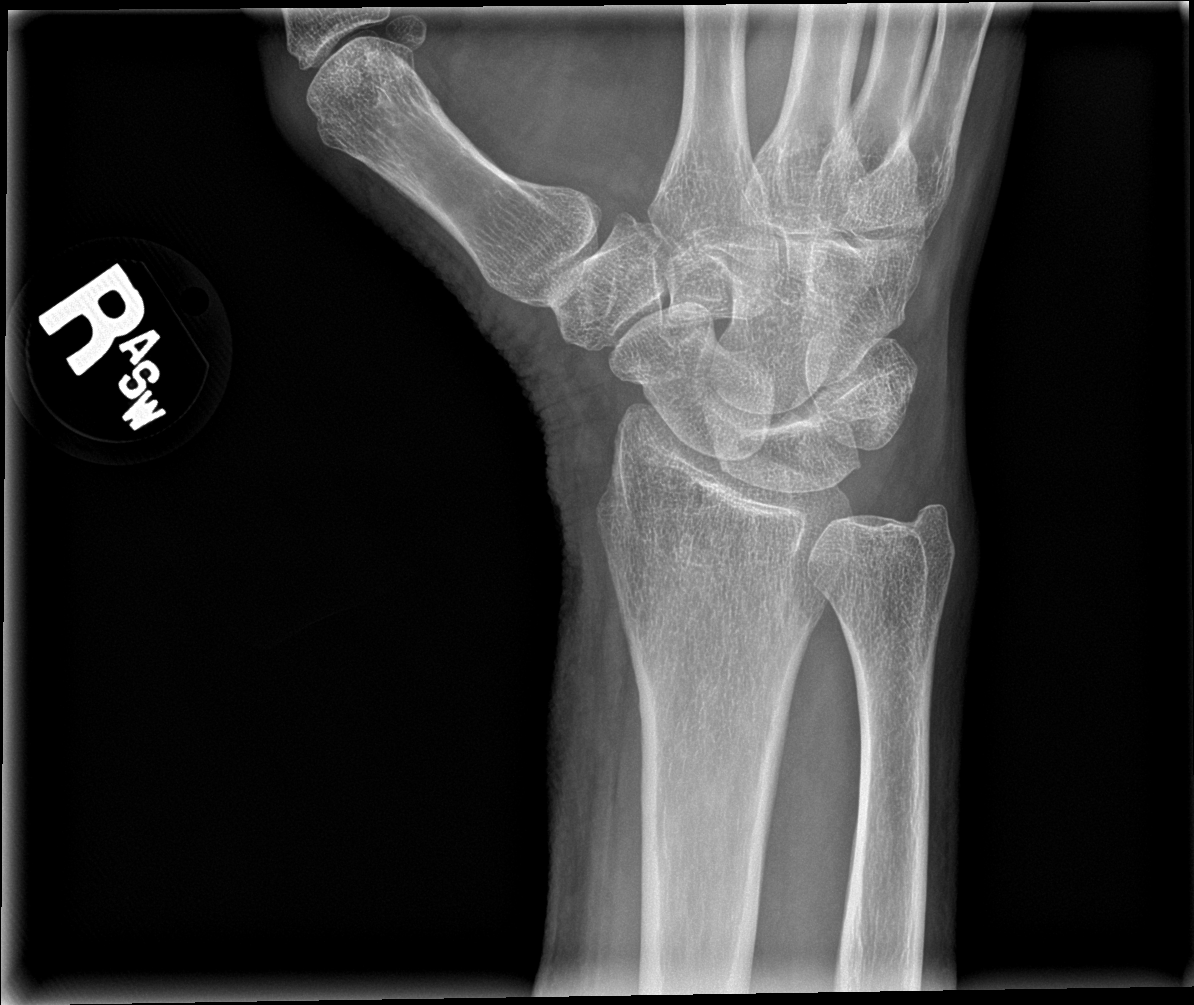

[wrist lat]
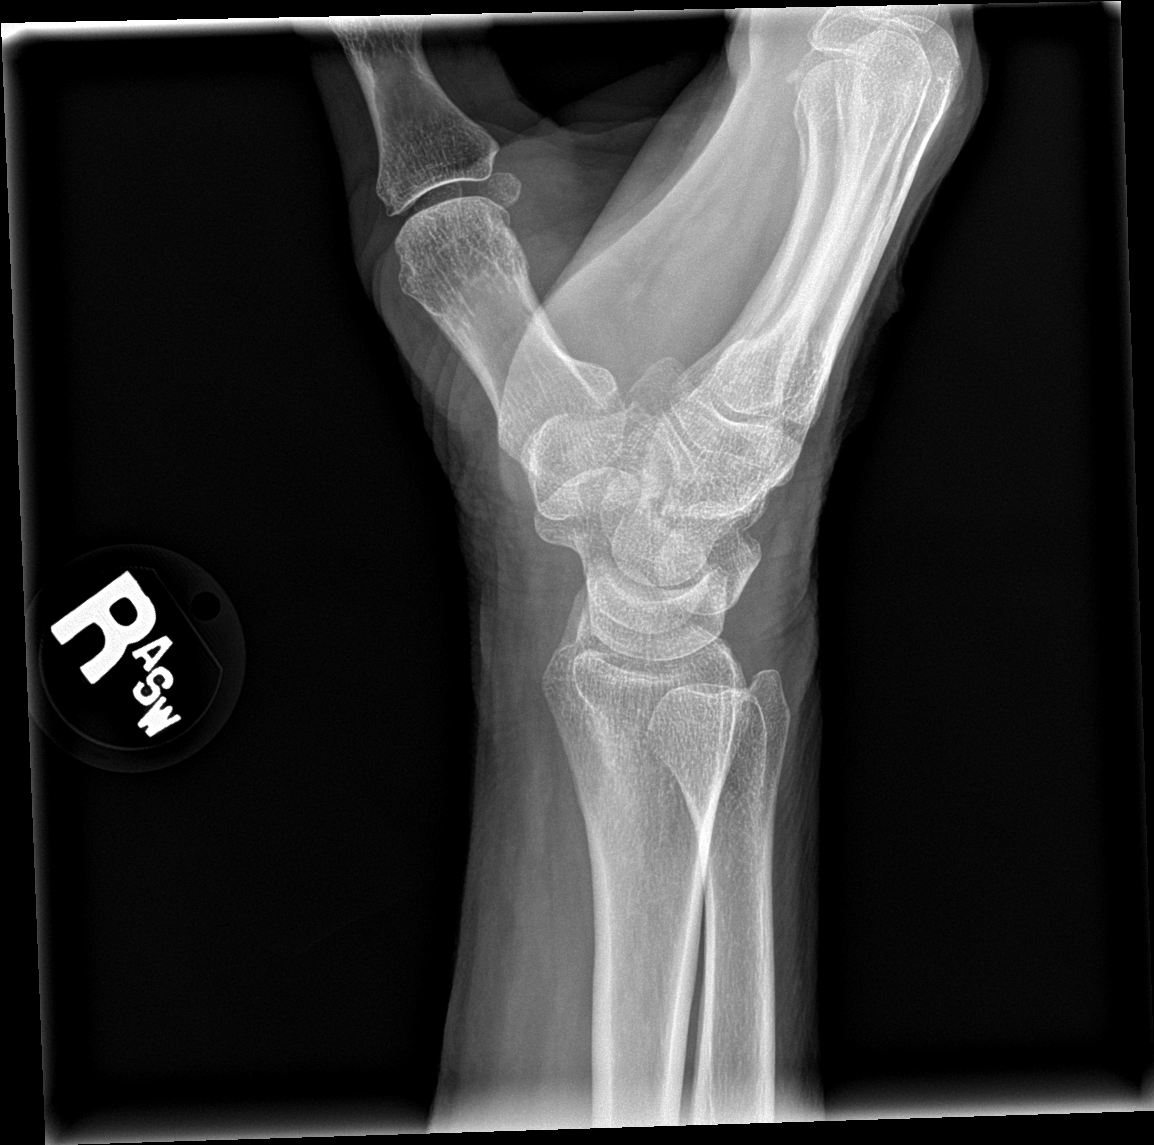

[wrist navicular]
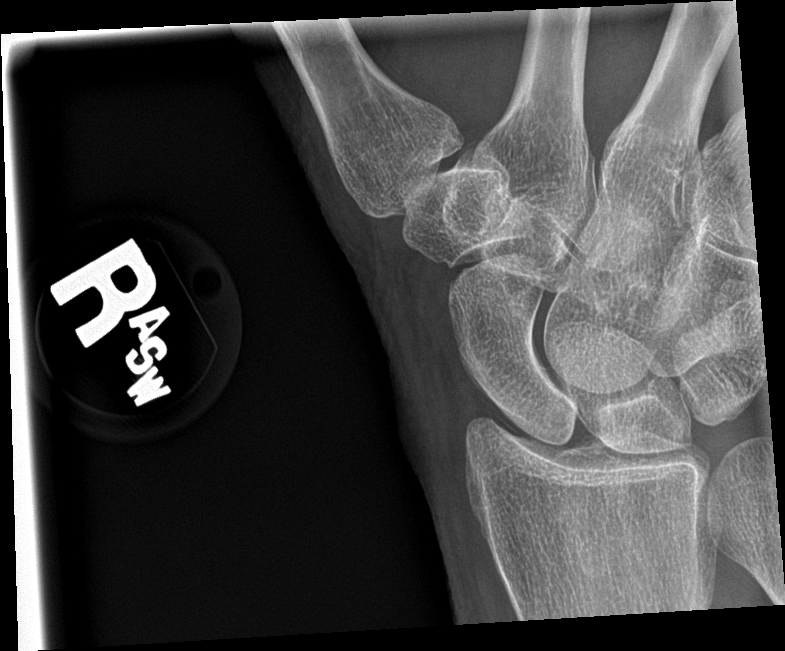

[4 of 4 positions shown; findings below may reference images not displayed]

FINDINGS: Right hand: Frontal, oblique, lateral views are obtained. No acute
fracture, subluxation, or dislocation. Mild diffuse distal
interphalangeal joint space narrowing consistent with
osteoarthritis. Soft tissues are unremarkable.

Right wrist: Frontal, oblique, lateral, and ulnar deviated views of
the right wrist are obtained. No fracture, subluxation, or
dislocation. Joint spaces are well preserved. Soft tissues are
normal.
IMPRESSION: 1. Osteoarthritis.
2. No acute displaced fracture.

## 2021-01-11 NOTE — ED Provider Notes (Signed)
[facility]    CSN: [id number] Arrival date & time: 01/11/21  1600  History   Chief Complaint Chief Complaint  Patient presents with  . Fall    DOI 01/11/21  . Hand Pain   HPI  60 year old male presents with the above complaint.  Patient states that he suffered a fall today.  He fell forward on concrete and injured his right hand.  He also reports pain of the right wrist and of the right elbow.  Patient states that he has difficulty bending his fingers.  He has iced his hand without resolution.  No other medications or interventions tried.  Rates his pain as 10/10 in severity.  No other complaints or concerns at this time.  Past Medical History:  Diagnosis Date  . ALS (amyotrophic lateral sclerosis) (HCC)   . Asthma   . Coronary artery disease   . Hyperlipidemia   . Renal stone   . Stroke [facility])     Patient Active Problem List   Diagnosis Date Noted  . Ureteral stone 05/20/2015    Past Surgical History:  Procedure Laterality Date  . CARDIAC CATHETERIZATION  2006   negative  . CYSTOSCOPY W/ URETERAL STENT REMOVAL Left 06/11/2015   Procedure: CYSTOSCOPY WITH STENT EXCHANGE;  Surgeon: Malen Gauze, MD;  Location: [facility] ORS;  Service: Urology;  Laterality: Left;  . CYSTOSCOPY WITH STENT PLACEMENT Left 05/20/2015   Procedure: CYSTOSCOPY WITH STENT PLACEMENT;  Surgeon: Crist Fat, MD;  Location: [facility] OR[facility];  Service: Urology;  Laterality: Left;  . CYSTOSCOPY/RETROGRADE/URETEROSCOPY/STONE EXTRACTION WITH BASKET  2000   pt. unsure of which side done  . CYSTOSCOPY/URETEROSCOPY/HOLMIUM LASER Left 06/11/2015   Procedure: CYSTOSCOPY/URETEROSCOPY/HOLMIUM LASER;  Surgeon: Malen Gauze, MD;  Location: [facility] ORS;  Service: Urology;  Laterality: Left;  . KNEE SURGERY Right 84,96, 2003   x3  . SHOULDER SURGERY Left 2010       Home Medications    Prior to Admission medications   Medication Sig Start Date End Date Taking? Authorizing Provider  albuterol  (PROVENTIL HFA;VENTOLIN HFA) 108 (90 BASE) MCG/ACT inhaler Inhale 1-2 puffs into the lungs as needed for wheezing or shortness of breath.   Yes [provider]  atorvastatin (LIPITOR) 20 MG tablet Take 20 mg by mouth daily. 06/25/20  Yes [provider]  Baclofen 5 MG TABS TAKE 2 TABLETS (10 MG TOTAL) BY MOUTH 3 (THREE) TIMES DAILY 12/20/20  Yes [provider]  gabapentin (NEURONTIN) 250 MG/5ML solution TAKE 5 ML EVERY 8 HOURS FOR PAIN 11/19/20  Yes [provider]  montelukast (SINGULAIR) 10 MG tablet Take 1 tablet by mouth at bedtime. 12/21/20  Yes [provider]  riluzole (RILUTEK) 50 MG tablet TAKE 1 TABLET BY MOUTH DAILY FOR 3 DAYS, THEN TAKE 1 TAB TWICE DAILY THEREAFTER 12/27/20  Yes [provider]  traZODone (DESYREL) 50 MG tablet Take 50 mg by mouth at bedtime. 12/21/20  Yes [provider]    Family History Family History  Problem Relation Age of Onset  . Cancer Mother   . Heart failure Mother   . Hypertension Mother   . CVA Mother   . Diabetes Other   . Other Father        unknown medical history    Social History Social History   Tobacco Use  . Smoking status: Never Smoker  . Smokeless tobacco: Never Used  Vaping Use  . Vaping Use: Never used  Substance Use Topics  . Alcohol use:  Not Currently    Alcohol/week: 0.0 standard drinks    Comment: occasionally  . Drug use: Never     Allergies   Aspirin, Morphine and related, Naproxen, Penicillins, and Tramadol   Review of Systems Review of Systems Per HPI  Physical Exam Triage Vital Signs ED Triage Vitals  Enc Vitals Group     BP 01/11/21 1624 (!) 130/94     Pulse Rate 01/11/21 1624 72     Resp 01/11/21 1624 18     Temp 01/11/21 1624 98.3 F (36.8 C)     Temp Source 01/11/21 1624 Oral     SpO2 01/11/21 1624 97 %     Weight 01/11/21 1625 135 lb (61.2 kg)     Height 01/11/21 1625 5\' 10"  (1.778 m)     Head Circumference --      Peak Flow --       Pain Score 01/11/21 1625 10     Pain Loc --      Pain Edu? --      Excl. in GC? --    Updated Vital Signs BP (!) 130/94 (BP Location: Left Arm)   Pulse 72   Temp 98.3 F (36.8 C) (Oral)   Resp 18   Ht 5\' 10"  (1.778 m)   Wt 61.2 kg   SpO2 97%   BMI 19.37 kg/m   Visual Acuity Right Eye Distance:   Left Eye Distance:   Bilateral Distance:    Right Eye Near:   Left Eye Near:    Bilateral Near:     Physical Exam Vitals and nursing note reviewed.  Constitutional:      General: He is not in acute distress.    Appearance: Normal appearance. He is not ill-appearing.  HENT:     Head: Normocephalic and atraumatic.  Eyes:     General:        Right eye: No discharge.        Left eye: No discharge.     Conjunctiva/sclera: Conjunctivae normal.  Pulmonary:     Effort: Pulmonary effort is normal. No respiratory distress.  Neurological:     Mental Status: He is alert.  Psychiatric:        Mood and Affect: Mood normal.        Behavior: Behavior normal.    UC Treatments / Results  Labs (all labs ordered are listed, but only abnormal results are displayed) Labs Reviewed - No data to display  EKG   Radiology DG Wrist Complete Right  Result Date: 01/11/2021 CLINICAL DATA:  , right hand and wrist pain EXAM: RIGHT HAND - COMPLETE 3+ VIEW; RIGHT WRIST - COMPLETE 3+ VIEW COMPARISON:  None. FINDINGS: Right hand: Frontal, oblique, lateral views are obtained. No acute fracture, subluxation, or dislocation. Mild diffuse distal interphalangeal joint space narrowing consistent with osteoarthritis. Soft tissues are unremarkable. Right wrist: Frontal, oblique, lateral, and ulnar deviated views of the right wrist are obtained. No fracture, subluxation, or dislocation. Joint spaces are well preserved. Soft tissues are normal. IMPRESSION: 1. Osteoarthritis. 2. No acute displaced fracture. Electronically Signed   By: 03/13/2021 M.D.   On: 01/11/2021 17:05   DG Hand Complete  Right  Result Date: 01/11/2021 CLINICAL DATA:  03/13/2021, right hand and wrist pain EXAM: RIGHT HAND - COMPLETE 3+ VIEW; RIGHT WRIST - COMPLETE 3+ VIEW COMPARISON:  None. FINDINGS: Right hand: Frontal, oblique, lateral views are obtained. No acute fracture, subluxation, or dislocation. Mild diffuse distal interphalangeal joint space narrowing consistent  with osteoarthritis. Soft tissues are unremarkable. Right wrist: Frontal, oblique, lateral, and ulnar deviated views of the right wrist are obtained. No fracture, subluxation, or dislocation. Joint spaces are well preserved. Soft tissues are normal. IMPRESSION: 1. Osteoarthritis. 2. No acute displaced fracture. Electronically Signed   By: Sharlet Salina M.D.   On: 01/11/2021 17:05    Procedures Procedures (including critical care time)  Medications Ordered in UC Medications - No data to display  Initial Impression / Assessment and Plan / UC Course  I have reviewed the triage vital signs and the nursing notes.  Pertinent labs & imaging results that were available during my care of the patient were reviewed by me and considered in my medical decision making (see chart for details).    60 year old male presents with injury to the right hand and right wrist.  X-rays were obtained and were independently reviewed by me.  No evidence of fracture.  Advised use of Tylenol as directed.  He cannot tolerate NSAIDs or opiates due to allergy.  Supportive care.  Final Clinical Impressions(s) / UC Diagnoses   Final diagnoses:  Pain of right hand  Right wrist pain     Discharge Instructions     Tylenol 1000 mg three times daily as needed.  Rest, ice, elevate.  Take care  Dr. Adriana Simas     ED Prescriptions    None     PDMP not reviewed this encounter.   Tommie Sams, [place] 01/11/21 Silva Bandy

## 2021-01-11 NOTE — ED Triage Notes (Signed)
Patient in today after falling on concrete today and caught his weight with his right hand and fingertips Patient states he heard his hand an finger pop. Patient states he can't bend his fingers.Patient states the pain radiated from his hand up his arm. Patient has iced his hand.

## 2021-01-11 NOTE — Discharge Instructions (Signed)
Tylenol 1000 mg three times daily as needed.  Rest, ice, elevate.  Take care  Dr. Adriana Simas

## 2021-05-19 ENCOUNTER — Emergency Department: Payer: Medicare Other

## 2021-05-19 ENCOUNTER — Emergency Department
Admission: EM | Admit: 2021-05-19 | Discharge: 2021-05-20 | Disposition: A | Discharge status: Home / Self Care | Payer: Medicare Other | Attending: Emergency Medicine | Admitting: Emergency Medicine

## 2021-05-19 ENCOUNTER — Other Ambulatory Visit: Payer: Self-pay

## 2021-05-19 DIAGNOSIS — M549 Dorsalgia, unspecified: Secondary | ICD-10-CM | POA: Diagnosis present

## 2021-05-19 DIAGNOSIS — N200 Calculus of kidney: Secondary | ICD-10-CM

## 2021-05-19 DIAGNOSIS — J45909 Unspecified asthma, uncomplicated: Secondary | ICD-10-CM | POA: Diagnosis not present

## 2021-05-19 DIAGNOSIS — R509 Fever, unspecified: Secondary | ICD-10-CM | POA: Diagnosis not present

## 2021-05-19 DIAGNOSIS — Z20822 Contact with and (suspected) exposure to covid-19: Secondary | ICD-10-CM | POA: Diagnosis not present

## 2021-05-19 DIAGNOSIS — R519 Headache, unspecified: Secondary | ICD-10-CM | POA: Insufficient documentation

## 2021-05-19 DIAGNOSIS — I2510 Atherosclerotic heart disease of native coronary artery without angina pectoris: Secondary | ICD-10-CM | POA: Diagnosis not present

## 2021-05-19 DIAGNOSIS — Z79899 Other long term (current) drug therapy: Secondary | ICD-10-CM | POA: Insufficient documentation

## 2021-05-19 DIAGNOSIS — R112 Nausea with vomiting, unspecified: Secondary | ICD-10-CM | POA: Insufficient documentation

## 2021-05-19 DIAGNOSIS — R079 Chest pain, unspecified: Secondary | ICD-10-CM | POA: Insufficient documentation

## 2021-05-19 DIAGNOSIS — R0789 Other chest pain: Secondary | ICD-10-CM

## 2021-05-19 LAB — CBC
HCT: 38.5 % — ABNORMAL LOW (ref 39.0–52.0)
Hemoglobin: 13.4 g/dL (ref 13.0–17.0)
MCH: 34.8 pg — ABNORMAL HIGH (ref 26.0–34.0)
MCHC: 34.8 g/dL (ref 30.0–36.0)
MCV: 100 fL (ref 80.0–100.0)
Platelets: 203 10*3/uL (ref 150–400)
RBC: 3.85 MIL/uL — ABNORMAL LOW (ref 4.22–5.81)
RDW: 12.2 % (ref 11.5–15.5)
WBC: 5 10*3/uL (ref 4.0–10.5)
nRBC: 0 % (ref 0.0–0.2)

## 2021-05-19 LAB — URINALYSIS, COMPLETE (UACMP) WITH MICROSCOPIC
Bacteria, UA: NONE SEEN
Bilirubin Urine: NEGATIVE
Glucose, UA: NEGATIVE mg/dL
Hgb urine dipstick: NEGATIVE
Ketones, ur: 80 mg/dL — AB
Leukocytes,Ua: NEGATIVE
Nitrite: NEGATIVE
Protein, ur: NEGATIVE mg/dL
Specific Gravity, Urine: 1.025 (ref 1.005–1.030)
Squamous Epithelial / HPF: NONE SEEN (ref 0–5)
pH: 5 (ref 5.0–8.0)

## 2021-05-19 LAB — BASIC METABOLIC PANEL
Anion gap: 5 (ref 5–15)
BUN: 10 mg/dL (ref 8–23)
CO2: 24 mmol/L (ref 22–32)
Calcium: 8.9 mg/dL (ref 8.9–10.3)
Chloride: 106 mmol/L (ref 98–111)
Creatinine, Ser: 0.75 mg/dL (ref 0.61–1.24)
GFR, Estimated: >60 mL/min (ref >60)
Glucose, Bld: 101 mg/dL — ABNORMAL HIGH (ref 70–99)
Potassium: 3.7 mmol/L (ref 3.5–5.1)
Sodium: 135 mmol/L (ref 135–145)

## 2021-05-19 LAB — TROPONIN I (HIGH SENSITIVITY)
Troponin I (High Sensitivity): 3 ng/L (ref <18)
Troponin I (High Sensitivity): 2 ng/L (ref <18)

## 2021-05-19 IMAGING — CR DG CHEST 2V
2 series · 2 of 2 positions shown · non-contrast
Comparison: Chest x-ray [DATE], CT chest [DATE]

CLINICAL DATA: Chest pain

EXAM:
CHEST - 2 VIEW

[chest lat]
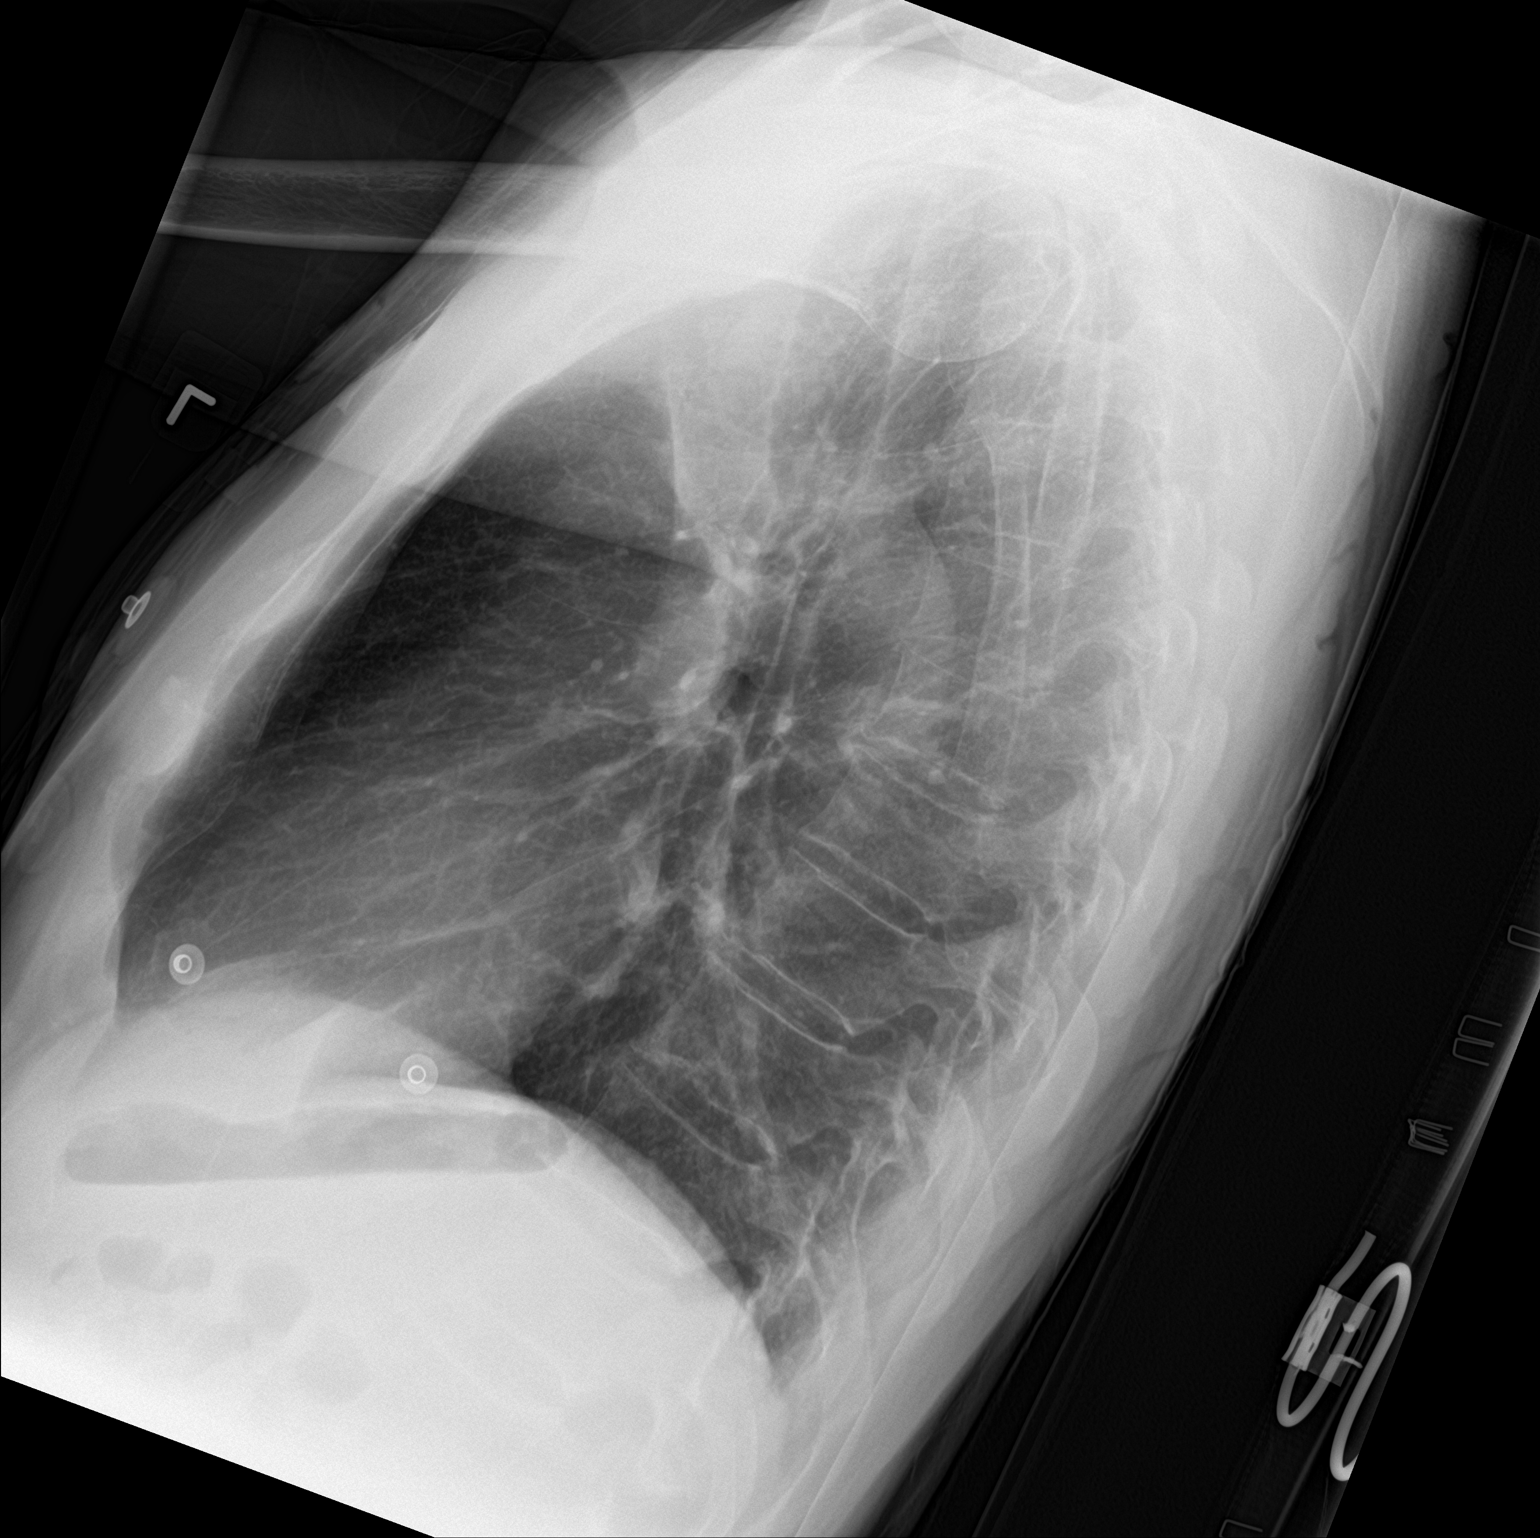

[chest ap]
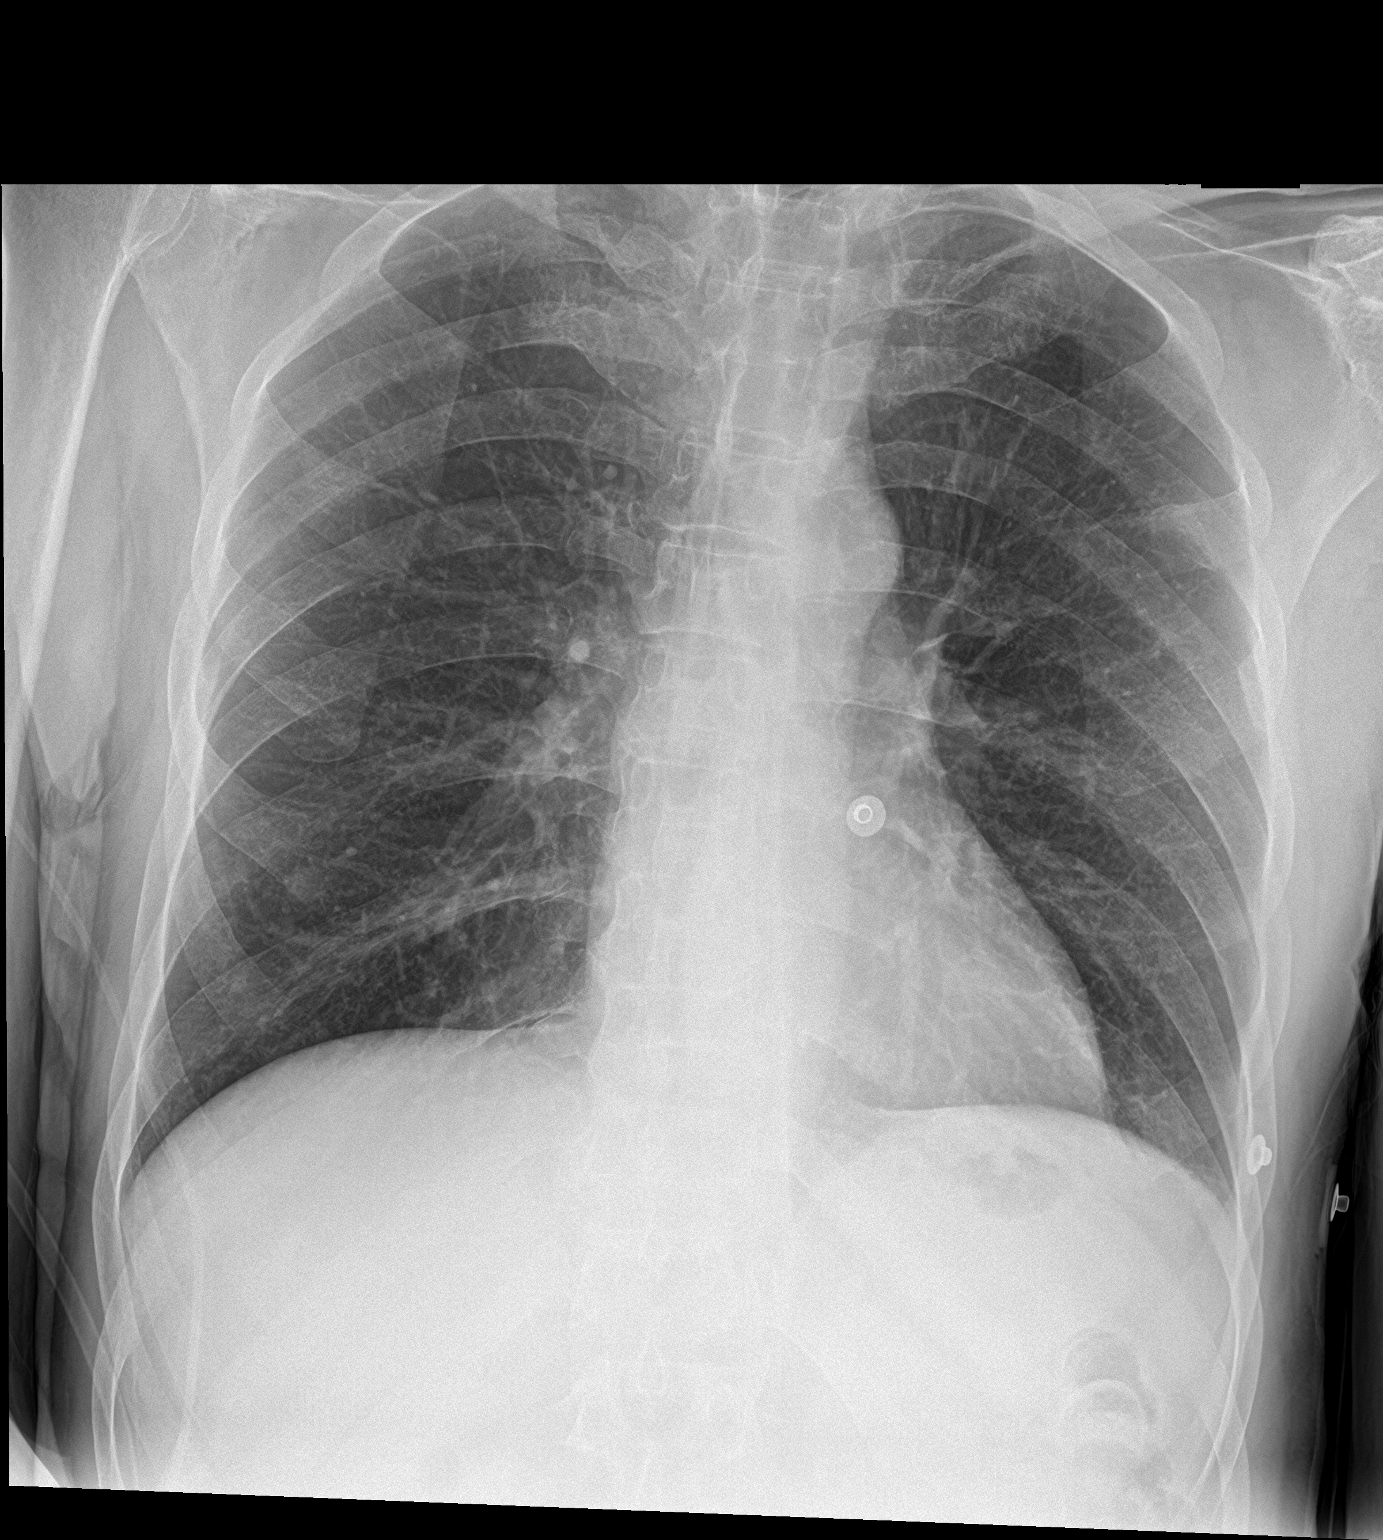

[2 of 2 positions shown; findings below may reference images not displayed]

FINDINGS: The heart and mediastinal contours are within normal limits.

Trace biapical pleural/pulmonary scarring. There is a 1cm round
density overlying the right lower lung zone. No focal consolidation.
No pulmonary edema. No pleural effusion. No pneumothorax.

No acute osseous abnormality.
IMPRESSION: 1. No active cardiopulmonary disease.
2. A 1cm round density overlying the right lower lung zone likely
represents a nipple shadow. Recommend repeat frontal view with
nipple marker.

## 2021-05-19 IMAGING — DX DG CHEST 1V
1 series · 1 of 1 positions shown · non-contrast
Comparison: [DATE] [DATE], [DATE] ([DATE] p.m.)

CLINICAL DATA: Repeat study with nipple marker.

EXAM:
CHEST  1 VIEW

[chest ap]
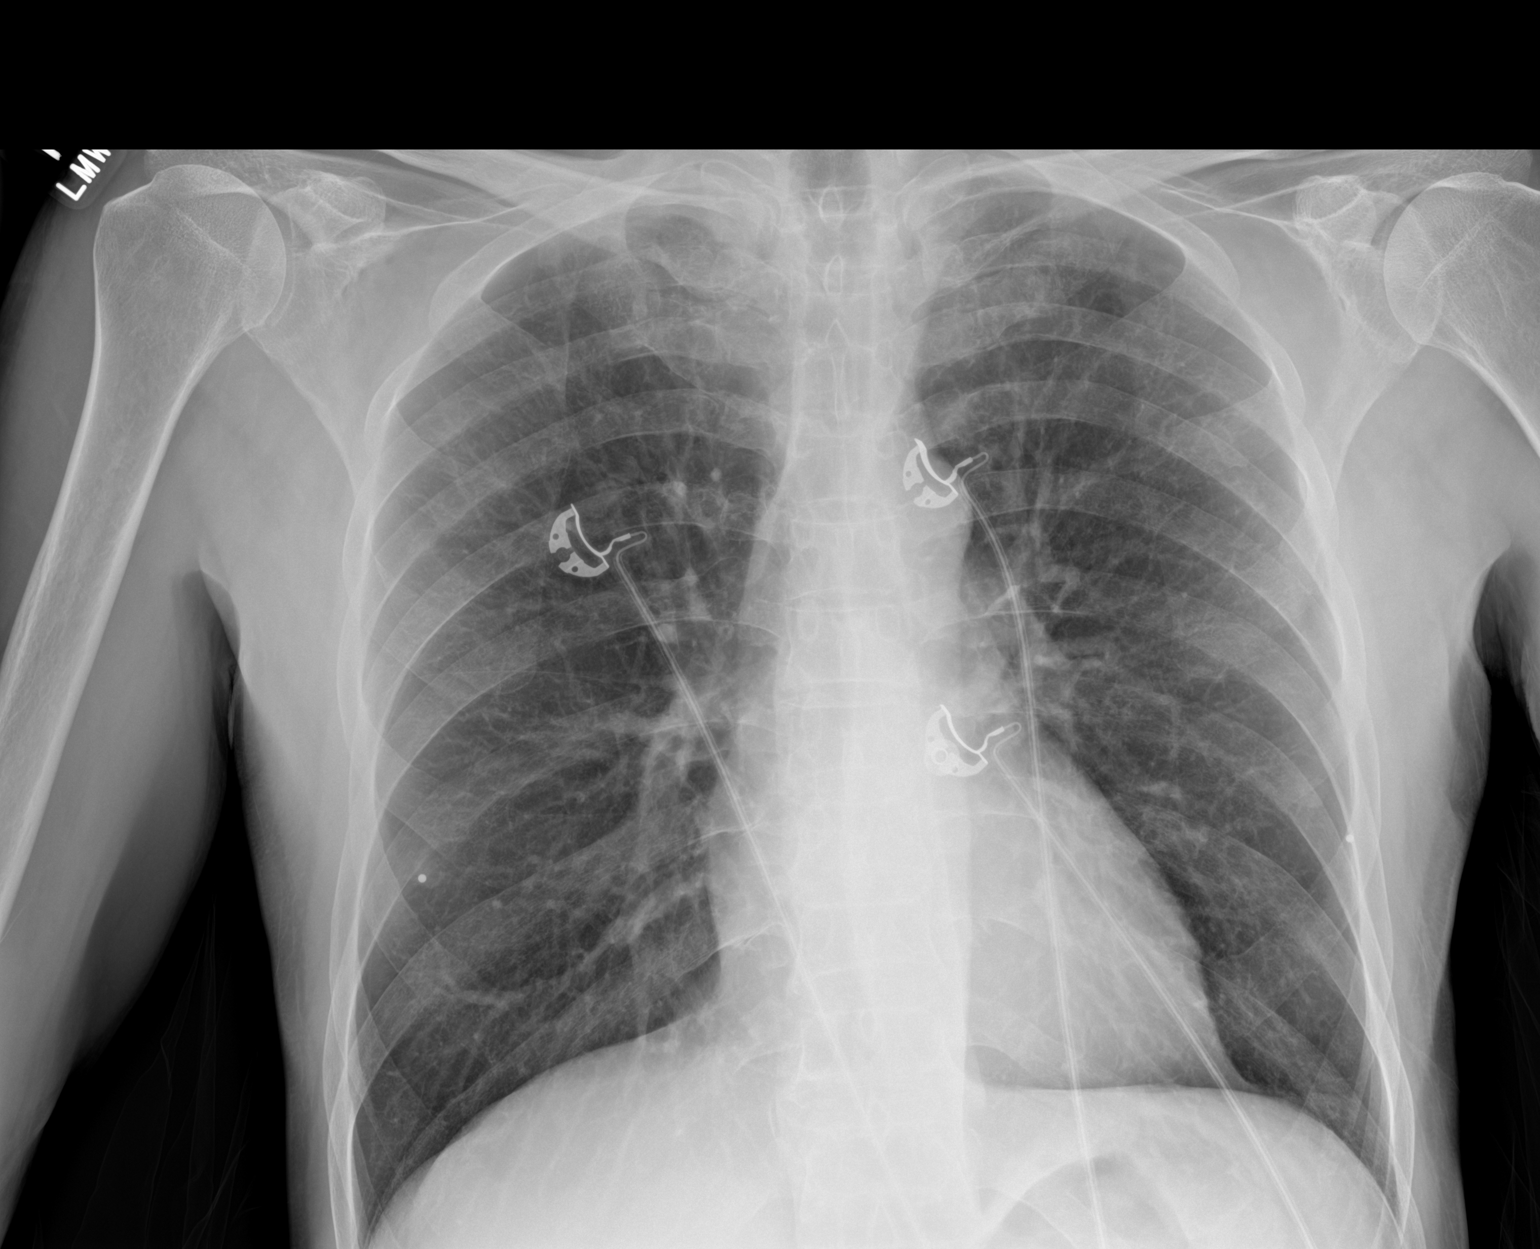

[1 of 1 positions shown; findings below may reference images not displayed]

FINDINGS: A trace amount biapical pleural/pulmonary scarring is again seen. A
small radiopaque nipple marker is seen overlying the 1 point cm
round density seen overlying the right lower lung on the prior
study. A lateral left nipple markers also noted. There is no
evidence of acute infiltrate, pleural effusion or pneumothorax. The
heart size and mediastinal contours are within normal limits. The
visualized skeletal structures are unremarkable.
IMPRESSION: 1. No acute cardiopulmonary disease.
2. Nipple marker overlying the round density overlying the right
lower lung zone on the prior study.

## 2021-05-19 MED ORDER — ONDANSETRON 4 MG PO TBDP
4.0000 mg | ORAL_TABLET | Freq: Once | ORAL | Status: AC
  Administered 2021-05-20: 4 mg via ORAL
  Filled 2021-05-19: qty 1

## 2021-05-19 MED ORDER — OXYCODONE HCL 5 MG/5ML PO SOLN
5.0000 mg | Freq: Once | ORAL | Status: AC
  Administered 2021-05-20: 5 mg via ORAL
  Filled 2021-05-19 (×2): qty 5

## 2021-05-19 MED ORDER — ACETAMINOPHEN 160 MG/5ML PO SOLN
650.0000 mg | Freq: Once | ORAL | Status: AC
  Administered 2021-05-20: 650 mg via ORAL
  Filled 2021-05-19: qty 20.3

## 2021-05-19 MED ORDER — ACETAMINOPHEN 500 MG PO TABS: 1000.0000 mg | ORAL_TABLET | Freq: Once | ORAL | Status: DC

## 2021-05-19 NOTE — ED Triage Notes (Signed)
Pt comes into the ED via [facility] from home c/o left side chest pain that is increased with palpation and motion.  Pt received COVID shot yesterday.  Pt states the pain started this morning.  H/o ALS, asthma, and enlarged pancreas  CBG 96 97.5 EKG unremarkable.

## 2021-05-19 NOTE — ED Provider Notes (Signed)
[facility] Emergency Department Provider Note  ____________________________________________   Event Date/Time   First MD Initiated Contact with Patient 05/19/21 2257     (approximate)  I have reviewed the triage vital signs and the nursing notes.   HISTORY  Chief Complaint Chest Pain    HPI Shawn [name] is a 60 y.o. male with history of ALS, kidney stones, CVA who presents to the emergency department with complaints of back pain.  States he has a known kidney stone that was found at [facility] and he has surgery scheduled next week at [facility].  He states he is taking oxycodone at home.  He has seen blood in his urine.  He also reports he is having chest soreness, headache, feels stiff all over with subjective fevers, nausea and vomiting that started today.  Reports he received his COVID booster yesterday.  Denies any shortness of breath, cough, abdominal pain, diarrhea, dysuria.  Chest pain worse with palpation and movement.       Past Medical History:  Diagnosis Date   ALS (amyotrophic lateral sclerosis) (HCC)    Asthma    Coronary artery disease    Hyperlipidemia    Renal stone    Stroke [facility])     Patient Active Problem List   Diagnosis Date Noted   Ureteral stone 05/20/2015    Past Surgical History:  Procedure Laterality Date   CARDIAC CATHETERIZATION  2006   negative   CYSTOSCOPY W/ URETERAL STENT REMOVAL Left 06/11/2015   Procedure: CYSTOSCOPY WITH STENT EXCHANGE;  Surgeon: Malen Gauze, MD;  Location: [facility] ORS;  Service: Urology;  Laterality: Left;   CYSTOSCOPY WITH STENT PLACEMENT Left 05/20/2015   Procedure: CYSTOSCOPY WITH STENT PLACEMENT;  Surgeon: Crist Fat, MD;  Location: [facility] OR[facility];  Service: Urology;  Laterality: Left;   CYSTOSCOPY/RETROGRADE/URETEROSCOPY/STONE EXTRACTION WITH BASKET  2000   pt. unsure of which side done   CYSTOSCOPY/URETEROSCOPY/HOLMIUM LASER Left 06/11/2015   Procedure: CYSTOSCOPY/URETEROSCOPY/HOLMIUM  LASER;  Surgeon: Malen Gauze, MD;  Location: [facility] ORS;  Service: Urology;  Laterality: Left;   KNEE SURGERY Right 84,96, 2003   x3   SHOULDER SURGERY Left 2010    Prior to Admission medications   Medication Sig Start Date End Date Taking? Authorizing Provider  albuterol (PROVENTIL HFA;VENTOLIN HFA) 108 (90 BASE) MCG/ACT inhaler Inhale 1-2 puffs into the lungs as needed for wheezing or shortness of breath.    [provider]  atorvastatin (LIPITOR) 20 MG tablet Take 20 mg by mouth daily. 06/25/20   [provider]  Baclofen 5 MG TABS TAKE 2 TABLETS (10 MG TOTAL) BY MOUTH 3 (THREE) TIMES DAILY 12/20/20   [provider]  gabapentin (NEURONTIN) 250 MG/5ML solution TAKE 5 ML EVERY 8 HOURS FOR PAIN 11/19/20   [provider]  montelukast (SINGULAIR) 10 MG tablet Take 1 tablet by mouth at bedtime. 12/21/20   [provider]  riluzole (RILUTEK) 50 MG tablet TAKE 1 TABLET BY MOUTH DAILY FOR 3 DAYS, THEN TAKE 1 TAB TWICE DAILY THEREAFTER 12/27/20   [provider]  traZODone (DESYREL) 50 MG tablet Take 50 mg by mouth at bedtime. 12/21/20   [provider]    Allergies Aspirin, Morphine and related, Naproxen, Penicillins, and Tramadol  Family History  Problem Relation Age of Onset   Cancer Mother    Heart failure Mother    Hypertension Mother    CVA Mother    Diabetes Other    Other Father  unknown medical history    Social History Social History   Tobacco Use   Smoking status: Never   Smokeless tobacco: Never  Vaping Use   Vaping Use: Never used  Substance Use Topics   Alcohol use: Not Currently    Alcohol/week: 0.0 standard drinks    Comment: occasionally   Drug use: Never    Review of Systems Constitutional: +fever. Eyes: No visual changes. ENT: No sore throat. Cardiovascular: +chest pain. Respiratory: Denies shortness of breath. Gastrointestinal: + nausea, vomiting.  No diarrhea. Genitourinary:  Negative for dysuria. Musculoskeletal: Negative for back pain. Skin: Negative for rash. Neurological: Negative for focal weakness or numbness.  ____________________________________________   PHYSICAL EXAM:  VITAL SIGNS: ED Triage Vitals  Enc Vitals Group     BP 05/19/21 1628 119/76     Pulse Rate 05/19/21 1628 65     Resp 05/19/21 1628 18     Temp 05/19/21 1628 99 F (37.2 C)     Temp Source 05/19/21 1628 Oral     SpO2 05/19/21 1628 95 %     Weight 05/19/21 1623 130 lb (59 kg)     Height 05/19/21 1623 5\' 10"  (1.778 m)     Head Circumference --      Peak Flow --      Pain Score 05/19/21 2257 6     Pain Loc --      Pain Edu? --      Excl. in GC? --    CONSTITUTIONAL: Alert and oriented and responds appropriately to questions.  Chronically ill-appearing, thin HEAD: Normocephalic EYES: Conjunctivae clear, pupils appear equal, EOM appear intact ENT: normal nose; moist mucous membranes NECK: Supple, normal ROM, no meningismus CARD: RRR; S1 and S2 appreciated; no murmurs, no clicks, no rubs, no gallops RESP: Normal chest excursion without splinting or tachypnea; breath sounds clear and equal bilaterally; no wheezes, no rhonchi, no rales, no hypoxia or respiratory distress, speaking full sentences ABD/GI: Normal bowel sounds; non-distended; soft, non-tender, no rebound, no guarding, no peritoneal signs, no hepatosplenomegaly BACK: The back appears normal, no midline spinal tenderness or step-off or deformity, no redness or warmth, no rashes or other lesions, no ecchymosis or soft tissue swelling, no CVA tenderness EXT: Normal ROM in all joints; no deformity noted, no edema; no cyanosis SKIN: Normal color for age and race; warm; no rash on exposed skin NEURO: Moves all extremities equally, slow to answer questions, normal speech, no facial asymmetry PSYCH: The patient's mood and manner are appropriate.  ____________________________________________   LABS (all labs ordered are  listed, but only abnormal results are displayed)  Labs Reviewed  BASIC METABOLIC PANEL - Abnormal; Notable for the following components:      Result Value   Glucose, Bld 101 (*)    All other components within normal limits  CBC - Abnormal; Notable for the following components:   RBC 3.85 (*)    HCT 38.5 (*)    MCH 34.8 (*)    All other components within normal limits  URINALYSIS, COMPLETE (UACMP) WITH MICROSCOPIC - Abnormal; Notable for the following components:   Color, Urine YELLOW (*)    APPearance CLEAR (*)    Ketones, ur 80 (*)    All other components within normal limits  URINE CULTURE  TROPONIN I (HIGH SENSITIVITY)  TROPONIN I (HIGH SENSITIVITY)   ____________________________________________  EKG   EKG Interpretation  Date/Time:  Thursday May 19 2021 16:25:17 EDT Ventricular Rate:  62 PR Interval:  118 QRS Duration: 70  QT Interval:  382 QTC Calculation: 387 R Axis:   14 Text Interpretation: Normal sinus rhythm Inferior infarct , age undetermined Abnormal ECG No significant change since last tracing Confirmed by Rochele RaringWard, Montae Stager [id number]) on 05/19/2021 11:02:19 PM        ____________________________________________  RADIOLOGY Normajean BaxterI, Jireh Elmore, personally viewed and evaluated these images (plain radiographs) as part of my medical decision making, as well as reviewing the written report by the radiologist.  ED MD interpretation: Chest x-ray shows no acute abnormality.  Official radiology report(s): DG Chest 1 View  Result Date: 05/19/2021 CLINICAL DATA:  Repeat study with nipple marker. EXAM: CHEST  1 VIEW COMPARISON:  May 19, 2021 (6:08 p.m.) FINDINGS: A trace amount biapical pleural/pulmonary scarring is again seen. A small radiopaque nipple marker is seen overlying the 1 point cm round density seen overlying the right lower lung on the prior study. A lateral left nipple markers also noted. There is no evidence of acute infiltrate, pleural effusion or  pneumothorax. The heart size and mediastinal contours are within normal limits. The visualized skeletal structures are unremarkable. IMPRESSION: 1. No acute cardiopulmonary disease. 2. Nipple marker overlying the round density overlying the right lower lung zone on the prior study. Electronically Signed   By: Aram Candelahaddeus  Houston M.D.   On: 05/19/2021 23:26   DG Chest 2 View  Result Date: 05/19/2021 CLINICAL DATA:  Chest pain EXAM: CHEST - 2 VIEW COMPARISON:  Chest x-ray 10/18/2020, CT chest 10/18/2020 FINDINGS: The heart and mediastinal contours are within normal limits. Trace biapical pleural/pulmonary scarring. There is a 1cm round density overlying the right lower lung zone. No focal consolidation. No pulmonary edema. No pleural effusion. No pneumothorax. No acute osseous abnormality. IMPRESSION: 1. No active cardiopulmonary disease. 2. A 1cm round density overlying the right lower lung zone likely represents a nipple shadow. Recommend repeat frontal view with nipple marker. Electronically Signed   By: Tish FredericksonMorgane  Naveau M.D.   On: 05/19/2021 18:39    ____________________________________________   PROCEDURES  Procedure(s) performed (including Critical Care):  Procedures   ____________________________________________   INITIAL IMPRESSION / ASSESSMENT AND PLAN / ED COURSE  As part of my medical decision making, I reviewed the following data within the electronic MEDICAL RECORD NUMBER History obtained from family, Labs reviewed , EKG interpreted , Old EKG reviewed, Patient signed out to , Radiograph reviewed , Notes from prior ED visits, and [place] Controlled Substance Database         Patient here with complaints of continued flank pain after recent diagnosis of kidney stone.  Urine today shows no sign of infection.  He [facility]  Will provide with pain medication.  I do not feel he needs repeat imaging at this time.  Creatinine is normal.  He is also  complaining of headaches, body aches, feeling stiff all over, musculoskeletal chest pain, nausea and vomiting, subjective fevers after receiving his COVID-19 booster yesterday.  Well-appearing, nontoxic here.  Chest x-ray clear.  Labs reassuring.  Troponin x2 negative.  EKG nonischemic.  Doubt ACS, PE, dissection.  He does not appear septic.  Doubt meningitis, pneumonia, UTI, sepsis.  Will give Tylenol, Zofran and encourage oral fluids.  ED PROGRESS  Patient reports feeling better.  His COVID and flu swabs are negative.  He states he is out of his pain medication.  He has urology follow-up scheduled.  Will discharge with prescriptions of oxycodone and Zofran.  Discussed importance of Tylenol, rest and increase  fluid intake.  I feel he is safe for discharge.  He is comfortable with this plan.   At this time, I do not feel there is any life-threatening condition present. I have reviewed, interpreted and discussed all results (EKG, imaging, lab, urine as appropriate) and exam findings with patient/family. I have reviewed nursing notes and appropriate previous records.  I feel the patient is safe to be discharged home without further emergent workup and can continue workup as an outpatient as needed. Discussed usual and customary return precautions. Patient/family verbalize understanding and are comfortable with this plan.  Outpatient follow-up has been provided as needed. All questions have been answered.  ____________________________________________   FINAL CLINICAL IMPRESSION(S) / ED DIAGNOSES  Final diagnoses:  None     ED Discharge Orders     None       *Please note:  Shawn [name] was evaluated in Emergency Department on 05/19/2021 for the symptoms described in the history of present illness. He was evaluated in the context of the global COVID-19 pandemic, which necessitated consideration that the patient might be at risk for infection with the SARS-CoV-2 virus that causes COVID-19.  Institutional protocols and algorithms that pertain to the evaluation of patients at risk for COVID-19 are in a state of rapid change based on information released by regulatory bodies including the CDC and federal and state organizations. These policies and algorithms were followed during the patient's care in the ED.  Some ED evaluations and interventions may be delayed as a result of limited staffing during and the pandemic.*   Note:  This document was prepared using Dragon voice recognition software and may include unintentional dictation errors.    Bernadette Armijo, Layla Maw, DO 05/20/21 0129

## 2021-05-19 NOTE — ED Notes (Signed)
ED Provider at bedside. 

## 2021-05-19 NOTE — ED Notes (Signed)
Xray at bedside. Patient able to consume 30oz of water.

## 2021-05-19 NOTE — ED Notes (Signed)
Patient provided urine specimen container, and water to encourage patient to provide urine sample. Patient verbalized understanding. Family at bedside aware as well.

## 2021-05-20 DIAGNOSIS — M549 Dorsalgia, unspecified: Secondary | ICD-10-CM | POA: Diagnosis not present

## 2021-05-20 LAB — RESP PANEL BY RT-PCR (FLU A&B, COVID) ARPGX2
Influenza A by PCR: NEGATIVE
Influenza B by PCR: NEGATIVE
SARS Coronavirus 2 by RT PCR: NEGATIVE

## 2021-05-20 MED ORDER — OXYCODONE HCL 5 MG/5ML PO SOLN: 5.0000 mg | ORAL | 0 refills | Status: DC | PRN

## 2021-05-20 MED ORDER — ONDANSETRON 4 MG PO TBDP: 4.0000 mg | ORAL_TABLET | Freq: Four times a day (QID) | ORAL | 0 refills | Status: DC | PRN

## 2021-05-20 NOTE — Discharge Instructions (Addendum)
Your work-up today was reassuring.  I suspect many of your symptoms today are due to a reaction that is normal after your COVID-19 booster shot.  You may take Tylenol 650 mg every 6 hours as needed for fever and pain.  I recommend that you increase your fluid intake over the next several days.  Please follow-up with your urologist as scheduled.   You are being provided a prescription for opiates (also known as narcotics) for pain control.  Opiates can be addictive and should only be used when absolutely necessary for pain control when other alternatives do not work.  We recommend you only use them for the recommended amount of time and only as prescribed.  Please do not take with other sedative medications or alcohol.  Please do not drive, operate machinery, make important decisions while taking opiates.  Please note that these medications can be addictive and have high abuse potential.  Patients can become addicted to narcotics after only taking them for a few days.  Please keep these medications locked away from children, teenagers or any family members with history of substance abuse.  Narcotic pain medicine may also make you constipated.  You may use over-the-counter medications such as MiraLAX, Colace to prevent constipation.  If you become constipated you may use over-the-counter enemas as needed.  Itching and nausea are common side effects of narcotic pain medication.  If you develop uncontrolled vomiting or a rash, please stop these medications.

## 2021-05-20 NOTE — ED Notes (Signed)
Discharge papers provided to patient's wife. Patient assisted to wheelchair, taken to POV by Corrie Dandy, ED NT.

## 2021-05-20 NOTE — ED Notes (Signed)
COVID swab sent to lab.

## 2021-05-20 NOTE — ED Notes (Signed)
Patient given 2 cups of water to drink

## 2021-05-21 LAB — URINE CULTURE: Culture: NO GROWTH

## 2021-11-07 ENCOUNTER — Inpatient Hospital Stay: Payer: Medicare Other

## 2021-11-07 ENCOUNTER — Emergency Department: Payer: Medicare Other

## 2021-11-07 ENCOUNTER — Encounter: Payer: Self-pay | Admitting: Intensive Care

## 2021-11-07 ENCOUNTER — Inpatient Hospital Stay
Admission: EM | Admit: 2021-11-07 | Discharge: 2021-11-09 | DRG: 552 | Disposition: A | Discharge status: Skilled Nursing Facility | Payer: Medicare Other | Attending: Internal Medicine | Admitting: Internal Medicine

## 2021-11-07 ENCOUNTER — Other Ambulatory Visit: Payer: Self-pay

## 2021-11-07 DIAGNOSIS — Z885 Allergy status to narcotic agent status: Secondary | ICD-10-CM

## 2021-11-07 DIAGNOSIS — Z79899 Other long term (current) drug therapy: Secondary | ICD-10-CM

## 2021-11-07 DIAGNOSIS — D638 Anemia in other chronic diseases classified elsewhere: Secondary | ICD-10-CM | POA: Diagnosis present

## 2021-11-07 DIAGNOSIS — J45909 Unspecified asthma, uncomplicated: Secondary | ICD-10-CM | POA: Diagnosis present

## 2021-11-07 DIAGNOSIS — Z8249 Family history of ischemic heart disease and other diseases of the circulatory system: Secondary | ICD-10-CM | POA: Diagnosis not present

## 2021-11-07 DIAGNOSIS — U071 COVID-19: Secondary | ICD-10-CM | POA: Diagnosis not present

## 2021-11-07 DIAGNOSIS — R1312 Dysphagia, oropharyngeal phase: Secondary | ICD-10-CM | POA: Diagnosis present

## 2021-11-07 DIAGNOSIS — Z886 Allergy status to analgesic agent status: Secondary | ICD-10-CM

## 2021-11-07 DIAGNOSIS — J9601 Acute respiratory failure with hypoxia: Secondary | ICD-10-CM | POA: Diagnosis not present

## 2021-11-07 DIAGNOSIS — M79661 Pain in right lower leg: Secondary | ICD-10-CM | POA: Diagnosis present

## 2021-11-07 DIAGNOSIS — G4700 Insomnia, unspecified: Secondary | ICD-10-CM | POA: Diagnosis present

## 2021-11-07 DIAGNOSIS — S32029A Unspecified fracture of second lumbar vertebra, initial encounter for closed fracture: Principal | ICD-10-CM | POA: Diagnosis present

## 2021-11-07 DIAGNOSIS — R471 Dysarthria and anarthria: Secondary | ICD-10-CM | POA: Diagnosis present

## 2021-11-07 DIAGNOSIS — E440 Moderate protein-calorie malnutrition: Secondary | ICD-10-CM | POA: Diagnosis present

## 2021-11-07 DIAGNOSIS — G1221 Amyotrophic lateral sclerosis: Secondary | ICD-10-CM | POA: Diagnosis present

## 2021-11-07 DIAGNOSIS — I2510 Atherosclerotic heart disease of native coronary artery without angina pectoris: Secondary | ICD-10-CM | POA: Diagnosis present

## 2021-11-07 DIAGNOSIS — S32020A Wedge compression fracture of second lumbar vertebra, initial encounter for closed fracture: Secondary | ICD-10-CM | POA: Diagnosis present

## 2021-11-07 DIAGNOSIS — Z20822 Contact with and (suspected) exposure to covid-19: Secondary | ICD-10-CM | POA: Diagnosis present

## 2021-11-07 DIAGNOSIS — Z880 Allergy status to penicillin: Secondary | ICD-10-CM | POA: Diagnosis not present

## 2021-11-07 DIAGNOSIS — W010XXA Fall on same level from slipping, tripping and stumbling without subsequent striking against object, initial encounter: Secondary | ICD-10-CM | POA: Diagnosis present

## 2021-11-07 DIAGNOSIS — Z8673 Personal history of transient ischemic attack (TIA), and cerebral infarction without residual deficits: Secondary | ICD-10-CM

## 2021-11-07 DIAGNOSIS — Z823 Family history of stroke: Secondary | ICD-10-CM

## 2021-11-07 DIAGNOSIS — Y92009 Unspecified place in unspecified non-institutional (private) residence as the place of occurrence of the external cause: Secondary | ICD-10-CM

## 2021-11-07 DIAGNOSIS — E785 Hyperlipidemia, unspecified: Secondary | ICD-10-CM | POA: Diagnosis present

## 2021-11-07 DIAGNOSIS — Z681 Body mass index (BMI) 19 or less, adult: Secondary | ICD-10-CM | POA: Diagnosis not present

## 2021-11-07 DIAGNOSIS — M79662 Pain in left lower leg: Secondary | ICD-10-CM | POA: Diagnosis present

## 2021-11-07 DIAGNOSIS — Z888 Allergy status to other drugs, medicaments and biological substances status: Secondary | ICD-10-CM | POA: Diagnosis not present

## 2021-11-07 DIAGNOSIS — M545 Low back pain, unspecified: Secondary | ICD-10-CM

## 2021-11-07 LAB — CBC WITH DIFFERENTIAL/PLATELET
Abs Immature Granulocytes: 0.03 10*3/uL (ref 0.00–0.07)
Basophils Absolute: 0 10*3/uL (ref 0.0–0.1)
Basophils Relative: 0 %
Eosinophils Absolute: 0 10*3/uL (ref 0.0–0.5)
Eosinophils Relative: 0 %
HCT: 37.3 % — ABNORMAL LOW (ref 39.0–52.0)
Hemoglobin: 12.5 g/dL — ABNORMAL LOW (ref 13.0–17.0)
Immature Granulocytes: 0 %
Lymphocytes Relative: 24 %
Lymphs Abs: 1.8 10*3/uL (ref 0.7–4.0)
MCH: 33.8 pg (ref 26.0–34.0)
MCHC: 33.5 g/dL (ref 30.0–36.0)
MCV: 100.8 fL — ABNORMAL HIGH (ref 80.0–100.0)
Monocytes Absolute: 0.6 10*3/uL (ref 0.1–1.0)
Monocytes Relative: 8 %
Neutro Abs: 4.8 10*3/uL (ref 1.7–7.7)
Neutrophils Relative %: 68 %
Platelets: 223 10*3/uL (ref 150–400)
RBC: 3.7 MIL/uL — ABNORMAL LOW (ref 4.22–5.81)
RDW: 12.5 % (ref 11.5–15.5)
WBC: 7.3 10*3/uL (ref 4.0–10.5)
nRBC: 0 % (ref 0.0–0.2)

## 2021-11-07 LAB — COMPREHENSIVE METABOLIC PANEL
ALT: 25 U/L (ref 0–44)
AST: 23 U/L (ref 15–41)
Albumin: 4 g/dL (ref 3.5–5.0)
Alkaline Phosphatase: 87 U/L (ref 38–126)
Anion gap: 5 (ref 5–15)
BUN: 15 mg/dL (ref 8–23)
CO2: 25 mmol/L (ref 22–32)
Calcium: 9.2 mg/dL (ref 8.9–10.3)
Chloride: 106 mmol/L (ref 98–111)
Creatinine, Ser: 0.74 mg/dL (ref 0.61–1.24)
GFR, Estimated: >60 mL/min (ref >60)
Glucose, Bld: 106 mg/dL — ABNORMAL HIGH (ref 70–99)
Potassium: 3.6 mmol/L (ref 3.5–5.1)
Sodium: 136 mmol/L (ref 135–145)
Total Bilirubin: 0.8 mg/dL (ref 0.3–1.2)
Total Protein: 6.6 g/dL (ref 6.5–8.1)

## 2021-11-07 LAB — CK: Total CK: 79 U/L (ref 49–397)

## 2021-11-07 LAB — RESP PANEL BY RT-PCR (FLU A&B, COVID) ARPGX2
Influenza A by PCR: NEGATIVE
Influenza B by PCR: NEGATIVE
SARS Coronavirus 2 by RT PCR: NEGATIVE

## 2021-11-07 IMAGING — MR MR THORACIC SPINE WO/W CM
7 of 9 series · 32 of 48 positions shown · IV contrast (gadavist)
Comparison: CT [DATE]

CLINICAL DATA: Fall, back pain

EXAM:
MRI THORACIC WITHOUT AND WITH CONTRAST
TECHNIQUE: Multiplanar and multiecho pulse sequences of the thoracic spine were
obtained without and with intravenous contrast.
CONTRAST:  6mL GADAVIST GADOBUTROL 1 MMOL/ML IV SOLN

[Series 18: T1 · sagittal · 6.0mm · 1.41mm/px · 1 of 9 slices shown (1 of 2)]
[im 1/9]
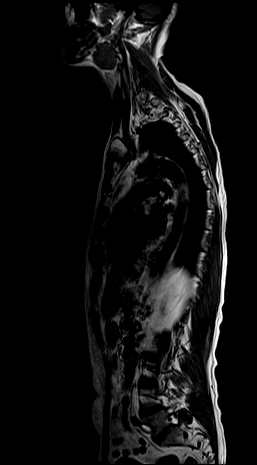

[Series 19: T2 · sagittal · 3.0mm · 1.33mm/px · 4 of 19 slices shown (1 of 2)]
[im 1/19]
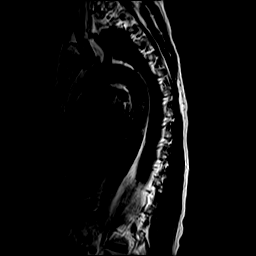
[im 7/19]
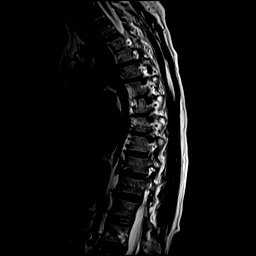
[im 13/19]
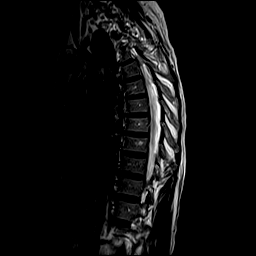
[im 19/19]
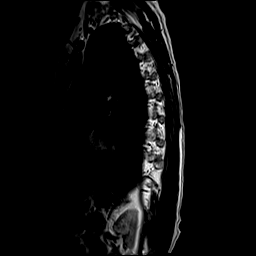

[Series 20: T1 · sagittal · 3.0mm · 1.33mm/px · 4 of 19 slices shown (2 of 2)]
[im 1/19]
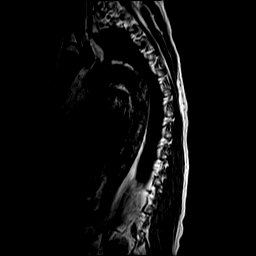
[im 7/19]
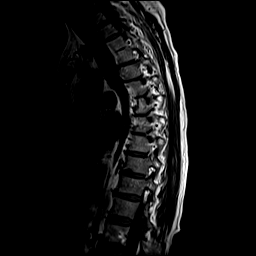
[im 13/19]
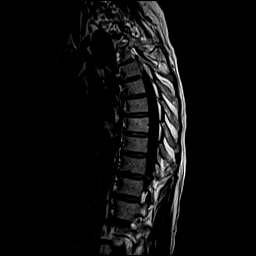
[im 19/19]
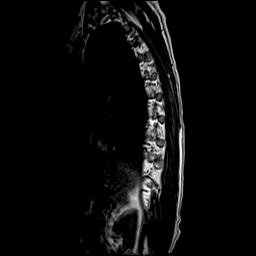

[Series 21: STIR · sagittal · 3.0mm · 0.66mm/px · 4 of 19 slices shown]
[im 1/19]
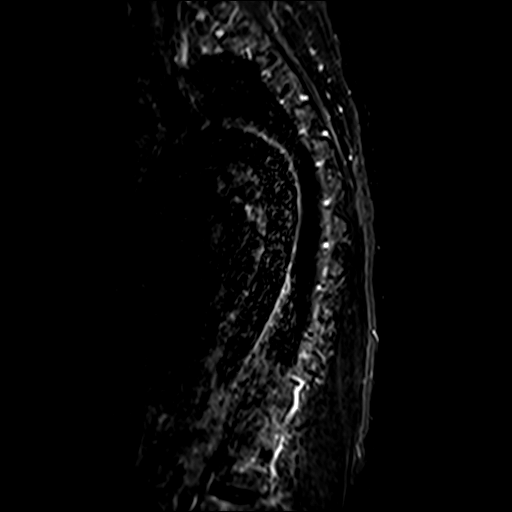
[im 7/19]
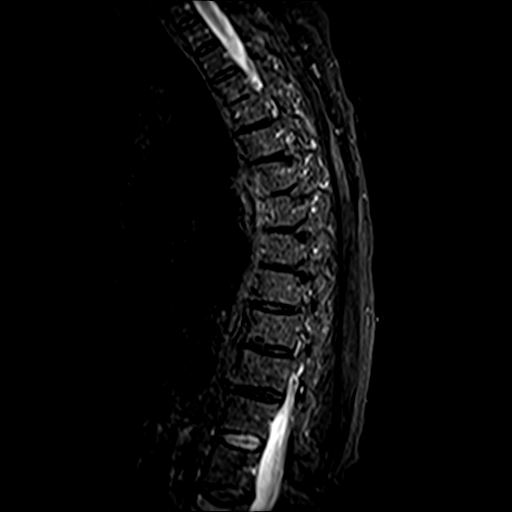
[im 13/19]
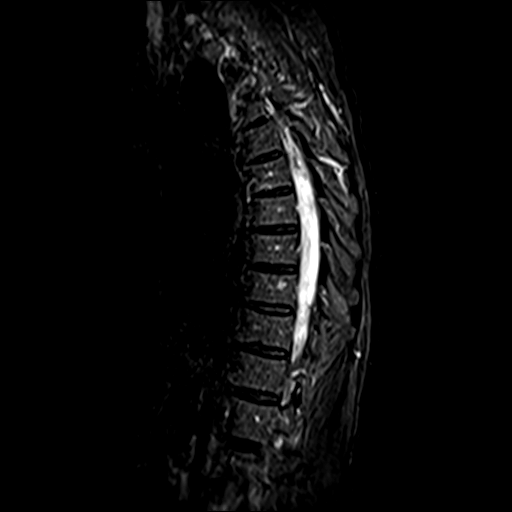
[im 19/19]
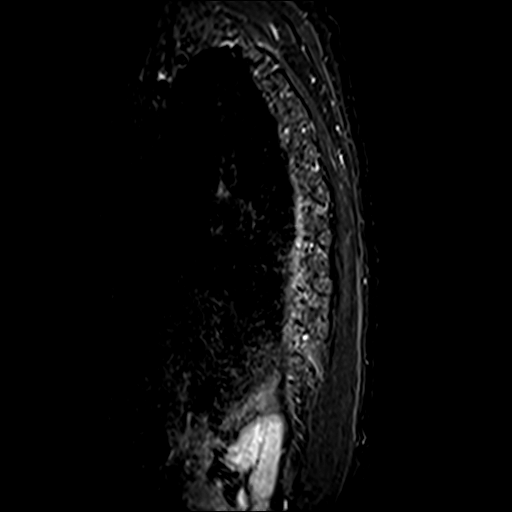

[Series 22: T2 · axial · 4.0mm · 0.59mm/px · z∈[-383,-138]mm · 8 of 39 slices shown (2 of 2)]
[im 1/39]
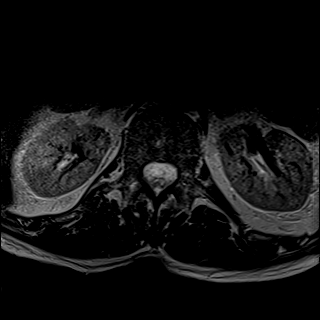
[im 6/39]
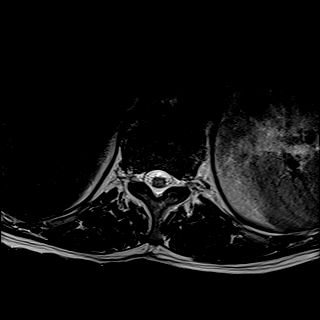
[im 11/39]
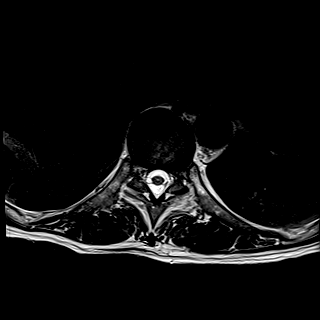
[im 17/39]
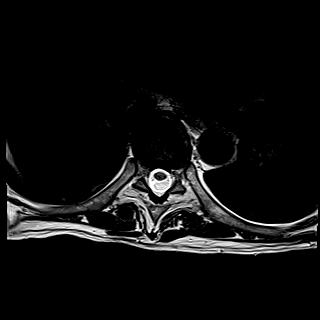
[im 22/39]
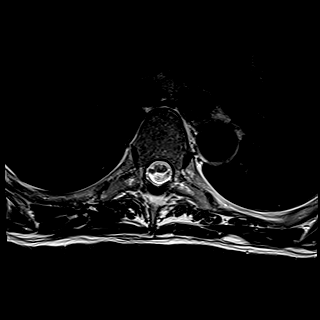
[im 28/39]
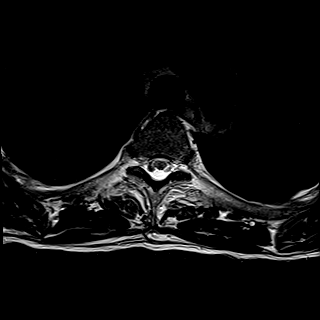
[im 33/39]
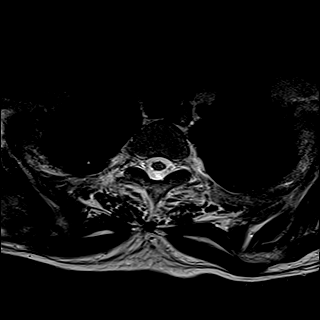
[im 39/39]
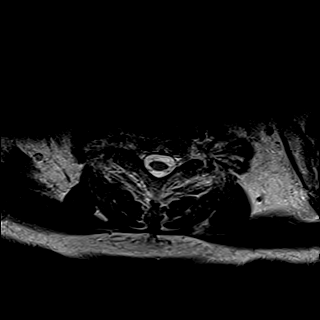

[Series 24: T1 post-contrast · axial · non-contrast · 4.0mm · 0.37mm/px · z∈[-383,-138]mm · 8 of 39 slices shown]
[im 1/39]
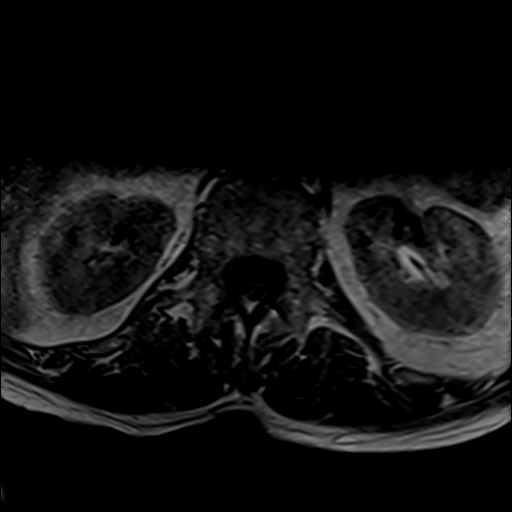
[im 6/39]
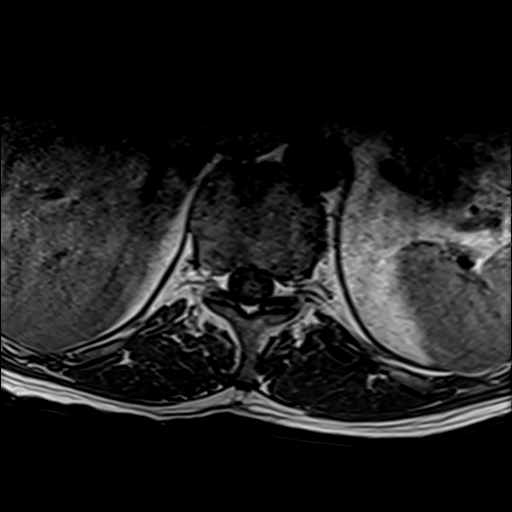
[im 11/39]
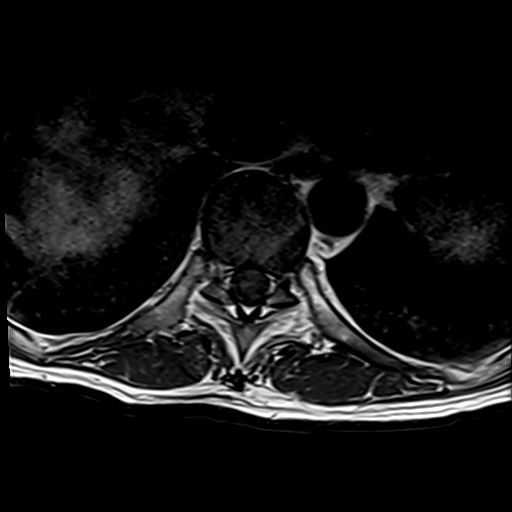
[im 17/39]
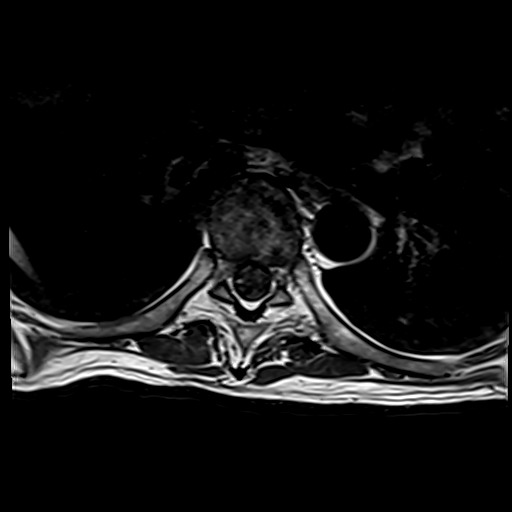
[im 22/39]
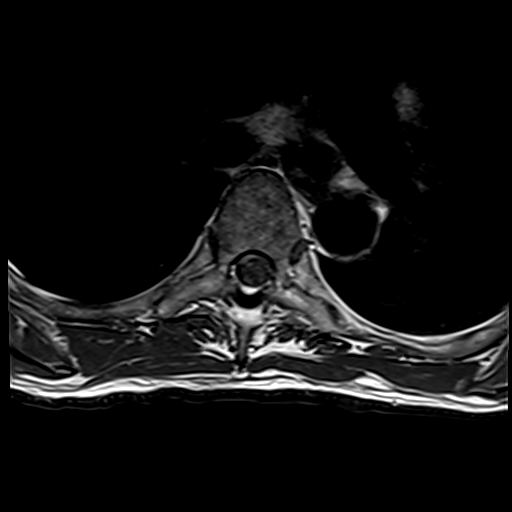
[im 28/39]
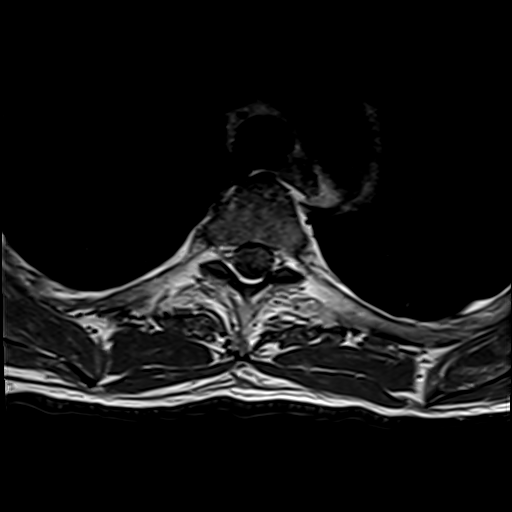
[im 33/39]
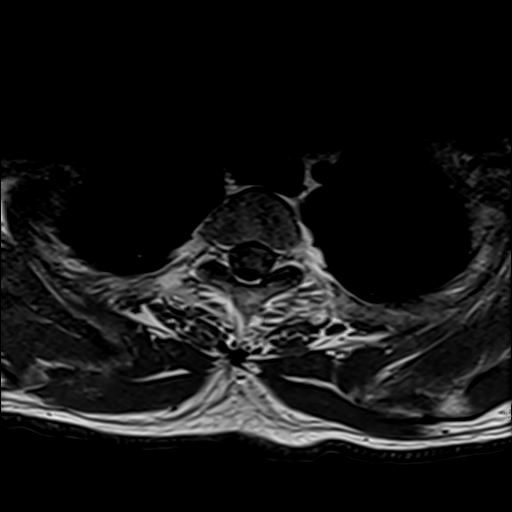
[im 39/39]
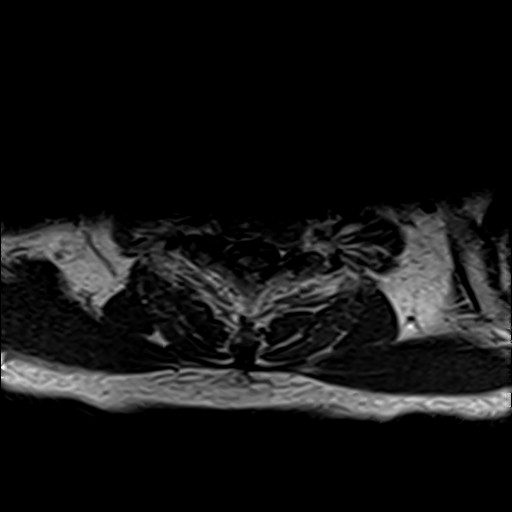

[Series 26: T1 fat-sat post-contrast · sagittal · 3.0mm · 1.33mm/px · 3 of 17 slices shown]
[im 1/17]
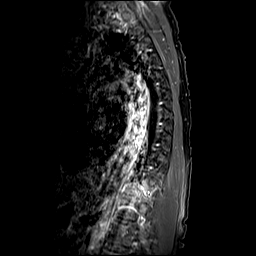
[im 9/17]
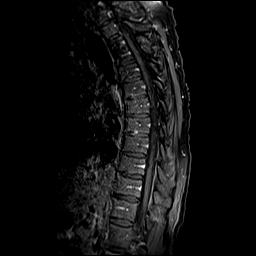
[im 17/17]
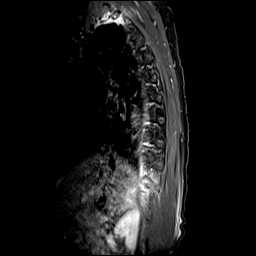

[32 of 48 positions shown; findings below may reference images not displayed]

FINDINGS: Alignment:  Physiologic.

Vertebrae: No fracture, evidence of discitis, or bone lesion. No
abnormal enhancement on postcontrast sequences.

Cord:  Normal signal and morphology.

Paraspinal and other soft tissues: Negative.

Disc levels:

Negative. Intervertebral discs of the thoracic spine are preserved
without disc height loss or focal disc protrusion. Facet joints
within normal limits. No foraminal or canal stenosis at any level
within the thoracic spine.
IMPRESSION: Unremarkable pre and post-contrast MRI of the thoracic spine.

## 2021-11-07 IMAGING — MR MR LUMBAR SPINE WO/W CM
6 of 7 series · 34 of 48 positions shown · IV contrast (6ml Gadavist)
Comparison: CT [DATE]

CLINICAL DATA: Low back pain after fall

EXAM:
MRI LUMBAR SPINE WITHOUT AND WITH CONTRAST
TECHNIQUE: Multiplanar and multiecho pulse sequences of the lumbar spine were
obtained without and with intravenous contrast.
CONTRAST:  6mL GADAVIST GADOBUTROL 1 MMOL/ML IV SOLN

[Series 5: T2 · sagittal · 4.0mm · 1.02mm/px · 4 of 16 slices shown (1 of 2)]
[im 1/16]
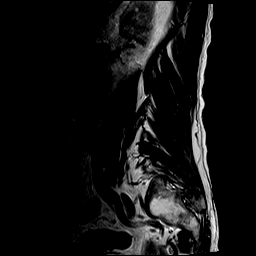
[im 6/16]
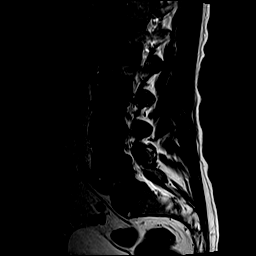
[im 11/16]
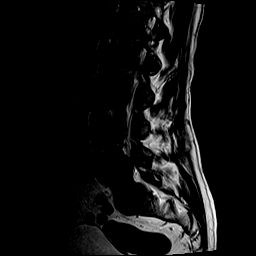
[im 16/16]
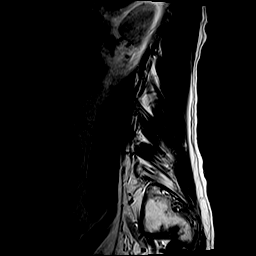

[Series 6: T1 · sagittal · 4.0mm · 1.02mm/px · 4 of 16 slices shown (1 of 2)]
[im 1/16]
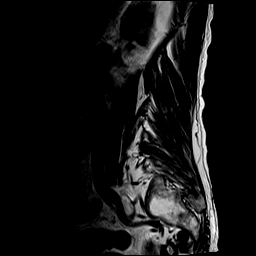
[im 6/16]
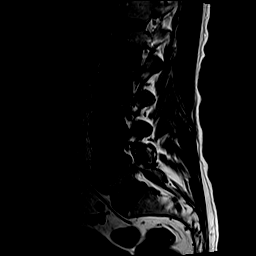
[im 11/16]
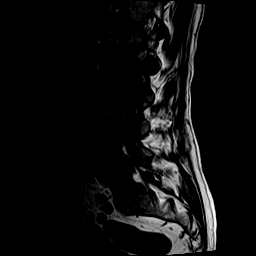
[im 16/16]
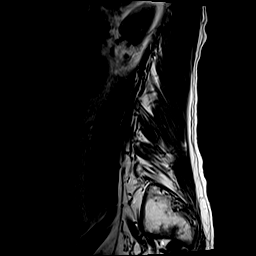

[Series 7: STIR · sagittal · 4.0mm · 0.51mm/px · 5 of 16 slices shown]
[im 1/16]
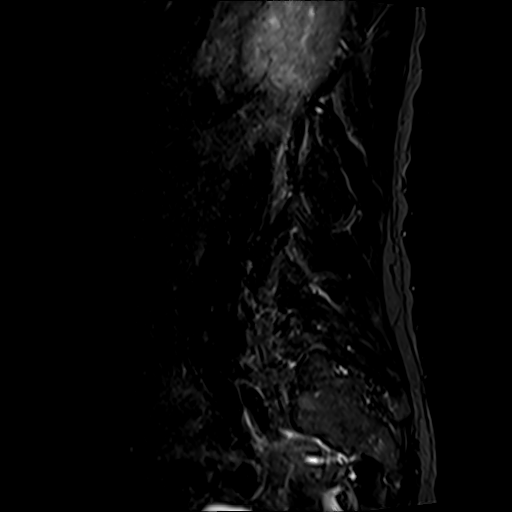
[im 4/16]
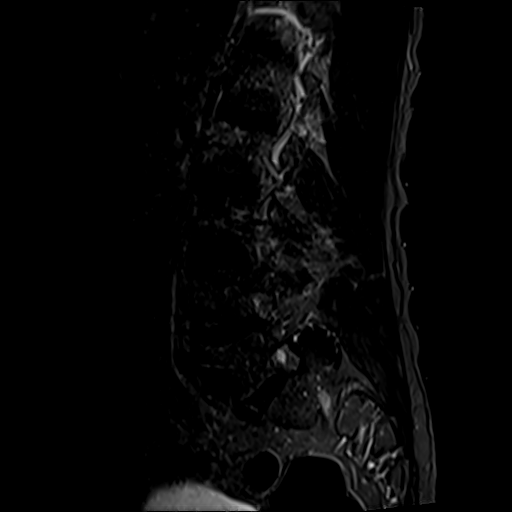
[im 8/16]
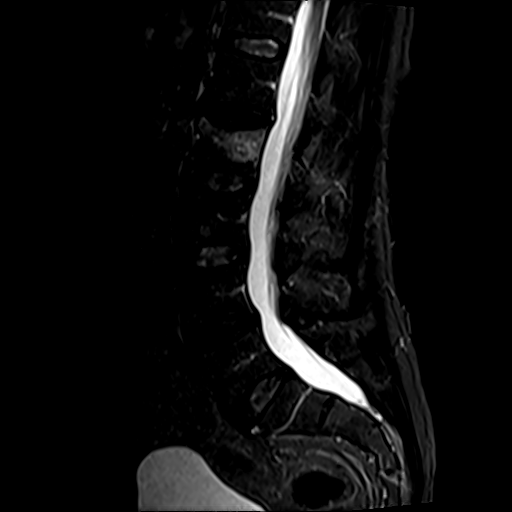
[im 12/16]
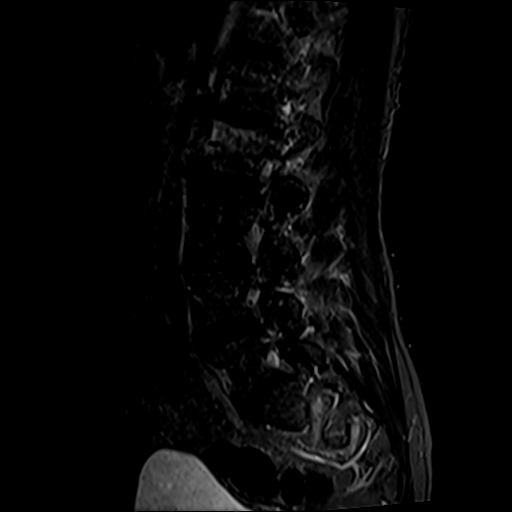
[im 16/16]
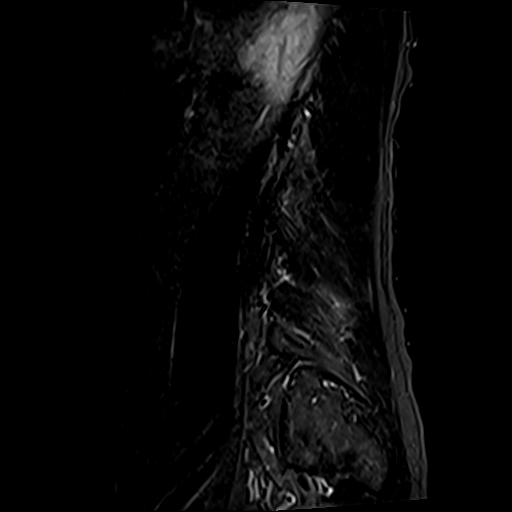

[Series 8: T2 · axial · 4.0mm · 0.78mm/px · z∈[-592,-411]mm · 8 of 32 slices shown (2 of 2)]
[im 1/32]
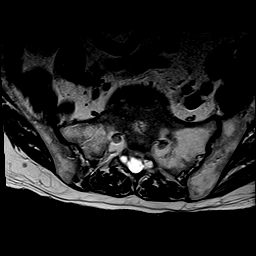
[im 4/32]
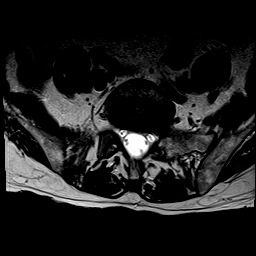
[im 11/32]
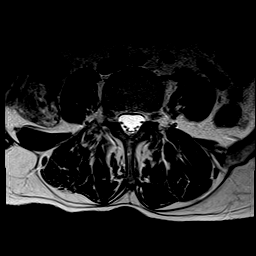
[im 14/32]
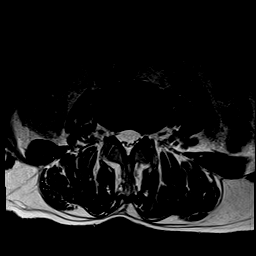
[im 18/32]
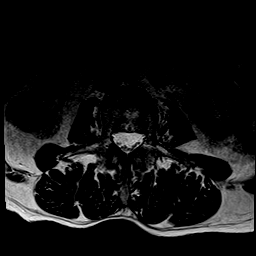
[im 21/32]
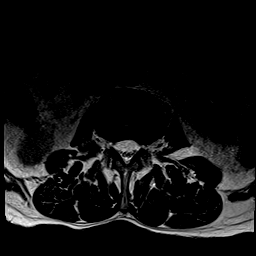
[im 28/32]
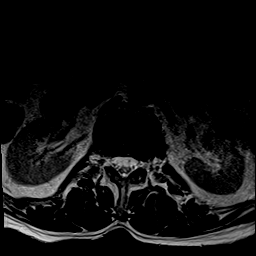
[im 32/32]
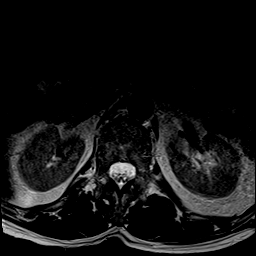

[Series 9: T1 · axial · 4.0mm · 0.39mm/px · z∈[-592,-411]mm · 8 of 32 slices shown (2 of 2)]
[im 1/32]
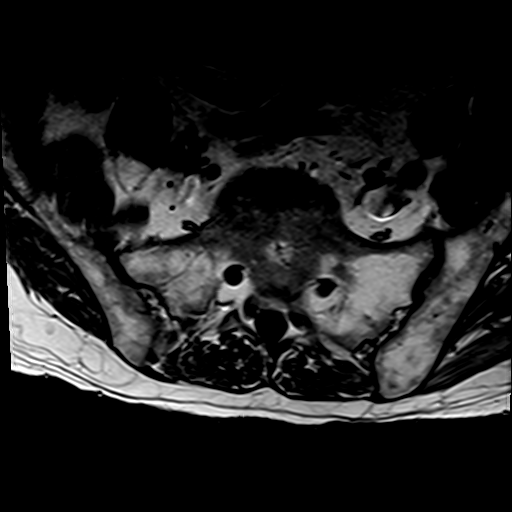
[im 4/32]
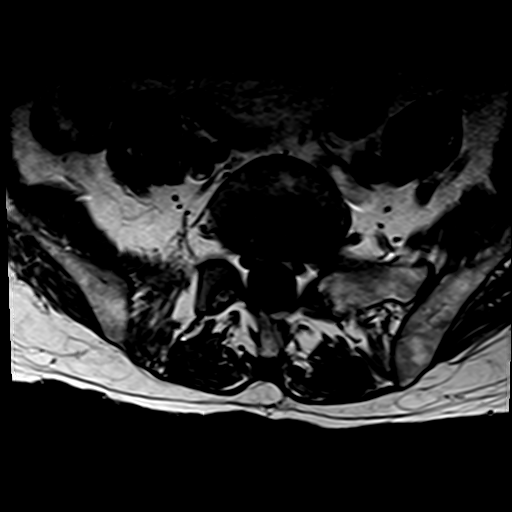
[im 11/32]
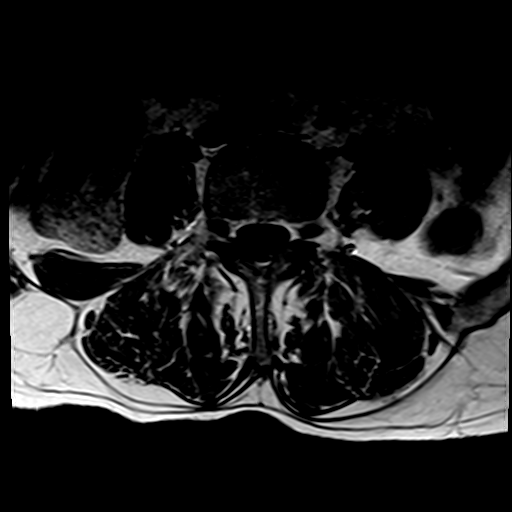
[im 14/32]
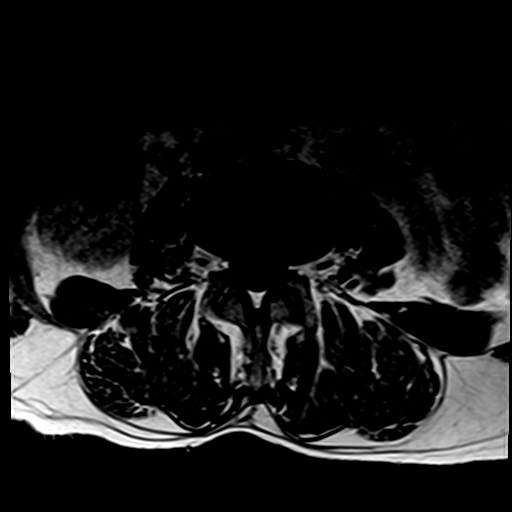
[im 18/32]
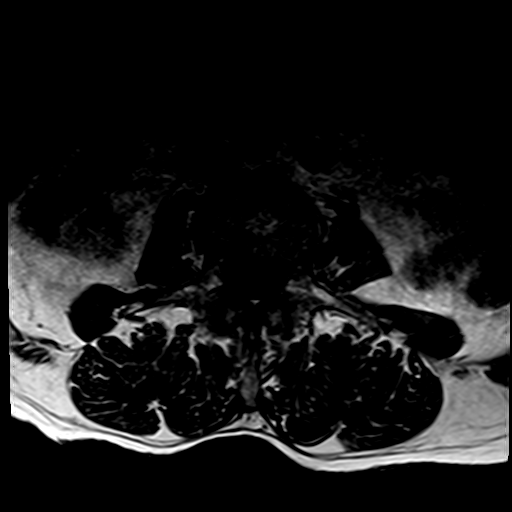
[im 21/32]
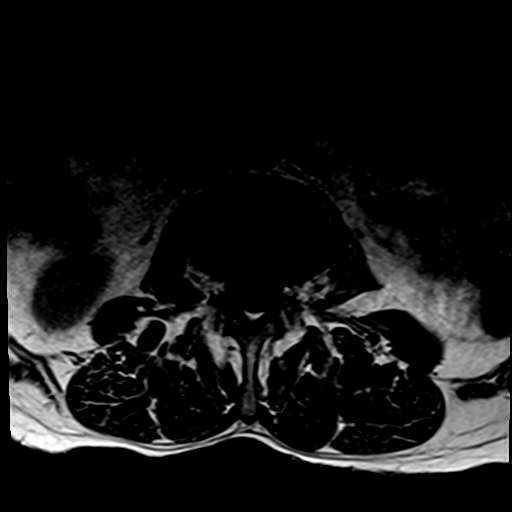
[im 28/32]
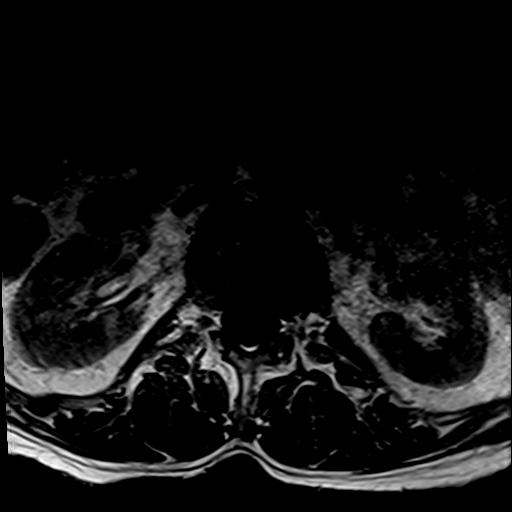
[im 32/32]
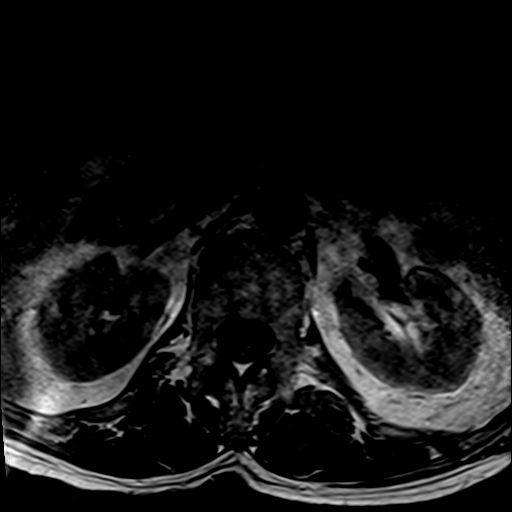

[Series 10: T1 fat-sat post-contrast · sagittal · 4.0mm · 1.02mm/px · 5 of 16 slices shown]
[im 1/16]
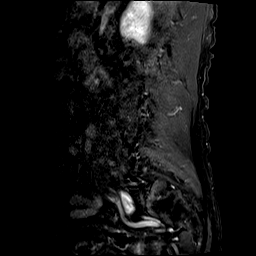
[im 4/16]
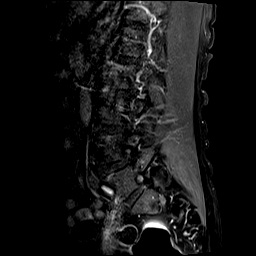
[im 8/16]
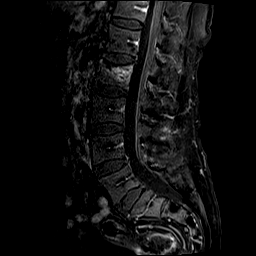
[im 12/16]
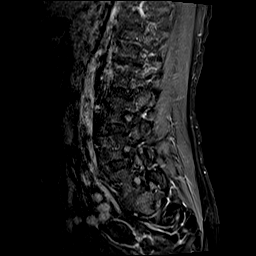
[im 16/16]
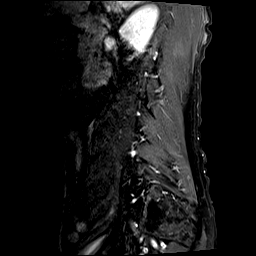

[34 of 48 positions shown; findings below may reference images not displayed]

FINDINGS: Segmentation:  Standard.

Alignment:  Physiologic.

Vertebrae: Acute mild superior endplate compression fracture of L2
with approximately 10% vertebral body height loss. No retropulsion.
No additional fractures. No evidence of discitis. No suspicious bone
lesion. No abnormal enhancement on postcontrast sequences.

Conus medullaris and cauda equina: Conus extends to the T12-L1
level. Conus and cauda equina appear normal.

Paraspinal and other soft tissues: Negative.

Disc levels:

T12-L1 through L3-L4: Unremarkable.

L4-L5: Mild annular disc bulge and mild bilateral facet hypertrophy.
Borderline-mild left foraminal stenosis. No canal stenosis.

L5-S1: No disc protrusion. Mild bilateral facet arthropathy. No
foraminal or canal stenosis.
IMPRESSION: 1. Acute mild superior endplate compression fracture of L2 with
approximately 10% vertebral body height loss. No retropulsion.
2. Mild lower lumbar spondylosis with borderline-mild left foraminal
stenosis at L4-5. No canal stenosis at any level.

## 2021-11-07 IMAGING — CT CT PELVIS W/O CM
2 of 6 series · 14 of 46 positions shown, 19 images · non-contrast
Comparison: [DATE]

CLINICAL DATA: Pelvic pain

EXAM:
CT PELVIS WITHOUT CONTRAST
TECHNIQUE: Multidetector CT imaging of the pelvis was performed following the
standard protocol without intravenous contrast.
RADIATION DOSE REDUCTION: This exam was performed according to the
departmental dose-optimization program which includes automated
exposure control, adjustment of the mA and/or kV according to
patient size and/or use of iterative reconstruction technique.

[Series 4: axial st · axial · 0.76mm/px · z∈[-782,-532]mm · 11 of 145 slices shown, 16 images]
[im 10/145  soft-tissue]
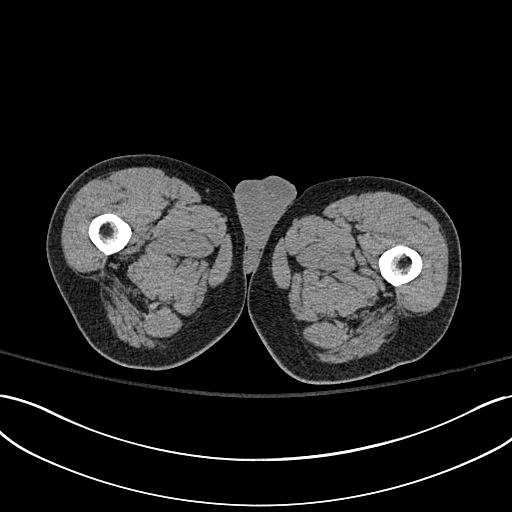
[im 10/145  bone]
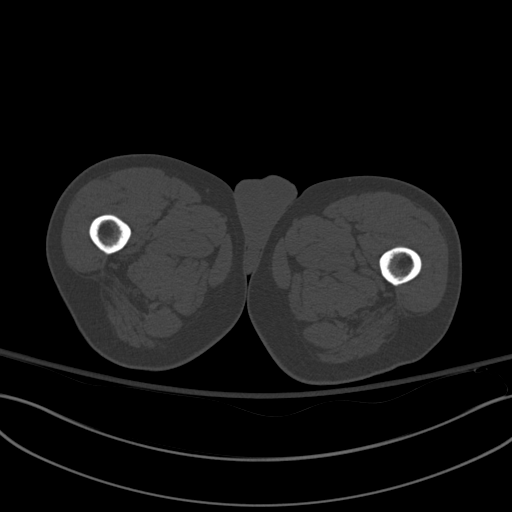
[im 29/145  soft-tissue]
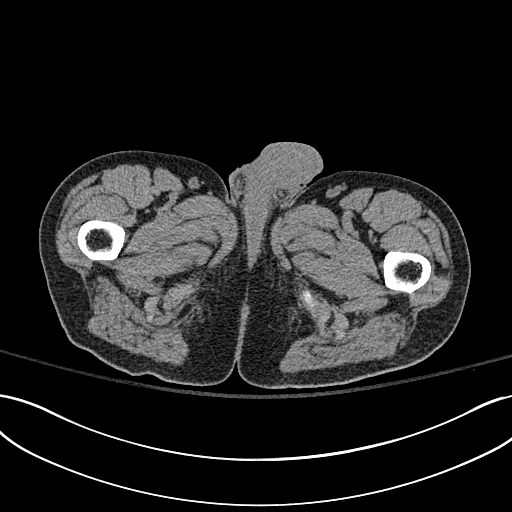
[im 39/145  soft-tissue]
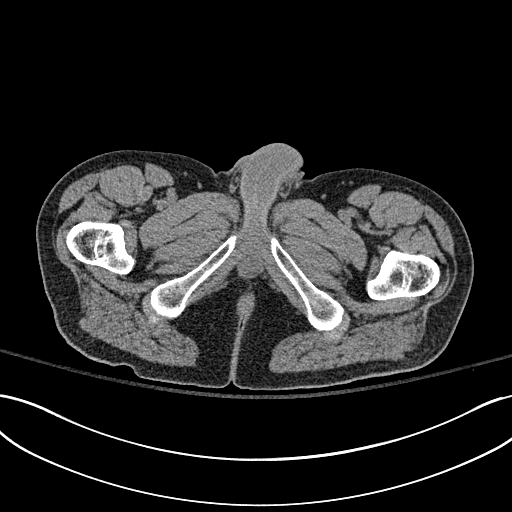
[im 49/145  soft-tissue]
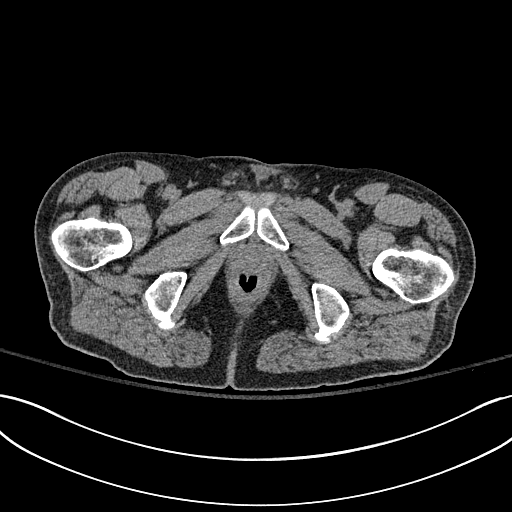
[im 68/145  soft-tissue]
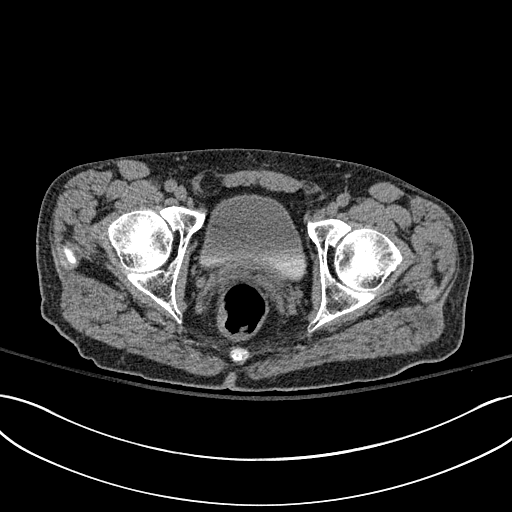
[im 77/145  soft-tissue]
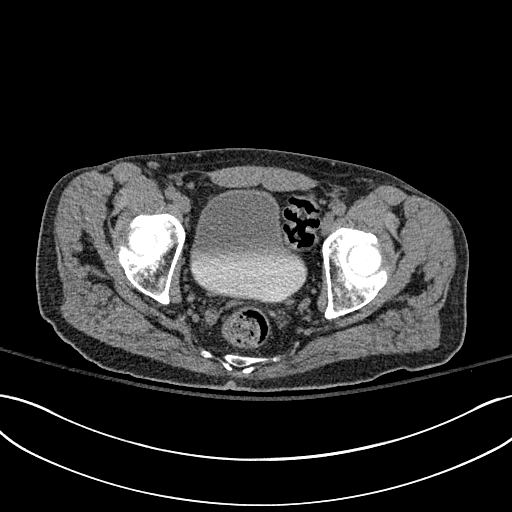
[im 97/145  soft-tissue]
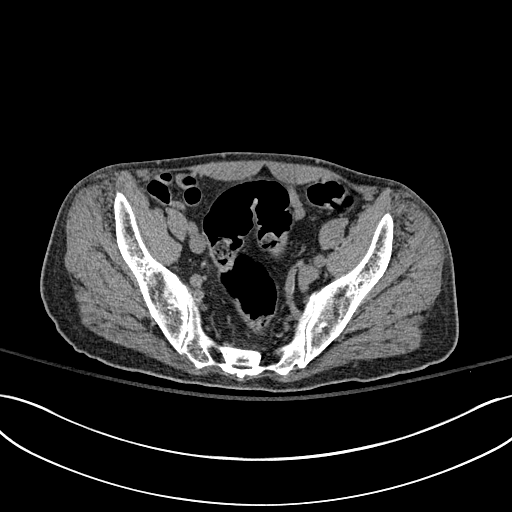
[im 106/145  soft-tissue]
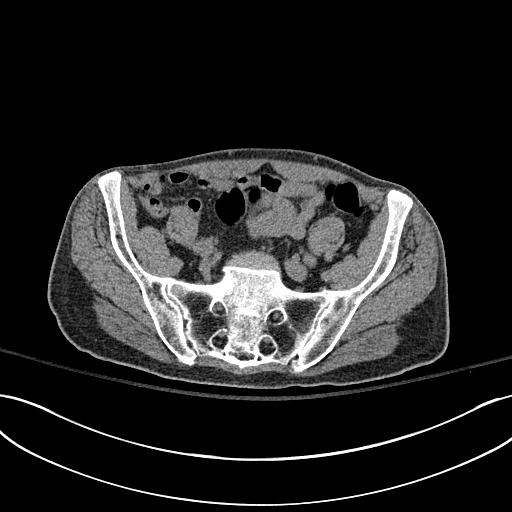
[im 106/145  lung]
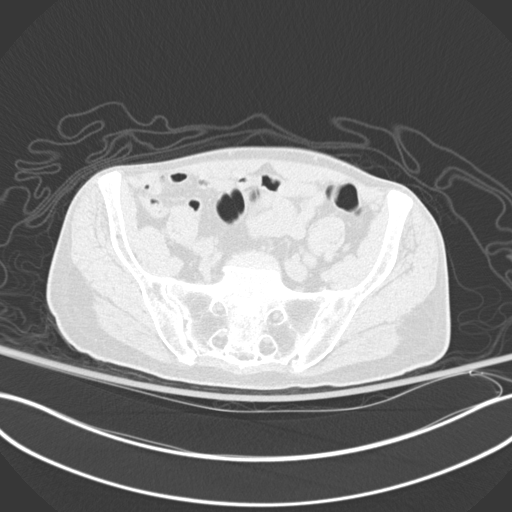
[im 116/145  soft-tissue]
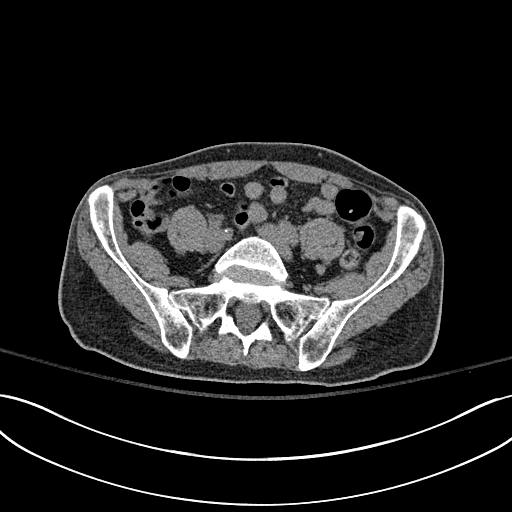
[im 116/145  lung]
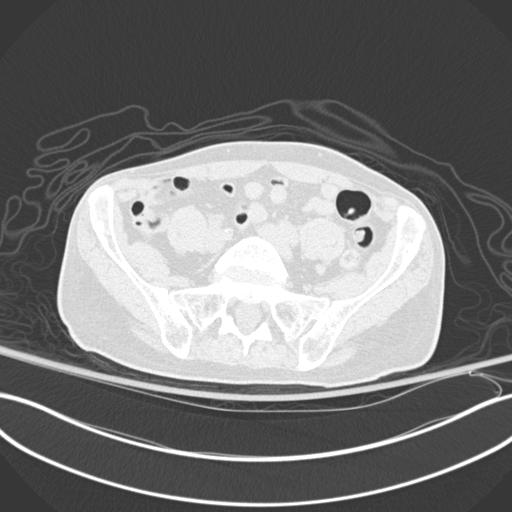
[im 116/145  bone]
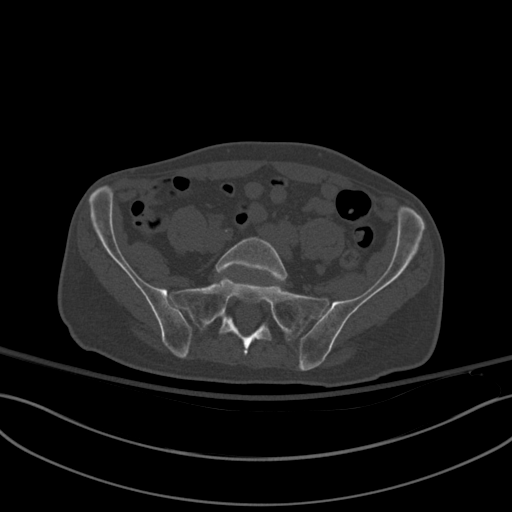
[im 125/145  lung]
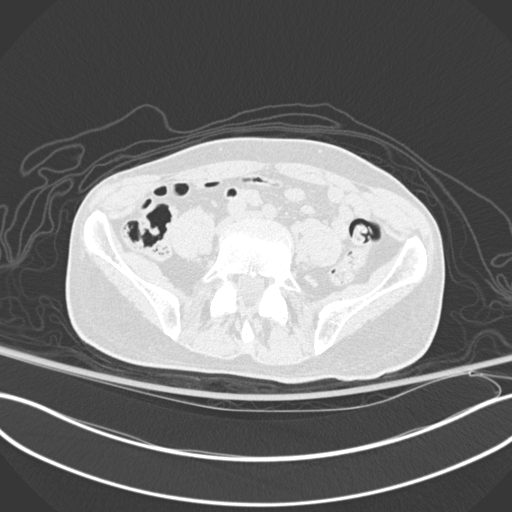
[im 135/145  soft-tissue]
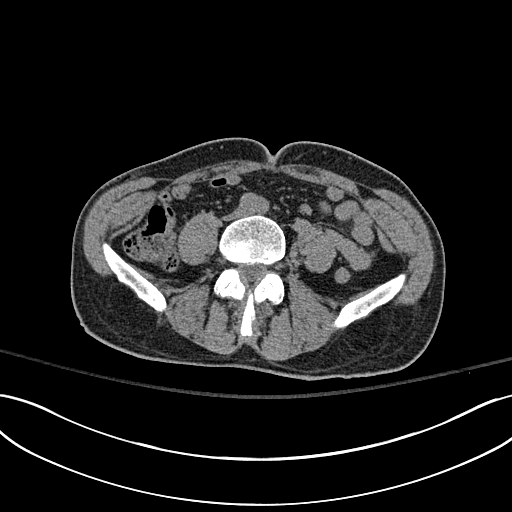
[im 135/145  lung]
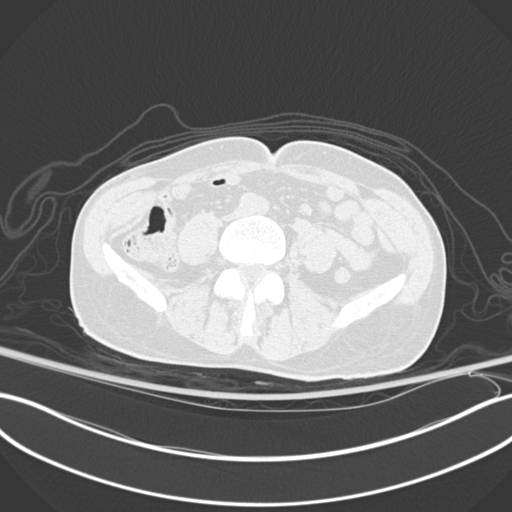

[Series 9: coronal st · coronal · 0.59mm/px · 3 of 106 slices shown]
[im 22/106  soft-tissue]
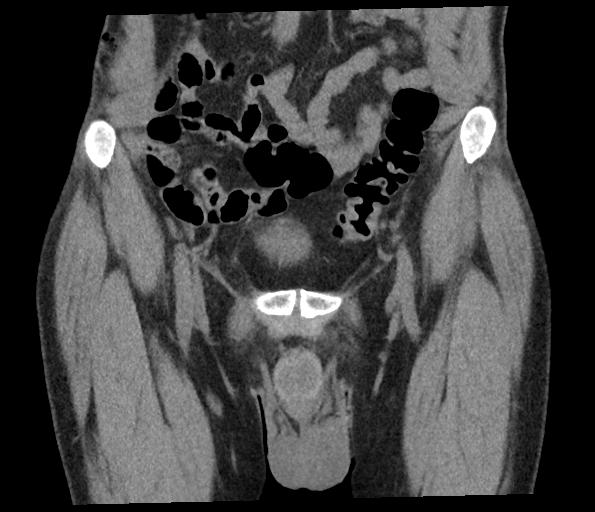
[im 43/106  soft-tissue]
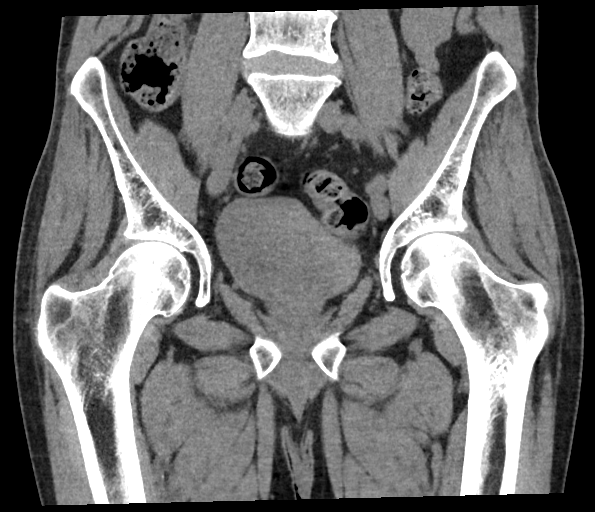
[im 64/106  soft-tissue]
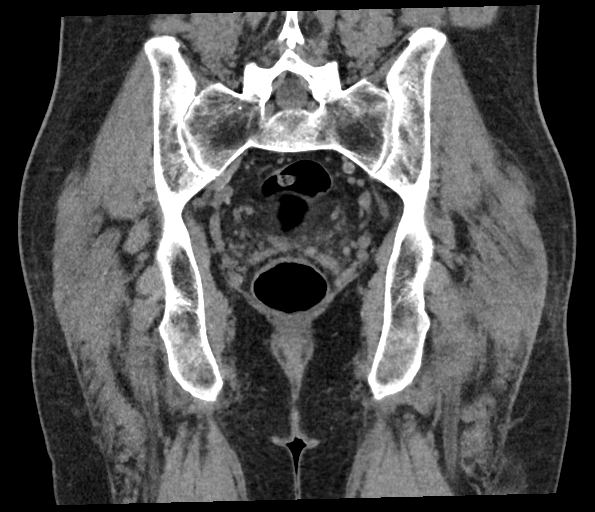

[14 of 46 positions shown; findings below may reference images not displayed]

FINDINGS: Urinary Tract: Excreted contrast within the urinary bladder from
previous CT. Distal ureters are unremarkable.

Bowel: No bowel obstruction or ileus. No bowel wall thickening or
inflammatory change.

Vascular/Lymphatic: No pathologically enlarged lymph nodes. No
significant vascular abnormality seen.

Reproductive:  Prostate is unremarkable.

Other: No free fluid or free gas. Small fat containing right
inguinal hernia unchanged. No bowel herniation.

Musculoskeletal: There are no acute displaced fractures. Mild
symmetrical bilateral hip osteoarthritis.
IMPRESSION: 1. No acute displaced fracture. Mild symmetrical bilateral hip
osteoarthritis.
2. Small fat containing right inguinal hernia.

## 2021-11-07 MED ORDER — OXYCODONE HCL 5 MG PO TABS
5.0000 mg | ORAL_TABLET | Freq: Four times a day (QID) | ORAL | Status: DC | PRN
  Administered 2021-11-07 – 2021-11-09 (×4): 5 mg via ORAL
  Filled 2021-11-07 (×4): qty 1

## 2021-11-07 MED ORDER — FLUTICASONE FUROATE-VILANTEROL 100-25 MCG/ACT IN AEPB
1.0000 | INHALATION_SPRAY | Freq: Every morning | RESPIRATORY_TRACT | Status: DC
  Administered 2021-11-08 – 2021-11-09 (×2): 1 via RESPIRATORY_TRACT
  Filled 2021-11-07: qty 28

## 2021-11-07 MED ORDER — GADOBUTROL 1 MMOL/ML IV SOLN
6.0000 mL | Freq: Once | INTRAVENOUS | Status: AC | PRN
  Administered 2021-11-07: 6 mL via INTRAVENOUS

## 2021-11-07 MED ORDER — HYDROMORPHONE HCL 1 MG/ML IJ SOLN
1.0000 mg | INTRAMUSCULAR | Status: AC | PRN
  Administered 2021-11-07 – 2021-11-08 (×4): 1 mg via INTRAVENOUS
  Filled 2021-11-07 (×4): qty 1

## 2021-11-07 MED ORDER — ATORVASTATIN CALCIUM 20 MG PO TABS
20.0000 mg | ORAL_TABLET | Freq: Every day | ORAL | Status: DC
Start: 2021-11-07 — End: 2021-11-09
  Administered 2021-11-07 – 2021-11-09 (×3): 20 mg via ORAL
  Filled 2021-11-07 (×3): qty 1

## 2021-11-07 MED ORDER — ENOXAPARIN SODIUM 40 MG/0.4ML IJ SOSY
40.0000 mg | PREFILLED_SYRINGE | INTRAMUSCULAR | Status: DC
  Administered 2021-11-07 – 2021-11-08 (×2): 40 mg via SUBCUTANEOUS
  Filled 2021-11-07 (×2): qty 0.4

## 2021-11-07 MED ORDER — GABAPENTIN 250 MG/5ML PO SOLN
250.0000 mg | Freq: Every day | ORAL | Status: DC
Start: 2021-11-07 — End: 2021-11-09
  Administered 2021-11-07 – 2021-11-08 (×2): 250 mg via ORAL
  Filled 2021-11-07 (×6): qty 5

## 2021-11-07 MED ORDER — RILUZOLE 50 MG PO TABS
50.0000 mg | ORAL_TABLET | Freq: Two times a day (BID) | ORAL | Status: DC
Start: 2021-11-07 — End: 2021-11-09

## 2021-11-07 MED ORDER — ALBUTEROL SULFATE (2.5 MG/3ML) 0.083% IN NEBU: 3.0000 mL | INHALATION_SOLUTION | RESPIRATORY_TRACT | Status: DC | PRN

## 2021-11-07 MED ORDER — HYDROMORPHONE HCL 1 MG/ML IJ SOLN
INTRAMUSCULAR | Status: AC
  Administered 2021-11-07: 0.5 mg via INTRAVENOUS
  Filled 2021-11-07: qty 1

## 2021-11-07 MED ORDER — HYDROMORPHONE HCL 1 MG/ML IJ SOLN
0.5000 mg | Freq: Once | INTRAMUSCULAR | Status: AC
  Administered 2021-11-07: 0.5 mg via INTRAVENOUS
  Filled 2021-11-07: qty 1

## 2021-11-07 MED ORDER — SENNOSIDES-DOCUSATE SODIUM 8.6-50 MG PO TABS
1.0000 | ORAL_TABLET | Freq: Every day | ORAL | Status: DC
  Administered 2021-11-07 – 2021-11-08 (×2): 1 via ORAL
  Filled 2021-11-07 (×2): qty 1

## 2021-11-07 MED ORDER — HYDROMORPHONE HCL 1 MG/ML IJ SOLN
1.0000 mg | Freq: Once | INTRAMUSCULAR | Status: AC
  Administered 2021-11-07: 1 mg via INTRAVENOUS
  Filled 2021-11-07: qty 1

## 2021-11-07 MED ORDER — LACTATED RINGERS IV SOLN: INTRAVENOUS | Status: DC

## 2021-11-07 MED ORDER — ACETAMINOPHEN 650 MG RE SUPP: 650.0000 mg | Freq: Four times a day (QID) | RECTAL | Status: DC | PRN

## 2021-11-07 MED ORDER — PROCHLORPERAZINE EDISYLATE 10 MG/2ML IJ SOLN
10.0000 mg | Freq: Four times a day (QID) | INTRAMUSCULAR | Status: DC | PRN
  Administered 2021-11-07: 10 mg via INTRAVENOUS
  Filled 2021-11-07 (×2): qty 2

## 2021-11-07 MED ORDER — BACLOFEN 10 MG PO TABS
10.0000 mg | ORAL_TABLET | Freq: Three times a day (TID) | ORAL | Status: DC
  Administered 2021-11-07 – 2021-11-09 (×6): 10 mg via ORAL
  Filled 2021-11-07 (×9): qty 1

## 2021-11-07 MED ORDER — HYDROMORPHONE HCL 1 MG/ML IJ SOLN: 0.5000 mg | Freq: Once | INTRAMUSCULAR | Status: AC

## 2021-11-07 MED ORDER — MONTELUKAST SODIUM 10 MG PO TABS
10.0000 mg | ORAL_TABLET | Freq: Every day | ORAL | Status: DC
  Administered 2021-11-07 – 2021-11-08 (×2): 10 mg via ORAL
  Filled 2021-11-07 (×2): qty 1

## 2021-11-07 MED ORDER — TRAZODONE HCL 100 MG PO TABS
100.0000 mg | ORAL_TABLET | Freq: Every day | ORAL | Status: DC
  Administered 2021-11-07 – 2021-11-08 (×2): 100 mg via ORAL
  Filled 2021-11-07 (×2): qty 1

## 2021-11-07 MED ORDER — ACETAMINOPHEN 325 MG PO TABS: 650.0000 mg | ORAL_TABLET | Freq: Four times a day (QID) | ORAL | Status: DC | PRN

## 2021-11-07 NOTE — ED Notes (Signed)
Sister at bedside.

## 2021-11-07 NOTE — ED Provider Notes (Signed)
[facility] Provider Note    Event Date/Time   First MD Initiated Contact with Patient 11/07/21 [phone]     (approximate)   History   Fall   HPI  Shawn [name] is a 61 y.o. male past medical history of ALS, coronary disease, hyperlipidemia who presents with back pain and a fall.  Last night patient was using his walker when he lost balance fell onto his lower back.  Did not hit his head.  He had difficulty getting off the floor was on the floor for about 4 hours but is able to crawl to his bed and get up.  Has had severe pain in the lower back radiating to the bilateral lower legs with associated numbness and weakness in his legs.  Denies bowel or bladder incontinence.  No fevers or chills.  No history of back pain previously.  Patient denies being weak in his lower extremities from his ALS.    Past Medical History:  Diagnosis Date   ALS (amyotrophic lateral sclerosis) (HCC)    Asthma    Coronary artery disease    Hyperlipidemia    Renal stone    Stroke [facility])     Patient Active Problem List   Diagnosis Date Noted   Ureteral stone 05/20/2015     Physical Exam  Triage Vital Signs: ED Triage Vitals  Enc Vitals Group     BP 11/07/21 0920 122/79     Pulse Rate 11/07/21 0920 78     Resp 11/07/21 0920 20     Temp 11/07/21 0920 97.6 F (36.4 C)     Temp Source 11/07/21 0920 Oral     SpO2 11/07/21 0920 96 %     Weight 11/07/21 0925 135 lb (61.2 kg)     Height 11/07/21 0925 5\' 10"  (1.778 m)     Head Circumference --      Peak Flow --      Pain Score 11/07/21 0924 10     Pain Loc --      Pain Edu? --      Excl. in GC? --     Most recent vital signs: Vitals:   11/07/21 1130 11/07/21 1348  BP: 112/69 108/83  Pulse: 72 74  Resp: 19 15  Temp:    SpO2: 94% 95%     General: Awake, patient appears uncomfortable, groaning in pain CV:  Good peripheral perfusion.  2+ DP pulses bilaterally Resp:  Normal effort.  No chest wall  tenderness Abd:  No distention.  Abdomen is soft and nontender Neuro:             Awake, Alert, Oriented x 3; speech is slowed Other:  5/5 strength in the bilateral upper extremities with grip, flexion and extension of the elbows Unable to test hip strength given significant pain 3 out of 5 strength with plantarflexion dorsiflexion on the right, 2 out of 5 on the left Subjective decreased sensation from the feet up to the thighs Midline tenderness to palpation in the low thoracic and lumbar region  ED Results / Procedures / Treatments  Labs (all labs ordered are listed, but only abnormal results are displayed) Labs Reviewed  COMPREHENSIVE METABOLIC PANEL - Abnormal; Notable for the following components:      Result Value   Glucose, Bld 106 (*)    All other components within normal limits  CBC WITH DIFFERENTIAL/PLATELET - Abnormal; Notable for the following components:   RBC 3.70 (*)  Hemoglobin 12.5 (*)    HCT 37.3 (*)    MCV 100.8 (*)    All other components within normal limits  RESP PANEL BY RT-PCR (FLU A&B, COVID) ARPGX2  CK     EKG     RADIOLOGY I reviewed the x-ray of the pelvis which is negative for acute fracture dislocation  Reviewed the MRI of the T and L-spine do not see any obvious fracture or cord compression   PROCEDURES:  Critical Care performed: No  .1-3 Lead EKG Interpretation Performed by: Georga Hacking, MD Authorized by: Georga Hacking, MD     Interpretation: normal     ECG rate assessment: normal     Rhythm: sinus rhythm     Ectopy: none     Conduction: normal    The patient is on the cardiac monitor to evaluate for evidence of arrhythmia and/or significant heart rate changes.   MEDICATIONS ORDERED IN ED: Medications  HYDROmorphone (DILAUDID) injection 0.5 mg (0.5 mg Intravenous Given 11/07/21 1003)  HYDROmorphone (DILAUDID) injection 0.5 mg (0.5 mg Intravenous Given 11/07/21 1034)  gadobutrol (GADAVIST) 1 MMOL/ML injection 6 mL  (6 mLs Intravenous Contrast Given 11/07/21 1227)  HYDROmorphone (DILAUDID) injection 1 mg (1 mg Intravenous Given 11/07/21 1347)     IMPRESSION / MDM / ASSESSMENT AND PLAN / ED COURSE  I reviewed the triage vital signs and the nursing notes.                              Differential diagnosis includes, but is not limited to, thoracic or lumbar spine fracture, conus medullaris, cauda equina syndrome, pathologic fracture  Is a 61 year old male with a history of ALS who presents with back pain after a fall with associated numbness and weakness in his lower extremities.  Patient fell last night onto his lower back without hitting his head.  Since that time he has had severe low back pain with bilateral lower extremity numbness and weakness and pain.  Patient appears uncomfortable.  Even small movements in the bed cause him significant pain.  He appears to have somewhat normal strength in his upper extremities but he is clearly diminished with plantarflexion dorsiflexion and lower extremities left greater than right and I am really not able to even strength of the hip flexors as patient says he is unable to lift the leg.  Sensation is subjectively decreased in the bilateral lower extremities from feet to thighs as well.  He is tender on exam in the low thoracic/upper lumbar region.  I am certainly concerned given the history of trauma and his exam for acute spinal cord compression.  Patient is laying flat we will keep him that way.  Given Dilaudid.  Will obtain MRI of the thoracic and lumbar spine.  MRI of the lumbar and thoracic spine are negative other than an acute compression fraction of L1 with no bony retropulsion.  Obtained an x-ray of the pelvis as well which is negative for fracture.  Patient required multiple doses of IV opioids and pain was poorly controlled in the ED.  Will admit for pain control.  Discussed with the hospitalist.      FINAL CLINICAL IMPRESSION(S) / ED DIAGNOSES   Final  diagnoses:  Compression fracture of L2 vertebra, initial encounter (HCC)     Rx / DC Orders   ED Discharge Orders     None        Note:  This  document was prepared using Conservation officer, historic buildings and may include unintentional dictation errors.   Georga Hacking, MD 11/07/21 1434

## 2021-11-07 NOTE — ED Notes (Signed)
Pt alert, NAD, calm, interactive, resps e/u, speaking in delayed soft horse voice. Pt to MRI at this time by stretcher. Mentions back discomfort.

## 2021-11-07 NOTE — ED Notes (Signed)
Dr McHugh at bedside 

## 2021-11-07 NOTE — Progress Notes (Signed)
Orthopedic Tech Progress Note Patient Details:  Shawn [name] 06-01-1961 [id number]  Called in order to [facility] for a LSO BRACE   Patient ID: Shawn [name], male   DOB: [birth year], 61 y.o.   MRN: [id number]  Janit Pagan 11/07/2021, 5:13 PM

## 2021-11-07 NOTE — Evaluation (Signed)
Physical Therapy Evaluation Patient Details Name: Shawn [name] MRN: [id number] DOB: [birth year] Today's Date: 11/07/2021  History of Present Illness  Pt is a 61 y.o. male with medical history significant for ALS, CAD, and asthma who presents by EMS after fall. MD assessment includes: Compression fracture of L2 with approximately 10% vertebral body height loss and lumbago.   Clinical Impression  Pt was pleasant and motivated to participate during the session and put forth good effort throughout but ultimately was very limited by pain despite being pre-medicated.  Pt education provided on log roll training to minimize low back pain with bed mobility tasks but pt was only able to come partially up towards a sitting position at the EOB, grossly to 45 deg, before needing to lie back down secondary to pain. Pt lives alone with no support and would not be safe to return to his prior living situation at this time.  Pt will benefit from PT services in a SNF setting upon discharge to safely address deficits listed in patient problem list for decreased caregiver assistance and eventual return to PLOF.         Recommendations for follow up therapy are one component of a multi-disciplinary discharge planning process, led by the attending physician.  Recommendations may be updated based on patient status, additional functional criteria and insurance authorization.  Follow Up Recommendations Skilled nursing-short term rehab (<3 hours/day)    Assistance Recommended at Discharge Frequent or constant Supervision/Assistance  Patient can return home with the following  Two people to help with walking and/or transfers;Two people to help with bathing/dressing/bathroom;Assistance with cooking/housework;Direct supervision/assist for medications management;Direct supervision/assist for financial management;Assist for transportation;Help with stairs or ramp for entrance    Equipment Recommendations BSC/3in1   Recommendations for Other Services       Functional Status Assessment Patient has had a recent decline in their functional status and demonstrates the ability to make significant improvements in function in a reasonable and predictable amount of time.     Precautions / Restrictions Precautions Precautions: Fall;Back Required Braces or Orthoses: Spinal Brace Spinal Brace: Lumbar corset Restrictions Weight Bearing Restrictions: No Other Position/Activity Restrictions: LSO with ambulation      Mobility  Bed Mobility Overal bed mobility: Needs Assistance Bed Mobility: Rolling, Sidelying to Sit, Sit to Sidelying Rolling: Mod assist Sidelying to sit: Max assist     Sit to sidelying: Max assist General bed mobility comments: Log roll training provided with mod verbal and tactile cues for sequencing and max A for BLE and trunk control    Transfers                   General transfer comment: deferred secondary to pain and awaiting LSO    Ambulation/Gait                  Stairs            Wheelchair Mobility    Modified Rankin (Stroke Patients Only)       Balance                                             Pertinent Vitals/Pain Pain Assessment Pain Assessment: 0-10 Pain Score: 9  Pain Location: low back/groin Pain Descriptors / Indicators: Aching, Sore, Grimacing, Guarding Pain Intervention(s): Premedicated before session, Monitored during session, Limited activity within patient's tolerance  Home Living Family/patient expects to be discharged to:: Private residence Living Arrangements: Alone Available Help at Discharge: Other (Comment) (No assist available) Type of Home: Apartment Home Access: Ramped entrance       Home Layout: One level Home Equipment: Rolling Walker (2 wheels);Grab bars - tub/shower      Prior Function Prior Level of Function : Independent/Modified Independent             Mobility  Comments: Mod Ind with amb community distances with a RW, 2 total falls including current fall in the last 6 months both from LOB ADLs Comments: Ind with ADLs     Hand Dominance        Extremity/Trunk Assessment   Upper Extremity Assessment Upper Extremity Assessment: Generalized weakness    Lower Extremity Assessment Lower Extremity Assessment: Generalized weakness       Communication   Communication: Other (comment) (soft spoken)  Cognition Arousal/Alertness: Awake/alert Behavior During Therapy: WFL for tasks assessed/performed Overall Cognitive Status: Within Functional Limits for tasks assessed                                          General Comments      Exercises Total Joint Exercises Ankle Circles/Pumps: AROM, Strengthening, Both, 10 reps Quad Sets: Strengthening, Both, 10 reps Gluteal Sets: Strengthening, Both, 10 reps Heel Slides: AAROM, Strengthening, Both, 5 reps Other Exercises Other Exercises: Log roll training Other Exercises: HEP education for BLE APs, QS, and GS x 10 each every 1-2 hours daily   Assessment/Plan    PT Assessment Patient needs continued PT services  PT Problem List Decreased strength;Decreased activity tolerance;Decreased balance;Decreased mobility;Decreased knowledge of use of DME;Decreased knowledge of precautions;Pain       PT Treatment Interventions DME instruction;Gait training;Functional mobility training;Therapeutic activities;Therapeutic exercise;Neuromuscular re-education    PT Goals (Current goals can be found in the Care Plan section)  Acute Rehab PT Goals Patient Stated Goal: Better balance PT Goal Formulation: With patient Time For Goal Achievement: 11/20/21 Potential to Achieve Goals: Good    Frequency 7X/week     Co-evaluation               AM-PAC PT "6 Clicks" Mobility  Outcome Measure Help needed turning from your back to your side while in a flat bed without using bedrails?: A  Lot Help needed moving from lying on your back to sitting on the side of a flat bed without using bedrails?: A Lot Help needed moving to and from a bed to a chair (including a wheelchair)?: A Lot Help needed standing up from a chair using your arms (e.g., wheelchair or bedside chair)?: A Lot Help needed to walk in hospital room?: Total Help needed climbing 3-5 steps with a railing? : Total 6 Click Score: 10    End of Session   Activity Tolerance: Patient limited by pain Patient left: in bed;with call bell/phone within reach;with bed alarm set Nurse Communication: Mobility status;Precautions PT Visit Diagnosis: Unsteadiness on feet (R26.81);History of falling (Z91.81);Difficulty in walking, not elsewhere classified (R26.2);Pain Pain - Right/Left:  (midline) Pain - part of body:  (low back and groin)    Time: 1610-9604 PT Time Calculation (min) (ACUTE ONLY): 42 min   Charges:   PT Evaluation $PT Eval Moderate Complexity: 1 Mod PT Treatments $Therapeutic Exercise: 8-22 mins $Therapeutic Activity: 8-22 mins        D. Scott  Evalee Gerard PT, DPT 11/07/21, 5:26 PM

## 2021-11-07 NOTE — Consult Note (Signed)
Neurosurgery-New Consultation Evaluation 11/07/2021 Shawn [name] [id number]  Identifying Statement: Shawn [name] is a 61 y.o. male from [place] [address] with a history of ALS, CAD, HLD presenting with back and leg pain after a mechanical fall.  Physician Requesting Consultation: No ref. provider found  History of Present Illness: Shawn [name] is a 61 year old male presenting after a mechanical fall.  He states that he was ambulating with his walker last night when he turned to get some paper towels out of a cabinet.  He lost his balance and fell backward hitting his low back on the floor.  He states he did not lose consciousness.  He was able to crawl to his bed and get up and EMS was called.  He presented to the ER this morning and complains of severe low back pain that radiates into his medial inner thighs with associated burning and pain in his calves and feet bilaterally.  He states that this is constant but is new since the fall.  He denies any episodes of bowel or bladder incontinence.  Past Medical History:  Past Medical History:  Diagnosis Date   ALS (amyotrophic lateral sclerosis) ([place])    Asthma    Coronary artery disease    Hyperlipidemia    Renal stone    Stroke [facility])     Social History: Social History   Socioeconomic History   Marital status: Single    Spouse name: Not on file   Number of children: Not on file   Years of education: Not on file   Highest education level: Not on file  Occupational History   Not on file  Tobacco Use   Smoking status: Never   Smokeless tobacco: Never  Vaping Use   Vaping Use: Never used  Substance and Sexual Activity   Alcohol use: Not Currently    Alcohol/week: 0.0 standard drinks    Comment: occasionally   Drug use: Never   Sexual activity: Not on file  Other Topics Concern   Not on file  Social History Narrative   Not on file   Social Determinants of Health   Financial Resource Strain: Not on file  Food  Insecurity: Not on file  Transportation Needs: Not on file  Physical Activity: Not on file  Stress: Not on file  Social Connections: Not on file  Intimate Partner Violence: Not on file    Family History: Family History  Problem Relation Age of Onset   Cancer Mother    Heart failure Mother    Hypertension Mother    CVA Mother    Diabetes Other    Other Father        unknown medical history    Review of Systems:  Review of Systems - General ROS: Negative Psychological ROS: Negative Ophthalmic ROS: Negative ENT ROS: Negative Hematological and Lymphatic ROS: Negative  Endocrine ROS: Negative Respiratory ROS: Negative Cardiovascular ROS: Negative Gastrointestinal ROS: Negative Genito-Urinary ROS: Negative Musculoskeletal ROS: Negative Neurological ROS: Negative Dermatological ROS: Negative  Physical Exam: BP 108/83    Pulse 74    Temp 97.6 F (36.4 C) (Oral)    Resp 15    Ht 5\' 10"  (1.778 m)    Wt 61.2 kg    SpO2 95%    BMI 19.37 kg/m  Body mass index is 19.37 kg/m. Body surface area is 1.74 meters squared. General appearance: Alert, cooperative, in mild distress 2/2 to pain  Head: Normocephalic, atraumatic Eyes: Normal, EOM intact Oropharynx: Moist without lesions  Neck: Supple, no tenderness Lungs: no obvious SOB Abdomen: Soft, nondistended Ext: No edema in LE bilaterally, good distal pulses  Neurologic exam:  Mental status: alertness: alert, orientation: person, place, time, affect: normal Speech: Somewhat breathy, hoarse and slowed.  Cranial nerves:  CN II-XII grossly intact  Motor: 5/5 strength in BUE. 3/5 bilateral HP and KE. 4+/5 bilateral DF and EHL, 3/5 left PF 4-/5 right PF. Sensory: Sensation to touch throughout bilateral lower extremities.  Intact sensation throughout thorax and upper extremities. Reflexes: 3+ and symmetric bilaterally for arms and legs Coordination: intact finger to nose Gait: untested  Laboratory: Results for orders placed or  performed during the hospital encounter of 11/07/21  Resp Panel by RT-PCR (Flu A&B, Covid) Nasopharyngeal Swab   Specimen: Nasopharyngeal Swab; Nasopharyngeal(NP) swabs in vial transport medium  Result Value Ref Range   SARS Coronavirus 2 by RT PCR NEGATIVE NEGATIVE   Influenza A by PCR NEGATIVE NEGATIVE   Influenza B by PCR NEGATIVE NEGATIVE  Comprehensive metabolic panel  Result Value Ref Range   Sodium 136 135 - 145 mmol/L   Potassium 3.6 3.5 - 5.1 mmol/L   Chloride 106 98 - 111 mmol/L   CO2 25 22 - 32 mmol/L   Glucose, Bld 106 (H) 70 - 99 mg/dL   BUN 15 8 - 23 mg/dL   Creatinine, Ser 0.74 0.61 - 1.24 mg/dL   Calcium 9.2 8.9 - 10.3 mg/dL   Total Protein 6.6 6.5 - 8.1 g/dL   Albumin 4.0 3.5 - 5.0 g/dL   AST 23 15 - 41 U/L   ALT 25 0 - 44 U/L   Alkaline Phosphatase 87 38 - 126 U/L   Total Bilirubin 0.8 0.3 - 1.2 mg/dL   GFR, Estimated >60 >60 mL/min   Anion gap 5 5 - 15  CBC with Differential  Result Value Ref Range   WBC 7.3 4.0 - 10.5 K/uL   RBC 3.70 (L) 4.22 - 5.81 MIL/uL   Hemoglobin 12.5 (L) 13.0 - 17.0 g/dL   HCT 37.3 (L) 39.0 - 52.0 %   MCV 100.8 (H) 80.0 - 100.0 fL   MCH 33.8 26.0 - 34.0 pg   MCHC 33.5 30.0 - 36.0 g/dL   RDW 12.5 11.5 - 15.5 %   Platelets 223 150 - 400 K/uL   nRBC 0.0 0.0 - 0.2 %   Neutrophils Relative % 68 %   Neutro Abs 4.8 1.7 - 7.7 K/uL   Lymphocytes Relative 24 %   Lymphs Abs 1.8 0.7 - 4.0 K/uL   Monocytes Relative 8 %   Monocytes Absolute 0.6 0.1 - 1.0 K/uL   Eosinophils Relative 0 %   Eosinophils Absolute 0.0 0.0 - 0.5 K/uL   Basophils Relative 0 %   Basophils Absolute 0.0 0.0 - 0.1 K/uL   Immature Granulocytes 0 %   Abs Immature Granulocytes 0.03 0.00 - 0.07 K/uL  CK  Result Value Ref Range   Total CK 79 49 - 397 U/L   I personally reviewed labs  Imaging:  MRI T spine 11/07/21 IMPRESSION: Unremarkable pre and post-contrast MRI of the thoracic spine.     Electronically Signed   By: Davina Poke D.O.   On: 11/07/2021  12:40  MRI L spine 11/07/21 IMPRESSION: 1. Acute mild superior endplate compression fracture of L2 with approximately 10% vertebral body height loss. No retropulsion. 2. Mild lower lumbar spondylosis with borderline-mild left foraminal stenosis at L4-5. No canal stenosis at any level.  Electronically Signed   By: Davina Poke D.O.   On: 11/07/2021 12:43    I personally reviewed radiology studies to include:   Impression/Plan:     1.  Diagnosis: L2 compression fracture and intractable pain.  2.  Plan - brace for when the patient is ambulating out of bed. -Admit to hospital service for pain control. He may need further work up of his leg pain as his mild L2 compression fracture is not likely to be the cause. - can consider upright lumbar xrays prior to discharge - will f/u outpatient in 3-4 weeks. - Please call with any questions or concerns.   Cooper Render PA-C Neurosurgery

## 2021-11-07 NOTE — ED Triage Notes (Signed)
Patient arrived by EMS from home for fall last night. Reports losing his balance causing him to fall. Patient also states he laid in the floor for hours before he was able to get up in bed. Baseline ambulates with walker but unable to since fall. C/o lower back pain that radiates down both legs to feet. Also c/o numbness in feet.

## 2021-11-07 NOTE — H&P (Addendum)
History and Physical    Shawn [name] [id number] DOB: 1960-10-30 DOA: 11/07/2021  [facility]   Patient coming from: Home  Chief Complaint: Back pain  HPI: Shawn [name] is a 61 y.o. male with medical history significant for ALS, CAD, Asthma who presents by EMS after fall at home last night.  Reports he was ambulating with his walker when he lost his balance and fell backward hitting his low back on the floor.  He had no head trauma or loss of consciousness.  He stayed in the floor for a few hours and was eventually able to crawl to his bed and called for EMS.  Of low back pain and pain in his medial inner thighs bilaterally.  He also has a burning pain in his calves and feet bilaterally that is worse than normal.  Pain has been constant since the fall.  He reports the pain is a severe pain.  He has not had any bowel or bladder incontinence.  Denies any fever, dysuria, abdominal pain nausea vomiting diarrhea chest pain or shortness of breath.   ED Course: [name] been hemodynamically stable in the emergency room.  MRIs of his lumbar sacral spine do not show any cord compression.  He does have a L2 superior endplate compression fracture of approximately 10%.  He has been evaluated by neurosurgery the emergency room and will be placed in a brace.  There is no surgical intervention needed at this time.  Patient has required multiple dose of pain medication for pain control.  Lab work is unremarkable.  Hospitalist service asked to admit for further management  Review of Systems:  General: Denies fever, chills, weight loss, night sweats.  Denies dizziness. Denies change in appetite HENT: Denies head trauma, headache, denies change in hearing, tinnitus.  Denies nasal congestion or bleeding.  Denies sore throat,Denies difficulty swallowing Eyes: Denies blurry vision, pain in eye, drainage.  Denies discoloration of eyes. Neck: Denies pain.  Denies swelling.  Denies  pain with movement. Cardiovascular: Denies chest pain, palpitations.  Denies edema.  Denies orthopnea Respiratory: Denies shortness of breath, cough.  Denies wheezing.  Denies sputum production Gastrointestinal: Denies abdominal pain, swelling. Denies nausea, vomiting, diarrhea.  Denies melena.  Denies hematemesis. Musculoskeletal: Reports pain in back and thighs, legs with limitation of movement lower extremities. Denies deformity or swelling.  Genitourinary: Denies pelvic pain.  Denies urinary frequency or hesitancy.  Denies dysuria.  Skin: Denies rash.  Denies petechiae, purpura, ecchymosis. Neurological: Denies syncope. Denies seizure activity. Denies slurred speech, drooping face. Denies visual change. Psychiatric: Denies depression, anxiety. Denies hallucinations.  Past Medical History:  Diagnosis Date   ALS (amyotrophic lateral sclerosis) ([facility])    Asthma    Coronary artery disease    Hyperlipidemia    Renal stone    Stroke [facility])     Past Surgical History:  Procedure Laterality Date   CARDIAC CATHETERIZATION  2006   negative   CYSTOSCOPY W/ URETERAL STENT REMOVAL Left 06/11/2015   Procedure: CYSTOSCOPY WITH STENT EXCHANGE;  Surgeon: Malen Gauze, MD;  Location: [facility] ORS;  Service: Urology;  Laterality: Left;   CYSTOSCOPY WITH STENT PLACEMENT Left 05/20/2015   Procedure: CYSTOSCOPY WITH STENT PLACEMENT;  Surgeon: Crist Fat, MD;  Location: [facility] ORS;  Service: Urology;  Laterality: Left;   CYSTOSCOPY/RETROGRADE/URETEROSCOPY/STONE EXTRACTION WITH BASKET  2000   pt. unsure of which side done   CYSTOSCOPY/URETEROSCOPY/HOLMIUM LASER Left 06/11/2015   Procedure: CYSTOSCOPY/URETEROSCOPY/HOLMIUM LASER;  Surgeon: Mardene Celeste  McKenzie, MD;  Location: [facility] ORS;  Service: Urology;  Laterality: Left;   KNEE SURGERY Right 84,96, 2003   x3   SHOULDER SURGERY Left 2010    Social History  reports that he has never smoked. He has never used smokeless tobacco. He reports that he does  not currently use alcohol. He reports that he does not use drugs.  Allergies  Allergen Reactions   Aspirin Other (See Comments), Itching, Shortness Of Breath and Swelling    Upset stomach  GI Upset   Morphine And Related Itching   Naproxen Other (See Comments)    Upset stomach  GI Upset   Penicillins Rash   Tramadol Itching    Family History  Problem Relation Age of Onset   Cancer Mother    Heart failure Mother    Hypertension Mother    CVA Mother    Diabetes Other    Other Father        unknown medical history     Prior to Admission medications   Medication Sig Start Date End Date Taking? Authorizing Provider  acetaminophen (TYLENOL) 160 MG/5ML solution Take 480 mg by mouth every 6 (six) hours as needed for mild pain or moderate pain.   Yes [provider]  albuterol (VENTOLIN HFA) 108 (90 Base) MCG/ACT inhaler Inhale 1-2 puffs into the lungs every 6 (six) hours as needed for wheezing or shortness of breath.   Yes [provider]  atorvastatin (LIPITOR) 20 MG tablet Take 20 mg by mouth daily. 06/25/20  Yes [provider]  Baclofen 5 MG TABS Take 10 mg by mouth 3 (three) times daily.   Yes [provider]  fluticasone furoate-vilanterol (BREO ELLIPTA) 100-25 MCG/ACT AEPB Inhale 1 puff into the lungs in the morning.   Yes [provider]  gabapentin (NEURONTIN) 250 MG/5ML solution Take 250 mg by mouth at bedtime.   Yes [provider]  montelukast (SINGULAIR) 10 MG tablet Take 10 mg by mouth at bedtime.   Yes [provider]  riluzole (RILUTEK) 50 MG tablet Take 50 mg by mouth 2 (two) times daily.   Yes [provider]  traZODone (DESYREL) 100 MG tablet Take 100 mg by mouth at bedtime.   Yes [provider]    Physical Exam: Vitals:   11/07/21 1348 11/07/21 1400 11/07/21 1430 11/07/21 1500  BP: 108/83 (!) 107/94 113/72 133/82  Pulse: 74 77 64 83  Resp: 15 20 12 20   Temp:      TempSrc:       SpO2: 95% 94% 94% 95%  Weight:      Height:        Constitutional: NAD, calm, comfortable Vitals:   11/07/21 1348 11/07/21 1400 11/07/21 1430 11/07/21 1500  BP: 108/83 (!) 107/94 113/72 133/82  Pulse: 74 77 64 83  Resp: 15 20 12 20   Temp:      TempSrc:      SpO2: 95% 94% 94% 95%  Weight:      Height:       General: WDWN, Alert and oriented x3.  Eyes: EOMI, PERRL, conjunctivae normal.  Sclera nonicteric HENT:  [place]/AT, external ears normal.  Nares patent without epistasis.  Mucous membranes are moist. Neck: Soft, normal range of motion, supple, no masses, Trachea midline Respiratory: clear to auscultation bilaterally, no wheezing, no crackles. Normal respiratory effort. No accessory muscle use.  Cardiovascular: Regular rate and rhythm, no murmurs / rubs / gallops. No extremity edema. 2+ pedal pulses. Abdomen:  Soft, no tenderness, nondistended, no rebound or guarding. No masses palpated.Bowel sounds normoactive Musculoskeletal: FROM upper extremities. LROM lower extremities due to pain. Pain in lumbar spine to palpation. no cyanosis. No joint deformity upper and lower extremities. Normal muscle tone.  Skin: Warm, dry, intact no rashes, lesions, ulcers. No induration Neurologic: CN 2-12 grossly intact.  Normal speech.  Sensation intact, patella DTR +1 bilaterally. Strength 5/5 in upper extremities Strength 3/5 in lower extremities.    Psychiatric: Normal judgment and insight.  Normal mood.   Labs on Admission: I have personally reviewed following labs and imaging studies  CBC: Recent Labs  Lab 11/07/21 0931  WBC 7.3  NEUTROABS 4.8  HGB 12.5*  HCT 37.3*  MCV 100.8*  PLT 223    Basic Metabolic Panel: Recent Labs  Lab 11/07/21 0931  NA 136  K 3.6  CL 106  CO2 25  GLUCOSE 106*  BUN 15  CREATININE 0.74  CALCIUM 9.2    GFR: Estimated Creatinine Clearance: 82.9 mL/min (by C-G formula based on SCr of 0.74 mg/dL).  Liver Function Tests: Recent Labs  Lab  11/07/21 0931  AST 23  ALT 25  ALKPHOS 87  BILITOT 0.8  PROT 6.6  ALBUMIN 4.0    Urine analysis:    Component Value Date/Time   COLORURINE YELLOW (A) 05/19/2021 2317   APPEARANCEUR CLEAR (A) 05/19/2021 2317   LABSPEC 1.025 05/19/2021 2317   PHURINE 5.0 05/19/2021 2317   GLUCOSEU NEGATIVE 05/19/2021 2317   HGBUR NEGATIVE 05/19/2021 2317   BILIRUBINUR NEGATIVE 05/19/2021 2317   KETONESUR 80 (A) 05/19/2021 2317   PROTEINUR NEGATIVE 05/19/2021 2317   NITRITE NEGATIVE 05/19/2021 2317   LEUKOCYTESUR NEGATIVE 05/19/2021 2317    Radiological Exams on Admission: DG Pelvis 1-2 Views  Result Date: 11/07/2021 CLINICAL DATA:  Rule out fracture EXAM: PELVIS - 1-2 VIEW COMPARISON:  None. FINDINGS: There is no evidence of acute fracture. Mild bilateral hip osteoarthritis. Decreased femoral head neck offset bilaterally consistent with cam deformities. No focal bone lesion. IMPRESSION: No evidence of acute fracture on single frontal view of the pelvis. Electronically Signed   By: Caprice Renshaw M.D.   On: 11/07/2021 14:27   MR THORACIC SPINE W WO CONTRAST  Result Date: 11/07/2021 CLINICAL DATA:  Fall, back pain EXAM: MRI THORACIC WITHOUT AND WITH CONTRAST TECHNIQUE: Multiplanar and multiecho pulse sequences of the thoracic spine were obtained without and with intravenous contrast. CONTRAST:  5mL GADAVIST GADOBUTROL 1 MMOL/ML IV SOLN COMPARISON:  CT 10/18/2020 FINDINGS: Alignment:  Physiologic. Vertebrae: No fracture, evidence of discitis, or bone lesion. No abnormal enhancement on postcontrast sequences. Cord:  Normal signal and morphology. Paraspinal and other soft tissues: Negative. Disc levels: Negative. Intervertebral discs of the thoracic spine are preserved without disc height loss or focal disc protrusion. Facet joints within normal limits. No foraminal or canal stenosis at any level within the thoracic spine. IMPRESSION: Unremarkable pre and post-contrast MRI of the thoracic spine. Electronically  Signed   By: Duanne Guess D.O.   On: 11/07/2021 12:40   MR Lumbar Spine W Wo Contrast  Result Date: 11/07/2021 CLINICAL DATA:  Low back pain after fall EXAM: MRI LUMBAR SPINE WITHOUT AND WITH CONTRAST TECHNIQUE: Multiplanar and multiecho pulse sequences of the lumbar spine were obtained without and with intravenous contrast. CONTRAST:  81mL GADAVIST GADOBUTROL 1 MMOL/ML IV SOLN COMPARISON:  CT 05/20/2015 FINDINGS: Segmentation:  Standard. Alignment:  Physiologic. Vertebrae: Acute mild superior endplate compression fracture of L2 with approximately 10% vertebral body height  loss. No retropulsion. No additional fractures. No evidence of discitis. No suspicious bone lesion. No abnormal enhancement on postcontrast sequences. Conus medullaris and cauda equina: Conus extends to the T12-L1 level. Conus and cauda equina appear normal. Paraspinal and other soft tissues: Negative. Disc levels: T12-L1 through L3-L4: Unremarkable. L4-L5: Mild annular disc bulge and mild bilateral facet hypertrophy. Borderline-mild left foraminal stenosis. No canal stenosis. L5-S1: No disc protrusion. Mild bilateral facet arthropathy. No foraminal or canal stenosis. IMPRESSION: 1. Acute mild superior endplate compression fracture of L2 with approximately 10% vertebral body height loss. No retropulsion. 2. Mild lower lumbar spondylosis with borderline-mild left foraminal stenosis at L4-5. No canal stenosis at any level. Electronically Signed   By: Duanne Guess D.O.   On: 11/07/2021 12:43    EKG: Independently reviewed.  EKG shows normal sinus rhythm with no acute ST elevation or depression.  QTc 471  Assessment/Plan Principal Problem:   Compression fracture of L2  Mr. Wicht is admitted to MedSurg floor. Dilaudid provided for pain control with intractable pain secondary to compression fracture of L2. Neurosurgery has been consulted and is evaluate patient emergency room.  Brace ordered. Patient will have follow-up with  neurosurgery as an outpatient Consult physical therapy for evaluation  Active Problems:   Lumbago May benefit from standing lumbar or sacral x-rays per neurosurgery if he cannot tolerate standing    ALS (amyotrophic lateral sclerosis)  Chronic    Pain in both lower legs There is no cord compression on MRI.  May be secondary to ALS    Asthma, chronic Continue Breo Ellipta once a day and albuterol MDI as needed. Incentive spirometer every 2 hours while awake  DVT prophylaxis: Lovenox for DVT prophylaxis.   Code Status:   Full Code  Family Communication:  Diagnosis and plan discussed with the patient.  Patient verbalized understanding agrees with plan.  Further recommendation to follow as clinical indicated Disposition Plan:   Patient is from:  Home  Anticipated DC to:  To be determined  Anticipated DC date:  Anticipate 2 midnight or more stay in the hospital   Consults called:  Neurosurgery has evaluated pt in ER  Admission status:  Inpatient  Claudean Severance Mili Piltz MD [facility]  How to contact the [facility] Attending or Consulting provider 7A - 7P or covering provider during after hours 7P -7A, for this patient?   Check the care team in [facility] and look for a) attending/consulting [facility] provider listed and b) the [facility] team listed Log into [email or web address] and use [place]'s universal password to access. If you do not have the password, please contact the hospital operator. Locate the [facility] provider you are looking for under [facility] and page to a number that you can be directly reached. If you still have difficulty reaching the provider, please page the [facility] (Director on Call) for the Hospitalists listed on amion for assistance.  11/07/2021, 4:10 PM    Addendum: Seen by PT and not able to stand. Still with severe pain. Pelvic xray negative earlier. Will get CT pelvis to rule out fracture as etiology of pain. PT recommends SNF at discharge

## 2021-11-08 LAB — CBC
HCT: 34.2 % — ABNORMAL LOW (ref 39.0–52.0)
Hemoglobin: 11.5 g/dL — ABNORMAL LOW (ref 13.0–17.0)
MCH: 33.9 pg (ref 26.0–34.0)
MCHC: 33.6 g/dL (ref 30.0–36.0)
MCV: 100.9 fL — ABNORMAL HIGH (ref 80.0–100.0)
Platelets: 204 10*3/uL (ref 150–400)
RBC: 3.39 MIL/uL — ABNORMAL LOW (ref 4.22–5.81)
RDW: 12.2 % (ref 11.5–15.5)
WBC: 6.7 10*3/uL (ref 4.0–10.5)
nRBC: 0 % (ref 0.0–0.2)

## 2021-11-08 LAB — BASIC METABOLIC PANEL
Anion gap: 4 — ABNORMAL LOW (ref 5–15)
BUN: 13 mg/dL (ref 8–23)
CO2: 26 mmol/L (ref 22–32)
Calcium: 9 mg/dL (ref 8.9–10.3)
Chloride: 106 mmol/L (ref 98–111)
Creatinine, Ser: 0.65 mg/dL (ref 0.61–1.24)
GFR, Estimated: >60 mL/min (ref >60)
Glucose, Bld: 95 mg/dL (ref 70–99)
Potassium: 3.6 mmol/L (ref 3.5–5.1)
Sodium: 136 mmol/L (ref 135–145)

## 2021-11-08 MED ORDER — HYDROMORPHONE HCL 1 MG/ML IJ SOLN
1.0000 mg | INTRAMUSCULAR | Status: DC | PRN
  Administered 2021-11-08 – 2021-11-09 (×3): 1 mg via INTRAVENOUS
  Filled 2021-11-08 (×3): qty 1

## 2021-11-08 MED ORDER — ONDANSETRON 4 MG PO TBDP
4.0000 mg | ORAL_TABLET | Freq: Three times a day (TID) | ORAL | Status: DC | PRN
  Filled 2021-11-08: qty 1

## 2021-11-08 MED ORDER — ENSURE ENLIVE PO LIQD
237.0000 mL | Freq: Two times a day (BID) | ORAL | Status: DC
  Administered 2021-11-09: 237 mL via ORAL

## 2021-11-08 NOTE — Progress Notes (Signed)
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  Presented 11/07/2021 with a fall from home fell backwards hit his low back Pain was severe no bowel bladder incontinence MRI showed L2 superior endplate compression fracture 10% Neurosurgery saw the patient did not recommend any intervention   Hospital-Problem based course  Compression fracture L2 in the setting of ALS Pain control oxycodone 5 mg every 6 as needed for moderate to severe pain Tylenol for mild pain Back brace as per neurosurgery Therapy is recommending skilled placement which is in process ALS Can continue his usual baclofen 10 3 times daily, gabapentin 250 at bedtime, riluzole 50 twice daily Watch mentation Iatrogenic side effect of Compazine Discontinue Compazine as it causes him to feel flushed Start Zofran ODT 4 mg Can DC IV fluid today as eating drinking Moderate to severe mixed dysarthria Speech therapy input sought regarding diet and strengthening exercises -patient will need apparently an endoscopy?  At [facility]-I do not think it is a good idea to perform this here-he will need specialists who are comfortable managing his airway and ALS and he can go back to [facility] for this  DVT prophylaxis: Lovenox Code Status: Full Family Communication: Discussed with partner at the bedside Disposition:  Status is: Inpatient Remains inpatient appropriate because: Not ready for discharge pain control and needs skilled care placement  Consultants:  Speech therapy  Procedures:   Antimicrobials:      Subjective: Awake alert pleasant no distress-hoarse voice noted He has some pain in his back says it is much better than last night   Objective: Vitals:   11/07/21 2340 11/08/21 0406 11/08/21 0752 11/08/21 1516  BP: 119/78 108/67 (!) 112/59 108/69  Pulse: 94 71 70 64  Resp: 16 17 15 16   Temp: 98.9 F (37.2 C) 98.1 F (36.7 C) 98.7 F (37.1 C) 98.3 F (36.8 C)  TempSrc:      SpO2: 95% 96% 95% 95%  Weight:      Height:        Intake/Output Summary (Last 24 hours) at 11/08/2021 1554 Last data filed at 11/08/2021 1035 Gross per 24 hour  Intake 922.18 ml  Output 350 ml  Net 572.18 ml   Filed Weights   11/07/21 0925  Weight: 61.2 kg    Examination:  EOMI NCAT somewhat cachectic CTA B no rales no rhonchi Abdomen soft no rebound no guarding ROM intact in upper extremities Reflexes are very brisk, 5/5 in knees-I did not assess sensory or gait because of pain secondary likely to his fracture  Data Reviewed: personally reviewed   CBC    Component Value Date/Time   WBC 6.7 11/08/2021 0327   RBC 3.39 (L) 11/08/2021 0327   HGB 11.5 (L) 11/08/2021 0327   HCT 34.2 (L) 11/08/2021 0327   PLT 204 11/08/2021 0327   MCV 100.9 (H) 11/08/2021 0327   MCH 33.9 11/08/2021 0327   MCHC 33.6 11/08/2021 0327   RDW 12.2 11/08/2021 0327   LYMPHSABS 1.8 11/07/2021 0931   MONOABS 0.6 11/07/2021 0931   EOSABS 0.0 11/07/2021 0931   BASOSABS 0.0 11/07/2021 0931   CMP Latest Ref Rng & Units 11/08/2021 11/07/2021  05/19/2021  Glucose 70 - 99 mg/dL 95 262(M) 355(H)  BUN 8 - 23 mg/dL 13 15 10   Creatinine 0.61 - 1.24 mg/dL 7.41 6.38  Sodium 135 - 145 mmol/L 136 136 135  Potassium 3.5 - 5.1 mmol/L 3.6 3.6 3.7  Chloride 98 - 111 mmol/L 106 106 106  CO2 22 - 32 mmol/L 26 25 24   Calcium 8.9 - 10.3 mg/dL 9.0 9.2 8.9  Total Protein 6.5 - 8.1 g/dL - 6.6 -  Total Bilirubin 0.3 - 1.2 mg/dL - 0.8 -  Alkaline Phos 38 - 126 U/L - 87 -  AST 15 - 41 U/L - 23 -  ALT 0 - 44 U/L - 25 -      Radiology Studies: DG Pelvis 1-2 Views  Result Date: 11/07/2021 CLINICAL DATA:  Rule out fracture EXAM: PELVIS - 1-2 VIEW COMPARISON:  None. FINDINGS: There is no evidence of acute fracture. Mild bilateral hip osteoarthritis. Decreased femoral head neck offset bilaterally consistent with cam deformities. No focal bone lesion. IMPRESSION: No evidence of acute fracture on single frontal view of the pelvis. Electronically Signed   By: 26 M.D.   On: 11/07/2021 14:27   CT PELVIS WO CONTRAST  Result Date: 11/07/2021 CLINICAL DATA:  Pelvic pain EXAM: CT PELVIS WITHOUT CONTRAST TECHNIQUE: Multidetector CT imaging of the pelvis was performed following the standard protocol without intravenous contrast. RADIATION DOSE REDUCTION: This exam was performed according to the departmental dose-optimization program which includes automated exposure control, adjustment of the mA and/or kV according to patient size and/or use of iterative reconstruction technique. COMPARISON:  11/07/2021 FINDINGS: Urinary Tract: Excreted contrast within the urinary bladder from previous CT. Distal ureters are unremarkable. Bowel: No bowel obstruction or ileus. No bowel wall thickening or inflammatory change. Vascular/Lymphatic: No pathologically enlarged lymph nodes. No significant vascular abnormality seen. Reproductive:  Prostate is unremarkable. Other: No free fluid or free gas. Small fat containing right inguinal hernia unchanged. No bowel herniation. Musculoskeletal: There are no acute displaced fractures. Mild symmetrical bilateral hip osteoarthritis. IMPRESSION: 1. No acute displaced fracture. Mild symmetrical bilateral hip osteoarthritis. 2. Small fat containing right inguinal hernia. Electronically Signed   By: 01/05/2022 M.D.   On: 11/07/2021 19:19   MR THORACIC SPINE W WO CONTRAST  Result Date: 11/07/2021 CLINICAL DATA:  Fall, back pain EXAM: MRI THORACIC WITHOUT AND WITH CONTRAST TECHNIQUE: Multiplanar and  multiecho pulse sequences of the thoracic spine were obtained without and with intravenous contrast. CONTRAST:  55mL GADAVIST GADOBUTROL 1 MMOL/ML IV SOLN COMPARISON:  CT 10/18/2020 FINDINGS: Alignment:  Physiologic. Vertebrae: No fracture, evidence of discitis, or bone lesion. No abnormal enhancement on postcontrast sequences. Cord:  Normal signal and morphology. Paraspinal and other soft tissues: Negative. Disc levels: Negative. Intervertebral discs of the thoracic spine are preserved without disc height loss or focal disc protrusion. Facet joints within normal limits. No foraminal or canal stenosis at any level within the thoracic spine. IMPRESSION: Unremarkable pre and post-contrast MRI of the thoracic spine. Electronically Signed   By: 11m D.O.   On: 11/07/2021 12:40   MR Lumbar Spine W Wo Contrast  Result Date: 11/07/2021 CLINICAL DATA:  Low back pain after fall EXAM: MRI LUMBAR SPINE WITHOUT AND WITH CONTRAST TECHNIQUE: Multiplanar and multiecho pulse sequences of the lumbar spine were obtained without and with intravenous contrast. CONTRAST:  36mL GADAVIST GADOBUTROL 1 MMOL/ML IV SOLN COMPARISON:  CT 05/20/2015 FINDINGS: Segmentation:  Standard. Alignment:  Physiologic. Vertebrae: Acute mild superior endplate compression  fracture of L2 with approximately 10% vertebral body height loss. No retropulsion. No additional fractures. No evidence of discitis. No suspicious bone lesion. No abnormal enhancement on postcontrast sequences. Conus medullaris and cauda equina: Conus extends to the T12-L1 level. Conus and cauda equina appear normal. Paraspinal and other soft tissues: Negative. Disc levels: T12-L1 through L3-L4: Unremarkable. L4-L5: Mild annular disc bulge and mild bilateral facet hypertrophy. Borderline-mild left foraminal stenosis. No canal stenosis. L5-S1: No disc protrusion. Mild bilateral facet arthropathy. No foraminal or canal stenosis. IMPRESSION: 1. Acute mild superior endplate  compression fracture of L2 with approximately 10% vertebral body height loss. No retropulsion. 2. Mild lower lumbar spondylosis with borderline-mild left foraminal stenosis at L4-5. No canal stenosis at any level. Electronically Signed   By: Duanne Guess D.O.   On: 11/07/2021 12:43     Scheduled Meds:  atorvastatin  20 mg Oral Daily   baclofen  10 mg Oral TID   enoxaparin (LOVENOX) injection  40 mg Subcutaneous Q24H   [START ON 11/09/2021] feeding supplement  237 mL Oral BID BM   fluticasone furoate-vilanterol  1 puff Inhalation q AM   gabapentin  250 mg Oral QHS   montelukast  10 mg Oral QHS   riluzole  50 mg Oral BID   senna-docusate  1 tablet Oral QHS   traZODone  100 mg Oral QHS   Continuous Infusions:  lactated ringers 75 mL/hr at 11/08/21 0558     LOS: 1 day   Time spent: 65  Rhetta Mura, MD [facility] To contact the attending provider between 7A-7P or the covering provider during after hours 7P-7A, please log into the web site [email or web address] and access using universal [place] password for that web site. If you do not have the password, please call the hospital operator.  11/08/2021, 3:54 PM

## 2021-11-08 NOTE — Progress Notes (Signed)
Initial Nutrition Assessment  DOCUMENTATION CODES:   Non-severe (moderate) malnutrition in context of chronic illness  INTERVENTION:  - Recommend SLP evaluation for pt report of difficulty swallowing  - Recommend diet liberalization from heart healthy to regular diet to provide more options for adequate PO intake; spoke with MD who prefers to wait until SLP evaluation completed  - Ensure Enlive po BID, each supplement provides 350 kcal and 20 grams of protein (pt prefers chocolate)  NUTRITION DIAGNOSIS:   Moderate Malnutrition related to chronic illness (ALS) as evidenced by mild fat depletion, moderate muscle depletion, severe muscle depletion.  GOAL:   Patient will meet greater than or equal to 90% of their needs  MONITOR:   PO intake, Supplement acceptance, Diet advancement, Labs, Weight trends  REASON FOR ASSESSMENT:   Malnutrition Screening Tool    ASSESSMENT:   Pt admitted from home with back pain after falling and hitting his lower back leading to L2 superior endplate compression fracture . He is also noted to have burning pain in calves and feet. PMH includes ALS, CAD, asthma.  No surgical interventions needed at this time. Currently admitted for pain management.  Pt reports being diagnosed with ALS about 9 months ago. He has been using a walker for mobility and has been getting around well until his fall. His PO intake has remained consistent until his fall. At home he has been eating 3 meals per day with snacks between meals. During his admission, he reports eating minimally d/t pain and emesis x 3 today. He did not eat lunch today but observed [employer] on side table which he plans to eat soon. He is familiar with Ensure as he drinks this at home and is agreeable to receive them during admission.   He reports having difficulty swallowing food and liquids d/t L sided throat weakness. Spoke with MD regarding SLP evaluation.   Pt states that his current weight is 136 lbs  and that he has not had any recent weight loss. Per review of chart, his wait has remained stable at 135 lbs (61.2 kg) since April 2022.  Medications: senna, LR@ 36ml/hr  Labs: reviewed  NUTRITION - FOCUSED PHYSICAL EXAM:  Flowsheet Row Most Recent Value  Orbital Region Mild depletion  Upper Arm Region Mild depletion  Thoracic and Lumbar Region Mild depletion  Buccal Region Mild depletion  Temple Region Mild depletion  Clavicle Bone Region Severe depletion  Clavicle and Acromion Bone Region Severe depletion  Scapular Bone Region Moderate depletion  Dorsal Hand Moderate depletion  Patellar Region Severe depletion  Anterior Thigh Region Severe depletion  Posterior Calf Region Moderate depletion  Edema (RD Assessment) None  Hair Reviewed  Eyes Reviewed  Mouth Reviewed  Skin Reviewed  Nails Reviewed       Diet Order:   Diet Order             Diet Heart Room service appropriate? Yes; Fluid consistency: Thin  Diet effective now                   EDUCATION NEEDS:   No education needs have been identified at this time  Skin:  Skin Assessment: Reviewed RN Assessment  Last BM:  2/5  Height:   Ht Readings from Last 1 Encounters:  11/07/21 5\' 10"  (1.778 m)    Weight:   Wt Readings from Last 1 Encounters:  11/07/21 61.2 kg   BMI:  Body mass index is 19.37 kg/m.  Estimated Nutritional Needs:   Kcal:  1800-2000  Protein:  90-105g  Fluid:  >/=1.8L  Drusilla Kanner, RDN, LDN Clinical Nutrition

## 2021-11-08 NOTE — Progress Notes (Signed)
Physical Therapy Treatment Patient Details Name: Shawn [name] MRN: [id number] DOB: October 21, 1960 Today's Date: 11/08/2021   History of Present Illness Pt is a 61 y.o. male with medical history significant for ALS, CAD, and asthma who presents by EMS after fall. MD assessment includes: Compression fracture of L2 with approximately 10% vertebral body height loss and lumbago.    PT Comments    Pt premedicated with Dilaudid prior to session due to 7/10 lower back pain which decreased with pain meds. [name] BP low in supine, transferred to EOB with MinA and use of rail, non-symptomatic after sitting for 2 minutes. ModA to stand and shuffle a few steps to bedside chair with RW. Pt completed B LE strengthening exercises and passive prolonged stretches to hamstrings/heel cords. Continue to recommend SNF placement once medically stable.    Recommendations for follow up therapy are one component of a multi-disciplinary discharge planning process, led by the attending physician.  Recommendations may be updated based on patient status, additional functional criteria and insurance authorization.  Follow Up Recommendations  Skilled nursing-short term rehab (<3 hours/day)     Assistance Recommended at Discharge Frequent or constant Supervision/Assistance  Patient can return home with the following Two people to help with walking and/or transfers;Two people to help with bathing/dressing/bathroom;Assistance with cooking/housework;Direct supervision/assist for medications management;Direct supervision/assist for financial management;Assist for transportation;Help with stairs or ramp for entrance   Equipment Recommendations  BSC/3in1    Recommendations for Other Services       Precautions / Restrictions Precautions Precautions: Fall;Back Required Braces or Orthoses: Spinal Brace (LSO) Spinal Brace: Lumbar corset Restrictions Weight Bearing Restrictions: No Other Position/Activity Restrictions: LSO with  ambulation     Mobility  Bed Mobility Overal bed mobility: Needs Assistance Bed Mobility: Rolling, Sidelying to Sit, Sit to Sidelying Rolling: Mod assist Sidelying to sit: Mod assist (with use of side rail)       General bed mobility comments: Log roll training provided with mod verbal and tactile cues for sequencing and max A for BLE and trunk control    Transfers Overall transfer level: Needs assistance Equipment used: Rolling walker (2 wheels) Transfers: Sit to/from Stand Sit to Stand: Mod assist           General transfer comment:  (LSO applied once sitting EOB)    Ambulation/Gait Ambulation/Gait assistance: Min assist Gait Distance (Feet): 2 Feet Assistive device: Rolling walker (2 wheels) Gait Pattern/deviations: Shuffle       General Gait Details:  (Pt side stepped to bedside chair with cues for safety)   Stairs             Wheelchair Mobility    Modified Rankin (Stroke Patients Only)       Balance Overall balance assessment: History of Falls                                          Cognition Arousal/Alertness: Awake/alert Behavior During Therapy: WFL for tasks assessed/performed Overall Cognitive Status: Within Functional Limits for tasks assessed                                          Exercises General Exercises - Lower Extremity Ankle Circles/Pumps: AROM, Both, 10 reps, Seated Long Arc Quad: AROM, Both, 10 reps, Seated Hip Flexion/Marching: AROM, Both,  10 reps, Seated Other Exercises Other Exercises: B hamstring and heelcord prolonged stretches in sitting due to c/o tightness    General Comments General comments (skin integrity, edema, etc.):  (Condem cath intact)      Pertinent Vitals/Pain Pain Assessment Pain Assessment: 0-10 Pain Score: 7  Pain Location: low back/groin Pain Descriptors / Indicators: Aching, Sore, Grimacing, Guarding Pain Intervention(s): Monitored during session,  Premedicated before session    Home Living                          Prior Function            PT Goals (current goals can now be found in the care plan section) Acute Rehab PT Goals Patient Stated Goal: Better balance Progress towards PT goals: Progressing toward goals    Frequency    7X/week      PT Plan Current plan remains appropriate    Co-evaluation              AM-PAC PT "6 Clicks" Mobility   Outcome Measure  Help needed turning from your back to your side while in a flat bed without using bedrails?: A Lot Help needed moving from lying on your back to sitting on the side of a flat bed without using bedrails?: A Lot Help needed moving to and from a bed to a chair (including a wheelchair)?: A Lot Help needed standing up from a chair using your arms (e.g., wheelchair or bedside chair)?: A Lot Help needed to walk in hospital room?: Total Help needed climbing 3-5 steps with a railing? : Total 6 Click Score: 10    End of Session Equipment Utilized During Treatment: Gait belt Activity Tolerance: Patient limited by pain Patient left: in chair;with call bell/phone within reach Nurse Communication: Mobility status;Precautions PT Visit Diagnosis: Unsteadiness on feet (R26.81);History of falling (Z91.81);Difficulty in walking, not elsewhere classified (R26.2);Pain Pain - part of body:  (lower back)     Time: 8099-8338 PT Time Calculation (min) (ACUTE ONLY): 28 min  Charges:  $Therapeutic Exercise: 8-22 mins $Therapeutic Activity: 8-22 mins          Zadie Cleverly, PTA   Jannet Askew 11/08/2021, 12:13 PM

## 2021-11-08 NOTE — Progress Notes (Addendum)
Pt was given compazine for n/v. Approx 15 min after compazine pt felt flushed and chest discomfort. Vitals taken. Heart rate elevated. Approx 10 minutes after reports of discomfort pt reported relief. EKG obtained Provider aware no changes from previous EKG and symptoms resolved  11/07/21 2333 11/07/21 2340  Assess: MEWS Score  Temp 98.3 F (36.8 C) 98.9 F (37.2 C)  BP (!) 149/75 119/78  Pulse Rate (!) 124 94  Resp 18 16  SpO2 94 % 95 %  O2 Device Room Air Room Air  Assess: MEWS Score  MEWS Temp 0 0  MEWS Systolic 0 0  MEWS Pulse 2 0  MEWS RR 0 0  MEWS LOC 0 0  MEWS Score 2 0  MEWS Score Color Yellow Green  Assess: if the MEWS score is Yellow or Red  Were vital signs taken at a resting state? Yes  --   Focused Assessment Change from prior assessment (see assessment flowsheet)  --   Does the patient meet 2 or more of the SIRS criteria? No  --   MEWS guidelines implemented *See Row Information* No, vital signs rechecked  --   Treat  MEWS Interventions Other (Comment) (cool rag placed laid pt hob lower.)  --   Notify: Charge Nurse/RN  Name of Charge Nurse/RN Notified Olivia Rn  --   Date Charge Nurse/RN Notified 11/07/21  --   Time Charge Nurse/RN Notified 2340  --   Notify: Provider  Provider Name/Title K. Foust NP  --   Date Provider Notified 11/07/21  --   Time Provider Notified 2342  --   Notification Type  (amion)  --   Notification Reason Change in status  --   Provider response See new orders (EKG)  --   Date of Provider Response 11/07/21  --   Time of Provider Response 2350  --   Document  Patient Outcome Stabilized after interventions  --   Progress note created (see row info) Yes  --   Assess: SIRS CRITERIA  SIRS Temperature  0 0  SIRS Pulse 1 1  SIRS Respirations  0 0  SIRS WBC 0 0  SIRS Score Sum  1 1

## 2021-11-08 NOTE — Plan of Care (Signed)
Pt is alert and oriented x 4. received 2 doses of pain medication this shift. Pt did refuse some hs meds last night due to nausea/vomiting then effects of compazine made pt refuse the rest. Pt was medicated this am with oxycodone. TLSO brace at bedside. Condom cath placed due to pt did not like the male purewick due to leaking Problem: Education: Goal: Knowledge of General Education information will improve Description: Including pain rating scale, medication(s)/side effects and non-pharmacologic comfort measures Outcome: Progressing   Problem: Health Behavior/Discharge Planning: Goal: Ability to manage health-related needs will improve Outcome: Progressing   Problem: Clinical Measurements: Goal: Ability to maintain clinical measurements within normal limits will improve Outcome: Progressing Goal: Will remain free from infection Outcome: Progressing Goal: Diagnostic test results will improve Outcome: Progressing Goal: Respiratory complications will improve Outcome: Progressing Goal: Cardiovascular complication will be avoided Outcome: Progressing   Problem: Activity: Goal: Risk for activity intolerance will decrease Outcome: Progressing   Problem: Nutrition: Goal: Adequate nutrition will be maintained Outcome: Progressing   Problem: Elimination: Goal: Will not experience complications related to bowel motility Outcome: Progressing Goal: Will not experience complications related to urinary retention Outcome: Progressing   Problem: Pain Managment: Goal: General experience of comfort will improve Outcome: Progressing   Problem: Safety: Goal: Ability to remain free from injury will improve Outcome: Progressing

## 2021-11-09 DIAGNOSIS — G1221 Amyotrophic lateral sclerosis: Secondary | ICD-10-CM

## 2021-11-09 LAB — CBC WITH DIFFERENTIAL/PLATELET
Abs Immature Granulocytes: 0.02 10*3/uL (ref 0.00–0.07)
Basophils Absolute: 0 10*3/uL (ref 0.0–0.1)
Basophils Relative: 1 %
Eosinophils Absolute: 0.1 10*3/uL (ref 0.0–0.5)
Eosinophils Relative: 1 %
HCT: 35.1 % — ABNORMAL LOW (ref 39.0–52.0)
Hemoglobin: 11.9 g/dL — ABNORMAL LOW (ref 13.0–17.0)
Immature Granulocytes: 0 %
Lymphocytes Relative: 42 %
Lymphs Abs: 2.2 10*3/uL (ref 0.7–4.0)
MCH: 34.2 pg — ABNORMAL HIGH (ref 26.0–34.0)
MCHC: 33.9 g/dL (ref 30.0–36.0)
MCV: 100.9 fL — ABNORMAL HIGH (ref 80.0–100.0)
Monocytes Absolute: 0.4 10*3/uL (ref 0.1–1.0)
Monocytes Relative: 8 %
Neutro Abs: 2.6 10*3/uL (ref 1.7–7.7)
Neutrophils Relative %: 48 %
Platelets: 192 10*3/uL (ref 150–400)
RBC: 3.48 MIL/uL — ABNORMAL LOW (ref 4.22–5.81)
RDW: 12.3 % (ref 11.5–15.5)
WBC: 5.4 10*3/uL (ref 4.0–10.5)
nRBC: 0 % (ref 0.0–0.2)

## 2021-11-09 LAB — COMPREHENSIVE METABOLIC PANEL
ALT: 20 U/L (ref 0–44)
AST: 20 U/L (ref 15–41)
Albumin: 3.3 g/dL — ABNORMAL LOW (ref 3.5–5.0)
Alkaline Phosphatase: 76 U/L (ref 38–126)
Anion gap: 5 (ref 5–15)
BUN: 9 mg/dL (ref 8–23)
CO2: 26 mmol/L (ref 22–32)
Calcium: 9 mg/dL (ref 8.9–10.3)
Chloride: 107 mmol/L (ref 98–111)
Creatinine, Ser: 0.74 mg/dL (ref 0.61–1.24)
GFR, Estimated: >60 mL/min (ref >60)
Glucose, Bld: 105 mg/dL — ABNORMAL HIGH (ref 70–99)
Potassium: 3.7 mmol/L (ref 3.5–5.1)
Sodium: 138 mmol/L (ref 135–145)
Total Bilirubin: 0.8 mg/dL (ref 0.3–1.2)
Total Protein: 5.9 g/dL — ABNORMAL LOW (ref 6.5–8.1)

## 2021-11-09 MED ORDER — SENNOSIDES-DOCUSATE SODIUM 8.6-50 MG PO TABS: 1.0000 | ORAL_TABLET | Freq: Every day | ORAL | 0 refills | Status: DC

## 2021-11-09 MED ORDER — OXYCODONE HCL 5 MG PO TABS: 5.0000 mg | ORAL_TABLET | ORAL | 0 refills | Status: DC | PRN

## 2021-11-09 NOTE — TOC Initial Note (Signed)
Transition of Care [facility]) - Initial/Assessment Note    Patient Details  Name: Shawn [name] MRN: [id number] Date of Birth: [birth year]  Transition of Care [facility]) CM/SW Contact:    Maree Krabbe, LCSW Phone Number: 11/09/2021, 10:45 AM  Clinical Narrative:  Pt is agreeable to SNF placement. Pt would like to go to [facility] or [facility]. Referral sent. CSW to reach out to SNF to indicate review.                Expected Discharge Plan: Skilled Nursing Facility Barriers to Discharge: Continued Medical Work up   Patient Goals and CMS Choice Patient states their goals for this hospitalization and ongoing recovery are:: to get better   Choice offered to / list presented to : Patient  Expected Discharge Plan and Services Expected Discharge Plan: Skilled Nursing Facility In-house Referral: Clinical Social Work   Post Acute Care Choice: Skilled Nursing Facility, [place] Living arrangements for the past 2 months: Single Family Home                                      Prior Living Arrangements/Services Living arrangements for the past 2 months: Single Family Home Lives with:: Self Patient language and need for interpreter reviewed:: Yes Do you feel safe going back to the place where you live?: Yes      Need for Family Participation in Patient Care: Yes (Comment) Care giver support system in place?: Yes (comment)   Criminal Activity/Legal Involvement Pertinent to Current Situation/Hospitalization: No - Comment as needed  Activities of Daily Living Home Assistive Devices/Equipment: Walker (specify type) ADL Screening (condition at time of admission) Patient's cognitive ability adequate to safely complete daily activities?: Yes Is the patient deaf or have difficulty hearing?: No Does the patient have difficulty seeing, even when wearing glasses/contacts?: No Does the patient have difficulty concentrating, remembering, or making decisions?: No Patient able to express need for  assistance with ADLs?: Yes Does the patient have difficulty dressing or bathing?: No Independently performs ADLs?: Yes (appropriate for developmental age) Does the patient have difficulty walking or climbing stairs?: Yes Weakness of Legs: Both Weakness of Arms/Hands: Both  Permission Sought/Granted Permission sought to share information with : Family Supports    Share Information with NAME: Ollen Gross     Permission granted to share info w Relationship: mother     Emotional Assessment Appearance:: Appears stated age Attitude/Demeanor/Rapport: Engaged Affect (typically observed): Accepting Orientation: : Oriented to Self, Oriented to Place, Oriented to  Time, Oriented to Situation Alcohol / Substance Use: Not Applicable Psych Involvement: No (comment)  Admission diagnosis:  Compression fracture of L2 (HCC) [S32.020A] Compression fracture of L2 vertebra, initial encounter (HCC) [S32.020A] Patient Active Problem List   Diagnosis Date Noted   Compression fracture of L2 (HCC) 11/07/2021   Lumbago 11/07/2021   Pain in both lower legs 11/07/2021   ALS (amyotrophic lateral sclerosis) (HCC) 11/07/2021   Asthma, chronic 11/07/2021   Ureteral stone 05/20/2015   PCP:  [facility] Pharmacy:   [phone] Cl[facility] - [place], [place] - [address] [place] [address] Phone: [phone] Fax: [phone]  [id number] [place], [place] - [address] [place] [address] Phone: [phone] Fax: [phone]     Social Determinants of Health (SDOH) Interventions    Readmission Risk Interventions No flowsheet  data found.

## 2021-11-09 NOTE — Progress Notes (Signed)
SLP Cancellation Note  Patient Details Name: Shawn [name] MRN: [id number] DOB: September 21, 1961   Cancelled treatment:       Reason Eval/Treat Not Completed: SLP screened, no needs identified, will sign off (chart reviewed; consulted NSG and met w/ pt)  [name] is a 61 y.o. male with medical history significant for ALS(followed by [facility]), CAD, Asthma who presents by EMS after fall at home on 11/07/2021. Reported he lives alone and was ambulating with his walker when he lost his balance and fell backward hitting his low back on the floor. He had no reported head trauma or loss of consciousness. He stayed in the floor for a few hours and was eventually able to crawl to his bed and called for EMS. In speaking w/ him, he is not having any change from his Baseline swallowing -- he eats a "regular" diet and cuts the foods small, moistens them. He reported he eats "soups" at home that he can cook ahead of time then freeze for the week. Noted he cleaned his plate at this Breakfast meal stating no trouble w/ the eggs, toast, and thin liquids as long as he took small bites/sips -- again, Baseline. [name] Baseline vocal quality and speech pattern appear impacted by the ALS; pt stated his speech is "slow" as is the swallowing. Vocal quality min tremorous w/ low volume d/t the decreased breath support for speech(again, impact from ALS).  Per MD notes this admit:  LUNGS - CTA B no rales no rhonchi.    He is followed by the [place] [facility] at [facility]. He just had his every 3 mos appt. On 11/01/2021 and is scheduled to go back to f/u w/ ENT d/t "closure" or tightness of something in his "throat". He denied any impact during the breakfast meal of eggs and toast and liquids as long as he took his time. Briefly discussed general aspiration precautions including sitting Up more for better/safer swallowing -- pt stated he could "not" d/t his back pain. Encouraged him to work w/ PT for sitting and mobility; goals. Recommend he  f/u w/ the ALS clinic as scheduled/planned for anything further re: his swallowing d/t the nature/specialty of ALS.  Aspiration precs placed on board including Pill swallowing for safer swallowing while admitted. Discussed easy to eat/chew food items, menu w/ pt but pt stated he was "picky" and that he would order "later" when is Sister arrived. No Acute ST needs indicated currentlly, MD to reconsult if new needs arise during admit. NSG/CM updated.      Orinda Kenner, MS, CCC-SLP Speech Language Pathologist Rehab Services; [place] [phone] (ascom) Asti Mackley 11/09/2021, 10:12 AM

## 2021-11-09 NOTE — TOC Transition Note (Addendum)
Transition of Care [facility]) - CM/SW Discharge Note   Patient Details  Name: [name] MRN: [id number] Date of Birth: 08-01-1961  Transition of Care [facility]) CM/SW Contact:  Maree Krabbe, LCSW Phone Number: 11/09/2021, 11:17 AM   Clinical Narrative:   Clinical Social Worker facilitated patient discharge including contacting patient family and facility to confirm patient discharge plans.  Clinical information faxed to facility and family agreeable with plan.  CSW arranged ambulance transport via [facility] to [facility] (room 806).  RN to call [phone] for report prior to discharge.      Final next level of care: Skilled Nursing Facility Barriers to Discharge: No Barriers Identified   Patient Goals and CMS Choice Patient states their goals for this hospitalization and ongoing recovery are:: to get better   Choice offered to / list presented to : Patient  Discharge Placement              Patient chooses bed at:  ([facility]) Patient to be transferred to facility by: [facility]   Patient and family notified of of transfer: 11/09/21  Discharge Plan and Services In-house Referral: Clinical Social Work   Post Acute Care Choice: Skilled Nursing Facility, [place]                               Social Determinants of Health (SDOH) Interventions     Readmission Risk Interventions No flowsheet data found.

## 2021-11-09 NOTE — NC FL2 (Signed)
[place] MEDICAID FL2 LEVEL OF CARE SCREENING TOOL     IDENTIFICATION  Patient Name: Shawn [name] Birthdate: Aug 01, 1961 Sex: male Admission Date (Current Location): 11/07/2021  [facility] and [place] Number:  Chiropodist and Address:  [facility], [address], [place], [place] [address]      Provider Number: [id number]  Attending Physician Name and Address:  Glade Lloyd, MD  Relative Name and Phone Number:       Current Level of Care: Hospital Recommended Level of Care: Skilled Nursing Facility Prior Approval Number:    Date Approved/Denied:   PASRR Number: [id number]  Discharge Plan: SNF    Current Diagnoses: Patient Active Problem List   Diagnosis Date Noted   Compression fracture of L2 (HCC) 11/07/2021   Lumbago 11/07/2021   Pain in both lower legs 11/07/2021   ALS (amyotrophic lateral sclerosis) (HCC) 11/07/2021   Asthma, chronic 11/07/2021   Ureteral stone 05/20/2015    Orientation RESPIRATION BLADDER Height & Weight     Self, Situation, Time, Place  Normal Continent Weight: 135 lb (61.2 kg) Height:  5\' 10"  (177.8 cm)  BEHAVIORAL SYMPTOMS/MOOD NEUROLOGICAL BOWEL NUTRITION STATUS      Continent Diet (heart healthy, thin liquids)  AMBULATORY STATUS COMMUNICATION OF NEEDS Skin   Limited Assist Verbally Normal                       Personal Care Assistance Level of Assistance  Dressing, Feeding, Bathing Bathing Assistance: Limited assistance Feeding assistance: Independent Dressing Assistance: Limited assistance     Functional Limitations Info  Sight, Hearing, Speech Sight Info: Adequate Hearing Info: Adequate Speech Info: Adequate    SPECIAL CARE FACTORS FREQUENCY  PT (By licensed PT), OT (By licensed OT)     PT Frequency: 5x OT Frequency: 5x            Contractures Contractures Info: Not present    Additional Factors Info  Code Status, Allergies Code Status Info: full code Allergies  Info: Aspirin, Compazine (Prochlorperazine), Morphine And Related, Naproxen, Penicillins, Tramadol           Current Medications (11/09/2021):  This is the current hospital active medication list Current Facility-Administered Medications  Medication Dose Route Frequency Provider Last Rate Last Admin   acetaminophen (TYLENOL) tablet 650 mg  650 mg Oral Q6H PRN Chotiner, 01/07/2022, MD       Or   acetaminophen (TYLENOL) suppository 650 mg  650 mg Rectal Q6H PRN Chotiner, Claudean Severance, MD       albuterol (PROVENTIL) (2.5 MG/3ML) 0.083% nebulizer solution 3 mL  3 mL Inhalation Q4H PRN Chotiner, Claudean Severance, MD       atorvastatin (LIPITOR) tablet 20 mg  20 mg Oral Daily Chotiner, Claudean Severance, MD   20 mg at 11/09/21 0904   baclofen (LIORESAL) tablet 10 mg  10 mg Oral TID Chotiner, 01/07/22, MD   10 mg at 11/09/21 0904   enoxaparin (LOVENOX) injection 40 mg  40 mg Subcutaneous Q24H Chotiner, 01/07/22, MD   40 mg at 11/08/21 1832   feeding supplement (ENSURE ENLIVE / ENSURE PLUS) liquid 237 mL  237 mL Oral BID BM Samtani, Jai-Gurmukh, MD   237 mL at 11/09/21 0904   fluticasone furoate-vilanterol (BREO ELLIPTA) 100-25 MCG/ACT 1 puff  1 puff Inhalation q AM Chotiner, 01/07/22, MD   1 puff at 11/09/21 0607   gabapentin (NEURONTIN) 250 MG/5ML solution 250 mg  250 mg Oral  QHS Chotiner, Claudean Severance, MD   250 mg at 11/08/21 2005   HYDROmorphone (DILAUDID) injection 1 mg  1 mg Intravenous Q4H PRN Rhetta Mura, MD   1 mg at 11/09/21 0905   montelukast (SINGULAIR) tablet 10 mg  10 mg Oral QHS Chotiner, Claudean Severance, MD   10 mg at 11/08/21 2004   ondansetron (ZOFRAN-ODT) disintegrating tablet 4 mg  4 mg Oral Q8H PRN Rhetta Mura, MD       oxyCODONE (Oxy IR/ROXICODONE) immediate release tablet 5 mg  5 mg Oral Q6H PRN Chotiner, Claudean Severance, MD   5 mg at 11/09/21 0606   riluzole (RILUTEK) tablet 50 mg  50 mg Oral BID Chotiner, Claudean Severance, MD       senna-docusate (Senokot-S) tablet 1 tablet  1 tablet Oral QHS  Chotiner, Claudean Severance, MD   1 tablet at 11/08/21 2004   traZODone (DESYREL) tablet 100 mg  100 mg Oral QHS Chotiner, Claudean Severance, MD   100 mg at 11/08/21 2004     Discharge Medications: Please see discharge summary for a list of discharge medications.  Relevant Imaging Results:  Relevant Lab Results:   Additional Information SSN:[id number]  Maree Krabbe, LCSW

## 2021-11-09 NOTE — Progress Notes (Signed)
Report called to Victorino Dike, RN at [facility]. EMS transport Arranged.

## 2021-11-09 NOTE — Plan of Care (Signed)
Pt alert and oriented. Pt continues to use condom cath. Received 2 dose of prn pain medication. 1 this am for prep for PT. Oxy given this am due to soft bp pt is asymptomatic.  Problem: Education: Goal: Knowledge of General Education information will improve Description: Including pain rating scale, medication(s)/side effects and non-pharmacologic comfort measures Outcome: Progressing   Problem: Health Behavior/Discharge Planning: Goal: Ability to manage health-related needs will improve Outcome: Progressing   Problem: Clinical Measurements: Goal: Ability to maintain clinical measurements within normal limits will improve Outcome: Progressing Goal: Will remain free from infection Outcome: Progressing Goal: Diagnostic test results will improve Outcome: Progressing Goal: Respiratory complications will improve Outcome: Progressing Goal: Cardiovascular complication will be avoided Outcome: Progressing   Problem: Activity: Goal: Risk for activity intolerance will decrease Outcome: Progressing   Problem: Nutrition: Goal: Adequate nutrition will be maintained Outcome: Progressing   Problem: Elimination: Goal: Will not experience complications related to bowel motility Outcome: Progressing Goal: Will not experience complications related to urinary retention Outcome: Progressing   Problem: Pain Managment: Goal: General experience of comfort will improve Outcome: Progressing   Problem: Safety: Goal: Ability to remain free from injury will improve Outcome: Progressing

## 2021-11-09 NOTE — Discharge Summary (Signed)
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  MRI showed L2 superior endplate compression fracture.  Neurosurgery recommended conservative management.  PT recommended SNF placement.  He will be discharged to SNF once bed is available.  Discharge Diagnoses and hospital course:  Compression fracture L2 after mechanical fall causing severe lower back pain -Neurosurgery recommended conservative management with back brace when ambulating out of bed.  Outpatient follow-up with neurosurgery. -Continue pain management.  Fall precautions. -PT recommended SNF placement.  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 -Continue baclofen, gabapentin and riluzole   ?  Iatrogenic side effect of Compazine -Compazine discontinued by prior provider as patient complained of feeling flushed due to it.   Anemia of chronic disease -Possibly from chronic illnesses including ALS.  Hemoglobin stable   Hyperlipidemia -Continue statin   Moderate malnutrition -Follow nutrition recommendations   Consultants: Neurosurgery Procedures performed: None Disposition: Skilled nursing facility Diet recommendation:  Discharge Diet Orders (From admission, onward)      Start     Ordered   11/09/21 0000  Diet general        11/09/21 1058           Regular diet  DISCHARGE MEDICATION: Allergies as of 11/09/2021       Reactions   Aspirin Other (See Comments), Itching, Shortness Of Breath, Swelling   Upset stomach  GI Upset   Compazine [prochlorperazine] Other (See Comments)   Added per pt. Pt had tachycardia chest discomfort   Morphine And Related Itching   Naproxen Other (See Comments)   Upset stomach  GI Upset   Penicillins Rash   Tramadol Itching        Medication List     TAKE these medications    acetaminophen 160 MG/5ML solution Commonly known as: TYLENOL Take 480 mg by mouth every 6 (six) hours as needed for mild pain or moderate pain.   albuterol 108 (90 Base) MCG/ACT inhaler Commonly known as: VENTOLIN HFA Inhale 1-2 puffs into the lungs every 6 (six) hours as needed for wheezing or shortness of breath.   atorvastatin 20 MG tablet Commonly known as: LIPITOR Take 20 mg by mouth daily.   Baclofen 5 MG Tabs Take 10 mg by mouth 3 (three) times daily.   fluticasone furoate-vilanterol 100-25 MCG/ACT Aepb Commonly known as: BREO ELLIPTA Inhale 1 puff into the lungs in the morning.   gabapentin 250 MG/5ML solution Commonly known as: NEURONTIN Take 250 mg by mouth at bedtime.   montelukast 10 MG tablet Commonly known as: SINGULAIR Take 10 mg by mouth at bedtime.   oxyCODONE 5 MG immediate release tablet Commonly known as: Oxy IR/ROXICODONE Take 1 tablet (5 mg total) by mouth every 4 (four) hours as needed for moderate pain.   riluzole 50 MG tablet Commonly known  as: RILUTEK Take 50 mg by mouth 2 (two) times daily.   senna-docusate 8.6-50 MG tablet Commonly known as: Senokot-S Take 1 tablet by mouth at bedtime.   traZODone 100 MG tablet Commonly known as: DESYREL Take 100 mg by mouth at bedtime.        Follow-up Information     Venetia NightYarbrough, [place], MD Follow up in 2 week(s).   Specialty:  Neurosurgery Contact information: [address] [place] [address] [phone]                Subjective: Patient seen and examined at bedside.  States that his pain is manageable with current pain medications.  No overnight fever, nausea or vomiting reported.  Discharge Exam: Filed Weights   11/07/21 0925  Weight: 61.2 kg   General exam: Appears calm and comfortable.  Looks chronically ill and deconditioned.  Currently on room air.  Slow to respond, speech is slow. Respiratory system: Bilateral decreased breath sounds at bases Cardiovascular system: S1 & S2 heard, Rate controlled Gastrointestinal system: Abdomen is nondistended, soft and nontender. Normal bowel sounds heard. Extremities: No cyanosis, clubbing, edema     Condition at discharge: fair  The results of significant diagnostics from this hospitalization (including imaging, microbiology, ancillary and laboratory) are listed below for reference.   Imaging Studies: DG Pelvis 1-2 Views  Result Date: 11/07/2021 CLINICAL DATA:  Rule out fracture EXAM: PELVIS - 1-2 VIEW COMPARISON:  None. FINDINGS: There is no evidence of acute fracture. Mild bilateral hip osteoarthritis. Decreased femoral head neck offset bilaterally consistent with cam deformities. No focal bone lesion. IMPRESSION: No evidence of acute fracture on single frontal view of the pelvis. Electronically Signed   By: Caprice RenshawJacob  Kahn M.D.   On: 11/07/2021 14:27   CT PELVIS WO CONTRAST  Result Date: 11/07/2021 CLINICAL DATA:  Pelvic pain EXAM: CT PELVIS WITHOUT CONTRAST TECHNIQUE: Multidetector CT imaging of the pelvis was performed following the standard protocol without intravenous contrast. RADIATION DOSE REDUCTION: This exam was performed according to the departmental dose-optimization program which includes automated exposure control, adjustment of the mA and/or kV according to patient size and/or use of iterative reconstruction technique. COMPARISON:   11/07/2021 FINDINGS: Urinary Tract: Excreted contrast within the urinary bladder from previous CT. Distal ureters are unremarkable. Bowel: No bowel obstruction or ileus. No bowel wall thickening or inflammatory change. Vascular/Lymphatic: No pathologically enlarged lymph nodes. No significant vascular abnormality seen. Reproductive:  Prostate is unremarkable. Other: No free fluid or free gas. Small fat containing right inguinal hernia unchanged. No bowel herniation. Musculoskeletal: There are no acute displaced fractures. Mild symmetrical bilateral hip osteoarthritis. IMPRESSION: 1. No acute displaced fracture. Mild symmetrical bilateral hip osteoarthritis. 2. Small fat containing right inguinal hernia. Electronically Signed   By: Sharlet SalinaMichael  Brown M.D.   On: 11/07/2021 19:19   MR THORACIC SPINE W WO CONTRAST  Result Date: 11/07/2021 CLINICAL DATA:  Fall, back pain EXAM: MRI THORACIC WITHOUT AND WITH CONTRAST TECHNIQUE: Multiplanar and multiecho pulse sequences of the thoracic spine were obtained without and with intravenous contrast. CONTRAST:  6mL GADAVIST GADOBUTROL 1 MMOL/ML IV SOLN COMPARISON:  CT 10/18/2020 FINDINGS: Alignment:  Physiologic. Vertebrae: No fracture, evidence of discitis, or bone lesion. No abnormal enhancement on postcontrast sequences. Cord:  Normal signal and morphology. Paraspinal and other soft tissues: Negative. Disc levels: Negative. Intervertebral discs of the thoracic spine are preserved without disc height loss or focal disc protrusion. Facet joints within normal limits. No foraminal or canal stenosis at any level within  the thoracic spine. IMPRESSION: Unremarkable pre and post-contrast MRI of the thoracic spine. Electronically Signed   By: Duanne Guess D.O.   On: 11/07/2021 12:40   MR Lumbar Spine W Wo Contrast  Result Date: 11/07/2021 CLINICAL DATA:  Low back pain after fall EXAM: MRI LUMBAR SPINE WITHOUT AND WITH CONTRAST TECHNIQUE: Multiplanar and multiecho pulse sequences  of the lumbar spine were obtained without and with intravenous contrast. CONTRAST:  4mL GADAVIST GADOBUTROL 1 MMOL/ML IV SOLN COMPARISON:  CT 05/20/2015 FINDINGS: Segmentation:  Standard. Alignment:  Physiologic. Vertebrae: Acute mild superior endplate compression fracture of L2 with approximately 10% vertebral body height loss. No retropulsion. No additional fractures. No evidence of discitis. No suspicious bone lesion. No abnormal enhancement on postcontrast sequences. Conus medullaris and cauda equina: Conus extends to the T12-L1 level. Conus and cauda equina appear normal. Paraspinal and other soft tissues: Negative. Disc levels: T12-L1 through L3-L4: Unremarkable. L4-L5: Mild annular disc bulge and mild bilateral facet hypertrophy. Borderline-mild left foraminal stenosis. No canal stenosis. L5-S1: No disc protrusion. Mild bilateral facet arthropathy. No foraminal or canal stenosis. IMPRESSION: 1. Acute mild superior endplate compression fracture of L2 with approximately 10% vertebral body height loss. No retropulsion. 2. Mild lower lumbar spondylosis with borderline-mild left foraminal stenosis at L4-5. No canal stenosis at any level. Electronically Signed   By: Duanne Guess D.O.   On: 11/07/2021 12:43    Microbiology: Results for orders placed or performed during the hospital encounter of 11/07/21  Resp Panel by RT-PCR (Flu A&B, Covid) Nasopharyngeal Swab     Status: None   Collection Time: 11/07/21 11:18 AM   Specimen: Nasopharyngeal Swab; Nasopharyngeal(NP) swabs in vial transport medium  Result Value Ref Range Status   SARS Coronavirus 2 by RT PCR NEGATIVE NEGATIVE Final    Comment: (NOTE) SARS-CoV-2 target nucleic acids are NOT DETECTED.  The SARS-CoV-2 RNA is generally detectable in upper respiratory specimens during the acute phase of infection. The lowest concentration of SARS-CoV-2 viral copies this assay can detect is 138 copies/mL. A negative result does not preclude  SARS-Cov-2 infection and should not be used as the sole basis for treatment or other patient management decisions. A negative result may occur with  improper specimen collection/handling, submission of specimen other than nasopharyngeal swab, presence of viral mutation(s) within the areas targeted by this assay, and inadequate number of viral copies(<138 copies/mL). A negative result must be combined with clinical observations, patient history, and epidemiological information. The expected result is Negative.  Fact Sheet for Patients:  BloggerCourse.com  Fact Sheet for Healthcare Providers:  SeriousBroker.it  This test is no t yet approved or cleared by the [place] FDA and  has been authorized for detection and/or diagnosis of SARS-CoV-2 by FDA under an Emergency Use Authorization (EUA). This EUA will remain  in effect (meaning this test can be used) for the duration of the COVID-19 declaration under Section 564(b)(1) of the Act, 21 U.S.C.section 360bbb-3(b)(1), unless the authorization is terminated  or revoked sooner.       Influenza A by PCR NEGATIVE NEGATIVE Final   Influenza B by PCR NEGATIVE NEGATIVE Final    Comment: (NOTE) The Xpert Xpress SARS-CoV-2/FLU/RSV plus assay is intended as an aid in the diagnosis of influenza from Nasopharyngeal swab specimens and should not be used as a sole basis for treatment. Nasal washings and aspirates are unacceptable for Xpert Xpress SARS-CoV-2/FLU/RSV testing.  Fact Sheet for Patients: BloggerCourse.com  Fact Sheet for Healthcare Providers: SeriousBroker.it  This test is  not yet approved or cleared by the [place] and has been authorized for detection and/or diagnosis of SARS-CoV-2 by FDA under an Emergency Use Authorization (EUA). This EUA will remain in effect (meaning this test can be used) for the duration of  the COVID-19 declaration under Section 564(b)(1) of the Act, 21 U.S.C. section 360bbb-3(b)(1), unless the authorization is terminated or revoked.  Performed at [facility], [address] [place], [place] [address]     Labs: CBC: Recent Labs  Lab 11/07/21 [phone] 11/08/21 0327 11/09/21 0405  WBC 7.3 6.7 5.4  NEUTROABS 4.8  --  2.6  HGB 12.5* 11.5* 11.9*  HCT 37.3* 34.2* 35.1*  MCV 100.8* 100.9* 100.9*  PLT 223 204 192   Basic Metabolic Panel: Recent Labs  Lab 11/07/21 0931 11/08/21 0327 11/09/21 0405  NA 136 136 138  K 3.6 3.6 3.7  CL 106 106 107  CO2 25 26 26   GLUCOSE 106* 95 105*  BUN 15 13 9   CREATININE 0.74 0.65 0.74  CALCIUM 9.2 9.0 9.0   Liver Function Tests: Recent Labs  Lab 11/07/21 0931 11/09/21 0405  AST 23 20  ALT 25 20  ALKPHOS 87 76  BILITOT 0.8 0.8  PROT 6.6 5.9*  ALBUMIN 4.0 3.3*   CBG: No results for input(s): GLUCAP in the last 168 hours.  Discharge time spent: greater than 30 minutes.  Signed: Glade Lloyd, MD [facility] 11/09/2021

## 2021-11-09 NOTE — Progress Notes (Addendum)
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  MRI showed L2 superior endplate compression fracture.  Neurosurgery recommended conservative management.  PT recommended SNF placement.   Assessment & Plan:   Compression fracture L2 after mechanical fall causing severe lower back pain -Neurosurgery recommended conservative management with back brace when ambulating out of bed.  Outpatient follow-up with neurosurgery. -Continue pain management.  Fall precautions. -PT recommended SNF placement.  Social worker consult.  ALS Moderate to severe mixed dysarthria with dysphagia -[facility] -Continue baclofen, gabapentin and riluzole  ?  Iatrogenic side effect of Compazine -Compazine discontinued by prior provider as patient complained of feeling flushed due to it.  Anemia of chronic disease -Possibly from chronic illnesses including ALS.  Hemoglobin stable  Hyperlipidemia -Continue statin  Moderate malnutrition -Follow nutrition recommendations   DVT prophylaxis: Lovenox Code Status: Full Family Communication: None at bedside Disposition Plan: Status is: Inpatient Remains inpatient appropriate because: Will need SNF placement Currently medically stable for discharge.  Consultants: Neurosurgery  Procedures: None  Antimicrobials: None   Subjective: Patient seen and examined at bedside.  States that his pain is manageable with current pain medications.  No overnight fever, nausea or vomiting  reported.  Objective: Vitals:   11/08/21 1516 11/08/21 2013 11/09/21 0427 11/09/21 0736  BP: 108/69 113/68 (!) 92/59 100/66  Pulse: 64 77 (!) 56 (!) 58  Resp: 16 16 16 18   Temp: 98.3 F (36.8 C) 98 F (36.7 C) 98.6 F (37 C) 98.8 F (37.1 C)  TempSrc:      SpO2: 95% 93% 95% 94%  Weight:      Height:        Intake/Output Summary (Last 24 hours) at 11/09/2021 1034 Last data filed at 11/09/2021 0900 Gross per 24 hour  Intake 0 ml  Output 1050 ml  Net -1050 ml   Filed Weights   11/07/21 0925  Weight: 61.2 kg    Examination:  General exam: Appears calm and comfortable.  Looks chronically ill and deconditioned.  Currently on room air.  Slow to respond, speech is slow. Respiratory system: Bilateral decreased breath sounds at bases Cardiovascular system: S1 & S2 heard, Rate controlled Gastrointestinal system: Abdomen is nondistended, soft and nontender. Normal bowel sounds heard. Extremities: No cyanosis, clubbing, edema    Data Reviewed: I have personally reviewed following labs and imaging studies  CBC: Recent Labs  Lab 11/07/21 0931 11/08/21 0327 11/09/21 0405  WBC 7.3 6.7 5.4  NEUTROABS 4.8  --  2.6  HGB 12.5* 11.5* 11.9*  HCT 37.3* 34.2* 35.1*  MCV 100.8* 100.9* 100.9*  PLT 223 204 192   Basic Metabolic Panel: Recent Labs  Lab 11/07/21 0931 11/08/21 0327 11/09/21 0405  NA 136 136 138  K 3.6 3.6 3.7  CL 106 106 107  CO2 25 26 26   GLUCOSE 106* 95 105*  BUN 15 13 9   CREATININE 0.74 0.65 0.74  CALCIUM 9.2 9.0 9.0   GFR: Estimated Creatinine Clearance: 82.9 mL/min (by C-G formula based on SCr of 0.74 mg/dL). Liver Function Tests: Recent Labs  Lab 11/07/21  9622 11/09/21 0405  AST 23 20  ALT 25 20  ALKPHOS 87 76  BILITOT 0.8 0.8  PROT 6.6 5.9*  ALBUMIN 4.0 3.3*   No results for input(s): LIPASE, AMYLASE in the last 168 hours. No results for input(s): AMMONIA in the last 168 hours. Coagulation Profile: No results for input(s): INR, PROTIME in  the last 168 hours. Cardiac Enzymes: Recent Labs  Lab 11/07/21 0931  CKTOTAL 79   BNP (last 3 results) No results for input(s): PROBNP in the last 8760 hours. HbA1C: No results for input(s): HGBA1C in the last 72 hours. CBG: No results for input(s): GLUCAP in the last 168 hours. Lipid Profile: No results for input(s): CHOL, HDL, LDLCALC, TRIG, CHOLHDL, LDLDIRECT in the last 72 hours. Thyroid Function Tests: No results for input(s): TSH, T4TOTAL, FREET4, T3FREE, THYROIDAB in the last 72 hours. Anemia Panel: No results for input(s): VITAMINB12, FOLATE, FERRITIN, TIBC, IRON, RETICCTPCT in the last 72 hours. Sepsis Labs: No results for input(s): PROCALCITON, LATICACIDVEN in the last 168 hours.  Recent Results (from the past 240 hour(s))  Resp Panel by RT-PCR (Flu A&B, Covid) Nasopharyngeal Swab     Status: None   Collection Time: 11/07/21 11:18 AM   Specimen: Nasopharyngeal Swab; Nasopharyngeal(NP) swabs in vial transport medium  Result Value Ref Range Status   SARS Coronavirus 2 by RT PCR NEGATIVE NEGATIVE Final    Comment: (NOTE) SARS-CoV-2 target nucleic acids are NOT DETECTED.  The SARS-CoV-2 RNA is generally detectable in upper respiratory specimens during the acute phase of infection. The lowest concentration of SARS-CoV-2 viral copies this assay can detect is 138 copies/mL. A negative result does not preclude SARS-Cov-2 infection and should not be used as the sole basis for treatment or other patient management decisions. A negative result may occur with  improper specimen collection/handling, submission of specimen other than nasopharyngeal swab, presence of viral mutation(s) within the areas targeted by this assay, and inadequate number of viral copies(<138 copies/mL). A negative result must be combined with clinical observations, patient history, and epidemiological information. The expected result is Negative.  Fact Sheet for Patients:   BloggerCourse.com  Fact Sheet for Healthcare Providers:  SeriousBroker.it  This test is no t yet approved or cleared by the [place] FDA and  has been authorized for detection and/or diagnosis of SARS-CoV-2 by FDA under an Emergency Use Authorization (EUA). This EUA will remain  in effect (meaning this test can be used) for the duration of the COVID-19 declaration under Section 564(b)(1) of the Act, 21 U.S.C.section 360bbb-3(b)(1), unless the authorization is terminated  or revoked sooner.       Influenza A by PCR NEGATIVE NEGATIVE Final   Influenza B by PCR NEGATIVE NEGATIVE Final    Comment: (NOTE) The Xpert Xpress SARS-CoV-2/FLU/RSV plus assay is intended as an aid in the diagnosis of influenza from Nasopharyngeal swab specimens and should not be used as a sole basis for treatment. Nasal washings and aspirates are unacceptable for Xpert Xpress SARS-CoV-2/FLU/RSV testing.  Fact Sheet for Patients: BloggerCourse.com  Fact Sheet for Healthcare Providers: SeriousBroker.it  This test is not yet approved or cleared by the [place] FDA and has been authorized for detection and/or diagnosis of SARS-CoV-2 by FDA under an Emergency Use Authorization (EUA). This EUA will remain in effect (meaning this test can be used) for the duration of the COVID-19 declaration under Section 564(b)(1) of the Act, 21 U.S.C. section 360bbb-3(b)(1), unless the authorization is terminated or revoked.  Performed at [facility], [address] [place], [place] [address]          Radiology Studies: DG Pelvis 1-2 Views  Result Date: 11/07/2021 CLINICAL DATA:  Rule out fracture EXAM: PELVIS - 1-2 VIEW COMPARISON:  None. FINDINGS: There is no evidence of acute fracture. Mild bilateral hip osteoarthritis. Decreased femoral head neck offset bilaterally consistent with cam  deformities. No focal bone lesion. IMPRESSION: No evidence of acute fracture on single frontal view of the pelvis. Electronically Signed   By: Caprice Renshaw M.D.   On: 11/07/2021 14:27   CT PELVIS WO CONTRAST  Result Date: 11/07/2021 CLINICAL DATA:  Pelvic pain EXAM: CT PELVIS WITHOUT CONTRAST TECHNIQUE: Multidetector CT imaging of the pelvis was performed following the standard protocol without intravenous contrast. RADIATION DOSE REDUCTION: This exam was performed according to the departmental dose-optimization program which includes automated exposure control, adjustment of the mA and/or kV according to patient size and/or use of iterative reconstruction technique. COMPARISON:  11/07/2021 FINDINGS: Urinary Tract: Excreted contrast within the urinary bladder from previous CT. Distal ureters are unremarkable. Bowel: No bowel obstruction or ileus. No bowel wall thickening or inflammatory change. Vascular/Lymphatic: No pathologically enlarged lymph nodes. No significant vascular abnormality seen. Reproductive:  Prostate is unremarkable. Other: No free fluid or free gas. Small fat containing right inguinal hernia unchanged. No bowel herniation. Musculoskeletal: There are no acute displaced fractures. Mild symmetrical bilateral hip osteoarthritis. IMPRESSION: 1. No acute displaced fracture. Mild symmetrical bilateral hip osteoarthritis. 2. Small fat containing right inguinal hernia. Electronically Signed   By: Sharlet Salina M.D.   On: 11/07/2021 19:19   MR THORACIC SPINE W WO CONTRAST  Result Date: 11/07/2021 CLINICAL DATA:  Fall, back pain EXAM: MRI THORACIC WITHOUT AND WITH CONTRAST TECHNIQUE: Multiplanar and multiecho pulse sequences of the thoracic spine were obtained without and with intravenous contrast. CONTRAST:  78mL GADAVIST GADOBUTROL 1 MMOL/ML IV SOLN COMPARISON:  CT 10/18/2020 FINDINGS: Alignment:  Physiologic. Vertebrae: No fracture, evidence of discitis, or bone lesion. No abnormal enhancement on  postcontrast sequences. Cord:  Normal signal and morphology. Paraspinal and other soft tissues: Negative. Disc levels: Negative. Intervertebral discs of the thoracic spine are preserved without disc height loss or focal disc protrusion. Facet joints within normal limits. No foraminal or canal stenosis at any level within the thoracic spine. IMPRESSION: Unremarkable pre and post-contrast MRI of the thoracic spine. Electronically Signed   By: Duanne Guess D.O.   On: 11/07/2021 12:40   MR Lumbar Spine W Wo Contrast  Result Date: 11/07/2021 CLINICAL DATA:  Low back pain after fall EXAM: MRI LUMBAR SPINE WITHOUT AND WITH CONTRAST TECHNIQUE: Multiplanar and multiecho pulse sequences of the lumbar spine were obtained without and with intravenous contrast. CONTRAST:  27mL GADAVIST GADOBUTROL 1 MMOL/ML IV SOLN COMPARISON:  CT 05/20/2015 FINDINGS: Segmentation:  Standard. Alignment:  Physiologic. Vertebrae: Acute mild superior endplate compression fracture of L2 with approximately 10% vertebral body height loss. No retropulsion. No additional fractures. No evidence of discitis. No suspicious bone lesion. No abnormal enhancement on postcontrast sequences. Conus medullaris and cauda equina: Conus extends to the T12-L1 level. Conus and cauda equina appear normal. Paraspinal and other soft tissues: Negative. Disc levels: T12-L1 through L3-L4: Unremarkable. L4-L5: Mild annular disc bulge and mild bilateral facet hypertrophy. Borderline-mild left foraminal stenosis. No canal stenosis. L5-S1: No disc protrusion. Mild bilateral facet arthropathy. No foraminal or canal stenosis. IMPRESSION: 1. Acute mild superior endplate compression fracture of L2 with approximately 10% vertebral body height loss. No retropulsion. 2.  Mild lower lumbar spondylosis with borderline-mild left foraminal stenosis at L4-5. No canal stenosis at any level. Electronically Signed   By: Duanne Guess D.O.   On: 11/07/2021 12:43        Scheduled  Meds:  atorvastatin  20 mg Oral Daily   baclofen  10 mg Oral TID   enoxaparin (LOVENOX) injection  40 mg Subcutaneous Q24H   feeding supplement  237 mL Oral BID BM   fluticasone furoate-vilanterol  1 puff Inhalation q AM   gabapentin  250 mg Oral QHS   montelukast  10 mg Oral QHS   riluzole  50 mg Oral BID   senna-docusate  1 tablet Oral QHS   traZODone  100 mg Oral QHS   Continuous Infusions:        Glade Lloyd, MD [facility] 11/09/2021, 10:34 AM

## 2021-11-12 ENCOUNTER — Emergency Department: Payer: Medicare Other

## 2021-11-12 ENCOUNTER — Inpatient Hospital Stay
Admission: EM | Admit: 2021-11-12 | Discharge: 2021-11-14 | DRG: 177 | Disposition: A | Discharge status: Home Health | Payer: Medicare Other | Attending: Internal Medicine | Admitting: Internal Medicine

## 2021-11-12 ENCOUNTER — Encounter: Payer: Self-pay | Admitting: Emergency Medicine

## 2021-11-12 ENCOUNTER — Other Ambulatory Visit: Payer: Self-pay

## 2021-11-12 DIAGNOSIS — M79604 Pain in right leg: Secondary | ICD-10-CM | POA: Diagnosis present

## 2021-11-12 DIAGNOSIS — Z8249 Family history of ischemic heart disease and other diseases of the circulatory system: Secondary | ICD-10-CM

## 2021-11-12 DIAGNOSIS — J45909 Unspecified asthma, uncomplicated: Secondary | ICD-10-CM | POA: Diagnosis present

## 2021-11-12 DIAGNOSIS — R079 Chest pain, unspecified: Secondary | ICD-10-CM | POA: Diagnosis not present

## 2021-11-12 DIAGNOSIS — S32020A Wedge compression fracture of second lumbar vertebra, initial encounter for closed fracture: Secondary | ICD-10-CM | POA: Diagnosis present

## 2021-11-12 DIAGNOSIS — U071 COVID-19: Secondary | ICD-10-CM | POA: Diagnosis not present

## 2021-11-12 DIAGNOSIS — M79606 Pain in leg, unspecified: Secondary | ICD-10-CM

## 2021-11-12 DIAGNOSIS — Z885 Allergy status to narcotic agent status: Secondary | ICD-10-CM

## 2021-11-12 DIAGNOSIS — Z886 Allergy status to analgesic agent status: Secondary | ICD-10-CM

## 2021-11-12 DIAGNOSIS — W19XXXA Unspecified fall, initial encounter: Secondary | ICD-10-CM | POA: Diagnosis present

## 2021-11-12 DIAGNOSIS — G1221 Amyotrophic lateral sclerosis: Secondary | ICD-10-CM | POA: Diagnosis present

## 2021-11-12 DIAGNOSIS — I2510 Atherosclerotic heart disease of native coronary artery without angina pectoris: Secondary | ICD-10-CM | POA: Diagnosis present

## 2021-11-12 DIAGNOSIS — E785 Hyperlipidemia, unspecified: Secondary | ICD-10-CM | POA: Diagnosis present

## 2021-11-12 DIAGNOSIS — Z87442 Personal history of urinary calculi: Secondary | ICD-10-CM

## 2021-11-12 DIAGNOSIS — Z7951 Long term (current) use of inhaled steroids: Secondary | ICD-10-CM

## 2021-11-12 DIAGNOSIS — J9601 Acute respiratory failure with hypoxia: Secondary | ICD-10-CM

## 2021-11-12 DIAGNOSIS — Z8673 Personal history of transient ischemic attack (TIA), and cerebral infarction without residual deficits: Secondary | ICD-10-CM

## 2021-11-12 DIAGNOSIS — Z880 Allergy status to penicillin: Secondary | ICD-10-CM

## 2021-11-12 DIAGNOSIS — Z79899 Other long term (current) drug therapy: Secondary | ICD-10-CM

## 2021-11-12 DIAGNOSIS — M4856XA Collapsed vertebra, not elsewhere classified, lumbar region, initial encounter for fracture: Secondary | ICD-10-CM | POA: Diagnosis present

## 2021-11-12 DIAGNOSIS — R1310 Dysphagia, unspecified: Secondary | ICD-10-CM | POA: Diagnosis present

## 2021-11-12 DIAGNOSIS — Z66 Do not resuscitate: Secondary | ICD-10-CM | POA: Diagnosis present

## 2021-11-12 DIAGNOSIS — Z888 Allergy status to other drugs, medicaments and biological substances status: Secondary | ICD-10-CM

## 2021-11-12 DIAGNOSIS — G8929 Other chronic pain: Secondary | ICD-10-CM | POA: Diagnosis present

## 2021-11-12 DIAGNOSIS — Z823 Family history of stroke: Secondary | ICD-10-CM

## 2021-11-12 DIAGNOSIS — Z9181 History of falling: Secondary | ICD-10-CM

## 2021-11-12 LAB — CBC
HCT: 41 % (ref 39.0–52.0)
Hemoglobin: 14 g/dL (ref 13.0–17.0)
MCH: 33.9 pg (ref 26.0–34.0)
MCHC: 34.1 g/dL (ref 30.0–36.0)
MCV: 99.3 fL (ref 80.0–100.0)
Platelets: 243 10*3/uL (ref 150–400)
RBC: 4.13 MIL/uL — ABNORMAL LOW (ref 4.22–5.81)
RDW: 11.8 % (ref 11.5–15.5)
WBC: 6 10*3/uL (ref 4.0–10.5)
nRBC: 0 % (ref 0.0–0.2)

## 2021-11-12 LAB — BASIC METABOLIC PANEL
Anion gap: 6 (ref 5–15)
BUN: 14 mg/dL (ref 8–23)
CO2: 25 mmol/L (ref 22–32)
Calcium: 9.5 mg/dL (ref 8.9–10.3)
Chloride: 103 mmol/L (ref 98–111)
Creatinine, Ser: 0.78 mg/dL (ref 0.61–1.24)
GFR, Estimated: >60 mL/min (ref >60)
Glucose, Bld: 140 mg/dL — ABNORMAL HIGH (ref 70–99)
Potassium: 3.8 mmol/L (ref 3.5–5.1)
Sodium: 134 mmol/L — ABNORMAL LOW (ref 135–145)

## 2021-11-12 LAB — TROPONIN I (HIGH SENSITIVITY): Troponin I (High Sensitivity): 3 ng/L (ref <18)

## 2021-11-12 IMAGING — CT CT ANGIO CHEST
2 of 7 series · 18 of 46 positions shown · IV contrast (APPLIED)
Comparison: CT chest dated [DATE] at [DATE] p.m.

CLINICAL DATA: Chest pain

EXAM:
CT ANGIOGRAPHY CHEST WITH CONTRAST
TECHNIQUE: Multidetector CT imaging of the chest was performed using the
standard protocol during bolus administration of intravenous
contrast. Multiplanar CT image reconstructions and MIPs were
obtained to evaluate the vascular anatomy.

[Series 5: thins · axial · 0.76mm/px · z∈[-937,-648]mm · 15 of 404 slices shown]
[im 21/404  lung]
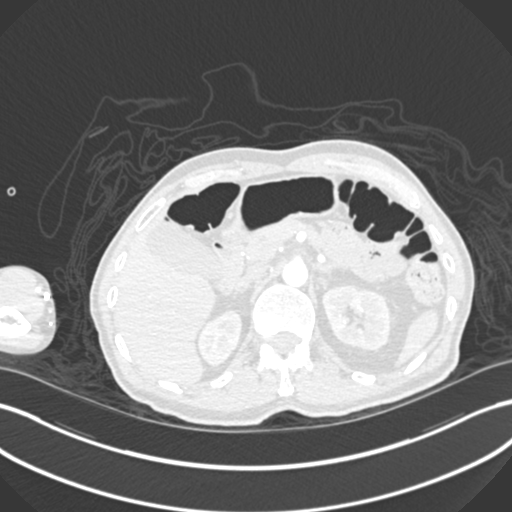
[im 41/404  soft-tissue]
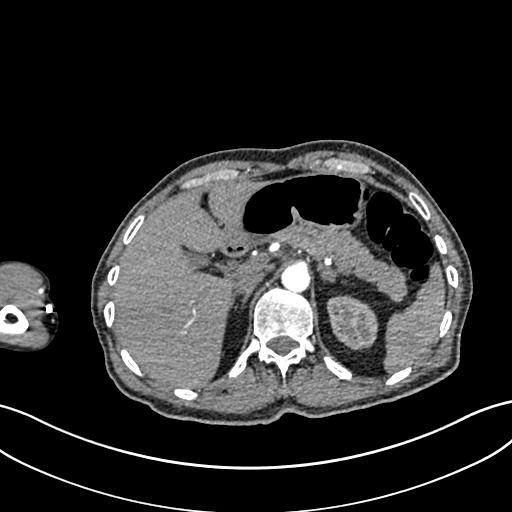
[im 81/404  lung]
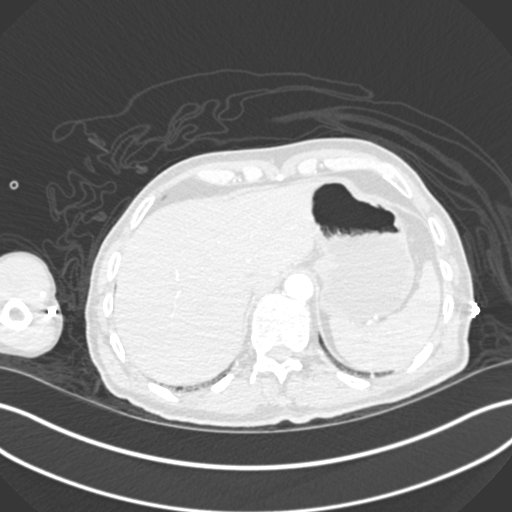
[im 101/404  soft-tissue]
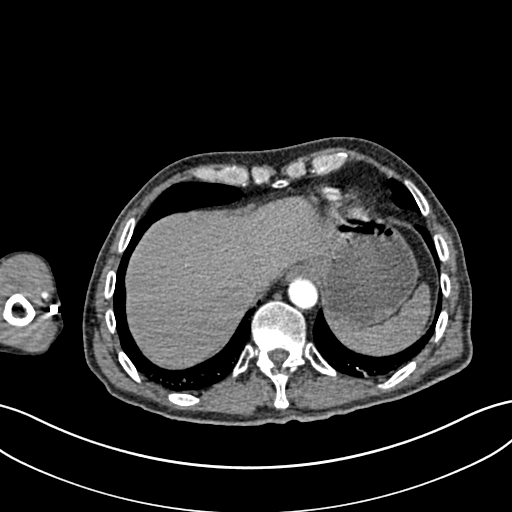
[im 121/404  lung]
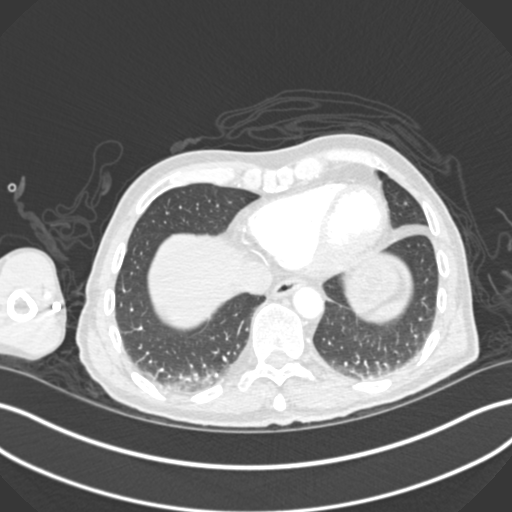
[im 142/404  soft-tissue]
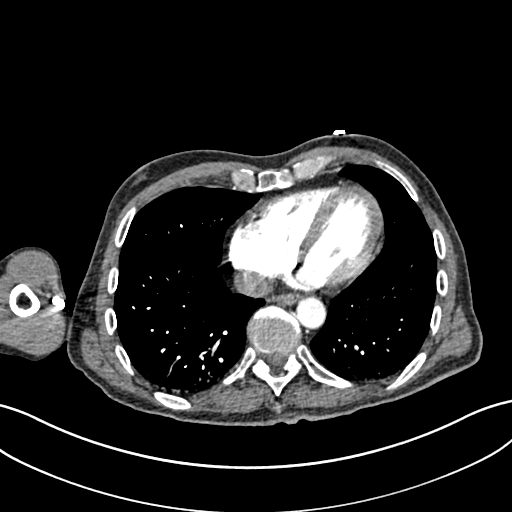
[im 182/404  lung]
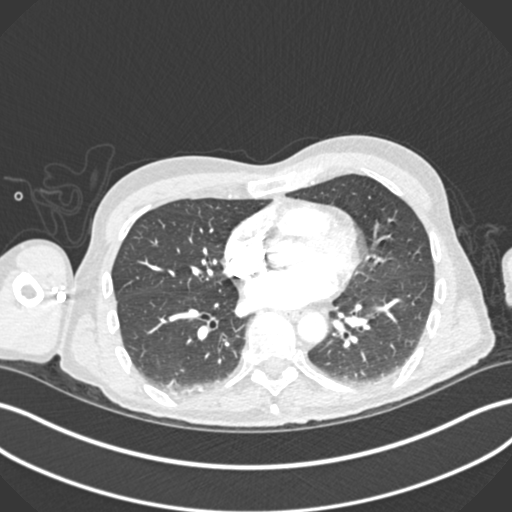
[im 202/404  soft-tissue]
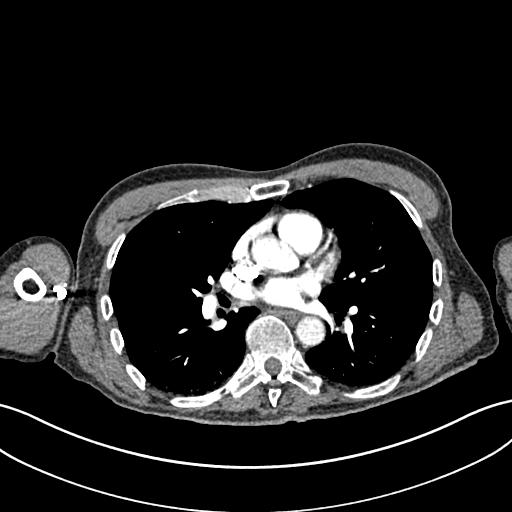
[im 222/404  lung]
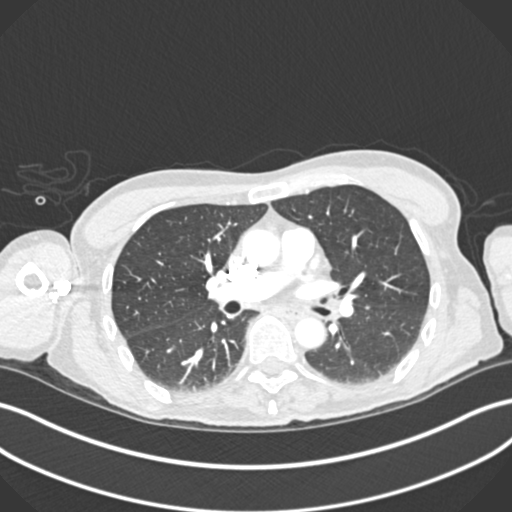
[im 262/404  soft-tissue]
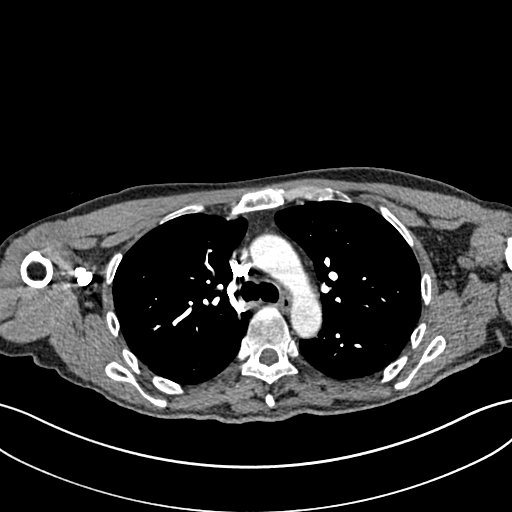
[im 283/404  lung]
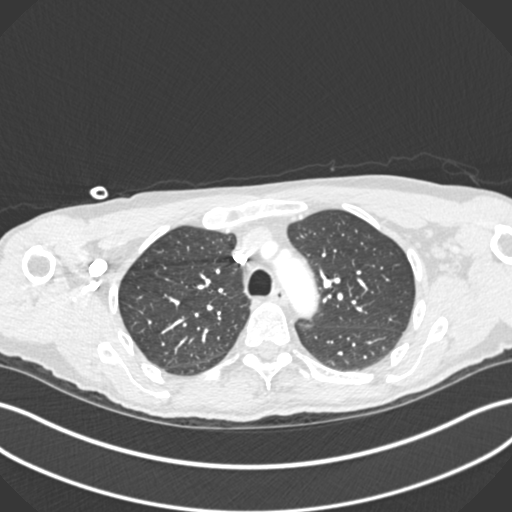
[im 303/404  soft-tissue]
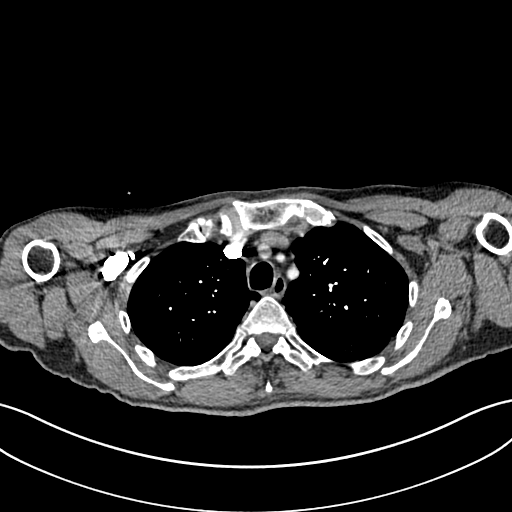
[im 323/404  lung]
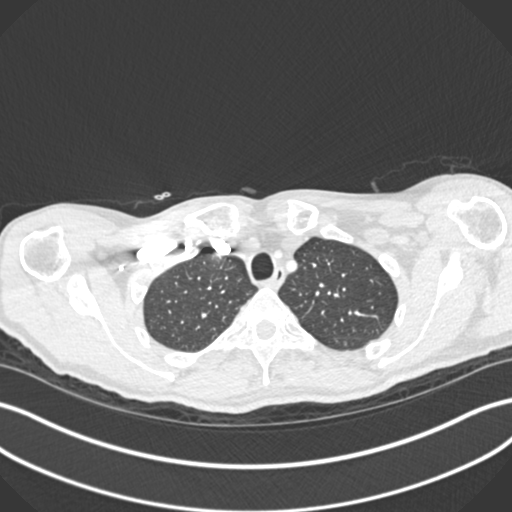
[im 363/404  soft-tissue]
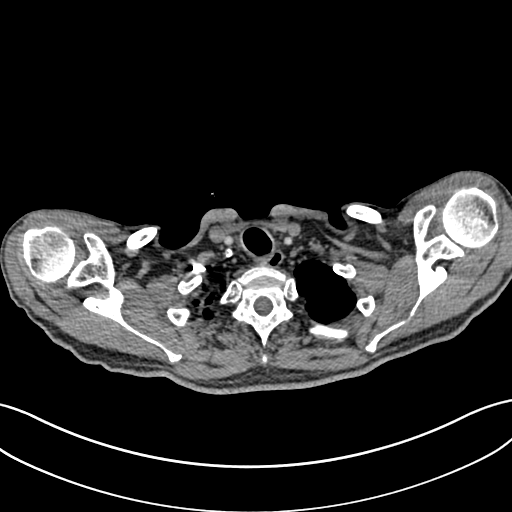
[im 383/404  lung]
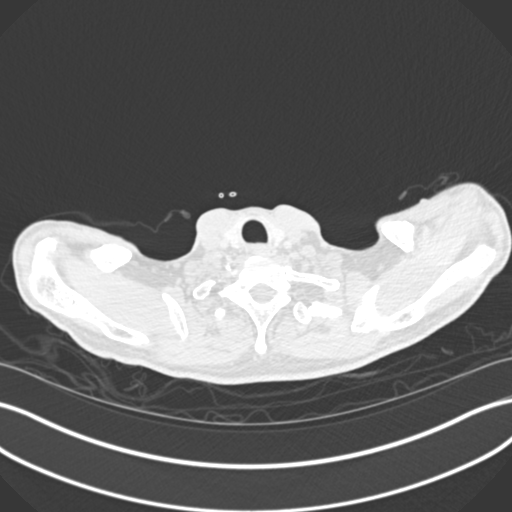

[Series 7: coronal mpr · coronal · 0.63mm/px · 3 of 71 slices shown]
[im 18/71  soft-tissue]
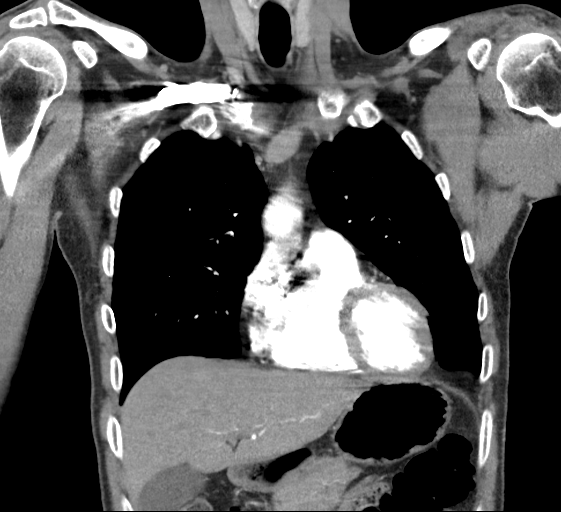
[im 36/71  soft-tissue]
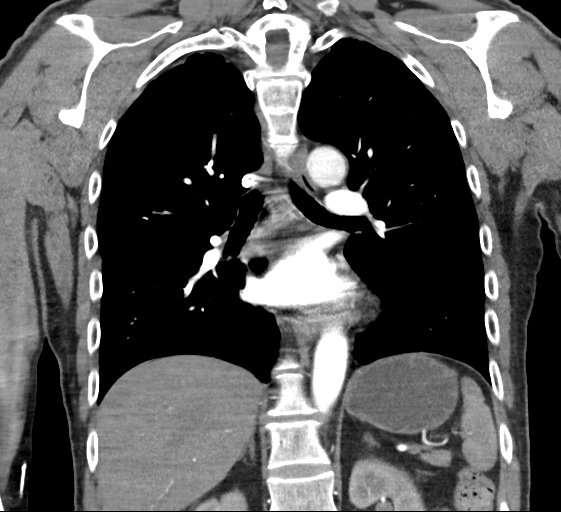
[im 53/71  soft-tissue]
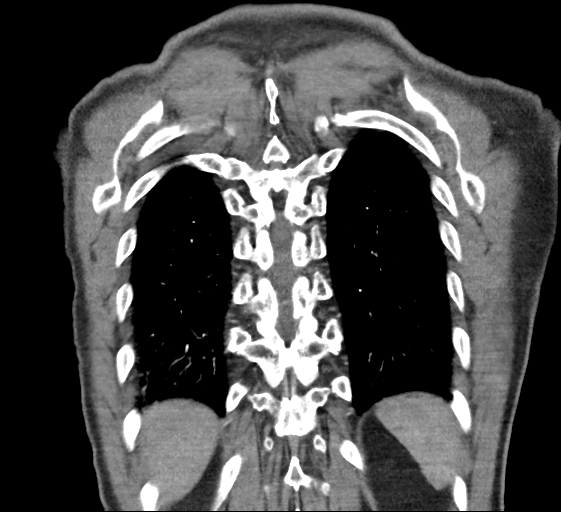

[18 of 46 positions shown; findings below may reference images not displayed]

RADIATION DOSE REDUCTION: This exam was performed according to the
departmental dose-optimization program which includes automated
exposure control, adjustment of the mA and/or kV according to
patient size and/or use of iterative reconstruction technique.

CONTRAST:  75mL OMNIPAQUE IOHEXOL 350 MG/ML SOLN
FINDINGS: Cardiovascular: Satisfactory opacification of the bilateral
pulmonary arteries to the segmental level. No evidence of pulmonary
embolism.

Although not tailored for evaluation of the thoracic aorta, there is
no evidence of thoracic aortic aneurysm or dissection.

Heart is normal in size.  No pericardial effusion.

Mediastinum/Nodes: No suspicious mediastinal lymphadenopathy.

Visualized thyroid is unremarkable.

Lungs/Pleura: Mild dependent atelectasis in the bilateral lower
lobes.

No suspicious pulmonary nodules.

Mild biapical pleural-parenchymal scarring.

No pleural effusion or pneumothorax.

Upper Abdomen: Visualized upper abdomen is grossly unremarkable.

Musculoskeletal: Visualized osseous structures are within normal
limits.

Review of the MIP images confirms the above findings.
IMPRESSION: No evidence of pulmonary embolism.

No evidence of acute cardiopulmonary disease.

## 2021-11-12 IMAGING — CR DG CHEST 2V
1 series · 3 of 3 positions shown · non-contrast
Comparison: Comparison is made with [DATE].

CLINICAL DATA: A 60-year-old male presents with history of chest
pain.

EXAM:
CHEST - 2 VIEW

[Series 1: dg chest 2 view · 0.14mm/px · 3 of 3 slices shown]
[im 1/3]
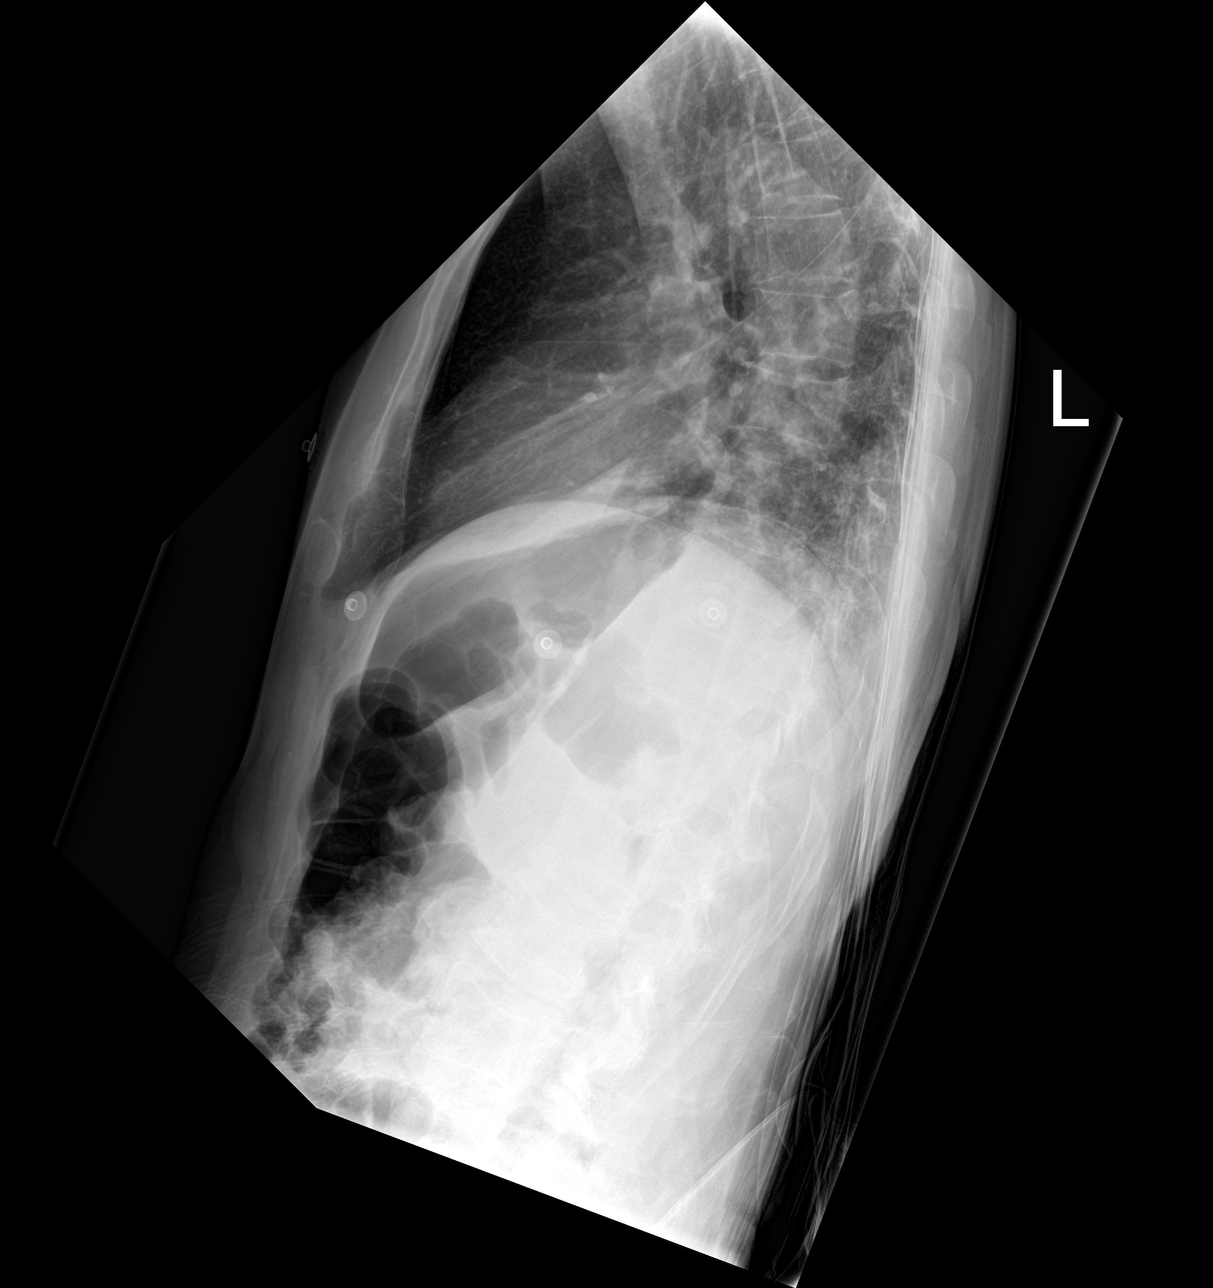
[im 2/3]
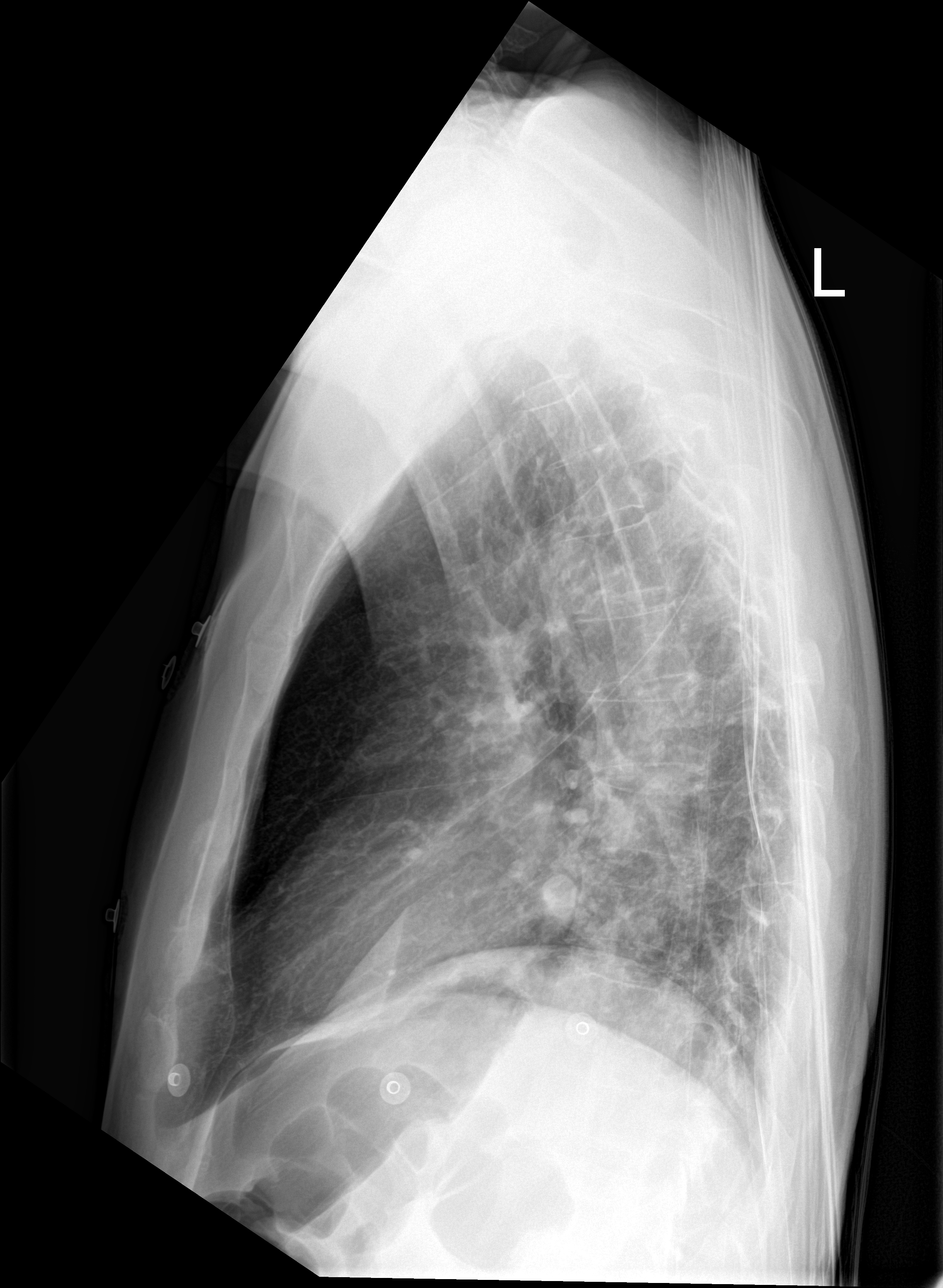
[im 3/3]
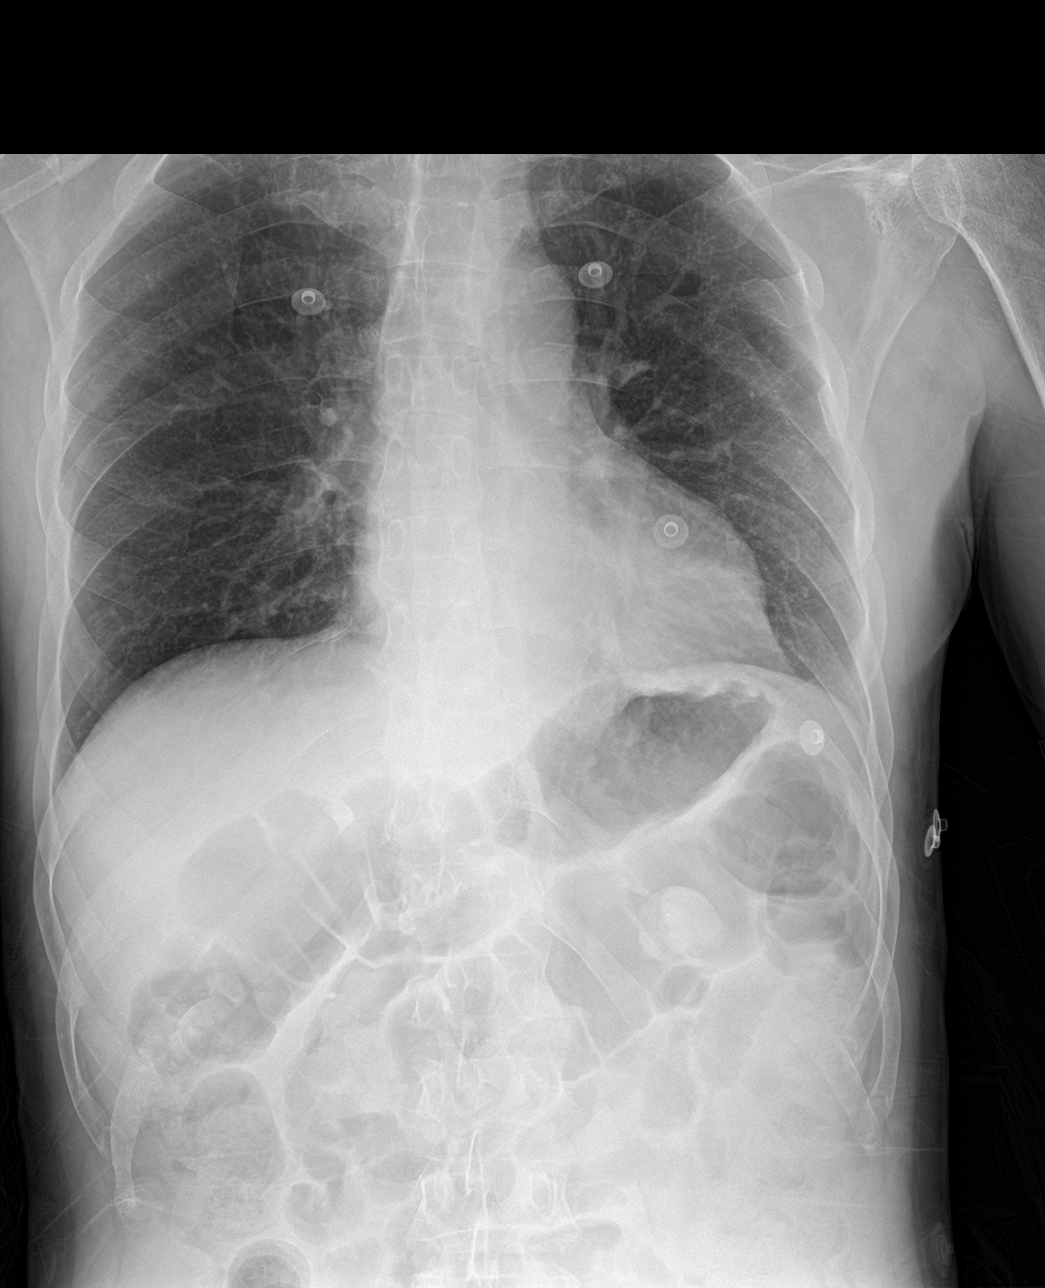

[3 of 3 positions shown; findings below may reference images not displayed]

FINDINGS: Trachea midline.

Cardiomediastinal contours and hilar structures are normal.

Increased density in the retrocardiac region. No frank lobar
consolidation. No visible pneumothorax. No sign of pleural effusion.

On limited assessment there is no acute skeletal process.

Extensive gaseous distension of the colon with scattered loops of
small bowel in the upper abdomen as well.
IMPRESSION: Increased density in the retrocardiac region, findings suspicious
for developing pneumonia with added density over the spine on the
lateral projection.

Gaseous distension of both large and small bowel in the abdomen.
Correlate with any abdominal symptoms and with evaluation with
dedicated abdominal assessment as warranted.

## 2021-11-12 IMAGING — CT CT CHEST W/O CM
2 of 4 series · 15 of 36 positions shown, 18 images · non-contrast
Comparison: Chest radiograph dated [DATE]. Chest CT dated
[DATE].

CLINICAL DATA: Respiratory distress.



[Series 2: thorax · axial · 0.61mm/px · z∈[-897,-631]mm · 12 of 159 slices shown, 15 images]
[im 13/159  mediastinal]
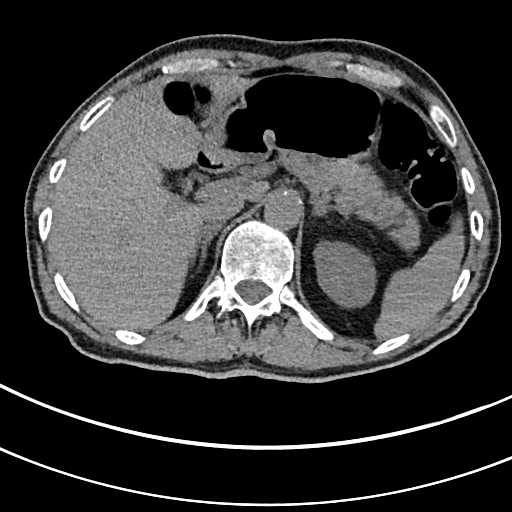
[im 13/159  lung]
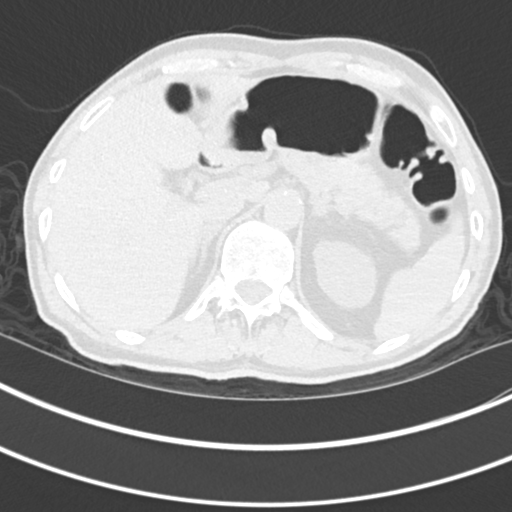
[im 25/159  lung]
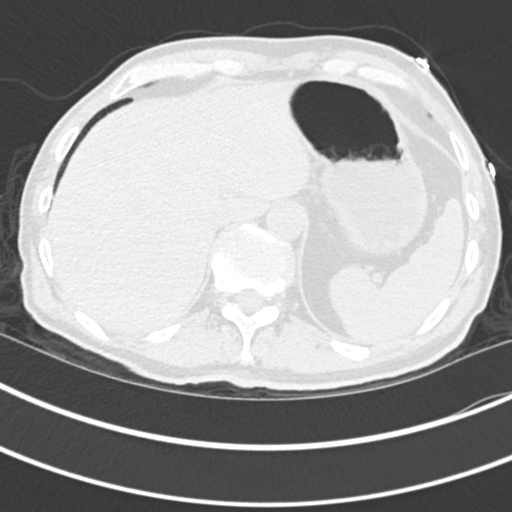
[im 37/159  lung]
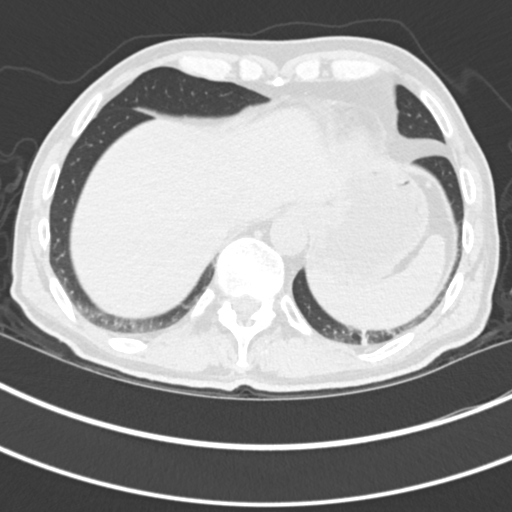
[im 49/159  lung]
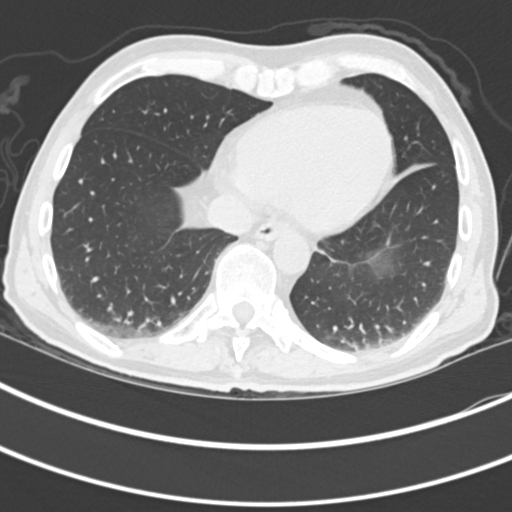
[im 61/159  mediastinal]
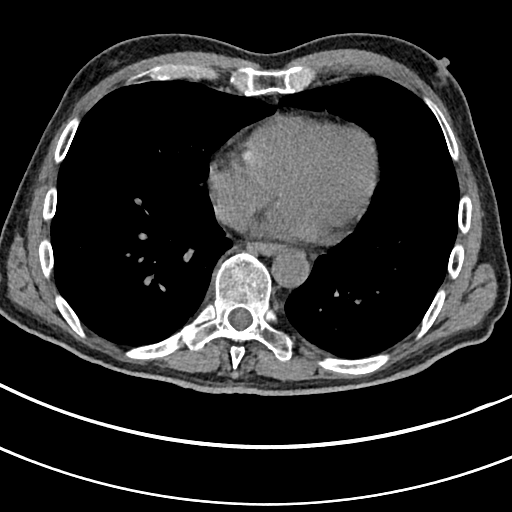
[im 61/159  lung]
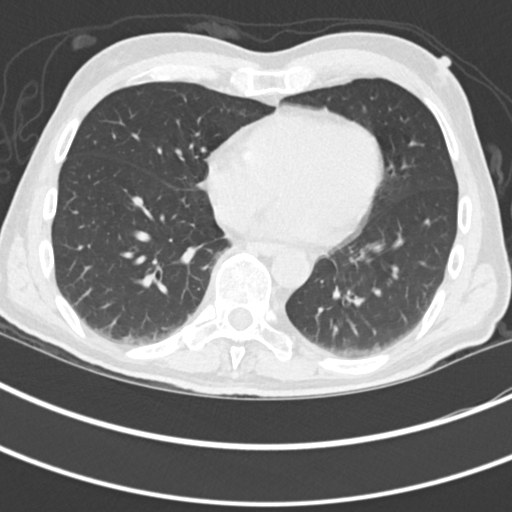
[im 73/159  lung]
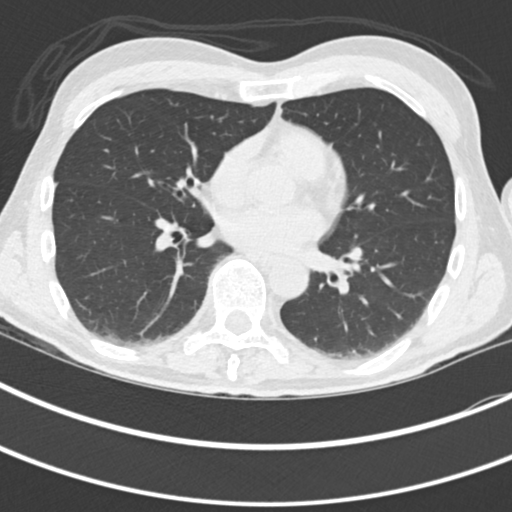
[im 86/159  lung]
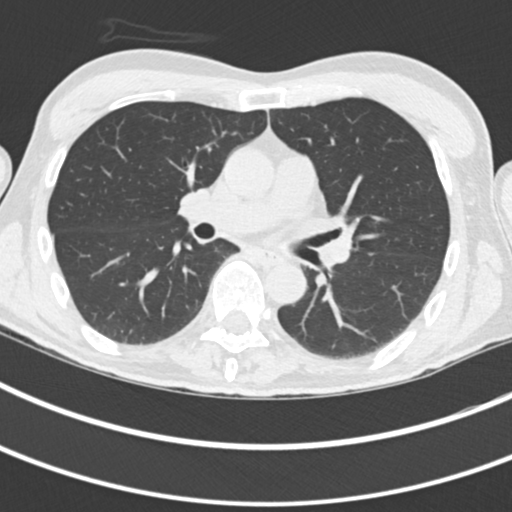
[im 98/159  lung]
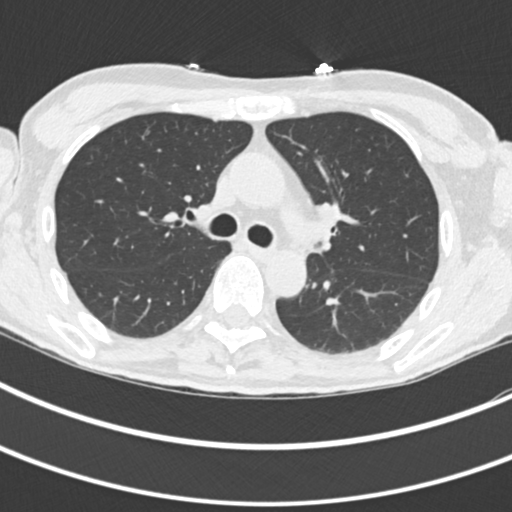
[im 110/159  mediastinal]
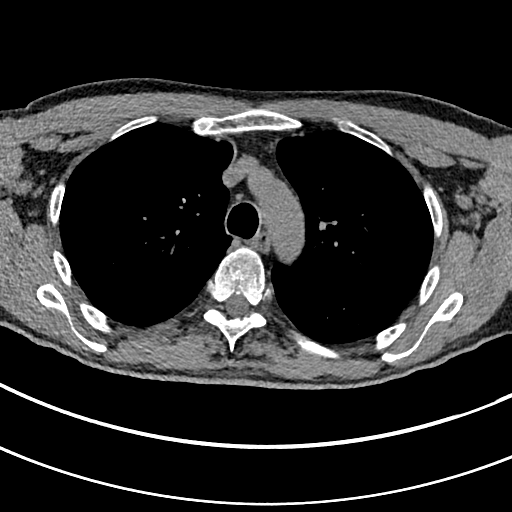
[im 110/159  lung]
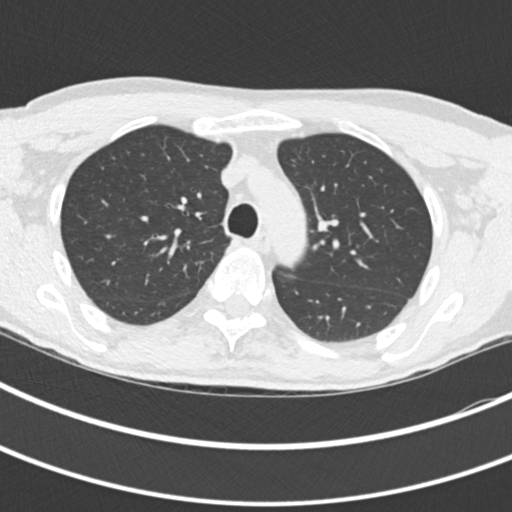
[im 122/159  lung]
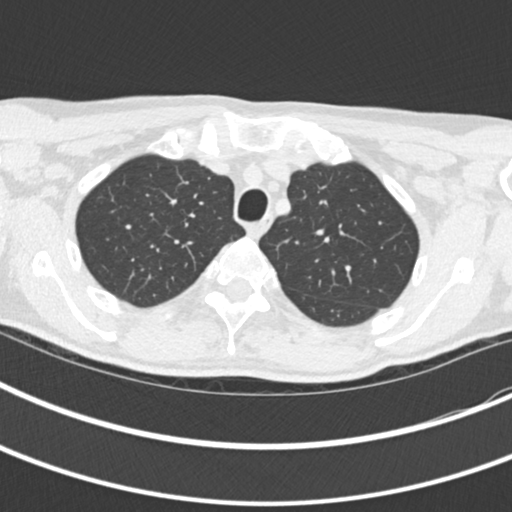
[im 134/159  lung]
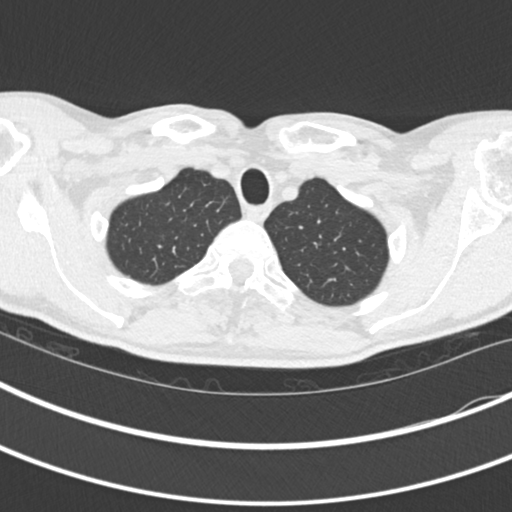
[im 146/159  lung]
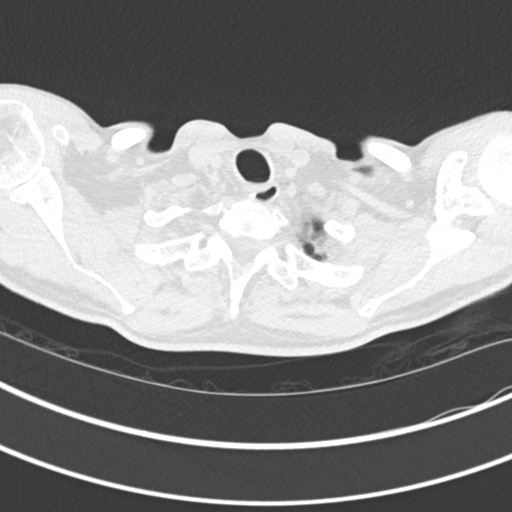

[Series 5: coronal · coronal · 0.64mm/px · 3 of 108 slices shown]
[im 22/108  lung]
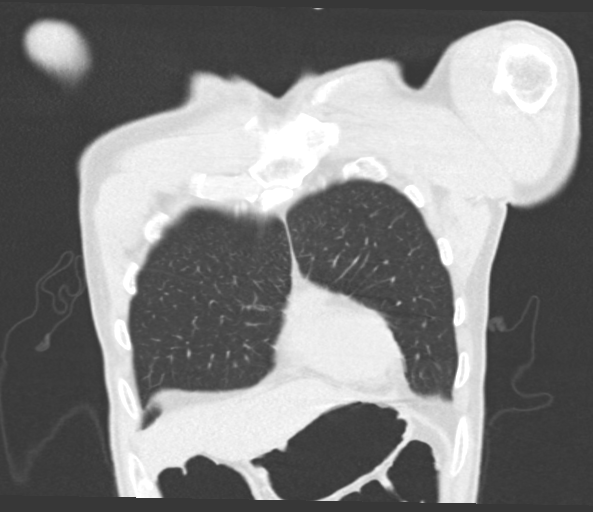
[im 43/108  lung]
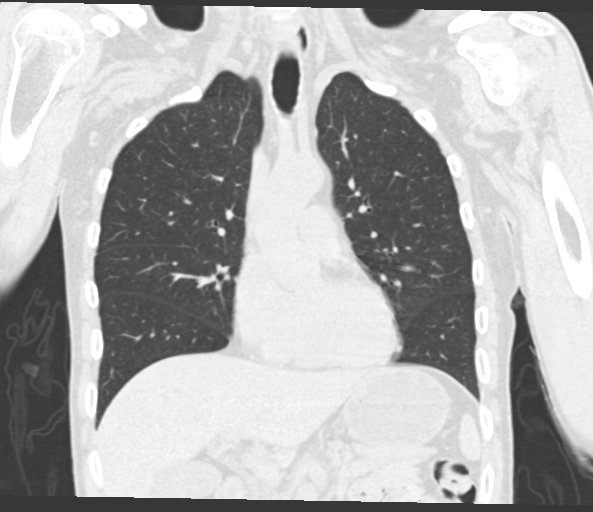
[im 65/108  lung]
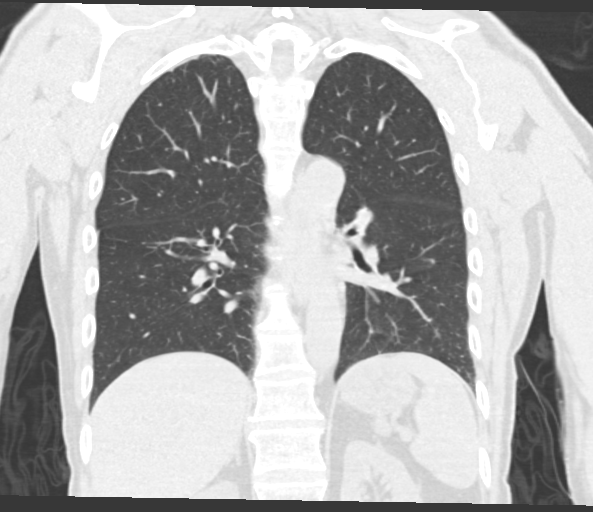

[15 of 36 positions shown; findings below may reference images not displayed]

FINDINGS: Evaluation of this exam is limited in the absence of intravenous
contrast.

Cardiovascular: There is no cardiomegaly or pericardial effusion.
The thoracic aorta and central pulmonary arteries are grossly
unremarkable on this noncontrast CT.

Mediastinum/Nodes: No hilar or mediastinal adenopathy. The esophagus
and the thyroid gland are grossly unremarkable. No mediastinal fluid
collection.

Lungs/Pleura: Minimal bibasilar dependent subpleural atelectasis. No
focal consolidation, pleural effusion, or pneumothorax. There is a 7
mm nodule along the left fissure similar to prior CT, likely a para
fissural lymph node or scarring. The central airways are patent.

Upper Abdomen: A 2 mm left renal upper pole calculus. No
hydronephrosis.

Musculoskeletal: No chest wall mass or suspicious bone lesions
identified.
IMPRESSION: 1. No acute intrathoracic pathology.
2. A 2 mm left renal upper pole calculus. No hydronephrosis.

## 2021-11-12 MED ORDER — ACETAMINOPHEN 650 MG RE SUPP: 650.0000 mg | Freq: Four times a day (QID) | RECTAL | Status: DC | PRN

## 2021-11-12 MED ORDER — ONDANSETRON HCL 4 MG/2ML IJ SOLN
4.0000 mg | Freq: Four times a day (QID) | INTRAMUSCULAR | Status: DC | PRN
  Administered 2021-11-13: 04:00:00 4 mg via INTRAVENOUS
  Filled 2021-11-12: qty 2

## 2021-11-12 MED ORDER — OXYCODONE HCL 5 MG PO TABS: 5.0000 mg | ORAL_TABLET | ORAL | Status: DC | PRN

## 2021-11-12 MED ORDER — ATORVASTATIN CALCIUM 20 MG PO TABS
20.0000 mg | ORAL_TABLET | Freq: Every day | ORAL | Status: DC
  Administered 2021-11-13: 20 mg via ORAL
  Filled 2021-11-12: qty 1

## 2021-11-12 MED ORDER — FENTANYL CITRATE PF 50 MCG/ML IJ SOSY
50.0000 ug | PREFILLED_SYRINGE | Freq: Once | INTRAMUSCULAR | Status: AC
  Administered 2021-11-13: 50 ug via INTRAVENOUS
  Filled 2021-11-12: qty 1

## 2021-11-12 MED ORDER — BACLOFEN 10 MG PO TABS
10.0000 mg | ORAL_TABLET | Freq: Three times a day (TID) | ORAL | Status: DC
  Administered 2021-11-13 – 2021-11-14 (×4): 10 mg via ORAL
  Filled 2021-11-12 (×5): qty 1

## 2021-11-12 MED ORDER — SENNOSIDES-DOCUSATE SODIUM 8.6-50 MG PO TABS
1.0000 | ORAL_TABLET | Freq: Every day | ORAL | Status: DC
  Administered 2021-11-13: 20:00:00 1 via ORAL
  Filled 2021-11-12: qty 1

## 2021-11-12 MED ORDER — IOHEXOL 350 MG/ML SOLN
75.0000 mL | Freq: Once | INTRAVENOUS | Status: AC | PRN
  Administered 2021-11-12: 75 mL via INTRAVENOUS

## 2021-11-12 MED ORDER — FENTANYL CITRATE PF 50 MCG/ML IJ SOSY
50.0000 ug | PREFILLED_SYRINGE | Freq: Once | INTRAMUSCULAR | Status: AC
  Administered 2021-11-12: 50 ug via INTRAVENOUS
  Filled 2021-11-12: qty 1

## 2021-11-12 MED ORDER — FLUTICASONE FUROATE-VILANTEROL 100-25 MCG/ACT IN AEPB
1.0000 | INHALATION_SPRAY | Freq: Every day | RESPIRATORY_TRACT | Status: DC
  Administered 2021-11-13 – 2021-11-14 (×2): 1 via RESPIRATORY_TRACT
  Filled 2021-11-12: qty 28

## 2021-11-12 MED ORDER — ONDANSETRON HCL 4 MG PO TABS: 4.0000 mg | ORAL_TABLET | Freq: Four times a day (QID) | ORAL | Status: DC | PRN

## 2021-11-12 MED ORDER — ENOXAPARIN SODIUM 40 MG/0.4ML IJ SOSY
40.0000 mg | PREFILLED_SYRINGE | INTRAMUSCULAR | Status: DC
  Administered 2021-11-14: 40 mg via SUBCUTANEOUS
  Filled 2021-11-12: qty 0.4

## 2021-11-12 MED ORDER — ALBUTEROL SULFATE HFA 108 (90 BASE) MCG/ACT IN AERS
1.0000 | INHALATION_SPRAY | Freq: Four times a day (QID) | RESPIRATORY_TRACT | Status: DC | PRN
  Filled 2021-11-12: qty 6.7

## 2021-11-12 MED ORDER — MONTELUKAST SODIUM 10 MG PO TABS
10.0000 mg | ORAL_TABLET | Freq: Every day | ORAL | Status: DC
  Administered 2021-11-13: 10 mg via ORAL
  Filled 2021-11-12: qty 1

## 2021-11-12 MED ORDER — ACETAMINOPHEN 325 MG PO TABS
650.0000 mg | ORAL_TABLET | Freq: Four times a day (QID) | ORAL | Status: DC | PRN
  Administered 2021-11-14: 10:00:00 650 mg via ORAL
  Filled 2021-11-12: qty 2

## 2021-11-12 MED ORDER — GABAPENTIN 250 MG/5ML PO SOLN
250.0000 mg | Freq: Every day | ORAL | Status: DC
  Administered 2021-11-13: 21:00:00 250 mg via ORAL
  Filled 2021-11-12 (×3): qty 5

## 2021-11-12 MED ORDER — RILUZOLE 50 MG PO TABS: 50.0000 mg | ORAL_TABLET | Freq: Two times a day (BID) | ORAL | Status: DC

## 2021-11-12 MED ORDER — TRAZODONE HCL 50 MG PO TABS
100.0000 mg | ORAL_TABLET | Freq: Every day | ORAL | Status: DC
  Administered 2021-11-13: 100 mg via ORAL
  Filled 2021-11-12: qty 2

## 2021-11-12 NOTE — ED Provider Notes (Signed)
[facility] Provider Note    Event Date/Time   First MD Initiated Contact with Patient 11/12/21 2204     (approximate)  History   Chief Complaint: Chest Pain  HPI  FISCHER HALLEY is a 61 y.o. male with a past medical history of ALS, CAD, hyperlipidemia, prior CVA, presents to the emergency department for chest pain.  According to the patient had a fall recently resulting in some compression fractures in his back.  Patient states he has been having increased pain in his chest as well as his back.  Upon arrival patient found to be hypoxic 87% on room air with no baseline O2 requirement.  Patient denies any cough, but does states subjective fever at home.   Physical Exam   Triage Vital Signs: ED Triage Vitals  Enc Vitals Group     BP 11/12/21 2023 128/82     Pulse Rate 11/12/21 2023 (!) 103     Resp 11/12/21 2023 (!) 22     Temp 11/12/21 2023 99.2 F (37.3 C)     Temp Source 11/12/21 2023 Oral     SpO2 11/12/21 2023 (!) 87 %     Weight 11/12/21 2033 136 lb (61.7 kg)     Height 11/12/21 2033 5\' 10"  (1.778 m)     Head Circumference --      [facility] --      Pain Score 11/12/21 2032 9     Pain Loc --      Pain Edu? --      Excl. in GC? --     Most recent vital signs: Vitals:   11/12/21 2023 11/12/21 2205  BP: 128/82 117/77  Pulse: (!) 103 80  Resp: (!) 22 (!) 22  Temp: 99.2 F (37.3 C)   SpO2: (!) 87% 96%    General: Awake, no distress.  CV:  Good peripheral perfusion.  Regular rate and rhythm  Resp:  Normal effort.  Equal breath sounds bilaterally.  Abd:  No distention.  Soft, nontender.  No rebound or guarding. Other:  Thin/frail extremities   ED Results / Procedures / Treatments   EKG  EKG viewed and interpreted by myself shows a normal sinus rhythm at a 99 bpm with a narrow QRS, normal axis, normal intervals, no concerning ST changes.  RADIOLOGY  I reviewed the CT scan of the chest, no acute findings on my evaluation. CT read  as largely negative for acute abnormality. Chest x-ray concerning for possible retrocardiac pneumonia   MEDICATIONS ORDERED IN ED: Medications - No data to display   IMPRESSION / MDM / ASSESSMENT AND PLAN / ED COURSE  I reviewed the triage vital signs and the nursing notes.  Patient presents to the emergency department for worsening shortness of breath.  Patient has a history of ALS.  Found to be hypoxic to 87% on room air placed on 2 L.  Chest x-ray is concerning for possible retrocardiac pneumonia however CT imaging of the chest does not appear to show any acute abnormality.  Patient's lab work is largely nonrevealing besides a slightly elevated potassium at 5.5.  Patient receiving IV fluids.  CBC is normal.  Given the patient's hypoxia requiring 2 L nasal cannula oxygen and history of ALS we will admit to the hospital service for further work-up and treatment.  Patient's COVID has resulted positive this is very likely the cause of the patient's worsening dyspnea and hypoxia.  Patient admitted to the hospital service.  FINAL  CLINICAL IMPRESSION(S) / ED DIAGNOSES   COVID-19 Hypoxia  Note:  This document was prepared using Dragon voice recognition software and may include unintentional dictation errors.   Minna Antis, MD 11/13/21 2031

## 2021-11-12 NOTE — ED Notes (Signed)
Patient transported to CT 

## 2021-11-12 NOTE — Assessment & Plan Note (Signed)
-   Resume home riluzole 50 mg p.o. twice daily

## 2021-11-12 NOTE — ED Notes (Signed)
Pt reports he has a fracture in his lumbar area and he is in [facility] for Rehab

## 2021-11-12 NOTE — ED Triage Notes (Signed)
Pt presents to ER via EMS from [facility], pt reports he has been having chest pain, left arm pain and back pain since yesterday. Pt reports he told staff at the facility he needed to come to ER yesterday but was ignored. Pt reports he feels shortness of breath and does not wear oxygen at baseline. Pt oxygen saturation in triage 88% . Administered oxygen 2L/[place] pt's oxygen saturation 93%. [name] speech soft. No respiratory distress noted.

## 2021-11-12 NOTE — Assessment & Plan Note (Signed)
-   Resume home maintenance inhaler, Breo Ellipta 1 elation every morning and albuterol inhalation every 6 hours as needed for shortness of breath and wheezing

## 2021-11-12 NOTE — Assessment & Plan Note (Signed)
-   Resume home baclofen 3 times daily, home oxycodone 5 mg immediate release every 4 hours as needed for moderate pain

## 2021-11-12 NOTE — ED Triage Notes (Signed)
First nurse note: pt to ED via EMS from [facility] with MOST form present.  C/c chest pain with left arm and leg pain, also back pain with hx of lumbar fracture.  Per EMS patient EKG NSR rate 95, 10/10 pain and given 2 spray nitro down to 9/10 after second spray, pain upon palpation to chest, ASA allergy, A&Ox4, verbally aggressive upon EMS arrival.

## 2021-11-12 NOTE — Assessment & Plan Note (Signed)
-   Etiology work-up in progress at this time - CTA of the chest to assess for PE is pending - Continue oxygen supplementation to maintain SPO2 greater than 92% - TOC consulted for possible requirement of O2 supplementation on discharge - Admit to telemetry medical, observation

## 2021-11-13 ENCOUNTER — Observation Stay: Payer: Medicare Other

## 2021-11-13 ENCOUNTER — Other Ambulatory Visit: Payer: Self-pay

## 2021-11-13 ENCOUNTER — Encounter: Payer: Self-pay | Admitting: Internal Medicine

## 2021-11-13 DIAGNOSIS — Z9181 History of falling: Secondary | ICD-10-CM | POA: Diagnosis not present

## 2021-11-13 DIAGNOSIS — G1221 Amyotrophic lateral sclerosis: Secondary | ICD-10-CM | POA: Diagnosis present

## 2021-11-13 DIAGNOSIS — G8929 Other chronic pain: Secondary | ICD-10-CM | POA: Diagnosis present

## 2021-11-13 DIAGNOSIS — Z888 Allergy status to other drugs, medicaments and biological substances status: Secondary | ICD-10-CM | POA: Diagnosis not present

## 2021-11-13 DIAGNOSIS — J9601 Acute respiratory failure with hypoxia: Secondary | ICD-10-CM | POA: Diagnosis present

## 2021-11-13 DIAGNOSIS — W19XXXA Unspecified fall, initial encounter: Secondary | ICD-10-CM | POA: Diagnosis present

## 2021-11-13 DIAGNOSIS — Z79899 Other long term (current) drug therapy: Secondary | ICD-10-CM | POA: Diagnosis not present

## 2021-11-13 DIAGNOSIS — Z823 Family history of stroke: Secondary | ICD-10-CM | POA: Diagnosis not present

## 2021-11-13 DIAGNOSIS — E785 Hyperlipidemia, unspecified: Secondary | ICD-10-CM | POA: Diagnosis present

## 2021-11-13 DIAGNOSIS — Z886 Allergy status to analgesic agent status: Secondary | ICD-10-CM | POA: Diagnosis not present

## 2021-11-13 DIAGNOSIS — U071 COVID-19: Secondary | ICD-10-CM | POA: Diagnosis present

## 2021-11-13 DIAGNOSIS — Z885 Allergy status to narcotic agent status: Secondary | ICD-10-CM | POA: Diagnosis not present

## 2021-11-13 DIAGNOSIS — M79604 Pain in right leg: Secondary | ICD-10-CM | POA: Diagnosis present

## 2021-11-13 DIAGNOSIS — R1310 Dysphagia, unspecified: Secondary | ICD-10-CM | POA: Diagnosis present

## 2021-11-13 DIAGNOSIS — I2510 Atherosclerotic heart disease of native coronary artery without angina pectoris: Secondary | ICD-10-CM | POA: Diagnosis present

## 2021-11-13 DIAGNOSIS — Z8673 Personal history of transient ischemic attack (TIA), and cerebral infarction without residual deficits: Secondary | ICD-10-CM | POA: Diagnosis not present

## 2021-11-13 DIAGNOSIS — Z8249 Family history of ischemic heart disease and other diseases of the circulatory system: Secondary | ICD-10-CM | POA: Diagnosis not present

## 2021-11-13 DIAGNOSIS — M4856XA Collapsed vertebra, not elsewhere classified, lumbar region, initial encounter for fracture: Secondary | ICD-10-CM | POA: Diagnosis present

## 2021-11-13 DIAGNOSIS — Z7951 Long term (current) use of inhaled steroids: Secondary | ICD-10-CM | POA: Diagnosis not present

## 2021-11-13 DIAGNOSIS — Z87442 Personal history of urinary calculi: Secondary | ICD-10-CM | POA: Diagnosis not present

## 2021-11-13 DIAGNOSIS — J45909 Unspecified asthma, uncomplicated: Secondary | ICD-10-CM | POA: Diagnosis present

## 2021-11-13 DIAGNOSIS — Z880 Allergy status to penicillin: Secondary | ICD-10-CM | POA: Diagnosis not present

## 2021-11-13 DIAGNOSIS — R079 Chest pain, unspecified: Secondary | ICD-10-CM | POA: Diagnosis present

## 2021-11-13 DIAGNOSIS — Z66 Do not resuscitate: Secondary | ICD-10-CM | POA: Diagnosis present

## 2021-11-13 LAB — BASIC METABOLIC PANEL
Anion gap: 8 (ref 5–15)
BUN: 16 mg/dL (ref 8–23)
CO2: 28 mmol/L (ref 22–32)
Calcium: 10 mg/dL (ref 8.9–10.3)
Chloride: 103 mmol/L (ref 98–111)
Creatinine, Ser: 0.85 mg/dL (ref 0.61–1.24)
GFR, Estimated: >60 mL/min (ref >60)
Glucose, Bld: 116 mg/dL — ABNORMAL HIGH (ref 70–99)
Potassium: 5.5 mmol/L — ABNORMAL HIGH (ref 3.5–5.1)
Sodium: 139 mmol/L (ref 135–145)

## 2021-11-13 LAB — CBC
HCT: 43 % (ref 39.0–52.0)
Hemoglobin: 14.4 g/dL (ref 13.0–17.0)
MCH: 33.7 pg (ref 26.0–34.0)
MCHC: 33.5 g/dL (ref 30.0–36.0)
MCV: 100.7 fL — ABNORMAL HIGH (ref 80.0–100.0)
Platelets: 248 10*3/uL (ref 150–400)
RBC: 4.27 MIL/uL (ref 4.22–5.81)
RDW: 11.9 % (ref 11.5–15.5)
WBC: 4.8 10*3/uL (ref 4.0–10.5)
nRBC: 0 % (ref 0.0–0.2)

## 2021-11-13 LAB — POTASSIUM: Potassium: 4.2 mmol/L (ref 3.5–5.1)

## 2021-11-13 LAB — TSH: TSH: 1.015 u[IU]/mL (ref 0.350–4.500)

## 2021-11-13 LAB — RESP PANEL BY RT-PCR (FLU A&B, COVID) ARPGX2
Influenza A by PCR: NEGATIVE
Influenza B by PCR: NEGATIVE
SARS Coronavirus 2 by RT PCR: POSITIVE — AB

## 2021-11-13 LAB — MRSA NEXT GEN BY PCR, NASAL: MRSA by PCR Next Gen: NOT DETECTED

## 2021-11-13 LAB — TROPONIN I (HIGH SENSITIVITY): Troponin I (High Sensitivity): 4 ng/L (ref <18)

## 2021-11-13 IMAGING — US US EXTREM LOW VENOUS
1 series · 14 of 24 positions shown · non-contrast
Comparison: None.

CLINICAL DATA: Bilateral lower extremity pain

EXAM:
BILATERAL LOWER EXTREMITY VENOUS DOPPLER ULTRASOUND
TECHNIQUE: Gray-scale sonography with compression, as well as color and duplex
ultrasound, were performed to evaluate the deep venous system(s)
from the level of the common femoral vein through the popliteal and
proximal calf veins.

[Series 1: us venous img lower bilat (dvt) · portal-venous · 14 of 64 slices shown]
[im 1/64]
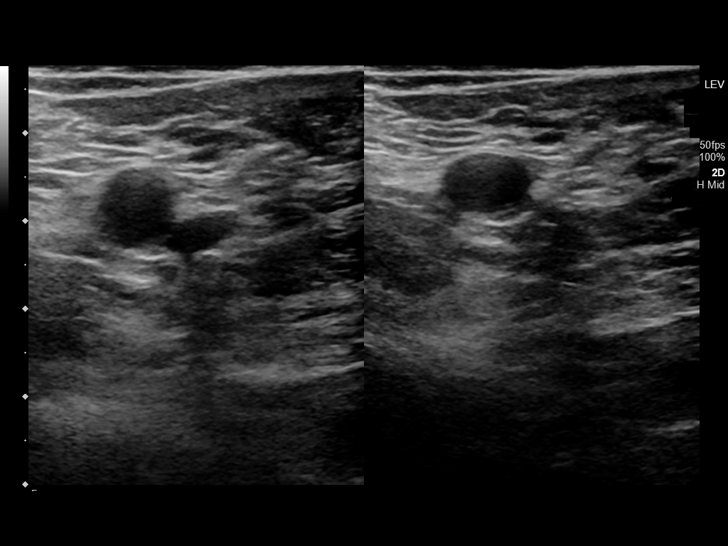
[im 6/64]
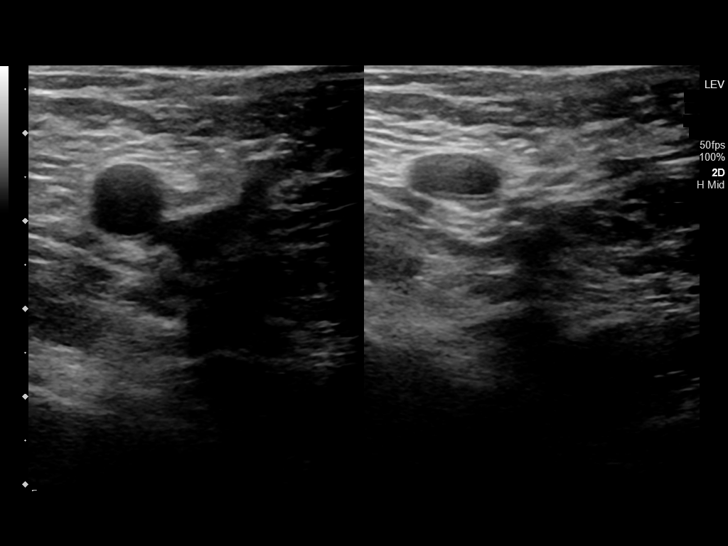
[im 11/64]
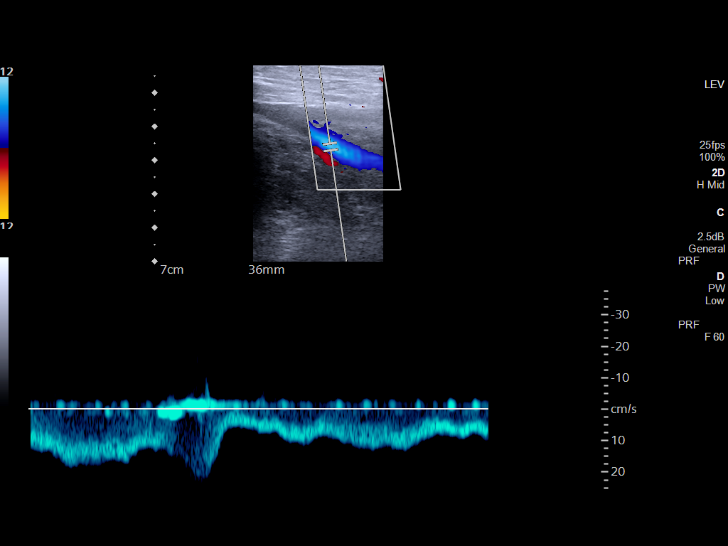
[im 17/64]
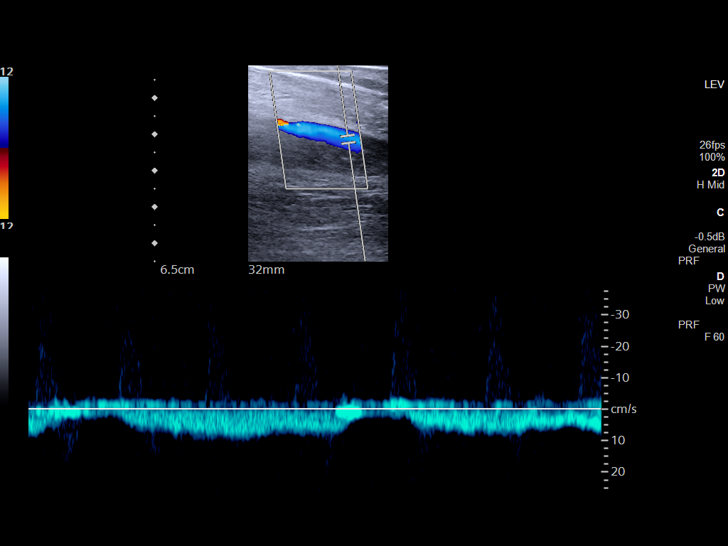
[im 20/64]
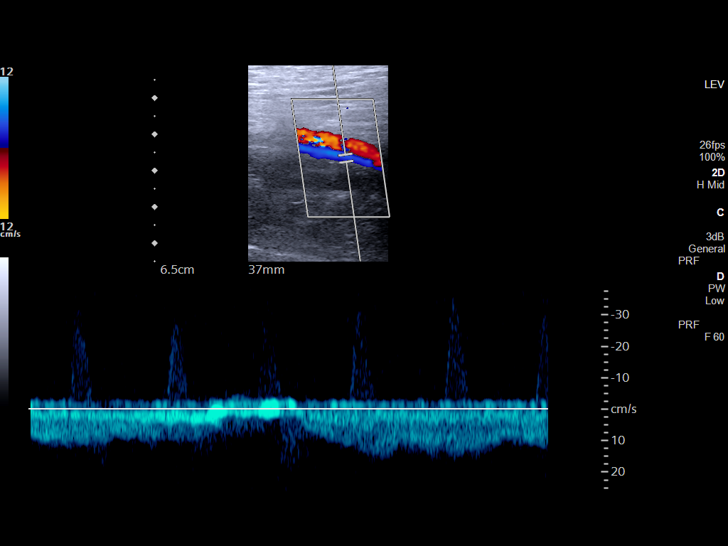
[im 25/64]
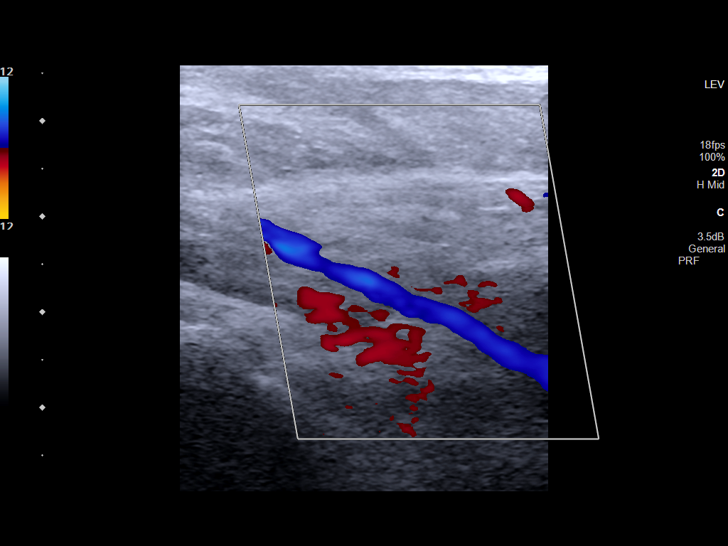
[im 31/64]
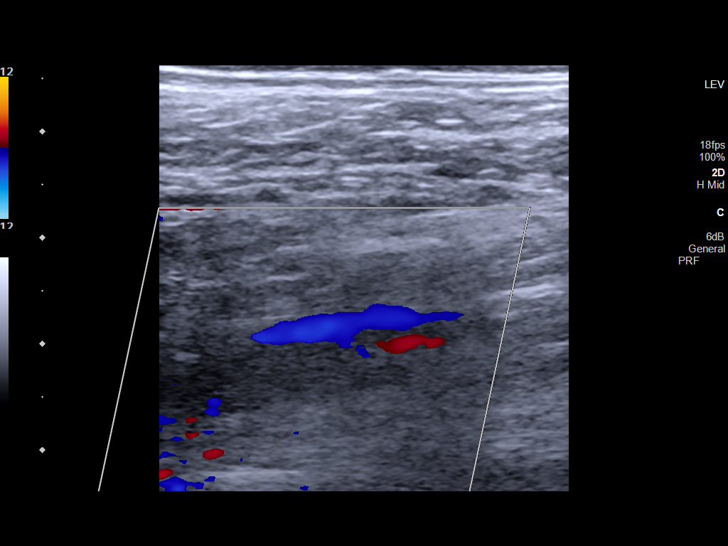
[im 33/64]
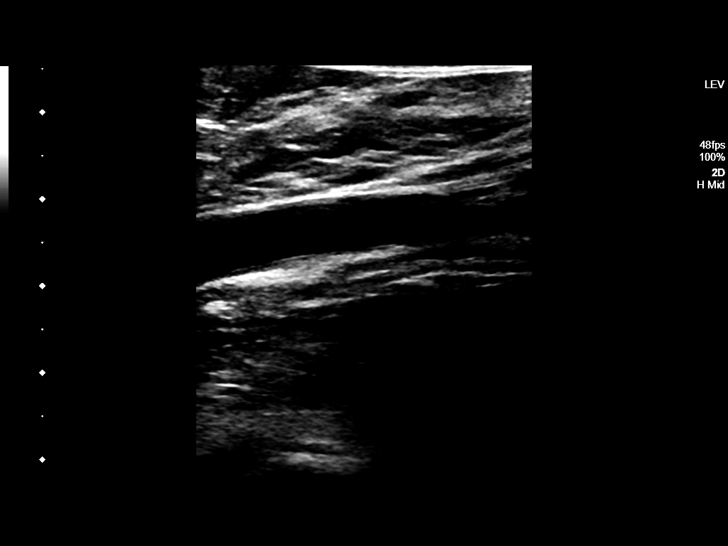
[im 39/64]
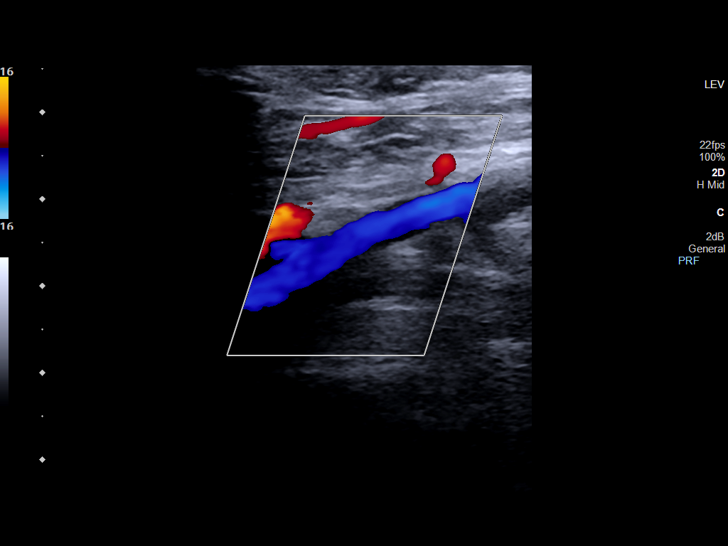
[im 44/64]
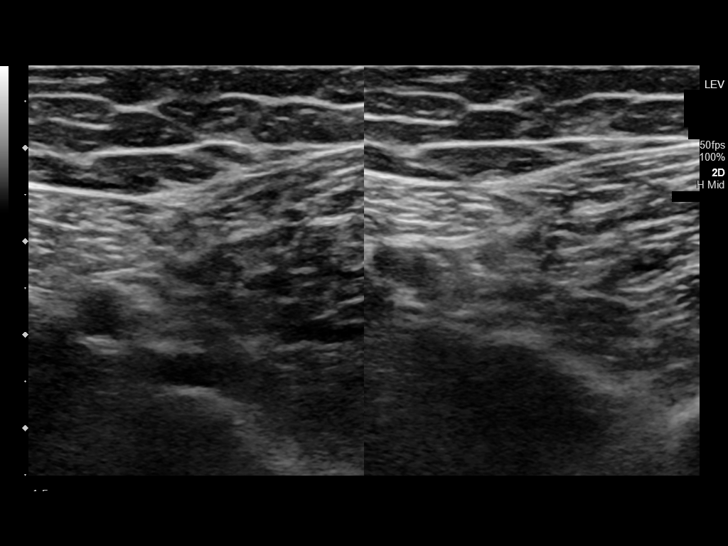
[im 50/64]
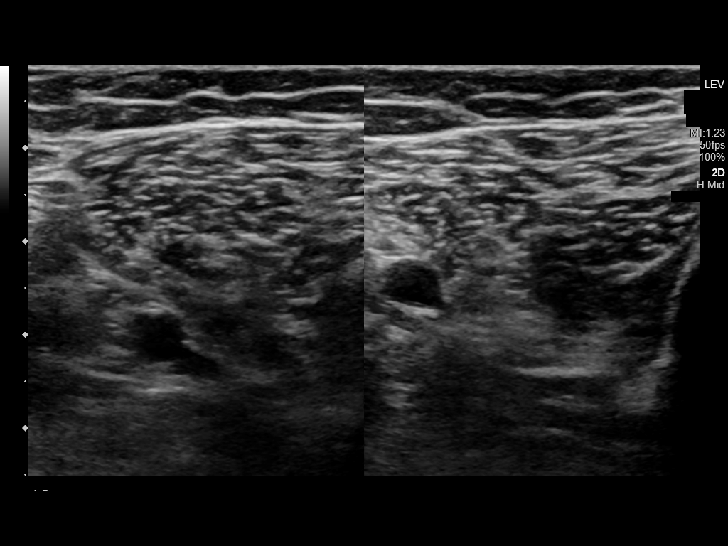
[im 53/64]
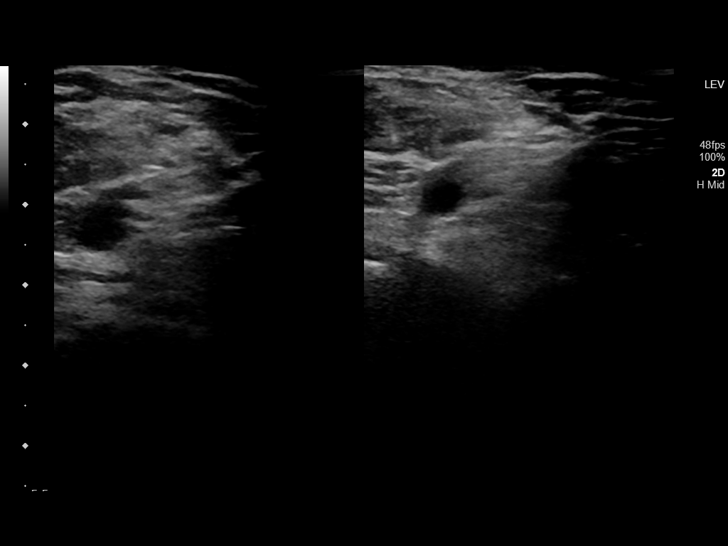
[im 58/64]
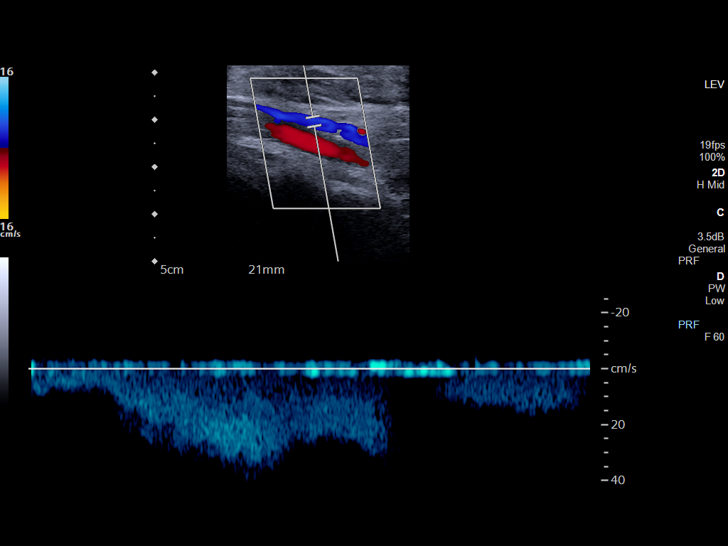
[im 64/64]
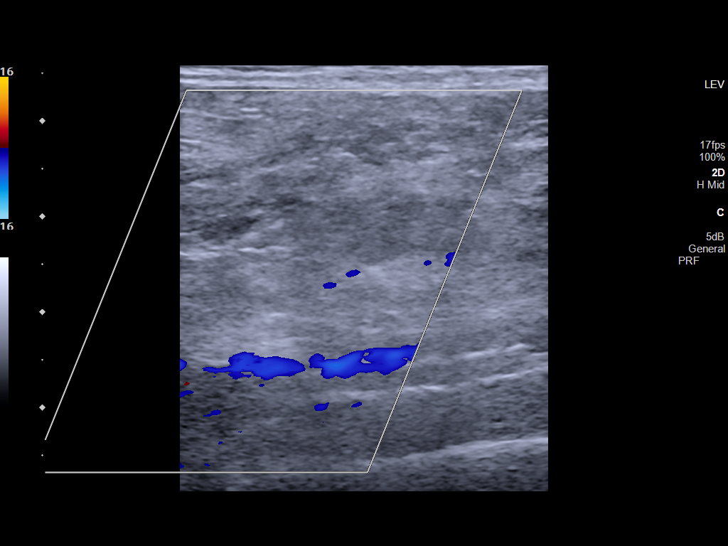

[14 of 24 positions shown; findings below may reference images not displayed]

FINDINGS: VENOUS

Normal compressibility of the common femoral, superficial femoral,
and popliteal veins, as well as the visualized calf veins.
Visualized portions of profunda femoral vein and great saphenous
vein unremarkable. No filling defects to suggest DVT on grayscale or
color Doppler imaging. Doppler waveforms show normal direction of
venous flow, normal respiratory plasticity and response to
augmentation.

OTHER

None.

Limitations: none
IMPRESSION: No lower extremity DVT

## 2021-11-13 MED ORDER — SODIUM CHLORIDE 0.9 % IV SOLN: INTRAVENOUS | Status: DC | PRN

## 2021-11-13 MED ORDER — SODIUM CHLORIDE 0.9 % IV SOLN
100.0000 mg | Freq: Every day | INTRAVENOUS | Status: DC
  Administered 2021-11-14: 11:00:00 100 mg via INTRAVENOUS
  Filled 2021-11-13: qty 20
  Filled 2021-11-13: qty 100

## 2021-11-13 MED ORDER — METHYLPREDNISOLONE SODIUM SUCC 125 MG IJ SOLR
80.0000 mg | INTRAMUSCULAR | Status: DC
  Administered 2021-11-13 – 2021-11-14 (×2): 80 mg via INTRAVENOUS
  Filled 2021-11-13 (×2): qty 2

## 2021-11-13 MED ORDER — MORPHINE SULFATE (PF) 2 MG/ML IV SOLN
2.0000 mg | INTRAVENOUS | Status: DC | PRN
  Filled 2021-11-13: qty 1

## 2021-11-13 MED ORDER — FENTANYL CITRATE PF 50 MCG/ML IJ SOSY
50.0000 ug | PREFILLED_SYRINGE | Freq: Once | INTRAMUSCULAR | Status: AC
  Administered 2021-11-13: 50 ug via INTRAVENOUS
  Filled 2021-11-13: qty 1

## 2021-11-13 MED ORDER — OXYCODONE HCL 5 MG/5ML PO SOLN
5.0000 mg | ORAL | Status: DC | PRN
  Administered 2021-11-13 – 2021-11-14 (×5): 5 mg via ORAL
  Filled 2021-11-13 (×5): qty 5

## 2021-11-13 MED ORDER — SODIUM CHLORIDE 0.9 % IV SOLN
200.0000 mg | Freq: Once | INTRAVENOUS | Status: AC
  Administered 2021-11-13: 200 mg via INTRAVENOUS
  Filled 2021-11-13: qty 200

## 2021-11-13 MED ORDER — GUAIFENESIN-DM 100-10 MG/5ML PO SYRP
5.0000 mL | ORAL_SOLUTION | ORAL | Status: DC | PRN
  Administered 2021-11-13 – 2021-11-14 (×2): 5 mL via ORAL
  Filled 2021-11-13 (×2): qty 5

## 2021-11-13 MED ORDER — METHYLPREDNISOLONE SODIUM SUCC 40 MG IJ SOLR: 40.0000 mg | Freq: Two times a day (BID) | INTRAMUSCULAR | Status: DC

## 2021-11-13 NOTE — TOC Initial Note (Signed)
Transition of Care [facility]) - Initial/Assessment Note    Patient Details  Name: Shawn [name] MRN: [id number] Date of Birth: August 23, 1961  Transition of Care [facility]) CM/SW Contact:    Shawn Ivan, LCSW Phone Number: 11/13/2021, 3:08 PM  Clinical Narrative:               Per PT, patient has a brace that he needs to wear before PT can attempt to ambulate him, and the brace is at [facility] where patient came from. He was at [facility] for STR.    CSW spoke with patient via phone due to Clint isolation.   Patient confirmed he came from [facility]. Patient stated he lives alone at baseline and his sister is supportive/drives him to appointments.PCP is [employer].   Patient said his sister should be able to get his back brace from [facility]. Patient did not have his sister's phone number. Patient said his sister should be visiting tomorrow and bringing him his phone. Patient gave permission for CSW to call his mother. CSW attempted call to patient's mother. No answer and no VM option.   Patient stated he prefers to go home with Home Health rather than return to SNF. If he does go to SNF, he would like to look into going somewhere else besides [facility] if possible. PT to reeval once patient has his brace.  CSW also left a VM for Tammy at [facility] to inquire if she has patient's sisters number or has someone who could bring the brace to the hospital. Waiting on response.    Expected Discharge Plan: [place] Barriers to Discharge: Continued Medical Work up   Patient Goals and CMS Choice Patient states their goals for this hospitalization and ongoing recovery are:: prefers to go home with home health CMS Medicare.gov Compare Post Acute Care list provided to:: Patient Choice offered to / list presented to : Patient  Expected Discharge Plan and Services Expected Discharge Plan: [place]       Living arrangements for the past 2 months: [place],  [place]                                      Prior Living Arrangements/Services Living arrangements for the past 2 months: [place], [place] Lives with:: Self Patient language and need for interpreter reviewed:: Yes Do you feel safe going back to the place where you live?: Yes      Need for Family Participation in Patient Care: Yes (Comment) Care giver support system in place?: Yes (comment)   Criminal Activity/Legal Involvement Pertinent to Current Situation/Hospitalization: No - Comment as needed  Activities of Daily Living Home Assistive Devices/Equipment: [place] Rile (specify type) ADL Screening (condition at time of admission) Patient's cognitive ability adequate to safely complete daily activities?: Yes Is the patient deaf or have difficulty hearing?: No Does the patient have difficulty seeing, even when wearing glasses/contacts?: No Does the patient have difficulty concentrating, remembering, or making decisions?: No Patient able to express need for assistance with ADLs?: Yes Does the patient have difficulty dressing or bathing?: Yes Independently performs ADLs?: No Does the patient have difficulty walking or climbing stairs?: Yes Weakness of Legs: Both Weakness of Arms/Hands: Both  Permission Sought/Granted Permission sought to share information with : Facility [name] and exercise psychologist, Family Supports Permission granted to share information with : Yes,  Verbal Permission Granted  Share Information with NAME: Shawn [name] or sister  Permission granted to share info w AGENCY: SNFs, [place], DME as needed        Emotional Assessment       Orientation: : Oriented to Self, Oriented to Place, Oriented to  Time, Oriented to Situation Alcohol / Substance Use: Not Applicable Psych Involvement: No (comment)  Admission diagnosis:  Leg pain [M79.606] Acute hypoxemic respiratory failure ([place]) [J96.01] Acute hypoxemic respiratory failure due to  COVID-19 ([place]) [U07.1, J96.01] Patient Active Problem List   Diagnosis Date Noted   Right leg pain 11/13/2021   COVID-19 virus infection 11/13/2021   Acute hypoxemic respiratory failure due to COVID-19 ([place]) 11/13/2021   Acute respiratory failure with hypoxia ([place]) 11/12/2021   Compression fracture of L2 ([place]) 11/07/2021   Lumbago 11/07/2021   Pain in both lower legs 11/07/2021   ALS (amyotrophic lateral sclerosis) ([place]) 11/07/2021   Asthma, chronic 11/07/2021   Ureteral stone 05/20/2015   PCP:  [employer], [facility] Pharmacy:   [id number] - Closed - [place], [place] [address] [place] [address] Phone: [phone] Fax: [phone]  [id number] - [place], [place] [address] [place] [address] Phone: [phone] Fax: [phone]     Social Determinants of Health (SDOH) Interventions    Readmission Risk Interventions No flowsheet data found.

## 2021-11-13 NOTE — Evaluation (Signed)
Physical Therapy Evaluation Patient Details Name: Shawn [name] MRN: [id number] DOB: Aug 04, 1961 Today's Date: 11/13/2021  History of Present Illness  Patient is a 61 yo male that presented to the ED from [facility] for back and chest pain. PMH of ALS, asthma, CAD, HLD, CVA, recent fall with L2 compression fx.   Clinical Impression  Patient alert, agreeable to PT, reported he has had significant back pain and has been waiting for pain medication for a long time. RN contacted, and aware of patient status. Per epatient he was recently at [facility], but prior to that lived alone, modI. Did state he was interested in looking into an RN/aide and that his sister may be able to check in on him at discharge if able.  The patient reported wet linens due to voiding, session focused on functional activities to improve pt comfort. Supine <> sit with CGA, reliant on bed rails. Good sitting balance noted, pt able to perform most of a bedside bath with minA, maxA for new socks to be donned as well. Sit <> stand with RW and CGA, effortful for pt but able to maintain standing with CGA, maxA for pericare to ensure cleanliness. Further ambulation/mobility deferred due to increased pain (pt declined recliner), but no LSO available. Returned to supine with all needs in reach.  Overall the patient demonstrated deficits (see "PT Problem List") that impede the patient's functional abilities, safety, and mobility and would benefit from skilled PT intervention. Recommendation at this time is SNF due to current level of assistance needed pending further pt progress. CSW and MD notified of need of LSO for ambulation.       Recommendations for follow up therapy are one component of a multi-disciplinary discharge planning process, led by the attending physician.  Recommendations may be updated based on patient status, additional functional criteria and insurance authorization.  Follow Up Recommendations Skilled nursing-short  term rehab (<3 hours/day)    Assistance Recommended at Discharge Frequent or constant Supervision/Assistance  Patient can return home with the following  A lot of help with walking and/or transfers;A lot of help with bathing/dressing/bathroom;Assist for transportation;Help with stairs or ramp for entrance;Assistance with cooking/housework    Equipment Recommendations BSC/3in1  Recommendations for Other Services       Functional Status Assessment Patient has had a recent decline in their functional status and demonstrates the ability to make significant improvements in function in a reasonable and predictable amount of time.     Precautions / Restrictions Precautions Precautions: Fall;Back Required Braces or Orthoses: Spinal Brace (LSO) Spinal Brace: Lumbar corset Restrictions Weight Bearing Restrictions: No Other Position/Activity Restrictions: LSO with ambulation      Mobility  Bed Mobility Overal bed mobility: Needs Assistance Bed Mobility: Supine to Sit, Sit to Supine     Supine to sit: Min guard, HOB elevated Sit to supine: Min guard, HOB elevated   General bed mobility comments: Log roll training provided with minimal verbal cues    Transfers Overall transfer level: Needs assistance Equipment used: Rolling walker (2 wheels) Transfers: Sit to/from Stand Sit to Stand: Min guard           General transfer comment: pt did fatigue quickly    Ambulation/Gait               General Gait Details: unable to ambulate due to lack of LSO in room  Stairs            Wheelchair Mobility    Modified Rankin (Stroke Patients  Only)       Balance Overall balance assessment: Needs assistance Sitting-balance support: Feet supported Sitting balance-Leahy Scale: Good     Standing balance support: Reliant on assistive device for balance Standing balance-Leahy Scale: Fair                               Pertinent Vitals/Pain Pain  Assessment Pain Assessment: 0-10 Pain Score: 8  Pain Location: low back/groin Pain Descriptors / Indicators: Aching, Sore, Grimacing, Guarding Pain Intervention(s): Monitored during session, Repositioned, Patient requesting pain meds-RN notified    Home Living Family/patient expects to be discharged to:: Private residence Living Arrangements: Alone Available Help at Discharge:  (pt stated his sister could be checking on him, and interested in having a nurse check in) Type of Home: Apartment Home Access: Ramped entrance       Home Layout: One level Home Equipment: Rolling Walker (2 wheels);Grab bars - tub/shower      Prior Function Prior Level of Function : Independent/Modified Independent             Mobility Comments: Mod Ind with amb community distances with a RW, 2 total falls including current fall in the last 6 months both from LOB ADLs Comments: Ind with ADLs     Hand Dominance        Extremity/Trunk Assessment   Upper Extremity Assessment Upper Extremity Assessment: Generalized weakness (able to move against gravity)    Lower Extremity Assessment Lower Extremity Assessment: Generalized weakness (able to move against gravity)    Cervical / Trunk Assessment Cervical / Trunk Assessment: Kyphotic  Communication   Communication: Other (comment) (soft spoken)  Cognition Arousal/Alertness: Awake/alert Behavior During Therapy: WFL for tasks assessed/performed Overall Cognitive Status: Within Functional Limits for tasks assessed                                          General Comments      Exercises     Assessment/Plan    PT Assessment Patient needs continued PT services  PT Problem List Decreased strength;Decreased activity tolerance;Decreased balance;Decreased mobility;Decreased knowledge of use of DME;Decreased knowledge of precautions;Pain       PT Treatment Interventions DME instruction;Gait training;Functional mobility  training;Therapeutic activities;Therapeutic exercise;Neuromuscular re-education    PT Goals (Current goals can be found in the Care Plan section)  Acute Rehab PT Goals Patient Stated Goal: to go home PT Goal Formulation: With patient Time For Goal Achievement: 11/27/21 Potential to Achieve Goals: Good    Frequency Min 2X/week     Co-evaluation               AM-PAC PT "6 Clicks" Mobility  Outcome Measure Help needed turning from your back to your side while in a flat bed without using bedrails?: A Little Help needed moving from lying on your back to sitting on the side of a flat bed without using bedrails?: A Little Help needed moving to and from a bed to a chair (including a wheelchair)?: A Little Help needed standing up from a chair using your arms (e.g., wheelchair or bedside chair)?: A Little Help needed to walk in hospital room?: A Lot Help needed climbing 3-5 steps with a railing? : A Lot 6 Click Score: 16    End of Session Equipment Utilized During Treatment: Gait belt Activity Tolerance: Patient limited by  pain Patient left: in bed;with call bell/phone within reach;with bed alarm set Nurse Communication: Mobility status PT Visit Diagnosis: Unsteadiness on feet (R26.81);History of falling (Z91.81);Difficulty in walking, not elsewhere classified (R26.2);Pain Pain - Right/Left:  (midline) Pain - part of body:  (lower back)    Time: 1093-2355 PT Time Calculation (min) (ACUTE ONLY): 31 min   Charges:   PT Evaluation $PT Eval Low Complexity: 1 Low PT Treatments $Therapeutic Activity: 23-37 mins        Olga Coaster PT, DPT 1:21 PM,11/13/21

## 2021-11-13 NOTE — Hospital Course (Signed)
Shawn [name] is a 61 year old male with history of ALS, compression fracture, chronic asthma, presents emergency department for chief concerns of chest pain and back pain.  Vitals in the emergency department show temperature of 99.2, respiration rate of 22, heart rate of 103, blood pressure 128/82, SPO2 of 87% on room air.  Labs in the emergency department showed serum sodium 134, potassium 3.8, chloride 103, bicarb 25, BUN 14, serum creatinine of 0.78, nonfasting blood glucose 140, GFR greater than 60, WBC 6, hemoglobin 14, platelets of 243.  COVID/influenza A/intensive B PCR pending.  High sensitive troponin was 3.  ED treatment: Fentanyl 50 mcg one-time dose IV.

## 2021-11-13 NOTE — Assessment & Plan Note (Signed)
-   Patient denies known trauma - Bilateral lower extremity ultrasound ordered to assess for DVT

## 2021-11-13 NOTE — Progress Notes (Signed)
PROGRESS NOTE    Westley GamblesRoger E Goodhart  WJX:[id number] DOB: 1961-07-02 DOA: 11/12/2021 [facility]    Brief Narrative:   Mr. Marta AntuRoger Delmonico is a 61 year old male with history of ALS, compression fracture, chronic asthma, presents emergency department for chief concerns of chest pain and back pain.  He has a negative troponin.  CT chest did not show PE, bilateral lower extremity ultrasound showed no DVT.  He was found to be COVID-positive.  He developed hypoxemia with oxygen saturation 87%.  Treated with remdesivir and steroids.   Assessment & Plan:   Principal Problem:   Acute hypoxemic respiratory failure (HCC) Active Problems:   Compression fracture of L2 (HCC)   ALS (amyotrophic lateral sclerosis) (HCC)   Asthma, chronic   Right leg pain  Acute hypoxemic respiratory failure secondary to COVID infection. COVID infection. Patient has a high risk for deterioration, started steroids and remdesivir.  ALS Dysphagia from ALS. Patient had NO history of aspiration.  But he has some dysphagia. Continue to follow. Patient states that he was prescribed trilogy, but not able to use it in nursing home.  Given COVID infection, not sure we can start a BiPAP at this time.  Chronic pain disorder  Compression fracture of L2. Patient is complaining of pain in his back, chest, arms and the legs. No evidence of cardiac pain, no PE, no DVT  Start oral pain medicine for now.    DVT prophylaxis: Lovenox Code Status: full Family Communication:  Disposition Plan:    Status is: Observation The patient will require care spanning > 2 midnights and should be moved to inpatient because: Severity of disease, requiring IV treatment.     No intake/output data recorded. No intake/output data recorded.     Consultants:  None  Procedures: None  Antimicrobials: None  Subjective: Patient still has some short of breath, oxygenation is better. Cough, nonproductive  He is  complaining of pain all over his body, including chest, back, legs and arms.  Objective: Vitals:   11/13/21 0100 11/13/21 0130 11/13/21 0325 11/13/21 0841  BP: 114/75 125/80 119/80 118/72  Pulse: 78 80 71 84  Resp: 17 19 20 18   Temp:   99 F (37.2 C) 97.8 F (36.6 C)  TempSrc:   Oral   SpO2: 95% 96% 96% 94%  Weight:      Height:       No intake or output data in the 24 hours ending 11/13/21 1026 Filed Weights   11/12/21 2033  Weight: 61.7 kg    Examination:  General exam: Appears calm and comfortable  Respiratory system: Clear to auscultation. Respiratory effort normal. Cardiovascular system: S1 & S2 heard, RRR. No JVD, murmurs, rubs, gallops or clicks. No pedal edema. Gastrointestinal system: Abdomen is nondistended, soft and nontender. No organomegaly or masses felt. Normal bowel sounds heard. Central nervous system: Alert and oriented. No focal neurological deficits. Extremities: Symmetric 5 x 5 power. Skin: No rashes, lesions or ulcers Psychiatry: Judgement and insight appear normal. Mood & affect appropriate.     Data Reviewed: I have personally reviewed following labs and imaging studies  CBC: Recent Labs  Lab 11/07/21 0931 11/08/21 0327 11/09/21 0405 11/12/21 2037 11/13/21 0435  WBC 7.3 6.7 5.4 6.0 4.8  NEUTROABS 4.8  --  2.6  --   --   HGB 12.5* 11.5* 11.9* 14.0 14.4  HCT 37.3* 34.2* 35.1* 41.0 43.0  MCV 100.8* 100.9* 100.9* 99.3 100.7*  PLT 223 204 192 243 248  Basic Metabolic Panel: Recent Labs  Lab 11/07/21 0931 11/08/21 0327 11/09/21 0405 11/12/21 2037 11/13/21 0435 11/13/21 0800  NA 136 136 138 134* 139  --   K 3.6 3.6 3.7 3.8 5.5* 4.2  CL 106 106 107 103 103  --   CO2 25 26 26 25 28   --   GLUCOSE 106* 95 105* 140* 116*  --   BUN 15 13 9 14 16   --   CREATININE 0.74 0.65 0.74 0.78 0.85  --   CALCIUM 9.2 9.0 9.0 9.5 10.0  --    GFR: Estimated Creatinine Clearance: 78.6 mL/min (by C-G formula based on SCr of 0.85 mg/dL). Liver  Function Tests: Recent Labs  Lab 11/07/21 0931 11/09/21 0405  AST 23 20  ALT 25 20  ALKPHOS 87 76  BILITOT 0.8 0.8  PROT 6.6 5.9*  ALBUMIN 4.0 3.3*   No results for input(s): LIPASE, AMYLASE in the last 168 hours. No results for input(s): AMMONIA in the last 168 hours. Coagulation Profile: No results for input(s): INR, PROTIME in the last 168 hours. Cardiac Enzymes: Recent Labs  Lab 11/07/21 0931  CKTOTAL 79   BNP (last 3 results) No results for input(s): PROBNP in the last 8760 hours. HbA1C: No results for input(s): HGBA1C in the last 72 hours. CBG: No results for input(s): GLUCAP in the last 168 hours. Lipid Profile: No results for input(s): CHOL, HDL, LDLCALC, TRIG, CHOLHDL, LDLDIRECT in the last 72 hours. Thyroid Function Tests: Recent Labs    11/12/21 2037  TSH 1.015   Anemia Panel: No results for input(s): VITAMINB12, FOLATE, FERRITIN, TIBC, IRON, RETICCTPCT in the last 72 hours. Sepsis Labs: No results for input(s): PROCALCITON, LATICACIDVEN in the last 168 hours.  Recent Results (from the past 240 hour(s))  Resp Panel by RT-PCR (Flu A&B, Covid) Nasopharyngeal Swab     Status: None   Collection Time: 11/07/21 11:18 AM   Specimen: Nasopharyngeal Swab; Nasopharyngeal(NP) swabs in vial transport medium  Result Value Ref Range Status   SARS Coronavirus 2 by RT PCR NEGATIVE NEGATIVE Final    Comment: (NOTE) SARS-CoV-2 target nucleic acids are NOT DETECTED.  The SARS-CoV-2 RNA is generally detectable in upper respiratory specimens during the acute phase of infection. The lowest concentration of SARS-CoV-2 viral copies this assay can detect is 138 copies/mL. A negative result does not preclude SARS-Cov-2 infection and should not be used as the sole basis for treatment or other patient management decisions. A negative result may occur with  improper specimen collection/handling, submission of specimen other than nasopharyngeal swab, presence of viral  mutation(s) within the areas targeted by this assay, and inadequate number of viral copies(<138 copies/mL). A negative result must be combined with clinical observations, patient history, and epidemiological information. The expected result is Negative.  Fact Sheet for Patients:  BloggerCourse.comhttps://www.fda.gov/media/152166/download  Fact Sheet for Healthcare Providers:  SeriousBroker.ithttps://www.fda.gov/media/152162/download  This test is no t yet approved or cleared by the [place] FDA and  has been authorized for detection and/or diagnosis of SARS-CoV-2 by FDA under an Emergency Use Authorization (EUA). This EUA will remain  in effect (meaning this test can be used) for the duration of the COVID-19 declaration under Section 564(b)(1) of the Act, 21 U.S.C.section 360bbb-3(b)(1), unless the authorization is terminated  or revoked sooner.       Influenza A by PCR NEGATIVE NEGATIVE Final   Influenza B by PCR NEGATIVE NEGATIVE Final    Comment: (NOTE) The Xpert Xpress SARS-CoV-2/FLU/RSV plus assay is intended  as an aid in the diagnosis of influenza from Nasopharyngeal swab specimens and should not be used as a sole basis for treatment. Nasal washings and aspirates are unacceptable for Xpert Xpress SARS-CoV-2/FLU/RSV testing.  Fact Sheet for Patients: BloggerCourse.com  Fact Sheet for Healthcare Providers: SeriousBroker.it  This test is not yet approved or cleared by the [place] FDA and has been authorized for detection and/or diagnosis of SARS-CoV-2 by FDA under an Emergency Use Authorization (EUA). This EUA will remain in effect (meaning this test can be used) for the duration of the COVID-19 declaration under Section 564(b)(1) of the Act, 21 U.S.C. section 360bbb-3(b)(1), unless the authorization is terminated or revoked.  Performed at [facility], [address] [place], [place] [address]   Resp Panel by RT-PCR (Flu A&B,  Covid) Nasopharyngeal Swab     Status: Abnormal   Collection Time: 11/12/21 11:02 PM   Specimen: Nasopharyngeal Swab; Nasopharyngeal(NP) swabs in vial transport medium  Result Value Ref Range Status   SARS Coronavirus 2 by RT PCR POSITIVE (A) NEGATIVE Final    Comment: (NOTE) SARS-CoV-2 target nucleic acids are DETECTED.  The SARS-CoV-2 RNA is generally detectable in upper respiratory specimens during the acute phase of infection. Positive results are indicative of the presence of the identified virus, but do not rule out bacterial infection or co-infection with other pathogens not detected by the test. Clinical correlation with patient history and other diagnostic information is necessary to determine patient infection status. The expected result is Negative.  Fact Sheet for Patients: BloggerCourse.com  Fact Sheet for Healthcare Providers: SeriousBroker.it  This test is not yet approved or cleared by the [place] FDA and  has been authorized for detection and/or diagnosis of SARS-CoV-2 by FDA under an Emergency Use Authorization (EUA).  This EUA will remain in effect (meaning this test can be used) for the duration of  the COVID-19 declaration under Section 564(b)(1) of the A ct, 21 U.S.C. section 360bbb-3(b)(1), unless the authorization is terminated or revoked sooner.     Influenza A by PCR NEGATIVE NEGATIVE Final   Influenza B by PCR NEGATIVE NEGATIVE Final    Comment: (NOTE) The Xpert Xpress SARS-CoV-2/FLU/RSV plus assay is intended as an aid in the diagnosis of influenza from Nasopharyngeal swab specimens and should not be used as a sole basis for treatment. Nasal washings and aspirates are unacceptable for Xpert Xpress SARS-CoV-2/FLU/RSV testing.  Fact Sheet for Patients: BloggerCourse.com  Fact Sheet for Healthcare Providers: SeriousBroker.it  This test is not  yet approved or cleared by the [place] FDA and has been authorized for detection and/or diagnosis of SARS-CoV-2 by FDA under an Emergency Use Authorization (EUA). This EUA will remain in effect (meaning this test can be used) for the duration of the COVID-19 declaration under Section 564(b)(1) of the Act, 21 U.S.C. section 360bbb-3(b)(1), unless the authorization is terminated or revoked.  Performed at [facility], [address], [place], [place] [address]          Radiology Studies: DG Chest 2 View  Result Date: 11/12/2021 CLINICAL DATA:  A 61 year old male presents with history of chest pain. EXAM: CHEST - 2 VIEW COMPARISON:  Comparison is made with May 19, 2021. FINDINGS: Trachea midline. Cardiomediastinal contours and hilar structures are normal. Increased density in the retrocardiac region. No frank lobar consolidation. No visible pneumothorax. No sign of pleural effusion. On limited assessment there is no acute skeletal process. Extensive gaseous distension of the colon with scattered loops of small  bowel in the upper abdomen as well. IMPRESSION: Increased density in the retrocardiac region, findings suspicious for developing pneumonia with added density over the spine on the lateral projection. Gaseous distension of both large and small bowel in the abdomen. Correlate with any abdominal symptoms and with evaluation with dedicated abdominal assessment as warranted. Electronically Signed   By: Donzetta Kohut M.D.   On: 11/12/2021 21:14   CT Chest Wo Contrast  Result Date: 11/12/2021 CLINICAL DATA:  Respiratory distress. EXAM: CT CHEST WITHOUT CONTRAST TECHNIQUE: Multidetector CT imaging of the chest was performed following the standard protocol without IV contrast. RADIATION DOSE REDUCTION: This exam was performed according to the departmental dose-optimization program which includes automated exposure control, adjustment of the mA and/or kV according to patient size  and/or use of iterative reconstruction technique. COMPARISON:  Chest radiograph dated 11/12/2021. Chest CT dated 10/18/2020. FINDINGS: Evaluation of this exam is limited in the absence of intravenous contrast. Cardiovascular: There is no cardiomegaly or pericardial effusion. The thoracic aorta and central pulmonary arteries are grossly unremarkable on this noncontrast CT. Mediastinum/Nodes: No hilar or mediastinal adenopathy. The esophagus and the thyroid gland are grossly unremarkable. No mediastinal fluid collection. Lungs/Pleura: Minimal bibasilar dependent subpleural atelectasis. No focal consolidation, pleural effusion, or pneumothorax. There is a 7 mm nodule along the left fissure similar to prior CT, likely a para fissural lymph node or scarring. The central airways are patent. Upper Abdomen: A 2 mm left renal upper pole calculus. No hydronephrosis. Musculoskeletal: No chest wall mass or suspicious bone lesions identified. IMPRESSION: 1. No acute intrathoracic pathology. 2. A 2 mm left renal upper pole calculus. No hydronephrosis. Electronically Signed   By: Elgie Collard M.D.   On: 11/12/2021 22:41   CT Angio Chest PE W and/or Wo Contrast  Result Date: 11/12/2021 CLINICAL DATA:  Chest pain EXAM: CT ANGIOGRAPHY CHEST WITH CONTRAST TECHNIQUE: Multidetector CT imaging of the chest was performed using the standard protocol during bolus administration of intravenous contrast. Multiplanar CT image reconstructions and MIPs were obtained to evaluate the vascular anatomy. RADIATION DOSE REDUCTION: This exam was performed according to the departmental dose-optimization program which includes automated exposure control, adjustment of the mA and/or kV according to patient size and/or use of iterative reconstruction technique. CONTRAST:  30mL OMNIPAQUE IOHEXOL 350 MG/ML SOLN COMPARISON:  CT chest dated 11/12/2021 at 10:35 p.m. FINDINGS: Cardiovascular: Satisfactory opacification of the bilateral pulmonary arteries  to the segmental level. No evidence of pulmonary embolism. Although not tailored for evaluation of the thoracic aorta, there is no evidence of thoracic aortic aneurysm or dissection. Heart is normal in size.  No pericardial effusion. Mediastinum/Nodes: No suspicious mediastinal lymphadenopathy. Visualized thyroid is unremarkable. Lungs/Pleura: Mild dependent atelectasis in the bilateral lower lobes. No suspicious pulmonary nodules. Mild biapical pleural-parenchymal scarring. No pleural effusion or pneumothorax. Upper Abdomen: Visualized upper abdomen is grossly unremarkable. Musculoskeletal: Visualized osseous structures are within normal limits. Review of the MIP images confirms the above findings. IMPRESSION: No evidence of pulmonary embolism. No evidence of acute cardiopulmonary disease. Electronically Signed   By: Charline Bills M.D.   On: 11/12/2021 23:42   US Venous Img Lower Bilateral (DVT)  Result Date: 11/13/2021 CLINICAL DATA:  Bilateral lower extremity pain EXAM: BILATERAL LOWER EXTREMITY VENOUS DOPPLER ULTRASOUND TECHNIQUE: Gray-scale sonography with compression, as well as color and duplex ultrasound, were performed to evaluate the deep venous system(s) from the level of the common femoral vein through the popliteal and proximal calf veins. COMPARISON:  None. FINDINGS: VENOUS Normal  compressibility of the common femoral, superficial femoral, and popliteal veins, as well as the visualized calf veins. Visualized portions of profunda femoral vein and great saphenous vein unremarkable. No filling defects to suggest DVT on grayscale or color Doppler imaging. Doppler waveforms show normal direction of venous flow, normal respiratory plasticity and response to augmentation. OTHER None. Limitations: none IMPRESSION: No lower extremity DVT Electronically Signed   By: Acquanetta Belling M.D.   On: 11/13/2021 08:32        Scheduled Meds:  atorvastatin  20 mg Oral QHS   baclofen  10 mg Oral TID   [START  ON 11/14/2021] enoxaparin (LOVENOX) injection  40 mg Subcutaneous Q24H   fluticasone furoate-vilanterol  1 puff Inhalation Daily   gabapentin  250 mg Oral QHS   methylPREDNISolone (SOLU-MEDROL) injection  80 mg Intravenous Q24H   montelukast  10 mg Oral QHS   riluzole  50 mg Oral BID   senna-docusate  1 tablet Oral QHS   traZODone  100 mg Oral QHS   Continuous Infusions:  remdesivir 200 mg in sodium chloride 0.9% 250 mL IVPB     Followed by   Melene Muller ON 11/14/2021] remdesivir 100 mg in NS 100 mL       LOS: 0 days    Time spent: 30 minutes    Marrion Coy, MD [facility]   To contact the attending provider between 7A-7P or the covering provider during after hours 7P-7A, please log into the web site [email or web address] and access using universal [place] password for that web site. If you do not have the password, please call the hospital operator.  11/13/2021, 10:26 AM

## 2021-11-13 NOTE — Progress Notes (Signed)
PHARMACIST - PHYSICIAN COMMUNICATION   CONCERNING: Methylprednisolone IV    Current order: Methylprednisolone IV 40 every 12 hours     DESCRIPTION: Per [place] Protocol:   IV methylprednisolone will be converted to either a q12h or q24h frequency with the same total daily dose (TDD).  Ordered Dose: 1 to 125 mg TDD; convert to: TDD q24h.  Ordered Dose: 126 to 250 mg TDD; convert to: TDD div q12h.  Ordered Dose: >250 mg TDD; DAW.  Order has been adjusted to: Methylprednisolone IV 80 every 24 hours   Pernell Dupre , PharmD, BCPS Clinical Pharmacist  11/13/2021 7:51 AM

## 2021-11-13 NOTE — H&P (Addendum)
History and Physical   Shawn [name] [id number] DOB: August 22, 1961 DOA: 11/12/2021  PCP: [facility]  Outpatient Specialists: Dr. Stefani Dama, [place] [facility] Patient coming from: [facility]  I have personally briefly reviewed patient's old medical records in [place].  Chief Concern: Chest pain  HPI: [name] is a 61 year old male with history of ALS, compression fracture, chronic asthma, presents emergency department for chief concerns of chest pain and back pain.  Vitals in the emergency department show temperature of 99.2, respiration rate of 22, heart rate of 103, blood pressure 128/82, SPO2 of 87% on room air.  Labs in the emergency department showed serum sodium 134, potassium 3.8, chloride 103, bicarb 25, BUN 14, serum creatinine of 0.78, nonfasting blood glucose 140, GFR greater than 60, WBC 6, hemoglobin 14, platelets of 243.  COVID/influenza A/intensive B PCR pending.  High sensitive troponin was 3.  ED treatment: Fentanyl 50 mcg one-time dose IV.  He is able to tell me his name, age, and current location.  He reports the chest pain started today, was persistent, and radiated down his left arm. He also endorses cough that started this morning and was productive of brown sputum. He reports he has had this chest pain before about two years ago, when he was diagnosed with a heart arrhythmia.  Social history: He is from [place]. He denies tobacco, etoh, drug use. He formerly worked as a Radio broadcast assistant.   Vaccination history: He is vaccinated for covid 19.  ROS: Constitutional: no weight change, no fever ENT/Mouth: no sore throat, no rhinorrhea Eyes: no eye pain, no vision changes Cardiovascular: + chest pain, no dyspnea,  no edema, no palpitations Respiratory: + cough, + sputum, no wheezing Gastrointestinal: no nausea, no vomiting, no diarrhea, no constipation Genitourinary: no urinary incontinence, no dysuria, no  hematuria Musculoskeletal: no arthralgias, + myalgias Skin: no skin lesions, no pruritus, Neuro: + weakness, no loss of consciousness, no syncope Psych: no anxiety, no depression, + decrease appetite Heme/Lymph: no bruising, no bleeding  ED Course: Discussed with emergency medicine provider, patient required hospitalization for chief concerns of hypoxia.  Assessment/Plan  Principal Problem:   Acute hypoxemic respiratory failure (HCC) Active Problems:   Compression fracture of L2 (HCC)   ALS (amyotrophic lateral sclerosis) (HCC)   Asthma, chronic   Right leg pain   * Acute hypoxemic respiratory failure (HCC) Assessment & Plan - Etiology work-up in progress at this time - CTA of the chest to assess for PE is pending - Continue oxygen supplementation to maintain SPO2 greater than 92% - TOC consulted for possible requirement of O2 supplementation on discharge - Follow-up on second troponin - Admit to telemetry medical, observation  Asthma, chronic Assessment & Plan - Resume home maintenance inhaler, Breo Ellipta 1 elation every morning and albuterol inhalation every 6 hours as needed for shortness of breath and wheezing  ALS (amyotrophic lateral sclerosis) (HCC) Assessment & Plan - Resume home riluzole 50 mg p.o. twice daily  Compression fracture of L2 (HCC) Assessment & Plan - Resume home baclofen 3 times daily, home oxycodone 5 mg immediate release every 4 hours as needed for moderate pain  Right leg pain Assessment & Plan - Patient denies known trauma - Bilateral lower extremity ultrasound ordered to assess for DVT  Chart reviewed.   DVT prophylaxis: Enoxaparin  Code Status: DNR/DNI Diet: Heart healthy Family Communication: Patient states there is no family member he wishes to be called or updated Disposition Plan: Pending clinical  course Consults called: TOC Admission status: Telemetry medical, observation  Past Medical History:  Diagnosis Date   ALS  (amyotrophic lateral sclerosis) ([place])    Asthma    Coronary artery disease    Hyperlipidemia    Renal stone    Stroke [facility])    Past Surgical History:  Procedure Laterality Date   CARDIAC CATHETERIZATION  2006   negative   CYSTOSCOPY W/ URETERAL STENT REMOVAL Left 06/11/2015   Procedure: CYSTOSCOPY WITH STENT EXCHANGE;  Surgeon: Cleon Gustin, MD;  Location: [facility] ORS;  Service: Urology;  Laterality: Left;   CYSTOSCOPY WITH STENT PLACEMENT Left 05/20/2015   Procedure: CYSTOSCOPY WITH STENT PLACEMENT;  Surgeon: Ardis Hughs, MD;  Location: [facility] ORS;  Service: Urology;  Laterality: Left;   CYSTOSCOPY/RETROGRADE/URETEROSCOPY/STONE EXTRACTION WITH BASKET  2000   pt. unsure of which side done   CYSTOSCOPY/URETEROSCOPY/HOLMIUM LASER Left 06/11/2015   Procedure: CYSTOSCOPY/URETEROSCOPY/HOLMIUM LASER;  Surgeon: Cleon Gustin, MD;  Location: [facility] ORS;  Service: Urology;  Laterality: Left;   KNEE SURGERY Right 84,96, 2003   x3   SHOULDER SURGERY Left 2010   Social History:  reports that he has never smoked. He has never used smokeless tobacco. He reports that he does not currently use alcohol. He reports that he does not use drugs.  Allergies  Allergen Reactions   Aspirin Other (See Comments), Itching, Shortness Of Breath and Swelling    Upset stomach  GI Upset   Compazine [Prochlorperazine] Other (See Comments)    Added per pt. Pt had tachycardia chest discomfort   Morphine And Related Itching   Naproxen Other (See Comments)    Upset stomach  GI Upset   Penicillins Rash   Tramadol Itching   Family History  Problem Relation Age of Onset   Cancer Mother    Heart failure Mother    Hypertension Mother    CVA Mother    Diabetes Other    Other Father        unknown medical history   Family history: Family history reviewed and not pertinent  Prior to Admission medications   Medication Sig Start Date End Date Taking? Authorizing Provider  acetaminophen (TYLENOL) 160  MG/5ML solution Take 480 mg by mouth every 6 (six) hours as needed for mild pain or moderate pain.   Yes [provider]  albuterol (VENTOLIN HFA) 108 (90 Base) MCG/ACT inhaler Inhale 1-2 puffs into the lungs every 6 (six) hours as needed for wheezing or shortness of breath.   Yes [provider]  atorvastatin (LIPITOR) 20 MG tablet Take 20 mg by mouth daily. 06/25/20  Yes [provider]  Baclofen 5 MG TABS Take 10 mg by mouth 3 (three) times daily.   Yes [provider]  fluticasone furoate-vilanterol (BREO ELLIPTA) 100-25 MCG/ACT AEPB Inhale 1 puff into the lungs in the morning.   Yes [provider]  gabapentin (NEURONTIN) 250 MG/5ML solution Take 250 mg by mouth at bedtime.   Yes [provider]  montelukast (SINGULAIR) 10 MG tablet Take 10 mg by mouth at bedtime.   Yes [provider]  oxyCODONE (OXY IR/ROXICODONE) 5 MG immediate release tablet Take 1 tablet (5 mg total) by mouth every 4 (four) hours as needed for moderate pain. 11/09/21  Yes Aline August, MD  riluzole (RILUTEK) 50 MG tablet Take 50 mg by mouth 2 (two) times daily.   Yes [provider]  senna-docusate (SENOKOT-S) 8.6-50 MG tablet Take 1 tablet by mouth at bedtime. 11/09/21  Yes Aline August, MD  traZODone (DESYREL) 100 MG tablet Take 100 mg by mouth at bedtime.   Yes [provider]   Physical Exam: Vitals:   11/12/21 2023 11/12/21 2033 11/12/21 2205 11/12/21 2300  BP: 128/82  117/77 119/69  Pulse: (!) 103  80 79  Resp: (!) 22  (!) 22   Temp: 99.2 F (37.3 C)     TempSrc: Oral     SpO2: (!) 87%  96% 97%  Weight:  61.7 kg    Height:  5\' 10"  (1.778 m)     Constitutional: appears older than chronological age, frail, cachectic, NAD, calm, comfortable Eyes: PERRL, lids and conjunctivae normal ENMT: Mucous membranes are moist. Posterior pharynx clear of any exudate or lesions. Age-appropriate dentition. Hearing appropriate Neck: normal,  supple, no masses, no thyromegaly Respiratory: clear to auscultation bilaterally, no wheezing, no crackles. Normal respiratory effort. No accessory muscle use.  Cardiovascular: Regular rate and rhythm, no murmurs / rubs / gallops. No extremity edema. 2+ pedal pulses. No carotid bruits.  Abdomen: no tenderness, no masses palpated, no hepatosplenomegaly. Bowel sounds positive.  Musculoskeletal: no clubbing / cyanosis. No joint deformity upper and lower extremities. Good ROM, no contractures, no atrophy. Normal muscle tone.  Right lower extremity leg pain. Skin: no rashes, lesions, ulcers. No induration Neurologic: Sensation intact. Strength 5/5 in all 4.  Psychiatric: Normal judgment and insight. Alert and oriented x 3. Normal mood.   EKG: independently reviewed, showing sinus rhythm with rate of 99, QTc 444  Chest x-ray on Admission: I personally reviewed and I agree with radiologist reading as below.  DG Chest 2 View  Result Date: 11/12/2021 CLINICAL DATA:  A 61 year old male presents with history of chest pain. EXAM: CHEST - 2 VIEW COMPARISON:  Comparison is made with May 19, 2021. FINDINGS: Trachea midline. Cardiomediastinal contours and hilar structures are normal. Increased density in the retrocardiac region. No frank lobar consolidation. No visible pneumothorax. No sign of pleural effusion. On limited assessment there is no acute skeletal process. Extensive gaseous distension of the colon with scattered loops of small bowel in the upper abdomen as well. IMPRESSION: Increased density in the retrocardiac region, findings suspicious for developing pneumonia with added density over the spine on the lateral projection. Gaseous distension of both large and small bowel in the abdomen. Correlate with any abdominal symptoms and with evaluation with dedicated abdominal assessment as warranted. Electronically Signed   By: Zetta Bills M.D.   On: 11/12/2021 21:14   CT Chest Wo Contrast  Result Date:  11/12/2021 CLINICAL DATA:  Respiratory distress. EXAM: CT CHEST WITHOUT CONTRAST TECHNIQUE: Multidetector CT imaging of the chest was performed following the standard protocol without IV contrast. RADIATION DOSE REDUCTION: This exam was performed according to the departmental dose-optimization program which includes automated exposure control, adjustment of the mA and/or kV according to patient size and/or use of iterative reconstruction technique. COMPARISON:  Chest radiograph dated 11/12/2021. Chest CT dated 10/18/2020. FINDINGS: Evaluation of this exam is limited in the absence of intravenous contrast. Cardiovascular: There is no cardiomegaly or pericardial effusion. The thoracic aorta and central pulmonary arteries are grossly unremarkable on this noncontrast CT. Mediastinum/Nodes: No hilar or mediastinal adenopathy. The esophagus and the thyroid gland are grossly unremarkable. No mediastinal fluid collection. Lungs/Pleura: Minimal bibasilar dependent subpleural atelectasis. No focal consolidation, pleural effusion, or pneumothorax. There is a 7 mm nodule along the left fissure similar to prior CT, likely a para fissural lymph node or scarring. The central airways  are patent. Upper Abdomen: A 2 mm left renal upper pole calculus. No hydronephrosis. Musculoskeletal: No chest wall mass or suspicious bone lesions identified. IMPRESSION: 1. No acute intrathoracic pathology. 2. A 2 mm left renal upper pole calculus. No hydronephrosis. Electronically Signed   By: Anner Crete M.D.   On: 11/12/2021 22:41   CT Angio Chest PE W and/or Wo Contrast  Result Date: 11/12/2021 CLINICAL DATA:  Chest pain EXAM: CT ANGIOGRAPHY CHEST WITH CONTRAST TECHNIQUE: Multidetector CT imaging of the chest was performed using the standard protocol during bolus administration of intravenous contrast. Multiplanar CT image reconstructions and MIPs were obtained to evaluate the vascular anatomy. RADIATION DOSE REDUCTION: This exam was  performed according to the departmental dose-optimization program which includes automated exposure control, adjustment of the mA and/or kV according to patient size and/or use of iterative reconstruction technique. CONTRAST:  2mL OMNIPAQUE IOHEXOL 350 MG/ML SOLN COMPARISON:  CT chest dated 11/12/2021 at 10:35 p.m. FINDINGS: Cardiovascular: Satisfactory opacification of the bilateral pulmonary arteries to the segmental level. No evidence of pulmonary embolism. Although not tailored for evaluation of the thoracic aorta, there is no evidence of thoracic aortic aneurysm or dissection. Heart is normal in size.  No pericardial effusion. Mediastinum/Nodes: No suspicious mediastinal lymphadenopathy. Visualized thyroid is unremarkable. Lungs/Pleura: Mild dependent atelectasis in the bilateral lower lobes. No suspicious pulmonary nodules. Mild biapical pleural-parenchymal scarring. No pleural effusion or pneumothorax. Upper Abdomen: Visualized upper abdomen is grossly unremarkable. Musculoskeletal: Visualized osseous structures are within normal limits. Review of the MIP images confirms the above findings. IMPRESSION: No evidence of pulmonary embolism. No evidence of acute cardiopulmonary disease. Electronically Signed   By: Julian Hy M.D.   On: 11/12/2021 23:42    Labs on Admission: I have personally reviewed following labs  CBC: Recent Labs  Lab 11/07/21 0931 11/08/21 0327 11/09/21 0405 11/12/21 2037  WBC 7.3 6.7 5.4 6.0  NEUTROABS 4.8  --  2.6  --   HGB 12.5* 11.5* 11.9* 14.0  HCT 37.3* 34.2* 35.1* 41.0  MCV 100.8* 100.9* 100.9* 99.3  PLT 223 204 192 0000000   Basic Metabolic Panel: Recent Labs  Lab 11/07/21 0931 11/08/21 0327 11/09/21 0405 11/12/21 2037  NA 136 136 138 134*  K 3.6 3.6 3.7 3.8  CL 106 106 107 103  CO2 25 26 26 25   GLUCOSE 106* 95 105* 140*  BUN 15 13 9 14   CREATININE 0.74 0.65 0.74 0.78  CALCIUM 9.2 9.0 9.0 9.5   GFR: Estimated Creatinine Clearance: 83.6 mL/min (by  C-G formula based on SCr of 0.78 mg/dL).  Liver Function Tests: Recent Labs  Lab 11/07/21 0931 11/09/21 0405  AST 23 20  ALT 25 20  ALKPHOS 87 76  BILITOT 0.8 0.8  PROT 6.6 5.9*  ALBUMIN 4.0 3.3*   Cardiac Enzymes: Recent Labs  Lab 11/07/21 0931  CKTOTAL 79   Urine analysis:    Component Value Date/Time   COLORURINE YELLOW (A) 05/19/2021 2317   APPEARANCEUR CLEAR (A) 05/19/2021 2317   LABSPEC 1.025 05/19/2021 2317   PHURINE 5.0 05/19/2021 2317   GLUCOSEU NEGATIVE 05/19/2021 2317   HGBUR NEGATIVE 05/19/2021 2317   BILIRUBINUR NEGATIVE 05/19/2021 2317   KETONESUR 80 (A) 05/19/2021 2317   PROTEINUR NEGATIVE 05/19/2021 2317   NITRITE NEGATIVE 05/19/2021 2317   LEUKOCYTESUR NEGATIVE 05/19/2021 2317   Dr. Tobie Poet [facility]  If 7PM-7AM, please contact overnight-coverage provider If 7AM-7PM, please contact day coverage provider [email or web address]  11/13/2021, 12:43 AM

## 2021-11-13 NOTE — TOC CM/SW Note (Signed)
CSW acknowledges that patient came from [facility]. PT eval pending. TOC to follow for DC planning.  Alfonso Ramus, [place] [phone]

## 2021-11-13 NOTE — Progress Notes (Addendum)
Patient c/o pain in his legs, chest and left arm.  He said that he cannot swallow and requested IV pain medication.  DChipper Herb has been notified and will see him this morning.  Resident is upset and uses inappropriate language.     Resident reports that he can swallow soft texture such as apple sauce. This Clinical research associate asked him if he would like oxycodone crushed in apple sauce.  Patient declined and stated that he wants IV pain med.  Patient also stated that he suppose to wear TSLO brace for mobilization, but it was left in snf.  Dr. Chipper Herb made aware.

## 2021-11-14 DIAGNOSIS — G1221 Amyotrophic lateral sclerosis: Secondary | ICD-10-CM

## 2021-11-14 DIAGNOSIS — J9601 Acute respiratory failure with hypoxia: Secondary | ICD-10-CM

## 2021-11-14 DIAGNOSIS — U071 COVID-19: Principal | ICD-10-CM

## 2021-11-14 MED ORDER — DEXAMETHASONE 6 MG PO TABS: 6.0000 mg | ORAL_TABLET | Freq: Every day | ORAL | 0 refills | Status: AC

## 2021-11-14 NOTE — TOC Initial Note (Addendum)
Transition of Care [facility]) - Initial/Assessment Note    Patient Details  Name: Shawn [name] MRN: [id number] Date of Birth: [birth year]  [facility] CM/SW Contact:    Caryn Section, RN Phone Number: 11/14/2021, 1:58 PM  Clinical Narrative:    Patient lives at home alone, sister lives in same apartment complex and assist him with transportation and medications and other care when needed.  [facility] home health can accommodate with PT. OT RN and social work.  Patient is up to date with medication and MD appointments, has DME at home.              Addendum:  Therapy recommends 3 n 1 at home.  [employer] services notified.  Addendum 1409hours:  Patient chooses Home Health rather than SNF.  Explained both and he states he feels better going home with home health at this time.  Addendum:  patient declines 3 n 1 at this time per [employer] at 1536hours.  1536 hours, left message for sister re: discharge.  Expected Discharge Plan: Home w Home Health Services Barriers to Discharge: Continued Medical Work up   Patient Goals and CMS Choice Patient states their goals for this hospitalization and ongoing recovery are:: prefers to go home with home health CMS Medicare.gov Compare Post Acute Care list provided to:: Patient Choice offered to / list presented to : Patient  Expected Discharge Plan and Services Expected Discharge Plan: Home w Home Health Services       Living arrangements for the past 2 months: Skilled Nursing Facility, Single Family Home                                      Prior Living Arrangements/Services Living arrangements for the past 2 months: Skilled Nursing Facility, Single Family Home Lives with:: Self Patient language and need for interpreter reviewed:: Yes Do you feel safe going back to the place where you live?: Yes      Need for Family Participation in Patient Care: Yes (Comment) Care giver support system in place?: Yes (comment)   Criminal  Activity/Legal Involvement Pertinent to Current Situation/Hospitalization: No - Comment as needed  Activities of Daily Living Home Assistive Devices/Equipment: Dan Humphreys (specify type) ADL Screening (condition at time of admission) Patient's cognitive ability adequate to safely complete daily activities?: Yes Is the patient deaf or have difficulty hearing?: No Does the patient have difficulty seeing, even when wearing glasses/contacts?: No Does the patient have difficulty concentrating, remembering, or making decisions?: No Patient able to express need for assistance with ADLs?: Yes Does the patient have difficulty dressing or bathing?: Yes Independently performs ADLs?: No Does the patient have difficulty walking or climbing stairs?: Yes Weakness of Legs: Both Weakness of Arms/Hands: Both  Permission Sought/Granted Permission sought to share information with : Magazine features editor, Family Supports Permission granted to share information with : Yes, Verbal Permission Granted  Share Information with NAME: Ollen Gross or sister  Permission granted to share info w AGENCY: SNFs, HH, DME as needed        Emotional Assessment       Orientation: : Oriented to Self, Oriented to Place, Oriented to  Time, Oriented to Situation Alcohol / Substance Use: Not Applicable Psych Involvement: No (comment)  Admission diagnosis:  Leg pain [M79.606] Acute hypoxemic respiratory failure (HCC) [J96.01] Acute hypoxemic respiratory failure due to COVID-19 (HCC) [U07.1, J96.01] Patient Active Problem List  Diagnosis Date Noted   Right leg pain 11/13/2021   COVID-19 virus infection 11/13/2021   Acute hypoxemic respiratory failure due to COVID-19 [facility]) 11/13/2021   Acute respiratory failure with hypoxia (HCC) 11/12/2021   Compression fracture of L2 (HCC) 11/07/2021   Lumbago 11/07/2021   Pain in both lower legs 11/07/2021   ALS (amyotrophic lateral sclerosis) (HCC) 11/07/2021   Asthma, chronic  11/07/2021   Ureteral stone 05/20/2015   PCP:  [facility] Pharmacy:   [facility] [phone] - Closed - [place], [place] - [address] [place] [address] Phone: [phone] Fax: [phone]  [id number] [place], [place] - [address] [place] [address] Phone: [phone] Fax: [phone]     Social Determinants of Health (SDOH) Interventions    Readmission Risk Interventions No flowsheet data found.

## 2021-11-14 NOTE — Progress Notes (Signed)
IVT consulted for difficult PIV placement.  Upon arrival, pt states he may be dc'd today.  RN states pt is up for possible dc.  Advised to place a new consult if needed and to remove the line that was placed by EMS.

## 2021-11-14 NOTE — Progress Notes (Signed)
IV was flushed prior to medication administration. Patient c/o IV leaking after remdesivir infused and NS was running at [facility]. Patient refused to have IV removed at this time. Requested to speak with leadership to "file a grievance." Charge nurse was made aware and this was escalated to AD.

## 2021-11-14 NOTE — Progress Notes (Signed)
Spoke with patient via phone. IV team consult placed per patient request. Explained the process for obtaining patient records at discharge. Perlie Mayo, RN

## 2021-11-14 NOTE — Evaluation (Signed)
Occupational Therapy Evaluation Patient Details Name: Shawn [name] MRN: [id number] DOB: [birth year] Today's Date: 11/14/2021   History of Present Illness Patient is a 61 yo male that presented to the ED from [facility] for back and chest pain. PMH of ALS, asthma, CAD, HLD, CVA, recent fall with L2 compression fx.   Clinical Impression   Pt was seen for OT evaluation this date. Prior to hospital admission, pt was recently at [facility] for rehab. Pt lives alone and his sister provides groceries, otherwise pt mod indep with ADL, IADL, including driving, uses RW for mobility, and has medications delivered. Currently pt demonstrates impairments as described below (See OT problem list) which functionally limit his ability to perform ADL/self-care tasks. Pt currently requires SBA-CGA for ADL transfers +RW and tolerated standing for pericare/bathing with SBA with HR up to 130 with exertion. Set up and supv for majority of UB and LB bathing. Pt educated in ECS and AE/DME for home ADL/IADL to support safety. Pt would benefit from skilled OT services to address noted impairments and functional limitations (see below for any additional details) in order to maximize safety and independence while minimizing falls risk and caregiver burden. Upon hospital discharge, recommend HHOT to maximize pt safety and return to functional independence during meaningful occupations of daily life. Pt reports no interest in returning to SNF at this time. Also reports already speaking with his sister and coordinating more family support at home temporarily.      Recommendations for follow up therapy are one component of a multi-disciplinary discharge planning process, led by the attending physician.  Recommendations may be updated based on patient status, additional functional criteria and insurance authorization.   Follow Up Recommendations  Home health OT    Assistance Recommended at Discharge Intermittent  Supervision/Assistance  Patient can return home with the following A little help with walking and/or transfers;A little help with bathing/dressing/bathroom;Assistance with cooking/housework;Assist for transportation;Help with stairs or ramp for entrance    Functional Status Assessment  Patient has had a recent decline in their functional status and demonstrates the ability to make significant improvements in function in a reasonable and predictable amount of time.  Equipment Recommendations  BSC/3in1;Tub/shower seat    Recommendations for Other Services       Precautions / Restrictions Precautions Precautions: Fall;Back Required Braces or Orthoses: Spinal Brace Spinal Brace: Lumbar corset Restrictions Weight Bearing Restrictions: No Other Position/Activity Restrictions: LSO with ambulation      Mobility Bed Mobility Overal bed mobility: Needs Assistance Bed Mobility: Supine to Sit, Sit to Sidelying, Rolling Rolling: Supervision   Supine to sit: Supervision, HOB elevated   Sit to sidelying: Min guard General bed mobility comments: CGA for BLE mgt    Transfers Overall transfer level: Needs assistance Equipment used: Rolling walker (2 wheels) Transfers: Sit to/from Stand Sit to Stand: Min guard           General transfer comment: took a couple attempts but ultimately able to stand CGA +RW      Balance Overall balance assessment: Needs assistance Sitting-balance support: Feet supported Sitting balance-Leahy Scale: Good     Standing balance support: Single extremity supported, No upper extremity supported, During functional activity Standing balance-Leahy Scale: Fair                             ADL either performed or assessed with clinical judgement   ADL Overall ADL's : Needs assistance/impaired  General ADL Comments: Set up and supervision for seated bathing, CGA-SBA for pericare in standing  with intermittent UE Support on RW, some bracing of legs against the bed, HR up to 130 with exertion     Vision         Perception     Praxis      Pertinent Vitals/Pain Pain Assessment Pain Assessment: 0-10 Pain Score: 8  Pain Location: low back/groin Pain Descriptors / Indicators: Aching, Sore, Grimacing, Guarding Pain Intervention(s): Limited activity within patient's tolerance, Monitored during session, Premedicated before session, Repositioned, Patient requesting pain meds-RN notified     Hand Dominance     Extremity/Trunk Assessment Upper Extremity Assessment Upper Extremity Assessment: Generalized weakness   Lower Extremity Assessment Lower Extremity Assessment: Generalized weakness   Cervical / Trunk Assessment Cervical / Trunk Assessment: Kyphotic   Communication Communication Communication: Other (comment) (soft spoken)   Cognition Arousal/Alertness: Awake/alert Behavior During Therapy: WFL for tasks assessed/performed Overall Cognitive Status: Within Functional Limits for tasks assessed                                       General Comments       Exercises Other Exercises Other Exercises: Pt educated in energy conservation strategies including home/routines modifications, falls prevention, AE/DME, and activity pacing to maximize safety with ADL/IADL   Shoulder Instructions      Home Living Family/patient expects to be discharged to:: Private residence Living Arrangements: Alone Available Help at Discharge: Other (Comment);Family (pt stated his sister could be checking on him, and interested in having a nurse check in) Type of Home: Apartment Home Access: Ramped entrance     Home Layout: One level     Bathroom Shower/Tub: Chief Strategy Officer: Standard     Home Equipment: Agricultural consultant (2 wheels);Grab bars - tub/shower          Prior Functioning/Environment Prior Level of Function : Independent/Modified  Independent;Driving             Mobility Comments: Mod Ind with amb community distances with a RW, 2 total falls including current fall in the last 6 months both from LOB ADLs Comments: Ind with ADLs, sister drops off groceries, and pt has medications delivered (set up through ALS clinic), stays home most of the time unless he goes to visit his mom ~74min away        OT Problem List: Decreased strength;Pain;Cardiopulmonary status limiting activity;Decreased activity tolerance;Impaired balance (sitting and/or standing);Decreased knowledge of use of DME or AE      OT Treatment/Interventions: Self-care/ADL training;Therapeutic exercise;Therapeutic activities;Cognitive remediation/compensation;Neuromuscular education;Energy conservation;DME and/or AE instruction;Patient/family education;Balance training    OT Goals(Current goals can be found in the care plan section) Acute Rehab OT Goals Patient Stated Goal: go home with home health OT Goal Formulation: With patient Time For Goal Achievement: 11/28/21 Potential to Achieve Goals: Good ADL Goals Pt Will Transfer to Toilet: with modified independence;ambulating (LRAD amb, elevated commode) Pt Will Perform Toileting - Clothing Manipulation and hygiene: with modified independence Additional ADL Goal #1: Pt will verbalize plan to implement at least 2 learned energy conservation strategies to maximize safety/indep with ADL/IADL Additional ADL Goal #2: Pt will perform seated bath with mod indep  OT Frequency: Min 2X/week    Co-evaluation              AM-PAC OT "6 Clicks" Daily Activity  Outcome Measure Help from another person eating meals?: None Help from another person taking care of personal grooming?: None Help from another person toileting, which includes using toliet, bedpan, or urinal?: A Little Help from another person bathing (including washing, rinsing, drying)?: A Little Help from another person to put on and taking off  regular upper body clothing?: None Help from another person to put on and taking off regular lower body clothing?: A Little 6 Click Score: 21   End of Session Equipment Utilized During Treatment: Rolling walker (2 wheels) Nurse Communication: Patient requests pain meds  Activity Tolerance: Patient tolerated treatment well Patient left: in bed;with bed alarm set;with call bell/phone within reach  OT Visit Diagnosis: Other abnormalities of gait and mobility (R26.89);Muscle weakness (generalized) (M62.81);Pain Pain - Right/Left:  (low back)                Time: 0175-1025 OT Time Calculation (min): 53 min Charges:  OT General Charges $OT Visit: 1 Visit OT Evaluation $OT Eval Moderate Complexity: 1 Mod OT Treatments $Self Care/Home Management : 38-52 mins  Arman Filter., MPH, MS, OTR/L ascom [phone] 11/14/21, 9:55 AM

## 2021-11-14 NOTE — Discharge Summary (Signed)
Physician Discharge Summary   Patient: Shawn [name] MRN: [id number] DOB: March 25, 1961  Admit date:     11/12/2021  Discharge date: 11/14/21  Discharge Physician: Marrion Coy   PCP: Inc, [facility]     Discharge Diagnoses: Principal Problem:   Acute respiratory failure with hypoxia [facility]) Active Problems:   Compression fracture of L2 (HCC)   ALS (amyotrophic lateral sclerosis) (HCC)   Asthma, chronic   Right leg pain   COVID-19 virus infection   Acute hypoxemic respiratory failure due to COVID-19 [facility])  Resolved Problems:   * No resolved hospital problems. [facility] Course:  Shawn [name] is a 61 year old male with history of ALS, compression fracture, chronic asthma, presents emergency department for chief concerns of chest pain and back pain.  He has a negative troponin.  CT chest did not show PE, bilateral lower extremity ultrasound showed no DVT.  He was found to be COVID-positive.  He developed hypoxemia with oxygen saturation 87%.  Treated with remdesivir and steroids.  Assessment and Plan: Acute hypoxemic respiratory failure secondary to COVID infection. COVID infection. Condition has improved, off oxygen. At this point, patient is medically stable to be discharged. Patient has been seen by PT/OT, PT recommended nursing home placement, OT recommended home with home care. Patient opted to go home with home care.   ALS Dysphagia from ALS. Follow-up with PCP as outpatient  Chronic pain disorder  Compression fracture of L2. Continue pain medicine.         Consultants: None Procedures performed: None  Disposition: Home Diet recommendation:  Discharge Diet Orders (From admission, onward)     Start     Ordered   11/14/21 0000  Diet - low sodium heart healthy        11/14/21 1409           Cardiac diet  DISCHARGE MEDICATION: Allergies as of 11/14/2021       Reactions   Aspirin Other (See Comments), Itching, Shortness Of  Breath, Swelling   Upset stomach  GI Upset   Compazine [prochlorperazine] Other (See Comments)   Added per pt. Pt had tachycardia chest discomfort   Morphine And Related Itching   Naproxen Other (See Comments)   Upset stomach  GI Upset   Penicillins Rash   Tramadol Itching        Medication List     TAKE these medications    acetaminophen 160 MG/5ML solution Commonly known as: TYLENOL Take 480 mg by mouth every 6 (six) hours as needed for mild pain or moderate pain.   albuterol 108 (90 Base) MCG/ACT inhaler Commonly known as: VENTOLIN HFA Inhale 1-2 puffs into the lungs every 6 (six) hours as needed for wheezing or shortness of breath.   atorvastatin 20 MG tablet Commonly known as: LIPITOR Take 20 mg by mouth daily.   Baclofen 5 MG Tabs Take 10 mg by mouth 3 (three) times daily.   dexamethasone 6 MG tablet Commonly known as: DECADRON Take 1 tablet (6 mg total) by mouth daily for 8 days.   fluticasone furoate-vilanterol 100-25 MCG/ACT Aepb Commonly known as: BREO ELLIPTA Inhale 1 puff into the lungs in the morning.   gabapentin 250 MG/5ML solution Commonly known as: NEURONTIN Take 250 mg by mouth at bedtime.   montelukast 10 MG tablet Commonly known as: SINGULAIR Take 10 mg by mouth at bedtime.   oxyCODONE 5 MG immediate release tablet Commonly known as: Oxy IR/ROXICODONE Take 1 tablet (5 mg total)  by mouth every 4 (four) hours as needed for moderate pain.   riluzole 50 MG tablet Commonly known as: RILUTEK Take 50 mg by mouth 2 (two) times daily.   senna-docusate 8.6-50 MG tablet Commonly known as: Senokot-S Take 1 tablet by mouth at bedtime.   traZODone 100 MG tablet Commonly known as: DESYREL Take 100 mg by mouth at bedtime.        Follow-up [employer]     [facility], [facility] Follow up in 1 week(s).   Contact information: [address] [place] [id number] [phone]                 Discharge Exam: Ceasar MonsFiled Weights    11/12/21 2033  Weight: 61.7 kg   General exam: Appears calm and comfortable  Respiratory system: Clear to auscultation. Respiratory effort normal. Cardiovascular system: S1 & S2 heard, RRR. No JVD, murmurs, rubs, gallops or clicks. No pedal edema. Gastrointestinal system: Abdomen is nondistended, soft and nontender. No organomegaly or masses felt. Normal bowel sounds heard. Central nervous system: Alert and oriented. No focal neurological deficits. Extremities: Symmetric 5 x 5 power. Skin: No rashes, lesions or ulcers Psychiatry: Judgement and insight appear normal. Mood & affect appropriate.    Condition at discharge: good  The results of significant diagnostics from this hospitalization (including imaging, microbiology, ancillary and laboratory) are listed below for reference.   Imaging Studies: DG Chest 2 View  Result Date: 11/12/2021 CLINICAL DATA:  A 61 year old male presents with history of chest pain. EXAM: CHEST - 2 VIEW COMPARISON:  Comparison is made with May 19, 2021. FINDINGS: Trachea midline. Cardiomediastinal contours and hilar structures are normal. Increased density in the retrocardiac region. No frank lobar consolidation. No visible pneumothorax. No sign of pleural effusion. On limited assessment there is no acute skeletal process. Extensive gaseous distension of the colon with scattered loops of small bowel in the upper abdomen as well. IMPRESSION: Increased density in the retrocardiac region, findings suspicious for developing pneumonia with added density over the spine on the lateral projection. Gaseous distension of both large and small bowel in the abdomen. Correlate with any abdominal symptoms and with evaluation with dedicated abdominal assessment as warranted. Electronically Signed   By: Donzetta KohutGeoffrey  Wile M.D.   On: 11/12/2021 21:14   DG Pelvis 1-2 Views  Result Date: 11/07/2021 CLINICAL DATA:  Rule out fracture EXAM: PELVIS - 1-2 VIEW COMPARISON:  None. FINDINGS: There  is no evidence of acute fracture. Mild bilateral hip osteoarthritis. Decreased femoral head neck offset bilaterally consistent with cam deformities. No focal bone lesion. IMPRESSION: No evidence of acute fracture on single frontal view of the pelvis. Electronically Signed   By: Caprice RenshawJacob  Kahn M.D.   On: 11/07/2021 14:27   CT Chest Wo Contrast  Result Date: 11/12/2021 CLINICAL DATA:  Respiratory distress. EXAM: CT CHEST WITHOUT CONTRAST TECHNIQUE: Multidetector CT imaging of the chest was performed following the standard protocol without IV contrast. RADIATION DOSE REDUCTION: This exam was performed according to the departmental dose-optimization program which includes automated exposure control, adjustment of the mA and/or kV according to patient size and/or use of iterative reconstruction technique. COMPARISON:  Chest radiograph dated 11/12/2021. Chest CT dated 10/18/2020. FINDINGS: Evaluation of this exam is limited in the absence of intravenous contrast. Cardiovascular: There is no cardiomegaly or pericardial effusion. The thoracic aorta and central pulmonary arteries are grossly unremarkable on this noncontrast CT. Mediastinum/Nodes: No hilar or mediastinal adenopathy. The esophagus and the thyroid gland are grossly unremarkable. No  mediastinal fluid collection. Lungs/Pleura: Minimal bibasilar dependent subpleural atelectasis. No focal consolidation, pleural effusion, or pneumothorax. There is a 7 mm nodule along the left fissure similar to prior CT, likely a para fissural lymph node or scarring. The central airways are patent. Upper Abdomen: A 2 mm left renal upper pole calculus. No hydronephrosis. Musculoskeletal: No chest wall mass or suspicious bone lesions identified. IMPRESSION: 1. No acute intrathoracic pathology. 2. A 2 mm left renal upper pole calculus. No hydronephrosis. Electronically Signed   By: Elgie CollardArash  Radparvar M.D.   On: 11/12/2021 22:41   CT Angio Chest PE W and/or Wo Contrast  Result Date:  11/12/2021 CLINICAL DATA:  Chest pain EXAM: CT ANGIOGRAPHY CHEST WITH CONTRAST TECHNIQUE: Multidetector CT imaging of the chest was performed using the standard protocol during bolus administration of intravenous contrast. Multiplanar CT image reconstructions and MIPs were obtained to evaluate the vascular anatomy. RADIATION DOSE REDUCTION: This exam was performed according to the departmental dose-optimization program which includes automated exposure control, adjustment of the mA and/or kV according to patient size and/or use of iterative reconstruction technique. CONTRAST:  75mL OMNIPAQUE IOHEXOL 350 MG/ML SOLN COMPARISON:  CT chest dated 11/12/2021 at 10:35 p.m. FINDINGS: Cardiovascular: Satisfactory opacification of the bilateral pulmonary arteries to the segmental level. No evidence of pulmonary embolism. Although not tailored for evaluation of the thoracic aorta, there is no evidence of thoracic aortic aneurysm or dissection. Heart is normal in size.  No pericardial effusion. Mediastinum/Nodes: No suspicious mediastinal lymphadenopathy. Visualized thyroid is unremarkable. Lungs/Pleura: Mild dependent atelectasis in the bilateral lower lobes. No suspicious pulmonary nodules. Mild biapical pleural-parenchymal scarring. No pleural effusion or pneumothorax. Upper Abdomen: Visualized upper abdomen is grossly unremarkable. Musculoskeletal: Visualized osseous structures are within normal limits. Review of the MIP images confirms the above findings. IMPRESSION: No evidence of pulmonary embolism. No evidence of acute cardiopulmonary disease. Electronically Signed   By: Charline BillsSriyesh  Krishnan M.D.   On: 11/12/2021 23:42   CT PELVIS WO CONTRAST  Result Date: 11/07/2021 CLINICAL DATA:  Pelvic pain EXAM: CT PELVIS WITHOUT CONTRAST TECHNIQUE: Multidetector CT imaging of the pelvis was performed following the standard protocol without intravenous contrast. RADIATION DOSE REDUCTION: This exam was performed according to the  departmental dose-optimization program which includes automated exposure control, adjustment of the mA and/or kV according to patient size and/or use of iterative reconstruction technique. COMPARISON:  11/07/2021 FINDINGS: Urinary Tract: Excreted contrast within the urinary bladder from previous CT. Distal ureters are unremarkable. Bowel: No bowel obstruction or ileus. No bowel wall thickening or inflammatory change. Vascular/Lymphatic: No pathologically enlarged lymph nodes. No significant vascular abnormality seen. Reproductive:  Prostate is unremarkable. Other: No free fluid or free gas. Small fat containing right inguinal hernia unchanged. No bowel herniation. Musculoskeletal: There are no acute displaced fractures. Mild symmetrical bilateral hip osteoarthritis. IMPRESSION: 1. No acute displaced fracture. Mild symmetrical bilateral hip osteoarthritis. 2. Small fat containing right inguinal hernia. Electronically Signed   By: Sharlet SalinaMichael  Brown M.D.   On: 11/07/2021 19:19   MR THORACIC SPINE W WO CONTRAST  Result Date: 11/07/2021 CLINICAL DATA:  Fall, back pain EXAM: MRI THORACIC WITHOUT AND WITH CONTRAST TECHNIQUE: Multiplanar and multiecho pulse sequences of the thoracic spine were obtained without and with intravenous contrast. CONTRAST:  6mL GADAVIST GADOBUTROL 1 MMOL/ML IV SOLN COMPARISON:  CT 10/18/2020 FINDINGS: Alignment:  Physiologic. Vertebrae: No fracture, evidence of discitis, or bone lesion. No abnormal enhancement on postcontrast sequences. Cord:  Normal signal and morphology. Paraspinal and other soft tissues: Negative. Disc  levels: Negative. Intervertebral discs of the thoracic spine are preserved without disc height loss or focal disc protrusion. Facet joints within normal limits. No foraminal or canal stenosis at any level within the thoracic spine. IMPRESSION: Unremarkable pre and post-contrast MRI of the thoracic spine. Electronically Signed   By: Duanne Guess D.O.   On: 11/07/2021 12:40    MR Lumbar Spine W Wo Contrast  Result Date: 11/07/2021 CLINICAL DATA:  Low back pain after fall EXAM: MRI LUMBAR SPINE WITHOUT AND WITH CONTRAST TECHNIQUE: Multiplanar and multiecho pulse sequences of the lumbar spine were obtained without and with intravenous contrast. CONTRAST:  60mL GADAVIST GADOBUTROL 1 MMOL/ML IV SOLN COMPARISON:  CT 05/20/2015 FINDINGS: Segmentation:  Standard. Alignment:  Physiologic. Vertebrae: Acute mild superior endplate compression fracture of L2 with approximately 10% vertebral body height loss. No retropulsion. No additional fractures. No evidence of discitis. No suspicious bone lesion. No abnormal enhancement on postcontrast sequences. Conus medullaris and cauda equina: Conus extends to the T12-L1 level. Conus and cauda equina appear normal. Paraspinal and other soft tissues: Negative. Disc levels: T12-L1 through L3-L4: Unremarkable. L4-L5: Mild annular disc bulge and mild bilateral facet hypertrophy. Borderline-mild left foraminal stenosis. No canal stenosis. L5-S1: No disc protrusion. Mild bilateral facet arthropathy. No foraminal or canal stenosis. IMPRESSION: 1. Acute mild superior endplate compression fracture of L2 with approximately 10% vertebral body height loss. No retropulsion. 2. Mild lower lumbar spondylosis with borderline-mild left foraminal stenosis at L4-5. No canal stenosis at any level. Electronically Signed   By: Duanne Guess D.O.   On: 11/07/2021 12:43   US Venous Img Lower Bilateral (DVT)  Result Date: 11/13/2021 CLINICAL DATA:  Bilateral lower extremity pain EXAM: BILATERAL LOWER EXTREMITY VENOUS DOPPLER ULTRASOUND TECHNIQUE: Gray-scale sonography with compression, as well as color and duplex ultrasound, were performed to evaluate the deep venous system(s) from the level of the common femoral vein through the popliteal and proximal calf veins. COMPARISON:  None. FINDINGS: VENOUS Normal compressibility of the common femoral, superficial femoral, and  popliteal veins, as well as the visualized calf veins. Visualized portions of profunda femoral vein and great saphenous vein unremarkable. No filling defects to suggest DVT on grayscale or color Doppler imaging. Doppler waveforms show normal direction of venous flow, normal respiratory plasticity and response to augmentation. OTHER None. Limitations: none IMPRESSION: No lower extremity DVT Electronically Signed   By: Acquanetta Belling M.D.   On: 11/13/2021 08:32    Microbiology: Results for orders placed or performed during the hospital encounter of 11/12/21  Resp Panel by RT-PCR (Flu A&B, Covid) Nasopharyngeal Swab     Status: Abnormal   Collection Time: 11/12/21 11:02 PM   Specimen: Nasopharyngeal Swab; Nasopharyngeal(NP) swabs in vial transport medium  Result Value Ref Range Status   SARS Coronavirus 2 by RT PCR POSITIVE (A) NEGATIVE Final    Comment: (NOTE) SARS-CoV-2 target nucleic acids are DETECTED.  The SARS-CoV-2 RNA is generally detectable in upper respiratory specimens during the acute phase of infection. Positive results are indicative of the presence of the identified virus, but do not rule out bacterial infection or co-infection with other pathogens not detected by the test. Clinical correlation with patient history and other diagnostic information is necessary to determine patient infection status. The expected result is Negative.  Fact Sheet for Patients: BloggerCourse.com  Fact Sheet for Healthcare Providers: SeriousBroker.it  This test is not yet approved or cleared by the [place] FDA and  has been authorized for detection and/or diagnosis of SARS-CoV-2 by  FDA under an Emergency Use Authorization (EUA).  This EUA will remain in effect (meaning this test can be used) for the duration of  the COVID-19 declaration under Section 564(b)(1) of the A ct, 21 U.S.C. section 360bbb-3(b)(1), unless the authorization  is terminated or revoked sooner.     Influenza A by PCR NEGATIVE NEGATIVE Final   Influenza B by PCR NEGATIVE NEGATIVE Final    Comment: (NOTE) The Xpert Xpress SARS-CoV-2/FLU/RSV plus assay is intended as an aid in the diagnosis of influenza from Nasopharyngeal swab specimens and should not be used as a sole basis for treatment. Nasal washings and aspirates are unacceptable for Xpert Xpress SARS-CoV-2/FLU/RSV testing.  Fact Sheet for Patients: BloggerCourse.com  Fact Sheet for Healthcare Providers: SeriousBroker.it  This test is not yet approved or cleared by the [place] FDA and has been authorized for detection and/or diagnosis of SARS-CoV-2 by FDA under an Emergency Use Authorization (EUA). This EUA will remain in effect (meaning this test can be used) for the duration of the COVID-19 declaration under Section 564(b)(1) of the Act, 21 U.S.C. section 360bbb-3(b)(1), unless the authorization is terminated or revoked.  Performed at [facility], [address] [place], [place] [address]   MRSA Next Gen by PCR, Nasal     Status: None   Collection Time: 11/13/21 12:15 PM   Specimen: Nasal Mucosa; Nasal Swab  Result Value Ref Range Status   MRSA by PCR Next Gen NOT DETECTED NOT DETECTED Final    Comment: (NOTE) The GeneXpert MRSA Assay (FDA approved for NASAL specimens only), is one component of a comprehensive MRSA colonization surveillance program. It is not intended to diagnose MRSA infection nor to guide or monitor treatment for MRSA infections. Test performance is not FDA approved in patients less than 17 years old. Performed at [facility], [address] [place], [place] [address]     Labs: CBC: Recent Labs  Lab 11/08/21 0327 11/09/21 0405 11/12/21 2037 11/13/21 0435  WBC 6.7 5.4 6.0 4.8  NEUTROABS  --  2.6  --   --   HGB 11.5* 11.9* 14.0 14.4  HCT 34.2* 35.1* 41.0 43.0  MCV  100.9* 100.9* 99.3 100.7*  PLT 204 192 243 248   Basic Metabolic Panel: Recent Labs  Lab 11/08/21 0327 11/09/21 0405 11/12/21 2037 11/13/21 0435 11/13/21 0800  NA 136 138 134* 139  --   K 3.6 3.7 3.8 5.5* 4.2  CL 106 107 103 103  --   CO2 26 26 25 28   --   GLUCOSE 95 105* 140* 116*  --   BUN 13 9 14 16   --   CREATININE 0.65 0.74 0.78 0.85  --   CALCIUM 9.0 9.0 9.5 10.0  --    Liver Function Tests: Recent Labs  Lab 11/09/21 0405  AST 20  ALT 20  ALKPHOS 76  BILITOT 0.8  PROT 5.9*  ALBUMIN 3.3*   CBG: No results for input(s): GLUCAP in the last 168 hours.  Discharge time spent: greater than 30 minutes.  Signed: , MD [facility] 11/14/2021

## 2021-11-14 NOTE — Progress Notes (Signed)
PROGRESS NOTE    Shawn [name]  [id number] DOB: [birth year] DOA: 11/12/2021 PCP: [employer]    Brief Narrative:   Shawn [name] is a 61 year old male with history of ALS, compression fracture, chronic asthma, presents emergency department for chief concerns of chest pain and back pain.  He has a negative troponin.  CT chest did not show PE, bilateral lower extremity ultrasound showed no DVT.  He was found to be COVID-positive.  He developed hypoxemia with oxygen saturation 87%.  Treated with remdesivir and steroids.   Assessment & Plan:   Principal Problem:   Acute respiratory failure with hypoxia (HCC) Active Problems:   Compression fracture of L2 (HCC)   ALS (amyotrophic lateral sclerosis) (HCC)   Asthma, chronic   Right leg pain   COVID-19 virus infection   Acute hypoxemic respiratory failure due to COVID-19 [facility])   Acute hypoxemic respiratory failure secondary to COVID infection. COVID infection. Condition improving, off oxygen.  Continue steroids and remdesivir.  ALS Dysphagia from ALS. Continue to follow.  Chronic pain disorder  Compression fracture of L2. Continue pain medicine.      DVT prophylaxis: Lovenox Code Status: full Family Communication:  Disposition Plan: Most likely discharge tomorrow.  Patient wished to go home.  Probably will discharge her with home care and PT/OT.     Status is: Severity of the disease, IV treatment.         I/O last 3 completed shifts: In: 200 [P.O.:200] Out: 800 [Urine:800] No intake/output data recorded.     Consultants:  none  Procedures: None  Antimicrobials: None  Subjective: Patient doing well today, he could not tolerate a BiPAP last night.  He does not have secondary short of breath today, no hypoxia. No abdominal pain or nausea vomiting.  Objective: Vitals:   11/13/21 2056 11/14/21 0023 11/14/21 0503 11/14/21 1225  BP: 111/71 105/68 107/66 118/81  Pulse: 66 (!) 57  71 77  Resp: 16 18 17 16   Temp: 98.7 F (37.1 C) 97.7 F (36.5 C) 98.1 F (36.7 C) 98.3 F (36.8 C)  TempSrc:      SpO2: 94% 95% 95% 96%  Weight:      Height:        Intake/Output Summary (Last 24 hours) at 11/14/2021 1329 Last data filed at 11/14/2021 0400 Gross per 24 hour  Intake 200 ml  Output 800 ml  Net -600 ml   Filed Weights   11/12/21 2033  Weight: 61.7 kg    Examination:  General exam: Appears calm and comfortable  Respiratory system: Clear to auscultation. Respiratory effort normal. Cardiovascular system: S1 & S2 heard, RRR. No JVD, murmurs, rubs, gallops or clicks. No pedal edema. Gastrointestinal system: Abdomen is nondistended, soft and nontender. No organomegaly or masses felt. Normal bowel sounds heard. Central nervous system: Alert and oriented. No focal neurological deficits. Extremities: Symmetric 5 x 5 power. Skin: No rashes, lesions or ulcers Psychiatry: Judgement and insight appear normal. Mood & affect appropriate.     Data Reviewed: I have personally reviewed following labs and imaging studies  CBC: Recent Labs  Lab 11/08/21 0327 11/09/21 0405 11/12/21 2037 11/13/21 0435  WBC 6.7 5.4 6.0 4.8  NEUTROABS  --  2.6  --   --   HGB 11.5* 11.9* 14.0 14.4  HCT 34.2* 35.1* 41.0 43.0  MCV 100.9* 100.9* 99.3 100.7*  PLT 204 192 243 [id number]   Basic Metabolic Panel: Recent Labs  Lab 11/08/21 0327 11/09/21 0405 11/12/21 2037  11/13/21 0435 11/13/21 0800  NA 136 138 134* 139  --   K 3.6 3.7 3.8 5.5* 4.2  CL 106 107 103 103  --   CO2 26 26 25 28   --   GLUCOSE 95 105* 140* 116*  --   BUN 13 9 14 16   --   CREATININE 0.65 0.74 0.78 0.85  --   CALCIUM 9.0 9.0 9.5 10.0  --    GFR: Estimated Creatinine Clearance: 78.6 mL/min (by C-G formula based on SCr of 0.85 mg/dL). Liver Function Tests: Recent Labs  Lab 11/09/21 0405  AST 20  ALT 20  ALKPHOS 76  BILITOT 0.8  PROT 5.9*  ALBUMIN 3.3*   No results for input(s): LIPASE, AMYLASE in the  last 168 hours. No results for input(s): AMMONIA in the last 168 hours. Coagulation Profile: No results for input(s): INR, PROTIME in the last 168 hours. Cardiac Enzymes: No results for input(s): CKTOTAL, CKMB, CKMBINDEX, TROPONINI in the last 168 hours. BNP (last 3 results) No results for input(s): PROBNP in the last 8760 hours. HbA1C: No results for input(s): HGBA1C in the last 72 hours. CBG: No results for input(s): GLUCAP in the last 168 hours. Lipid Profile: No results for input(s): CHOL, HDL, LDLCALC, TRIG, CHOLHDL, LDLDIRECT in the last 72 hours. Thyroid Function Tests: Recent Labs    11/12/21 2037  TSH 1.015   Anemia Panel: No results for input(s): VITAMINB12, FOLATE, FERRITIN, TIBC, IRON, RETICCTPCT in the last 72 hours. Sepsis Labs: No results for input(s): PROCALCITON, LATICACIDVEN in the last 168 hours.  Recent Results (from the past 240 hour(s))  Resp Panel by RT-PCR (Flu A&B, Covid) Nasopharyngeal Swab     Status: None   Collection Time: 11/07/21 11:18 AM   Specimen: Nasopharyngeal Swab; Nasopharyngeal(NP) swabs in vial transport medium  Result Value Ref Range Status   SARS Coronavirus 2 by RT PCR NEGATIVE NEGATIVE Final    Comment: (NOTE) SARS-CoV-2 target nucleic acids are NOT DETECTED.  The SARS-CoV-2 RNA is generally detectable in upper respiratory specimens during the acute phase of infection. The lowest concentration of SARS-CoV-2 viral copies this assay can detect is 138 copies/mL. A negative result does not preclude SARS-Cov-2 infection and should not be used as the sole basis for treatment or other patient management decisions. A negative result may occur with  improper specimen collection/handling, submission of specimen other than nasopharyngeal swab, presence of viral mutation(s) within the areas targeted by this assay, and inadequate number of viral copies(<138 copies/mL). A negative result must be combined with clinical observations, patient  history, and epidemiological information. The expected result is Negative.  Fact Sheet for Patients:  EntrepreneurPulse.com.au  Fact Sheet for Healthcare Providers:  IncredibleEmployment.be  This test is no t yet approved or cleared by the [place] FDA and  has been authorized for detection and/or diagnosis of SARS-CoV-2 by FDA under an Emergency Use Authorization (EUA). This EUA will remain  in effect (meaning this test can be used) for the duration of the COVID-19 declaration under Section 564(b)(1) of the Act, 21 U.S.C.section 360bbb-3(b)(1), unless the authorization is terminated  or revoked sooner.       Influenza A by PCR NEGATIVE NEGATIVE Final   Influenza B by PCR NEGATIVE NEGATIVE Final    Comment: (NOTE) The Xpert Xpress SARS-CoV-2/FLU/RSV plus assay is intended as an aid in the diagnosis of influenza from Nasopharyngeal swab specimens and should not be used as a sole basis for treatment. Nasal washings and aspirates are unacceptable  for Xpert Xpress SARS-CoV-2/FLU/RSV testing.  Fact Sheet for Patients: EntrepreneurPulse.com.au  Fact Sheet for Healthcare Providers: IncredibleEmployment.be  This test is not yet approved or cleared by the [place] FDA and has been authorized for detection and/or diagnosis of SARS-CoV-2 by FDA under an Emergency Use Authorization (EUA). This EUA will remain in effect (meaning this test can be used) for the duration of the COVID-19 declaration under Section 564(b)(1) of the Act, 21 U.S.C. section 360bbb-3(b)(1), unless the authorization is terminated or revoked.  Performed at [facility], [address] [place], [place] [address]   Resp Panel by RT-PCR (Flu A&B, Covid) Nasopharyngeal Swab     Status: Abnormal   Collection Time: 11/12/21 11:02 PM   Specimen: Nasopharyngeal Swab; Nasopharyngeal(NP) swabs in vial transport medium  Result  Value Ref Range Status   SARS Coronavirus 2 by RT PCR POSITIVE (A) NEGATIVE Final    Comment: (NOTE) SARS-CoV-2 target nucleic acids are DETECTED.  The SARS-CoV-2 RNA is generally detectable in upper respiratory specimens during the acute phase of infection. Positive results are indicative of the presence of the identified virus, but do not rule out bacterial infection or co-infection with other pathogens not detected by the test. Clinical correlation with patient history and other diagnostic information is necessary to determine patient infection status. The expected result is Negative.  Fact Sheet for Patients: EntrepreneurPulse.com.au  Fact Sheet for Healthcare Providers: IncredibleEmployment.be  This test is not yet approved or cleared by the [place] FDA and  has been authorized for detection and/or diagnosis of SARS-CoV-2 by FDA under an Emergency Use Authorization (EUA).  This EUA will remain in effect (meaning this test can be used) for the duration of  the COVID-19 declaration under Section 564(b)(1) of the A ct, 21 U.S.C. section 360bbb-3(b)(1), unless the authorization is terminated or revoked sooner.     Influenza A by PCR NEGATIVE NEGATIVE Final   Influenza B by PCR NEGATIVE NEGATIVE Final    Comment: (NOTE) The Xpert Xpress SARS-CoV-2/FLU/RSV plus assay is intended as an aid in the diagnosis of influenza from Nasopharyngeal swab specimens and should not be used as a sole basis for treatment. Nasal washings and aspirates are unacceptable for Xpert Xpress SARS-CoV-2/FLU/RSV testing.  Fact Sheet for Patients: EntrepreneurPulse.com.au  Fact Sheet for Healthcare Providers: IncredibleEmployment.be  This test is not yet approved or cleared by the [place] FDA and has been authorized for detection and/or diagnosis of SARS-CoV-2 by FDA under an Emergency Use Authorization (EUA). This EUA  will remain in effect (meaning this test can be used) for the duration of the COVID-19 declaration under Section 564(b)(1) of the Act, 21 U.S.C. section 360bbb-3(b)(1), unless the authorization is terminated or revoked.  Performed at [facility], [address] [place], [place] [address]   MRSA Next Gen by PCR, Nasal     Status: None   Collection Time: 11/13/21 12:15 PM   Specimen: Nasal Mucosa; Nasal Swab  Result Value Ref Range Status   MRSA by PCR Next Gen NOT DETECTED NOT DETECTED Final    Comment: (NOTE) The GeneXpert MRSA Assay (FDA approved for NASAL specimens only), is one component of a comprehensive MRSA colonization surveillance program. It is not intended to diagnose MRSA infection nor to guide or monitor treatment for MRSA infections. Test performance is not FDA approved in patients less than 36 years old. Performed at [facility], [address] [place], [place] [address]  Radiology Studies: DG Chest 2 View  Result Date: 11/12/2021 CLINICAL DATA:  A 61 year old male presents with history of chest pain. EXAM: CHEST - 2 VIEW COMPARISON:  Comparison is made with May 19, 2021. FINDINGS: Trachea midline. Cardiomediastinal contours and hilar structures are normal. Increased density in the retrocardiac region. No frank lobar consolidation. No visible pneumothorax. No sign of pleural effusion. On limited assessment there is no acute skeletal process. Extensive gaseous distension of the colon with scattered loops of small bowel in the upper abdomen as well. IMPRESSION: Increased density in the retrocardiac region, findings suspicious for developing pneumonia with added density over the spine on the lateral projection. Gaseous distension of both large and small bowel in the abdomen. Correlate with any abdominal symptoms and with evaluation with dedicated abdominal assessment as warranted. Electronically Signed   By: Zetta Bills M.D.   On:  11/12/2021 21:14   CT Chest Wo Contrast  Result Date: 11/12/2021 CLINICAL DATA:  Respiratory distress. EXAM: CT CHEST WITHOUT CONTRAST TECHNIQUE: Multidetector CT imaging of the chest was performed following the standard protocol without IV contrast. RADIATION DOSE REDUCTION: This exam was performed according to the departmental dose-optimization program which includes automated exposure control, adjustment of the mA and/or kV according to patient size and/or use of iterative reconstruction technique. COMPARISON:  Chest radiograph dated 11/12/2021. Chest CT dated 10/18/2020. FINDINGS: Evaluation of this exam is limited in the absence of intravenous contrast. Cardiovascular: There is no cardiomegaly or pericardial effusion. The thoracic aorta and central pulmonary arteries are grossly unremarkable on this noncontrast CT. Mediastinum/Nodes: No hilar or mediastinal adenopathy. The esophagus and the thyroid gland are grossly unremarkable. No mediastinal fluid collection. Lungs/Pleura: Minimal bibasilar dependent subpleural atelectasis. No focal consolidation, pleural effusion, or pneumothorax. There is a 7 mm nodule along the left fissure similar to prior CT, likely a para fissural lymph node or scarring. The central airways are patent. Upper Abdomen: A 2 mm left renal upper pole calculus. No hydronephrosis. Musculoskeletal: No chest wall mass or suspicious bone lesions identified. IMPRESSION: 1. No acute intrathoracic pathology. 2. A 2 mm left renal upper pole calculus. No hydronephrosis. Electronically Signed   By: Anner Crete M.D.   On: 11/12/2021 22:41   CT Angio Chest PE W and/or Wo Contrast  Result Date: 11/12/2021 CLINICAL DATA:  Chest pain EXAM: CT ANGIOGRAPHY CHEST WITH CONTRAST TECHNIQUE: Multidetector CT imaging of the chest was performed using the standard protocol during bolus administration of intravenous contrast. Multiplanar CT image reconstructions and MIPs were obtained to evaluate the  vascular anatomy. RADIATION DOSE REDUCTION: This exam was performed according to the departmental dose-optimization program which includes automated exposure control, adjustment of the mA and/or kV according to patient size and/or use of iterative reconstruction technique. CONTRAST:  50mL OMNIPAQUE IOHEXOL 350 MG/ML SOLN COMPARISON:  CT chest dated 11/12/2021 at 10:35 p.m. FINDINGS: Cardiovascular: Satisfactory opacification of the bilateral pulmonary arteries to the segmental level. No evidence of pulmonary embolism. Although not tailored for evaluation of the thoracic aorta, there is no evidence of thoracic aortic aneurysm or dissection. Heart is normal in size.  No pericardial effusion. Mediastinum/Nodes: No suspicious mediastinal lymphadenopathy. Visualized thyroid is unremarkable. Lungs/Pleura: Mild dependent atelectasis in the bilateral lower lobes. No suspicious pulmonary nodules. Mild biapical pleural-parenchymal scarring. No pleural effusion or pneumothorax. Upper Abdomen: Visualized upper abdomen is grossly unremarkable. Musculoskeletal: Visualized osseous structures are within normal limits. Review of the MIP images confirms the above findings. IMPRESSION: No evidence of pulmonary embolism. No evidence of  acute cardiopulmonary disease. Electronically Signed   By: Julian Hy M.D.   On: 11/12/2021 23:42   US Venous Img Lower Bilateral (DVT)  Result Date: 11/13/2021 CLINICAL DATA:  Bilateral lower extremity pain EXAM: BILATERAL LOWER EXTREMITY VENOUS DOPPLER ULTRASOUND TECHNIQUE: Gray-scale sonography with compression, as well as color and duplex ultrasound, were performed to evaluate the deep venous system(s) from the level of the common femoral vein through the popliteal and proximal calf veins. COMPARISON:  None. FINDINGS: VENOUS Normal compressibility of the common femoral, superficial femoral, and popliteal veins, as well as the visualized calf veins. Visualized portions of profunda femoral  vein and great saphenous vein unremarkable. No filling defects to suggest DVT on grayscale or color Doppler imaging. Doppler waveforms show normal direction of venous flow, normal respiratory plasticity and response to augmentation. OTHER None. Limitations: none IMPRESSION: No lower extremity DVT Electronically Signed   By: Miachel Roux M.D.   On: 11/13/2021 08:32        Scheduled Meds:  atorvastatin  20 mg Oral QHS   baclofen  10 mg Oral TID   enoxaparin (LOVENOX) injection  40 mg Subcutaneous Q24H   fluticasone furoate-vilanterol  1 puff Inhalation Daily   gabapentin  250 mg Oral QHS   methylPREDNISolone (SOLU-MEDROL) injection  80 mg Intravenous Q24H   montelukast  10 mg Oral QHS   riluzole  50 mg Oral BID   senna-docusate  1 tablet Oral QHS   traZODone  100 mg Oral QHS   Continuous Infusions:  sodium chloride Stopped (11/13/21 1315)   remdesivir 100 mg in NS 100 mL 100 mg (11/14/21 1048)     LOS: 1 day    Time spent: 32 minutes    Sharen Hones, MD [facility]   To contact the attending provider between 7A-7P or the covering provider during after hours 7P-7A, please log into the web site [email or web address] and access using universal  password for that web site. If you do not have the password, please call the hospital operator.  11/14/2021, 1:29 PM

## 2021-11-14 NOTE — Progress Notes (Signed)
Patient was discharged to home, AVS reviewed, IV removed, telebox returned. Patient confirmed he had all of his belongings.

## 2021-11-14 NOTE — TOC Progression Note (Signed)
Transition of Care [facility]) - Progression Note    Patient Details  Name: TUCKER MINTER MRN: [id number] Date of Birth: 02-26-1961  [facility] CM/SW Contact  Caryn Section, RN Phone Number: 11/14/2021, 3:49 PM  Clinical Narrative:   Spoke with patient's sister and she states that she is able to provide intermittent supervision to patient. As per OT recommendations.  Care team aware of situation.  Patient also has home health.  As per Elnita Maxwell at [facility], they will try to see patient tomorrow.  Sister aware.      Expected Discharge Plan: Home w Home Health Services Barriers to Discharge: Continued Medical Work up  Expected Discharge Plan and Services Expected Discharge Plan: Home w Home Health Services       Living arrangements for the past 2 months: Skilled Nursing Facility, Single Family Home Expected Discharge Date: 11/14/21                                     Social Determinants of Health (SDOH) Interventions    Readmission Risk Interventions No flowsheet data found.

## 2021-11-15 LAB — HIV ANTIBODY (ROUTINE TESTING W REFLEX): HIV Screen 4th Generation wRfx: NONREACTIVE

## 2021-12-26 ENCOUNTER — Other Ambulatory Visit: Payer: Self-pay | Admitting: Physical Medicine and Rehabilitation

## 2021-12-26 DIAGNOSIS — R5381 Other malaise: Secondary | ICD-10-CM

## 2021-12-27 ENCOUNTER — Other Ambulatory Visit: Payer: Self-pay | Admitting: Physical Medicine and Rehabilitation

## 2021-12-27 DIAGNOSIS — S32020A Wedge compression fracture of second lumbar vertebra, initial encounter for closed fracture: Secondary | ICD-10-CM

## 2021-12-29 ENCOUNTER — Ambulatory Visit
Admission: RE | Admit: 2021-12-29 | Discharge: 2021-12-29 | Disposition: A | Discharge status: Home / Self Care | Payer: Medicare Other | Source: Ambulatory Visit | Attending: Physical Medicine and Rehabilitation | Admitting: Physical Medicine and Rehabilitation

## 2021-12-29 ENCOUNTER — Other Ambulatory Visit: Payer: Self-pay | Admitting: Interventional Radiology

## 2021-12-29 ENCOUNTER — Ambulatory Visit
Admission: RE | Admit: 2021-12-29 | Discharge: 2021-12-29 | Disposition: A | Discharge status: Home / Self Care | Payer: Medicare Other | Source: Ambulatory Visit | Attending: Interventional Radiology | Admitting: Interventional Radiology

## 2021-12-29 ENCOUNTER — Other Ambulatory Visit: Payer: Self-pay | Admitting: Physical Medicine and Rehabilitation

## 2021-12-29 DIAGNOSIS — S32020A Wedge compression fracture of second lumbar vertebra, initial encounter for closed fracture: Secondary | ICD-10-CM

## 2021-12-29 IMAGING — DX DG LUMBAR SPINE 2-3V
2 series · 2 of 2 positions shown · non-contrast
Comparison: L-spine XR report, [DATE].  MR ROSANIO, [DATE].

CLINICAL DATA: Compression fracture of L2 vertebra, MRI on [DATE].
IR Eval/consultation today in the office.

EXAM:
LUMBAR SPINE - 2-3 VIEW

[view not recorded]
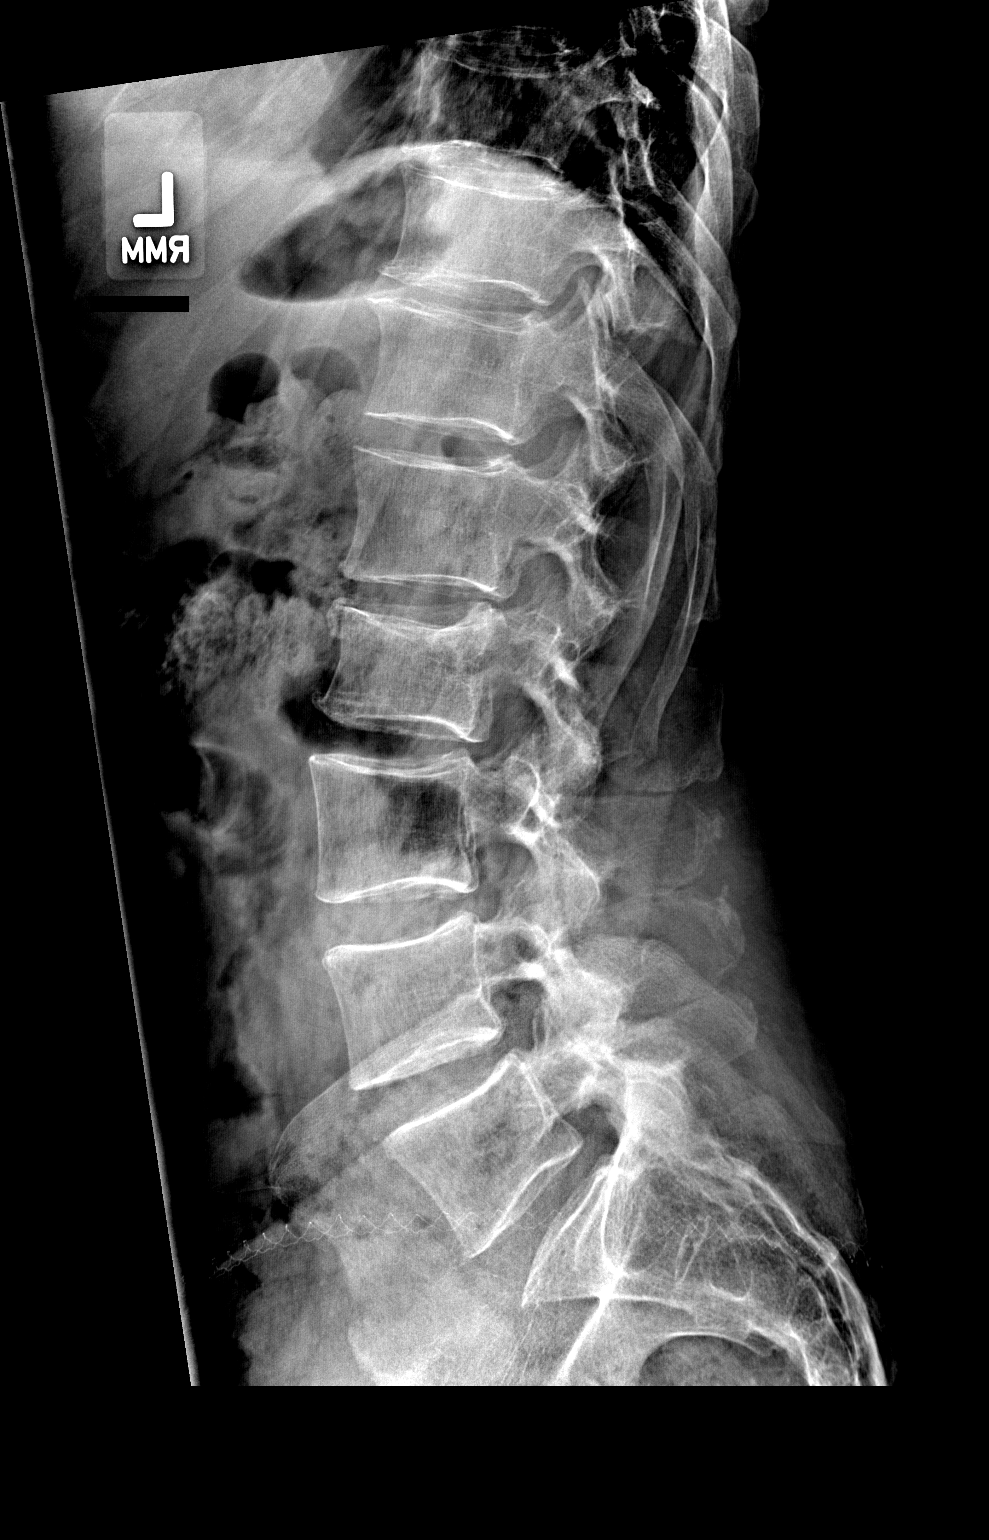

[AP]
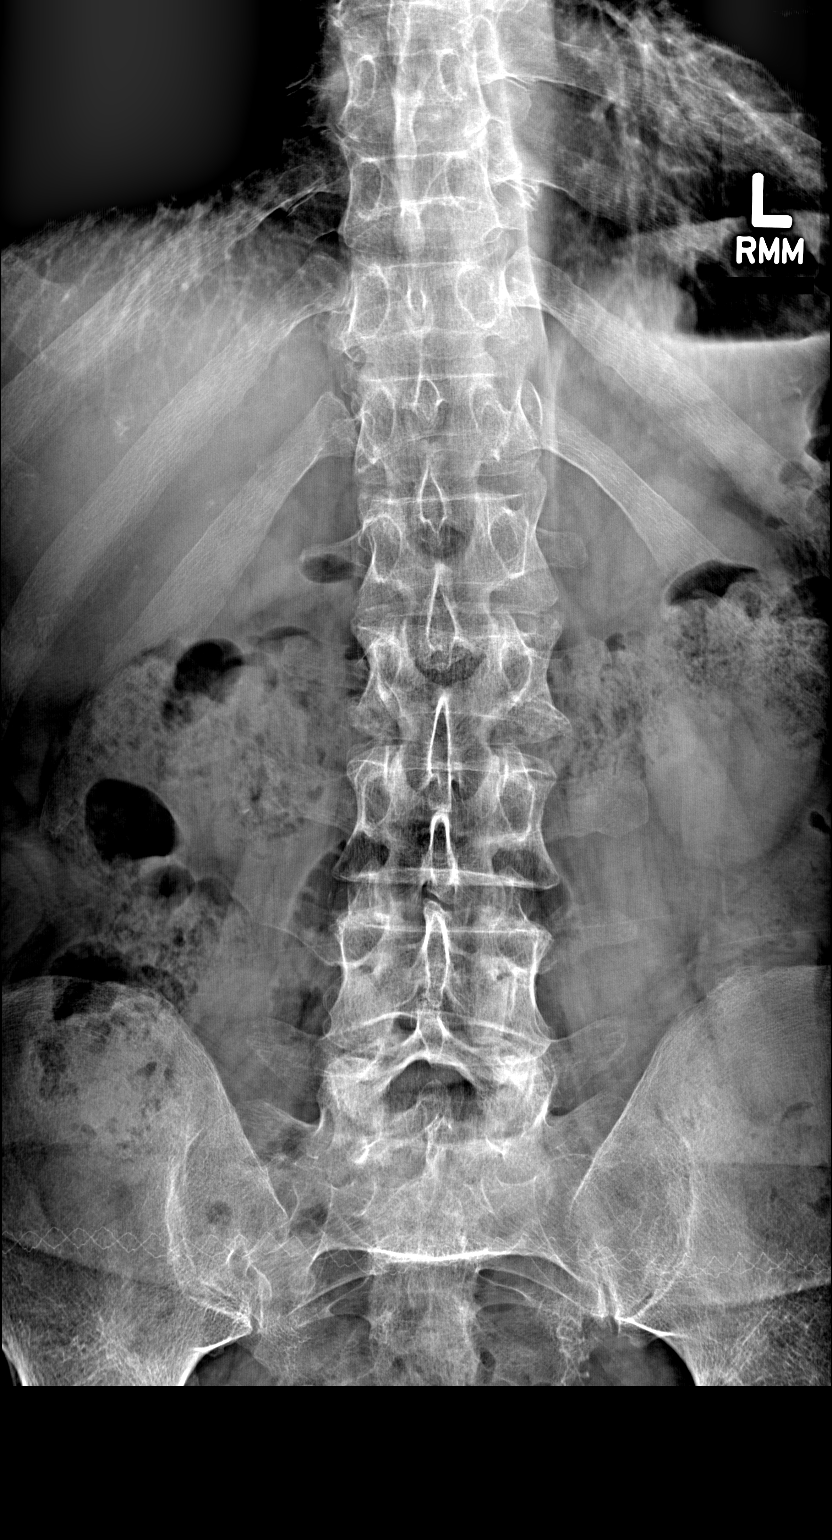

[2 of 2 positions shown; findings below may reference images not displayed]

FINDINGS: Five non rib-bearing lumbar-type vertebral bodies are present.
Intact vertebral body alignment. Intervertebral disc spaces are
preserved.

L2 superior endplate compression deformity with mild step-off at the
anterior cortex and approximately 25% loss of height.

Additional lumbar and imaged thoracic vertebral bodies appear
intact.
IMPRESSION: L2 superior endplate compression deformity with approximately 25%
loss of height.

No traumatic malalignment.

## 2021-12-29 NOTE — H&P (Signed)
? ? ?Chief Complaint: ?Patient was seen in consultation today for L2 fracture and low back pain at the request of Chasnis,Benjamin ? ?Referring Physician(s): ?Chasnis,Benjamin ? ?History of Present Illness: ? ?Shawn [name] is a 61 y.o. male w PMHx significant for ALS, chronic asthma and CAD who is seen in clinic for severe, persistent low back pain. Pt reports that though his mobility is limited he lives alone and uses a walker. He lost his footing and fell on 11/06/21 after which he presented to [facility] ER secondary to pain and was admitted. Evaluations included an MR L spine which demonstrated an acute L2 fracture with ~10% LoH. He was discharged with an LSO brace to a facility but was readmitted 3 days after for increased back and chest pain. He has since gone back Home and is joined today by his Mother, Dorene Sorrow.  ? ?He rates his pain as 10/10, and that it is present most of the time. He takes PO pain pills to help and does not believe the brace is helping much. He was seen in follow up by Orthopedics Spine at [facility] where a follow up lumbar XR showed increasing height loss to ~25% LoH. Due to his persistent pain he was referred to Vascular and Interventional Radiology to evaluate for potential minimally invasive treatment with kyphoplasty. ? ?Review of Systems: A 12 point ROS discussed and pertinent positives are indicated in the HPI above.  All other systems are negative. ? ?Past Medical History:  ?Diagnosis Date  ? ALS (amyotrophic lateral sclerosis) ([place])   ? Asthma   ? Coronary artery disease   ? Hyperlipidemia   ? Renal stone   ? Stroke [facility])   ? ? ?Past Surgical History:  ?Procedure Laterality Date  ? CARDIAC CATHETERIZATION  2006  ? negative  ? CYSTOSCOPY W/ URETERAL STENT REMOVAL Left 06/11/2015  ? Procedure: CYSTOSCOPY WITH STENT EXCHANGE;  Surgeon: Cleon Gustin, MD;  Location: [facility] ORS;  Service: Urology;  Laterality: Left;  ? CYSTOSCOPY WITH STENT PLACEMENT Left 05/20/2015  ?  Procedure: CYSTOSCOPY WITH STENT PLACEMENT;  Surgeon: Ardis Hughs, MD;  Location: [facility] ORS;  Service: Urology;  Laterality: Left;  ? CYSTOSCOPY/RETROGRADE/URETEROSCOPY/STONE EXTRACTION WITH BASKET  2000  ? pt. unsure of which side done  ? CYSTOSCOPY/URETEROSCOPY/HOLMIUM LASER Left 06/11/2015  ? Procedure: CYSTOSCOPY/URETEROSCOPY/HOLMIUM LASER;  Surgeon: Cleon Gustin, MD;  Location: [facility] ORS;  Service: Urology;  Laterality: Left;  ? KNEE SURGERY Right 84,96, 2003  ? x3  ? SHOULDER SURGERY Left 2010  ? ? ?Allergies: ?Aspirin, Compazine [prochlorperazine], Morphine and related, Naproxen, Penicillins, and Tramadol ? ?Medications: ?Prior to Admission medications   ?Medication Sig Start Date End Date Taking? Authorizing Provider  ?acetaminophen (TYLENOL) 160 MG/5ML solution Take 480 mg by mouth every 6 (six) hours as needed for mild pain or moderate pain.    [provider]  ?albuterol (VENTOLIN HFA) 108 (90 Base) MCG/ACT inhaler Inhale 1-2 puffs into the lungs every 6 (six) hours as needed for wheezing or shortness of breath.    [provider]  ?atorvastatin (LIPITOR) 20 MG tablet Take 20 mg by mouth daily. 06/25/20   [provider]  ?Baclofen 5 MG TABS Take 10 mg by mouth 3 (three) times daily.    [provider]  ?fluticasone furoate-vilanterol (BREO ELLIPTA) 100-25 MCG/ACT AEPB Inhale 1 puff into the lungs in the morning.    [provider]  ?gabapentin (NEURONTIN) 250 MG/5ML solution Take 250 mg by mouth at bedtime.  [provider]  ?montelukast (SINGULAIR) 10 MG tablet Take 10 mg by mouth at bedtime.    [provider]  ?oxyCODONE (OXY IR/ROXICODONE) 5 MG immediate release tablet Take 1 tablet (5 mg total) by mouth every 4 (four) hours as needed for moderate pain. 11/09/21   Aline August, MD  ?riluzole (RILUTEK) 50 MG tablet Take 50 mg by mouth 2 (two) times daily.    [provider]  ?senna-docusate (SENOKOT-S) 8.6-50 MG tablet  Take 1 tablet by mouth at bedtime. 11/09/21   Aline August, MD  ?traZODone (DESYREL) 100 MG tablet Take 100 mg by mouth at bedtime.    [provider]  ?  ? ?Family History  ?Problem Relation Age of Onset  ? Cancer Mother   ? Heart failure Mother   ? Hypertension Mother   ? CVA Mother   ? Diabetes Other   ? Other Father   ?     unknown medical history  ? ? ?Social History  ? ?Socioeconomic History  ? Marital status: Single  ?  Spouse name: Not on file  ? Number of children: Not on file  ? Years of education: Not on file  ? Highest education level: Not on file  ?Occupational History  ? Not on file  ?Tobacco Use  ? Smoking status: Never  ? Smokeless tobacco: Never  ?Vaping Use  ? Vaping Use: Never used  ?Substance and Sexual Activity  ? Alcohol use: Not Currently  ?  Alcohol/week: 0.0 standard drinks  ?  Comment: occasionally  ? Drug use: Never  ? Sexual activity: Not on file  ?Other Topics Concern  ? Not on file  ?Social History Narrative  ? Not on file  ? ?Social Determinants of Health  ? ?Financial Resource Strain: Not on file  ?Food Insecurity: Not on file  ?Transportation Needs: Not on file  ?Physical Activity: Not on file  ?Stress: Not on file  ?Social Connections: Not on file  ? ? ?Review of Systems ?As above ? ?Vital Signs: ?BP 133/75 (BP Location: Right Arm, Patient Position: Sitting)   Pulse 76   Temp 97.9 ?F (36.6 ?C) (Oral)   SpO2 96%  ? ?Physical Exam ? ?General: WN, NAD  ?CV: RRR on monitor ?Pulm: normal work of breathing on RA, weak cough, mild laboring of speech ?Abd: S, ND, NT ?MSK: weak muscles of BUE / BLE, on a wheelchair. LSO brace.  ?*Point tenderness at mid to lower back* ?Psych: Appropriate affect. ?  ? ?Imaging: ?MR L spine, 11/07/21 ?Independently reviewed demonstrating 5 non-rib bearing, lumbar-type vertebral bodies. ?Hyperintense STIR signal at L2. Superior endplate vertebral body fx. No canal infiltration. ? ? ? ? ?Labs: ? ?CBC: ?Recent Labs  ?  11/08/21 ?0327 11/09/21 ?0405  11/12/21 ?2037 11/13/21 ?0435  ?WBC 6.7 5.4 6.0 4.8  ?HGB 11.5* 11.9* 14.0 14.4  ?HCT 34.2* 35.1* 41.0 43.0  ?PLT 204 192 243 248  ? ? ?COAGS: ?No results for input(s): INR, APTT in the last 8760 hours. ? ?BMP: ?Recent Labs  ?  11/08/21 ?0327 11/09/21 ?0405 11/12/21 ?2037 11/13/21 ?0435 11/13/21 ?0800  ?NA 136 138 134* 139  --   ?K 3.6 3.7 3.8 5.5* 4.2  ?CL 106 107 103 103  --   ?CO2 _0 --   ?GLUCOSE 95 105* 140* 116*  --   ?BUN _1 --   ?CALCIUM 9.0 9.0 9.5 10.0  --   ?CREATININE 0.65 0.74 0.78  0.85  --   ?GFRNONAA >60 >60 >60 >60  --   ? ? ?Assessment and Plan: ? ?61 y/o M comorbid w PMHx significant for ALS and chronic asthma who presents with symptomatic L2 vertebral compression fracture ? ?The Pt is interested in pursuing a minimally-invasive option for therapy of symptomatic spinal fracture at this time with kyphoplasty. ?  ?Risks were discussed including, but not limited to, bleeding, infection, cement migration which may cause spinal cord damage, paralysis, pulmonary embolism or even death. ?  ?The procedure has been fully reviewed with the patient/patient?s authorized representative. The risks, benefits and alternatives have been explained, and the patient/patient?s authorized representative has consented to the procedure. ?  ?*MR L-spine and L-spine XRs reviewed. No additional imaging required. ?*Proceed to schedule based on mutual availability. ?*Procedure to be [facility] at [facility] ?*Request Anesthesia Team support secondary to his ALS, diaphragmatic weakening and prone positioning. MAC sedation preferred, if possible, to avoid a failure to extubate. ?*Will Statistician presence ([employer] KP) ?*Same day procedure, no overnight admission. ?*Ancef for pre op Abx. ? ? ?Thank you for this interesting consult.  I greatly enjoyed meeting KIREN MCISAAC and look forward to participating in their care.  A copy of this report was sent to the requesting  provider on this date. ? ?Electronically Signed: ? ?Michaelle Birks, MD ?[facility] ?[facility] [place] [facility] ? ? ?Pager. [phone] ?Clinic. [phone] ? ?I spent a total of 40

## 2021-12-30 ENCOUNTER — Other Ambulatory Visit: Payer: Self-pay | Admitting: Interventional Radiology

## 2021-12-30 DIAGNOSIS — S32020A Wedge compression fracture of second lumbar vertebra, initial encounter for closed fracture: Secondary | ICD-10-CM

## 2022-01-02 ENCOUNTER — Other Ambulatory Visit: Payer: Self-pay | Admitting: Interventional Radiology

## 2022-01-02 DIAGNOSIS — M8088XA Other osteoporosis with current pathological fracture, vertebra(e), initial encounter for fracture: Secondary | ICD-10-CM

## 2022-01-02 DIAGNOSIS — S32028A Other fracture of second lumbar vertebra, initial encounter for closed fracture: Secondary | ICD-10-CM

## 2022-01-02 DIAGNOSIS — S32020A Wedge compression fracture of second lumbar vertebra, initial encounter for closed fracture: Secondary | ICD-10-CM

## 2022-01-02 NOTE — Progress Notes (Signed)
Patient on schedule for L2 kyphoplasty, called and spoke with patient on phone with pre procedure instructions given. Made aware to be here @ 1100, NPO after MN prior to procedure as well as driver post procedure/recovery/discharge, stated understanding. ?

## 2022-01-04 ENCOUNTER — Other Ambulatory Visit: Payer: Self-pay | Admitting: Student

## 2022-01-05 NOTE — H&P (Signed)
? ?Chief Complaint: Patient was seen in consultation today for L2 compression fracture/back pain ? ?Referring Physician(s): Chasnis, Sharlet Salina ? ?Supervising Physician: Roanna Banning ? ?Patient Status: [facility] - Out-pt ? ?History of Present Illness: ?Shawn [name] is a 61 y.o. male with a past medical history significant for ALS, CAD, HLD, CVA who presents today for L2 kyphoplasty/vertebroplasty. Shawn [name] was seen in consultation by Dr. Milford Cage on 12/29/21 - please see this consult note for full details. Briefly, Shawn [name] had a fall on 11/06/30 and presented to the ED due to persistent low back pain. He was found to have an acute L2 compression fracture ~10% height loss. He was discharged with an LSO brace however he returned to the ED several days later due to persistent pain. He was evaluated by orthopedics and x-ray showed an increasing height loss to ~25%. He continues to have ineffective pain relief with bracing and oral pain medications so he was referred to IR for possible KP/VP. After evaluation by Dr. Milford Cage he was deemed a candidate for L2 KP/VP with anesthesia present secondary to ALS, diaphragmatic weakening and prone positioning required for the procedure. ? ?Patient seen in pre-op with his mother, he reports 9/10 back pain currently and his not had good relief with pain medications at home. He has done a lot of research on the procedure and is ready to proceed. ? ?Past Medical History:  ?Diagnosis Date  ? ALS (amyotrophic lateral sclerosis) ([facility])   ? Asthma   ? Coronary artery disease   ? Hyperlipidemia   ? Renal stone   ? Stroke [facility])   ? ? ?Past Surgical History:  ?Procedure Laterality Date  ? CARDIAC CATHETERIZATION  2006  ? negative  ? CYSTOSCOPY W/ URETERAL STENT REMOVAL Left 06/11/2015  ? Procedure: CYSTOSCOPY WITH STENT EXCHANGE;  Surgeon: Malen Gauze, MD;  Location: [facility] ORS;  Service: Urology;  Laterality: Left;  ? CYSTOSCOPY WITH STENT PLACEMENT Left 05/20/2015  ? Procedure:  CYSTOSCOPY WITH STENT PLACEMENT;  Surgeon: Crist Fat, MD;  Location: [facility] ORS;  Service: Urology;  Laterality: Left;  ? CYSTOSCOPY/RETROGRADE/URETEROSCOPY/STONE EXTRACTION WITH BASKET  2000  ? pt. unsure of which side done  ? CYSTOSCOPY/URETEROSCOPY/HOLMIUM LASER Left 06/11/2015  ? Procedure: CYSTOSCOPY/URETEROSCOPY/HOLMIUM LASER;  Surgeon: Malen Gauze, MD;  Location: [facility] ORS;  Service: Urology;  Laterality: Left;  ? KNEE SURGERY Right 84,96, 2003  ? x3  ? SHOULDER SURGERY Left 2010  ? ? ?Allergies: ?Aspirin, Compazine [prochlorperazine], Morphine and related, Naproxen, Penicillins, and Tramadol ? ?Medications: ?Prior to Admission medications   ?Medication Sig Start Date End Date Taking? Authorizing Provider  ?acetaminophen (TYLENOL) 160 MG/5ML solution Take 480 mg by mouth every 6 (six) hours as needed for mild pain or moderate pain.    [provider]  ?albuterol (VENTOLIN HFA) 108 (90 Base) MCG/ACT inhaler Inhale 1-2 puffs into the lungs every 6 (six) hours as needed for wheezing or shortness of breath.    [provider]  ?atorvastatin (LIPITOR) 20 MG tablet Take 20 mg by mouth daily. 06/25/20   [provider]  ?Baclofen 5 MG TABS Take 10 mg by mouth 3 (three) times daily.    [provider]  ?fluticasone furoate-vilanterol (BREO ELLIPTA) 100-25 MCG/ACT AEPB Inhale 1 puff into the lungs in the morning.    [provider]  ?gabapentin (NEURONTIN) 250 MG/5ML solution Take 250 mg by mouth at bedtime.    [provider]  ?montelukast (SINGULAIR) 10 MG tablet Take 10 mg by  mouth at bedtime.    [provider]  ?oxyCODONE (OXY IR/ROXICODONE) 5 MG immediate release tablet Take 1 tablet (5 mg total) by mouth every 4 (four) hours as needed for moderate pain. 11/09/21   Glade LloydAlekh, Kshitiz, MD  ?riluzole (RILUTEK) 50 MG tablet Take 50 mg by mouth 2 (two) times daily.    [provider]  ?senna-docusate (SENOKOT-S) 8.6-50 MG tablet Take 1  tablet by mouth at bedtime. 11/09/21   Glade LloydAlekh, Kshitiz, MD  ?traZODone (DESYREL) 100 MG tablet Take 100 mg by mouth at bedtime.    [provider]  ?  ? ?Family History  ?Problem Relation Age of Onset  ? Cancer Mother   ? Heart failure Mother   ? Hypertension Mother   ? CVA Mother   ? Diabetes Other   ? Other Father   ?     unknown medical history  ? ? ?Social History  ? ?Socioeconomic History  ? Marital status: Single  ?  Spouse name: Not on file  ? Number of children: Not on file  ? Years of education: Not on file  ? Highest education level: Not on file  ?Occupational History  ? Not on file  ?Tobacco Use  ? Smoking status: Never  ? Smokeless tobacco: Never  ?Vaping Use  ? Vaping Use: Never used  ?Substance and Sexual Activity  ? Alcohol use: Not Currently  ?  Alcohol/week: 0.0 standard drinks  ?  Comment: occasionally  ? Drug use: Never  ? Sexual activity: Not on file  ?Other Topics Concern  ? Not on file  ?Social History Narrative  ? Not on file  ? ?Social Determinants of Health  ? ?Financial Resource Strain: Not on file  ?Food Insecurity: Not on file  ?Transportation Needs: Not on file  ?Physical Activity: Not on file  ?Stress: Not on file  ?Social Connections: Not on file  ? ? ? ?Review of Systems: A 12 point ROS discussed and pertinent positives are indicated in the HPI above.  All other systems are negative. ? ?Review of Systems  ?Constitutional:  Negative for chills and fever.  ?Respiratory:  Positive for shortness of breath (Baseline 2/2 ALS). Negative for cough.   ?Cardiovascular:  Negative for chest pain.  ?Gastrointestinal:  Negative for abdominal pain, nausea and vomiting.  ?Musculoskeletal:  Positive for back pain and gait problem.  ?Neurological:  Negative for dizziness and headaches.  ? ?Vital Signs: ?BP 106/72   Pulse 65   Temp 97.7 ?F (36.5 ?C) (Oral)   Resp 18   Ht 5\' 10"  (1.778 m)   Wt 136 lb (61.7 kg)   SpO2 96%   BMI 19.51 kg/m?  ? ?Physical Exam ?Vitals reviewed.  ?Constitutional:    ?   General: He is not in acute distress. ?HENT:  ?   Head: Normocephalic.  ?   Mouth/Throat:  ?   Mouth: Mucous membranes are moist.  ?   Pharynx: Oropharynx is clear. No oropharyngeal exudate or posterior oropharyngeal erythema.  ?Cardiovascular:  ?   Rate and Rhythm: Normal rate and regular rhythm.  ?Pulmonary:  ?   Effort: Pulmonary effort is normal.  ?   Breath sounds: Normal breath sounds.  ?Abdominal:  ?   General: There is no distension.  ?   Palpations: Abdomen is soft.  ?   Tenderness: There is no guarding.  ?Skin: ?   General: Skin is warm and dry.  ?Neurological:  ?   Mental Status: He is alert  and oriented to person, place, and time.  ?Psychiatric:     ?   Mood and Affect: Mood normal.     ?   Behavior: Behavior normal.     ?   Thought Content: Thought content normal.     ?   Judgment: Judgment normal.  ? ? ? ?MD Evaluation ?Airway: WNL ?Heart: WNL ?Abdomen: WNL ?Chest/ Lungs: WNL ?ASA  Classification: Per MD or Designee ? ? ?Imaging: ?DG Lumbar Spine 2-3 Views ? ?Result Date: 12/29/2021 ?CLINICAL DATA:  Compression fracture of L2 vertebra, MRI on 11/07/21. IR Eval/consultation today in the office. EXAM: LUMBAR SPINE - 2-3 VIEW COMPARISON:  L-spine XR report, 12/06/2021.  MR L-spine, 11/07/2021. FINDINGS: Five non rib-bearing lumbar-type vertebral bodies are present. Intact vertebral body alignment. Intervertebral disc spaces are preserved. L2 superior endplate compression deformity with mild step-off at the anterior cortex and approximately 25% loss of height. Additional lumbar and imaged thoracic vertebral bodies appear intact. IMPRESSION: L2 superior endplate compression deformity with approximately 25% loss of height. No traumatic malalignment. Electronically Signed   By: Roanna Banning M.D.   On: 12/29/2021 15:58   ? ?Labs: ? ?CBC: ?Recent Labs  ?  11/08/21 ?0327 11/09/21 ?0405 11/12/21 ?2037 11/13/21 ?0435  ?WBC 6.7 5.4 6.0 4.8  ?HGB 11.5* 11.9* 14.0 14.4  ?HCT 34.2* 35.1* 41.0 43.0  ?PLT 204 192 243  248  ? ? ?COAGS: ?No results for input(s): INR, APTT in the last 8760 hours. ? ?BMP: ?Recent Labs  ?  11/08/21 ?0327 11/09/21 ?0405 11/12/21 ?2037 11/13/21 ?0435 11/13/21 ?0800  ?NA 136 138 134* 139  --   ?K 3.6 3.7

## 2022-01-06 ENCOUNTER — Encounter: Payer: Self-pay | Admitting: Anesthesiology

## 2022-01-06 ENCOUNTER — Ambulatory Visit
Admission: RE | Admit: 2022-01-06 | Discharge: 2022-01-06 | Disposition: A | Discharge status: Home / Self Care | Payer: Medicare Other | Source: Ambulatory Visit | Attending: Interventional Radiology | Admitting: Interventional Radiology

## 2022-01-06 ENCOUNTER — Encounter: Payer: Self-pay | Admitting: Radiology

## 2022-01-06 ENCOUNTER — Other Ambulatory Visit: Payer: Self-pay | Admitting: Interventional Radiology

## 2022-01-06 ENCOUNTER — Other Ambulatory Visit: Payer: Self-pay

## 2022-01-06 DIAGNOSIS — S32020A Wedge compression fracture of second lumbar vertebra, initial encounter for closed fracture: Secondary | ICD-10-CM

## 2022-01-06 DIAGNOSIS — E785 Hyperlipidemia, unspecified: Secondary | ICD-10-CM | POA: Diagnosis not present

## 2022-01-06 DIAGNOSIS — I2510 Atherosclerotic heart disease of native coronary artery without angina pectoris: Secondary | ICD-10-CM | POA: Diagnosis not present

## 2022-01-06 DIAGNOSIS — M8088XA Other osteoporosis with current pathological fracture, vertebra(e), initial encounter for fracture: Secondary | ICD-10-CM | POA: Insufficient documentation

## 2022-01-06 DIAGNOSIS — Z79899 Other long term (current) drug therapy: Secondary | ICD-10-CM | POA: Insufficient documentation

## 2022-01-06 DIAGNOSIS — G1221 Amyotrophic lateral sclerosis: Secondary | ICD-10-CM | POA: Diagnosis not present

## 2022-01-06 DIAGNOSIS — W19XXXA Unspecified fall, initial encounter: Secondary | ICD-10-CM | POA: Insufficient documentation

## 2022-01-06 DIAGNOSIS — Z8673 Personal history of transient ischemic attack (TIA), and cerebral infarction without residual deficits: Secondary | ICD-10-CM | POA: Diagnosis not present

## 2022-01-06 HISTORY — PX: IR KYPHO LUMBAR INC FX REDUCE BONE BX UNI/BIL CANNULATION INC/IMAGING: IMG5519

## 2022-01-06 LAB — CBC
HCT: 40.6 % (ref 39.0–52.0)
Hemoglobin: 13.5 g/dL (ref 13.0–17.0)
MCH: 32.9 pg (ref 26.0–34.0)
MCHC: 33.3 g/dL (ref 30.0–36.0)
MCV: 99 fL (ref 80.0–100.0)
Platelets: 207 10*3/uL (ref 150–400)
RBC: 4.1 MIL/uL — ABNORMAL LOW (ref 4.22–5.81)
RDW: 12.6 % (ref 11.5–15.5)
WBC: 5.2 10*3/uL (ref 4.0–10.5)
nRBC: 0 % (ref 0.0–0.2)

## 2022-01-06 LAB — PROTIME-INR
INR: 1 (ref 0.8–1.2)
Prothrombin Time: 12.9 seconds (ref 11.4–15.2)

## 2022-01-06 IMAGING — XA IR KYPHO VERTEBRAL LUMBAR AUGMENTATION
1 series · 13 of 24 positions shown · non-contrast
Comparison: Lumbar XRs, [DATE].

CLINICAL DATA: Briefly, 60-year-old male with ALS and symptomatic
L2 vertebral body fracture s/p fall.

EXAM:
VERTEBRAL AUGMENTATION WITH KYPHOPLASTY OF L2 VERTEBRAL BODY

[Series 2: vasc extremity · 13 of 28 slices shown]
[im 1/28]
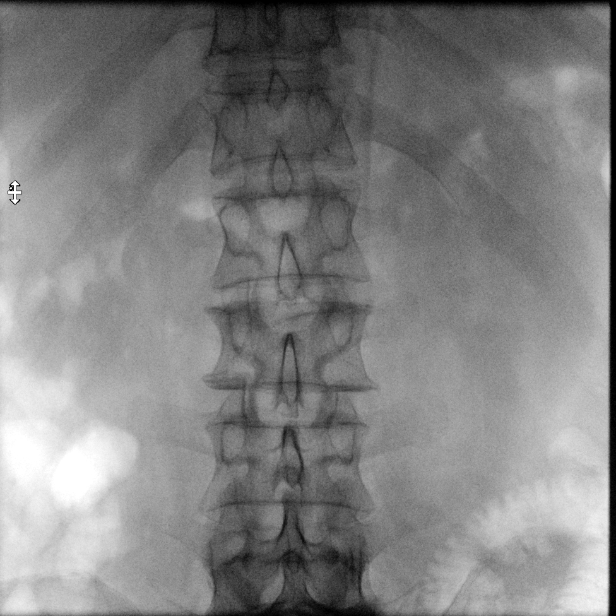
[im 3/28]
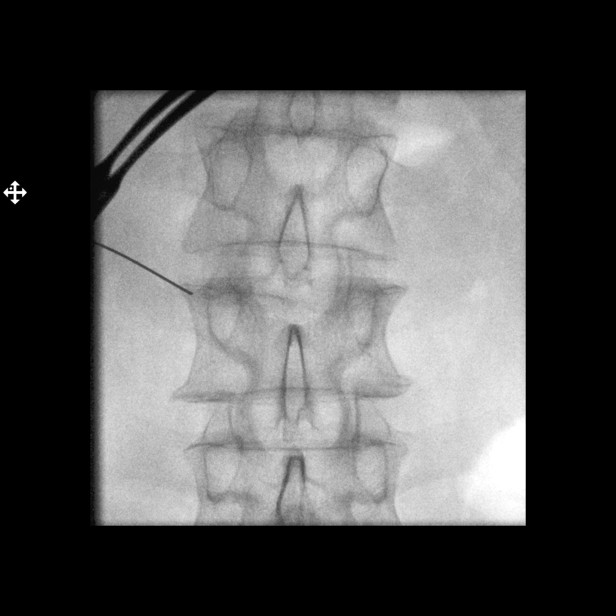
[im 5/28]
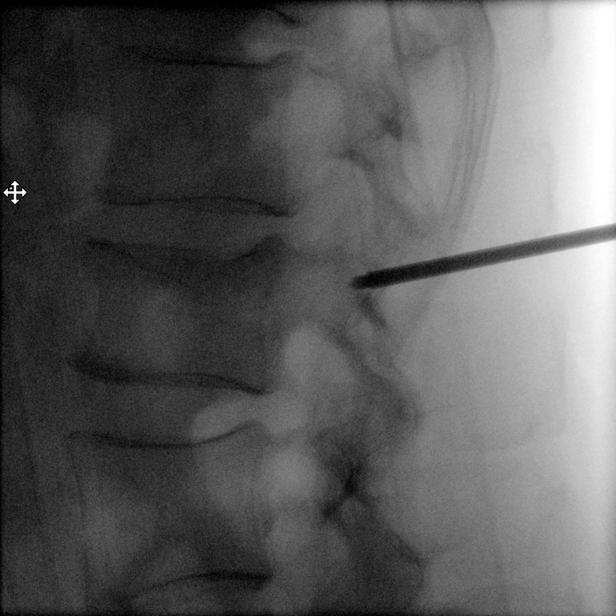
[im 8/28]
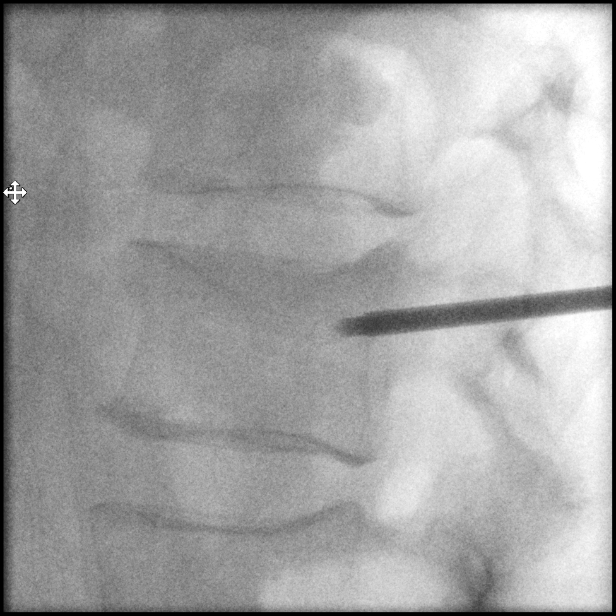
[im 10/28]
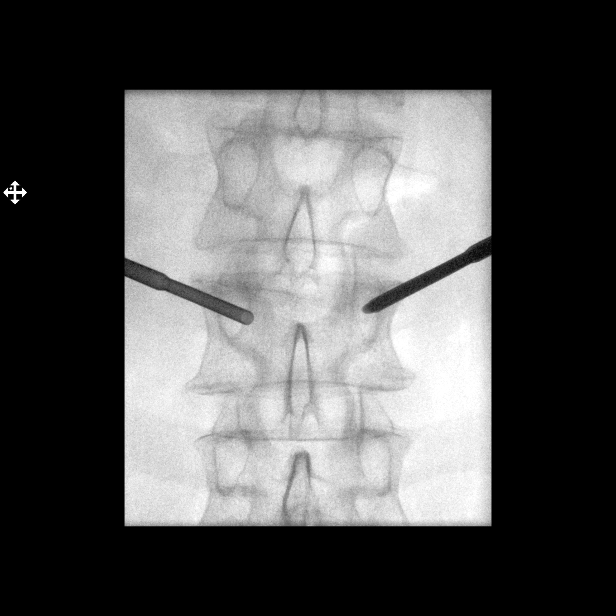
[im 12/28]
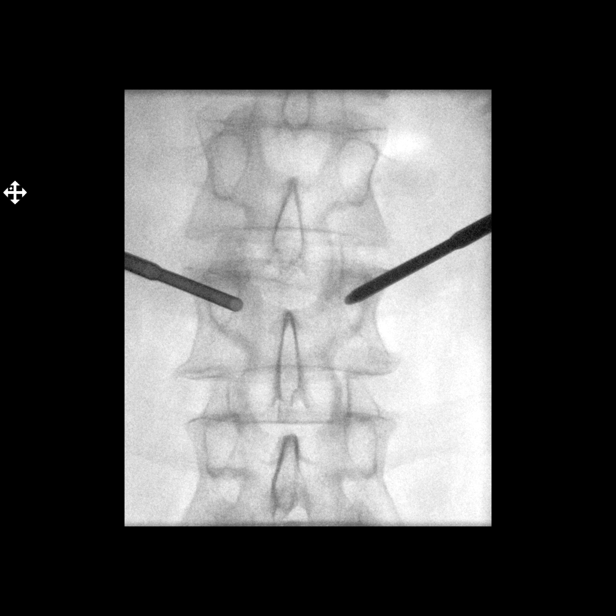
[im 15/28]
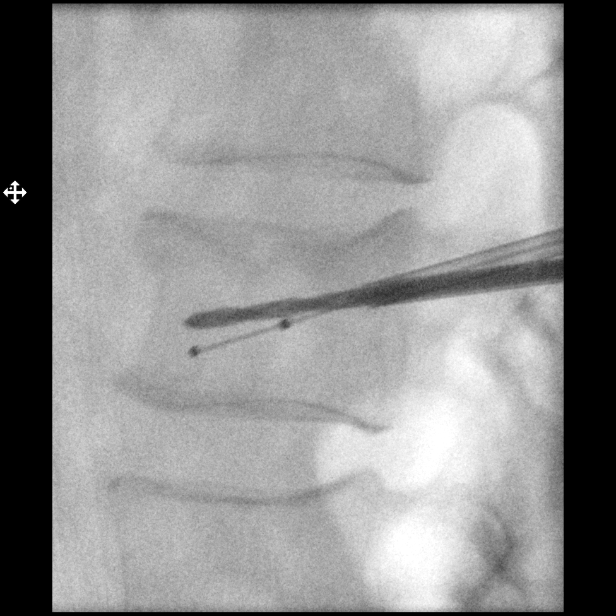
[im 16/28]
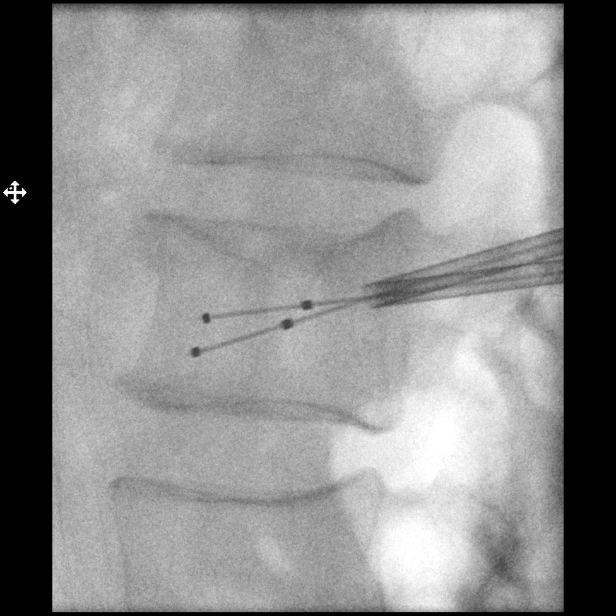
[im 18/28]
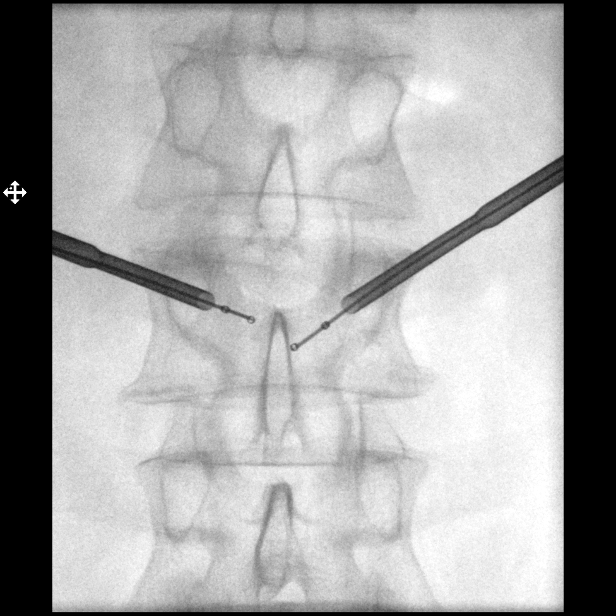
[im 20/28]
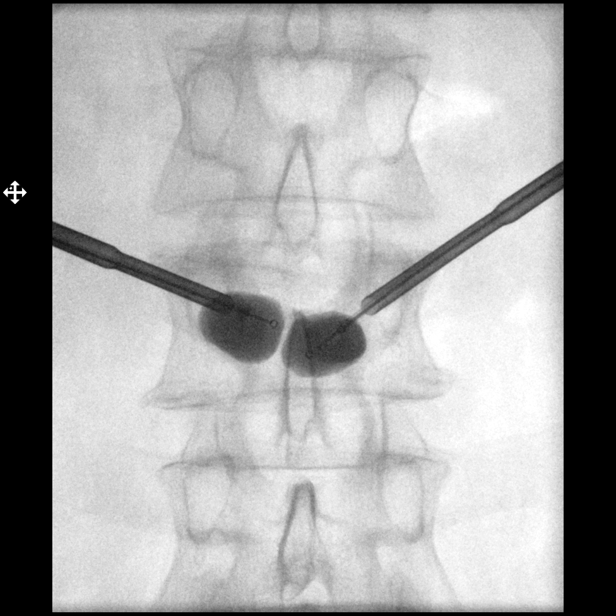
[im 23/28]
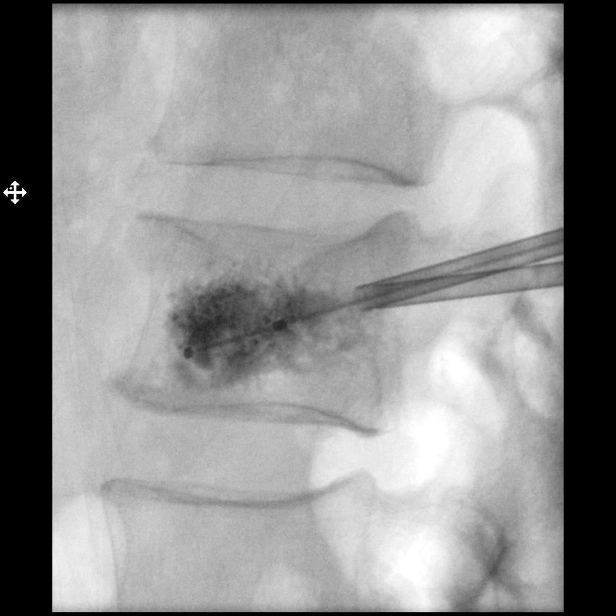
[im 25/28]
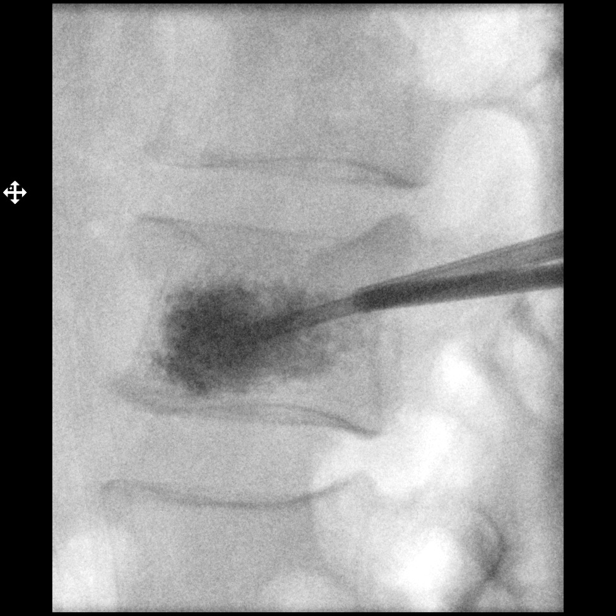
[im 28/28]
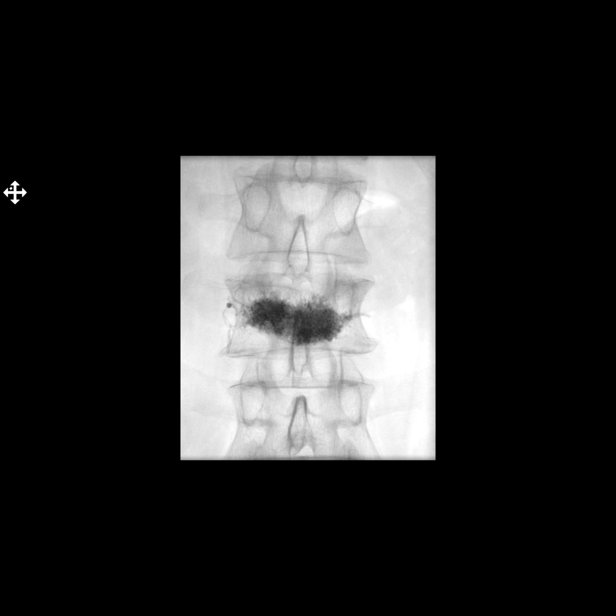

[13 of 24 positions shown; findings below may reference images not displayed]

MR BAJWA, [DATE].

MEDICATIONS:
As antibiotic prophylaxis, Vancomycin 1 gm IV was ordered
pre-procedure and administered intravenously within 1 hour of
incision.

ANESTHESIA/SEDATION:
Sedation by the Anesthesia Team was performed. Please see
anesthesiology log for details.

FLUOROSCOPY:
Fluoroscopic dose; (111.4 mGy)

COMPLICATIONS:
None immediate.

PROCEDURE:
The procedure, risks (including but not limited to bleeding,
infection, organ damage), benefits, and alternatives were explained
to the the patient and/or patient's representative . Questions
regarding the procedure were encouraged and answered. The patient
understands and consents to the procedure.

The patient has suffered a fracture of the L2. It is recommended
that patients aged 50 years or older be evaluated for possible
testing or treatment of osteoporosis. A copy of this procedure
report is sent to the patient's referring physician

The patient was placed prone on the fluoroscopic table. The skin
overlying the lumbosacral junction region was then prepped and
draped in the usual sterile fashion. Maximal barrier sterile
technique was utilized including caps, mask, sterile gowns, sterile
gloves, sterile drape, hand hygiene and skin antiseptic. Intravenous
Fentanyl and Versed were administered as conscious sedation during
continuous cardiorespiratory monitoring by the radiology RN.

The left pedicle at L2 was then infiltrated with 1% lidocaine
followed by the advancement of a trocar needle through the left
pedicle into the posterior one-third of the vertebral body.
Subsequently, the osteo drill was advanced to the anterior third of
the vertebral body. The osteo drill was retracted. Through the
working cannula, a Kyphon inflatable bone tamp was advanced and
positioned with the distal marker approximately 5 mm from the
anterior aspect of the cortex. Appropriate positioning was confirmed
on the AP projection. At this time, the balloon was expanded using
contrast via a Kyphon inflation syringe device via micro tubing.

In similar fashion, the right L2 pedicle was infiltrated with 1%
lidocaine followed by the advancement of a second Kyphon trocar
needle through the right pedicle into the posterior third of the
vertebral body. Subsequently, the osteo drill was coaxially advanced
to the anterior right third. The osteo drill was exchanged for a
Kyphon inflatable bone tamp, advanced to the 5 mm of the anterior
aspect of the cortex. The balloon was then expanded using contrast
as above.

Inflations were continued until there was near apposition with the
superior end plate. At this time, methylmethacrylate mixture was
reconstituted in the Kyphon bone mixing device system. This was then
loaded into the delivery mechanism, attached to Kyphon bone fillers.

The balloons were deflated and removed followed by the instillation
of methylmethacrylate mixture with excellent filling in the AP and
lateral projections. No extravasation was noted in the disk spaces
or posteriorly into the spinal canal. No epidural venous
contamination was seen.

The working cannulae and the bone filler were then retrieved and
removed. Hemostasis was achieved with manual compression. The
patient tolerated the procedure well without immediate
postprocedural complication.
IMPRESSION: Successful L2 vertebral body augmentation using balloon kyphoplasty,
as above.

Per CMS PQRS reporting requirements (PQRS Measure 24): Given the
patient's age of greater than 50 and the fracture site (hip, distal
radius, or spine), the patient should be tested for osteoporosis
using DXA, and the appropriate treatment considered based on the DXA
results.

## 2022-01-06 MED ORDER — GLYCOPYRROLATE 0.2 MG/ML IJ SOLN
INTRAMUSCULAR | Status: DC | PRN
  Administered 2022-01-06: .2 mg via INTRAVENOUS

## 2022-01-06 MED ORDER — FENTANYL CITRATE (PF) 100 MCG/2ML IJ SOLN
INTRAMUSCULAR | Status: AC
  Filled 2022-01-06: qty 2

## 2022-01-06 MED ORDER — LIDOCAINE HCL (PF) 1 % IJ SOLN
INTRAMUSCULAR | Status: AC
Start: 2022-01-06 — End: 2022-01-06
  Administered 2022-01-06: 20 mL
  Filled 2022-01-06: qty 30

## 2022-01-06 MED ORDER — HYDROMORPHONE HCL 1 MG/ML IJ SOLN
1.0000 mg | Freq: Once | INTRAMUSCULAR | Status: AC
  Filled 2022-01-06: qty 1

## 2022-01-06 MED ORDER — OXYCODONE HCL 5 MG/5ML PO SOLN: 5.0000 mg | Freq: Four times a day (QID) | ORAL | 0 refills | Status: DC | PRN

## 2022-01-06 MED ORDER — PROPOFOL 10 MG/ML IV BOLUS
INTRAVENOUS | Status: AC
  Filled 2022-01-06: qty 20

## 2022-01-06 MED ORDER — METHOCARBAMOL 1000 MG/10ML IJ SOLN
500.0000 mg | Freq: Once | INTRAVENOUS | Status: DC
  Filled 2022-01-06: qty 5

## 2022-01-06 MED ORDER — ONDANSETRON HCL 4 MG/2ML IJ SOLN
INTRAMUSCULAR | Status: DC | PRN
  Administered 2022-01-06: 4 mg via INTRAVENOUS

## 2022-01-06 MED ORDER — FENTANYL CITRATE PF 50 MCG/ML IJ SOSY
25.0000 ug | PREFILLED_SYRINGE | INTRAMUSCULAR | Status: DC | PRN
  Filled 2022-01-06: qty 1

## 2022-01-06 MED ORDER — PROPOFOL 500 MG/50ML IV EMUL
INTRAVENOUS | Status: AC
  Filled 2022-01-06: qty 50

## 2022-01-06 MED ORDER — KETAMINE HCL 10 MG/ML IJ SOLN
INTRAMUSCULAR | Status: DC | PRN
  Administered 2022-01-06 (×2): 10 mg via INTRAVENOUS
  Administered 2022-01-06: 20 mg via INTRAVENOUS
  Administered 2022-01-06: 10 mg via INTRAVENOUS

## 2022-01-06 MED ORDER — FENTANYL CITRATE (PF) 100 MCG/2ML IJ SOLN
INTRAMUSCULAR | Status: DC | PRN
  Administered 2022-01-06 (×8): 25 ug via INTRAVENOUS

## 2022-01-06 MED ORDER — BUPIVACAINE HCL (PF) 0.25 % IJ SOLN
INTRAMUSCULAR | Status: DC | PRN
  Administered 2022-01-06: 10 mL

## 2022-01-06 MED ORDER — HYDROMORPHONE HCL 1 MG/ML IJ SOLN
INTRAMUSCULAR | Status: AC
  Administered 2022-01-06: 1 mg via INTRAVENOUS
  Filled 2022-01-06: qty 1

## 2022-01-06 MED ORDER — SODIUM CHLORIDE 0.9 % IV SOLN
INTRAVENOUS | Status: DC
  Filled 2022-01-06: qty 1000

## 2022-01-06 MED ORDER — VANCOMYCIN HCL IN DEXTROSE 1-5 GM/200ML-% IV SOLN
1000.0000 mg | INTRAVENOUS | Status: AC
  Administered 2022-01-06: 1000 mg via INTRAVENOUS
  Filled 2022-01-06 (×2): qty 200

## 2022-01-06 MED ORDER — OXYCODONE-ACETAMINOPHEN 7.5-325 MG PO TABS
1.0000 | ORAL_TABLET | ORAL | Status: DC | PRN
  Filled 2022-01-06: qty 1

## 2022-01-06 MED ORDER — KETAMINE HCL 50 MG/5ML IJ SOSY
PREFILLED_SYRINGE | INTRAMUSCULAR | Status: AC
  Filled 2022-01-06: qty 5

## 2022-01-06 MED ORDER — PROPOFOL 500 MG/50ML IV EMUL
INTRAVENOUS | Status: DC | PRN
  Administered 2022-01-06: 50 ug/kg/min via INTRAVENOUS

## 2022-01-06 NOTE — Anesthesia Postprocedure Evaluation (Signed)
Anesthesia Post Note ? ?Patient: Shawn [name] ? ?Procedure(s) Performed: IR KYPHO LUMBAR INC FX REDUCE BONE BX UNI/BIL CANNULATION INC/IMAGING ? ?Patient location during evaluation: PACU ?Anesthesia Type: General ?Level of consciousness: awake and alert ?Pain management: pain level controlled ?Vital Signs Assessment: post-procedure vital signs reviewed and stable ?Respiratory status: spontaneous breathing, nonlabored ventilation, respiratory function stable and patient connected to nasal cannula oxygen ?Cardiovascular status: blood pressure returned to baseline and stable ?Postop Assessment: no apparent nausea or vomiting ?Anesthetic complications: no ? ? ?No notable events documented. ? ? ?Last Vitals:  ?Vitals:  ? 01/06/22 1515 01/06/22 1600  ?BP:  111/82  ?Pulse: (!) 56 74  ?Resp: 11 16  ?Temp:    ?SpO2: 100% 96%  ?  ?Last Pain:  ?Vitals:  ? 01/06/22 1600  ?TempSrc:   ?PainSc: 7   ? ? ?  ?  ?  ?  ?  ?  ? ?Lenard Simmer ? ? ? ? ?

## 2022-01-06 NOTE — Progress Notes (Signed)
Pt. C/o severe 9/10 pain to kyphoplasty bilat. Areas mid back. Call placed to MD: spoke with PA Ricky Ala. Orders received.  ?

## 2022-01-06 NOTE — Discharge Instructions (Signed)
Kyphoplasty, Care After This sheet gives you information about how to care for yourself after your procedure. Your health care provider may also give you more specific instructions. If you have problems or questions, contact your health care provider. What can I expect after the procedure? After the procedure, it is common to have back pain. Follow these instructions at home: Medicines and Diet Take over-the-counter and prescription medicines only as told by your health care provider. Take over-the-counter or prescription medicines that you normally take.  However, if you are taking Aspirin or an anticoagulant/blood thinner you will be told when you can resume taking these by the healthcare provider.  You may resume a regular diet Eat foods that are high in fiber, such as beans, whole grains, and fresh fruits and vegetables. Limit foods that are high in fat and processed sugars, such as fried or sweet foods. Puncture site care   You may remove bandaid tomorrow after taking a shower.  Replace daily with a clean bandaid until healed. Check your puncture site every day for signs of infection. Check for: Redness, swelling, or pain. Fluid or blood. Warmth. Pus or a bad smell.   Managing pain, stiffness, and swelling If directed, put ice on the painful area. To do this: You may use an ice pack as needed to the injection sites on your back.  Ice to the site 20 minutes on and 20 minutes off, as needed. Place a towel between your skin and the bag.   Activity No driving or operating machinery for 24 hours after your procedure. Do not lift anything that is heavier than a milk jug for 1 to 2 weeks or determined by your physician..  Follow up with your physician in 2 weeks.  Contact a health care provider if: You have a fever greater than 100 degrees You have redness, swelling, or pain at the site of your puncture. You have fluid, blood, or pus coming from the puncture site. You have pain that  gets worse or does not get better with medicine. You develop numbness or weakness in any part of your body. Get help right away if: You have chest pain. You have difficulty breathing. You have weakness, numbness, or tingling in your legs. You cannot control your bladder or bowel movements (incontinence). You suddenly become weak or numb on one side of your body. You become very confused. You have trouble speaking or understanding, or both. These symptoms may represent a serious problem that is an emergency. Do not wait to see if the symptoms will go away. Get medical help right away. Call your local emergency services (911 in the [place] Do not drive yourself to the hospital. Summary Follow instructions from your health care provider about how to take care of your puncture site. Take over-the-counter and prescription medicines only as told by your health care provider. Rest your back and avoid intense physical activity for as long as told by your health care provider. Contact a health care provider if you have pain that gets worse or does not get better with medicine. Keep all follow-up visits. This is important. This information is not intended to replace advice given to you by your health care provider. Make sure you discuss any questions you have with your health care provider. Document Revised: 01/07/2020 Document Reviewed: 01/07/2020 Elsevier Patient Education  2022 [employer]vier Inc[employer]       

## 2022-01-06 NOTE — Transfer of Care (Signed)
Immediate Anesthesia Transfer of Care Note ? ?Patient: Shawn [name] ? ?Procedure(s) Performed: IR KYPHO LUMBAR INC FX REDUCE BONE BX UNI/BIL CANNULATION INC/IMAGING ? ?Patient Location: Cath Lab ? ?Anesthesia Type:General ? ?Level of Consciousness: awake, alert  and oriented ? ?Airway & Oxygen Therapy: Patient Spontanous Breathing and Patient connected to nasal cannula oxygen ? ?Post-op Assessment: Report given to RN and Post -op Vital signs reviewed and stable ? ?Post vital signs: Reviewed and stable ? ?Last Vitals:  ?Vitals Value Taken Time  ?BP 126/83 01/06/22 1407  ?Temp    ?Pulse 61 01/06/22 1409  ?Resp 15 01/06/22 1409  ?SpO2 99 % 01/06/22 1409  ?Vitals shown include unvalidated device data. ? ?Last Pain:  ?Vitals:  ? 01/06/22 1103  ?TempSrc: Oral  ?PainSc: 9   ?   ? ?  ? ?Complications: No notable events documented. ?

## 2022-01-06 NOTE — Anesthesia Preprocedure Evaluation (Signed)
Anesthesia Evaluation  ?Patient identified by MRN, date of birth, ID band ?Patient awake ? ? ? ?Reviewed: ?Allergy & Precautions, H&P , NPO status , Patient's Chart, lab work & pertinent test results, reviewed documented beta blocker date and time  ? ?History of Anesthesia Complications ?Negative for: history of anesthetic complications ? ?Airway ?Mallampati: II ? ?TM Distance: >3 FB ?Neck ROM: full ? ? ? Dental ? ?(+) Dental Advidsory Given, Missing, Teeth Intact ?  ?Pulmonary ?shortness of breath and with exertion, asthma , neg sleep apnea, neg recent URI,  ?  ? ? ? ? ? ? ? Cardiovascular ?Exercise Tolerance: Good ?negative cardio ROS ?Normal cardiovascular exam ?Rate:Normal ? ? ?  ?Neuro/Psych ?neg Seizures  Neuromuscular disease (ALS) CVA, Residual Symptoms negative psych ROS  ? GI/Hepatic ?negative GI ROS, Neg liver ROS,   ?Endo/Other  ?negative endocrine ROS ? Renal/GU ?Renal disease  ? ?  ?Musculoskeletal ? ? Abdominal ?  ?Peds ? Hematology ?negative hematology ROS ?(+)   ?Anesthesia Other Findings ?Past Medical History: ?No date: ALS (amyotrophic lateral sclerosis) (HCC) ?No date: Asthma ?No date: Coronary artery disease ?No date: Hyperlipidemia ?No date: Renal stone ?No date: Stroke [facility]) ? ? Reproductive/Obstetrics ?negative OB ROS ? ?  ? ? ? ? ? ? ? ? ? ? ? ? ? ?  ?  ? ? ? ? ? ? ? ? ?Anesthesia Physical ? ?Anesthesia Plan ? ?ASA: 3 ? ?Anesthesia Plan: General  ? ?Post-op Pain Management:   ? ?Induction: Intravenous ? ?PONV Risk Score and Plan: Propofol infusion and TIVA ? ?Airway Management Planned: Natural Airway, Nasal Cannula and Simple Face Mask ? ?Additional Equipment:  ? ?Intra-op Plan:  ? ?Post-operative Plan:  ? ?Informed Consent: I have reviewed the patients History and Physical, chart, labs and discussed the procedure including the risks, benefits and alternatives for the proposed anesthesia with the patient or authorized representative who has indicated his/her  understanding and acceptance.  ? ? ? ? ? ?Plan Discussed with: CRNA ? ?Anesthesia Plan Comments:   ? ? ? ? ? ? ?Anesthesia Quick Evaluation ? ?

## 2022-01-06 NOTE — Anesthesia Procedure Notes (Signed)
Date/Time: 01/06/2022 12:47 PM ?Performed by: Joanette Gula, Larrissa Stivers, CRNA ?Pre-anesthesia Checklist: Patient identified, Emergency Drugs available, Suction available, Patient being monitored and Timeout performed ?Patient Re-evaluated:Patient Re-evaluated prior to induction ?Oxygen Delivery Method: Nasal cannula ?Induction Type: IV induction ? ? ? ? ?

## 2022-01-06 NOTE — Procedures (Signed)
Vascular and Interventional Radiology Procedure Note ? ?Patient: Shawn [name] ?DOB: 07-16-1961 ?Medical Record Number: [id number] ?Note Date/Time: 01/06/22 2:28 PM  ? ?Performing Physician: Michaelle Birks, MD ?Assistant(s): None ? ?Diagnosis: Symptomatic L2 vertebral body fracture. ? ?Procedure: L2 VERTEBRAL BODY KYPHOPLASTY ? ?Anesthesia: General Anesthesia ?Complications: None ?Estimated Blood Loss: Minimal ?Specimens: Sent for None ? ?Findings:  ?Successful Fluoroscopy-guided L2 vertebral body, bipedicular Kyphoplasty. ?A total of 7 mL PMMA was used. ?Hemostasis of the tract was achieved using Manual Pressure. ? ?Plan: Bed rest for 2 hours. ? ?See detailed procedure note with images in PACS. ?The patient tolerated the procedure well without incident or complication and was returned to Recovery in stable condition.  ? ? ?Michaelle Birks, MD ?[facility] ?[facility] ? ? ?Pager. [phone] ?Clinic. [phone]  ?

## 2022-01-09 ENCOUNTER — Telehealth (HOSPITAL_COMMUNITY): Payer: Self-pay | Admitting: Interventional Radiology

## 2022-01-09 NOTE — Progress Notes (Signed)
Vascular and Interventional Radiology  ?Phone Note ? ?Patient: Shawn [name] ?DOB: 03-18-1961 ?Medical Record Number: [id number] ?Note Date/Time: 01/09/22 8:26 AM  ? ?Admitting Diagnosis: ALS. L2 compression fracture, s/p percutaneous augmentation. ? ?I identified myself to the patient and conveyed my credentials to Shawn [name] ?For medical emergencies, Pt was advised to call 911 or go to the nearest emergency room.  ? ?Assessment  Plan: ?61 y.o. year old male POD 3 s/p L2 KP.  ?Ambulatory procedure under MAC given Hx of ALS. Pt discharged uneventfully. ?VIR reached out in courtesy follow-up. ?  ?[name] reports that Pt is doing well. Moderate access site discomfort, controlled with Rxs.  ?Reported Pain scale 8/10 at worst. Pre reported pain was 10/10. ?Pt taking OTC stool softener as recommended.  ?No concern at this time. ?  ?Follow up ?Pt to follow up with me in Clinic within a month post op. ?  ? ?As part of this Telephone encounter, no in-person exam was conducted. ? ?The patient was physically located in [place] or a state in which I am permitted to provide care. The encounter was reasonable and appropriate under the circumstances given the patient's presentation at the time. ? ?The patient and/or parent/guardian has been advised of the potential risks and limitations of this mode of treatment (including, but not limited to, the absence of in-person examination) and has agreed to be treated using telemedicine. The patient's/patient's family's questions regarding their request have been answered and/or has also been advised to contact their provider?s office for worsening conditions, and seek emergency medical treatment and/or call 911 if the patient deems either necessary. ? ? ?Michaelle Birks, MD ?[facility]iology [facility] ?[facility] ? ? ?Pager. [phone] ?Clinic. [phone] ? ?

## 2022-02-02 ENCOUNTER — Other Ambulatory Visit: Payer: Medicare Other

## 2022-02-02 ENCOUNTER — Ambulatory Visit
Admission: RE | Admit: 2022-02-02 | Discharge: 2022-02-02 | Disposition: A | Discharge status: Home / Self Care | Payer: Medicare Other | Source: Ambulatory Visit | Attending: Interventional Radiology | Admitting: Interventional Radiology

## 2022-02-02 DIAGNOSIS — S32020A Wedge compression fracture of second lumbar vertebra, initial encounter for closed fracture: Secondary | ICD-10-CM

## 2022-02-02 NOTE — Progress Notes (Signed)
? ?Referring Physician(s): ?Chasnis, Marland Kitchen ? ?Chief Complaint: ?The patient is seen in virtual follow up today s/p L2 BKP for fracture ? ?Virtual Visit via Telephone Note ?  ?I connected with [name] at 11:30 AM EST by telephone and verified that I am speaking with the correct person using two identifiers. ?  ?I discussed the limitations, risks, security and privacy concerns of performing an evaluation and management service by telephone and the availability of in-person appointments. ? ?History of present illness: ? ?61 y/o M comorbid w PMHx significant for ALS and chronic asthma known to me s/p L2 BKP for symptomatic L2 vertebral compression fracture s/p fall. ? ?Pt underwent the procedure on 01/06/22 with Anesthesia support, under MAC, secondary to his diaphragmatic weakness. Procedure was uneventful and the Pt was discharged uneventfully on the same day. ? ?VIR MD reached out to Pt in courtesy follow up on POD 3 and other than moderate access site discomfort that was adequately controlled with PO Rxs he reported that he was doing well. Pt is seen in virtual follow up 1 month post op and reports that he is doing much better. He endorses that his access site discomfort has resolved but has residual R back and hip pain that was reportedly present before the procedure. He has not worn his LSO since the procedure. No concern from Pt perspective. ? ?Review of Systems: Pertinent positives are indicated in the HPI above.  All other systems are negative. ? ?Past Medical History:  ?Diagnosis Date  ? ALS (amyotrophic lateral sclerosis) ([place])   ? Asthma   ? Coronary artery disease   ? Hyperlipidemia   ? Renal stone   ? Stroke [facility])   ? ? ?Past Surgical History:  ?Procedure Laterality Date  ? CARDIAC CATHETERIZATION  2006  ? negative  ? CYSTOSCOPY W/ URETERAL STENT REMOVAL Left 06/11/2015  ? Procedure: CYSTOSCOPY WITH STENT EXCHANGE;  Surgeon: Cleon Gustin, MD;  Location: [facility] ORS;  Service: Urology;   Laterality: Left;  ? CYSTOSCOPY WITH STENT PLACEMENT Left 05/20/2015  ? Procedure: CYSTOSCOPY WITH STENT PLACEMENT;  Surgeon: Ardis Hughs, MD;  Location: [facility] ORS;  Service: Urology;  Laterality: Left;  ? CYSTOSCOPY/RETROGRADE/URETEROSCOPY/STONE EXTRACTION WITH BASKET  2000  ? pt. unsure of which side done  ? CYSTOSCOPY/URETEROSCOPY/HOLMIUM LASER Left 06/11/2015  ? Procedure: CYSTOSCOPY/URETEROSCOPY/HOLMIUM LASER;  Surgeon: Cleon Gustin, MD;  Location: [facility] ORS;  Service: Urology;  Laterality: Left;  ? IR KYPHO LUMBAR INC FX REDUCE BONE BX UNI/BIL CANNULATION INC/IMAGING  01/06/2022  ? KNEE SURGERY Right 84,96, 2003  ? x3  ? SHOULDER SURGERY Left 2010  ? ? ?Allergies: ?Aspirin, Compazine [prochlorperazine], Morphine and related, Naproxen, Penicillins, and Tramadol ? ?Medications: ?Prior to Admission medications   ?Medication Sig Start Date End Date Taking? Authorizing Provider  ?acetaminophen (TYLENOL) 160 MG/5ML solution Take 480 mg by mouth every 6 (six) hours as needed for mild pain or moderate pain.    [provider]  ?albuterol (VENTOLIN HFA) 108 (90 Base) MCG/ACT inhaler Inhale 1-2 puffs into the lungs every 6 (six) hours as needed for wheezing or shortness of breath.    [provider]  ?atorvastatin (LIPITOR) 20 MG tablet Take 20 mg by mouth daily. 06/25/20   [provider]  ?Baclofen 5 MG TABS Take 10 mg by mouth 3 (three) times daily.    [provider]  ?fluticasone furoate-vilanterol (BREO ELLIPTA) 100-25 MCG/ACT AEPB Inhale 1 puff into the lungs in the morning.  [provider]  ?gabapentin (NEURONTIN) 250 MG/5ML solution Take 250 mg by mouth at bedtime.    [provider]  ?montelukast (SINGULAIR) 10 MG tablet Take 10 mg by mouth at bedtime.    [provider]  ?oxyCODONE (OXY IR/ROXICODONE) 5 MG immediate release tablet Take 1 tablet (5 mg total) by mouth every 4 (four) hours as needed for moderate pain. 11/09/21   Aline August,  MD  ?oxyCODONE (ROXICODONE) 5 MG/5ML solution Take 5 mLs (5 mg total) by mouth every 6 (six) hours as needed for severe pain. 01/06/22   Chaden Doom, Wille Glaser, MD  ?riluzole (RILUTEK) 50 MG tablet Take 50 mg by mouth 2 (two) times daily.    [provider]  ?senna-docusate (SENOKOT-S) 8.6-50 MG tablet Take 1 tablet by mouth at bedtime. 11/09/21   Aline August, MD  ?traZODone (DESYREL) 100 MG tablet Take 100 mg by mouth at bedtime.    [provider]  ?  ? ?Family History  ?Problem Relation Age of Onset  ? Cancer Mother   ? Heart failure Mother   ? Hypertension Mother   ? CVA Mother   ? Diabetes Other   ? Other Father   ?     unknown medical history  ? ? ?Social History  ? ?Socioeconomic History  ? Marital status: Single  ?  Spouse name: Not on file  ? Number of children: Not on file  ? Years of education: Not on file  ? Highest education level: Not on file  ?Occupational History  ? Not on file  ?Tobacco Use  ? Smoking status: Never  ? Smokeless tobacco: Never  ?Vaping Use  ? Vaping Use: Never used  ?Substance and Sexual Activity  ? Alcohol use: Not Currently  ?  Alcohol/week: 0.0 standard drinks  ?  Comment: occasionally  ? Drug use: Never  ? Sexual activity: Not on file  ?Other Topics Concern  ? Not on file  ?Social History Narrative  ? Not on file  ? ?Social Determinants of Health  ? ?Financial Resource Strain: Not on file  ?Food Insecurity: Not on file  ?Transportation Needs: Not on file  ?Physical Activity: Not on file  ?Stress: Not on file  ?Social Connections: Not on file  ? ? ?Vital Signs: ?There were no vitals taken for this visit. ? ?Physical Exam ?Deferred secondary to virtual visit. ? ? ?Imaging: ? ?L2 vertebral body kyphoplasty, 01/06/2022 ?Images reviewed, demonstrating excellent filling in the AP and lateral projections.  ?No extravasation in the disk space or posteriorly into the spinal canal. No epidural venous contamination  ? ? ? ?Labs: ? ?CBC: ?Recent Labs  ?  11/09/21 ?0405 11/12/21 ?2037  11/13/21 ?0435 01/06/22 ?1117  ?WBC 5.4 6.0 4.8 5.2  ?HGB 11.9* 14.0 14.4 13.5  ?HCT 35.1* 41.0 43.0 40.6  ?PLT 192 243 248 207  ? ? ?COAGS: ?Recent Labs  ?  01/06/22 ?1117  ?INR 1.0  ? ? ?BMP: ?Recent Labs  ?  11/08/21 ?0327 11/09/21 ?0405 11/12/21 ?2037 11/13/21 ?0435 11/13/21 ?0800  ?NA 136 138 134* 139  --   ?K 3.6 3.7 3.8 5.5* 4.2  ?CL 106 107 103 103  --   ?CO2 _0 --   ?GLUCOSE 95 105* 140* 116*  --   ?BUN _1 --   ?CALCIUM 9.0 9.0 9.5 10.0  --   ?CREATININE 0.65 0.74 0.78 0.85  --   ?GFRNONAA >60 >60 >60 >60  --   ? ? ?  LIVER FUNCTION TESTS: ?Recent Labs  ?  11/07/21 ?0931 11/09/21 ?0405  ?BILITOT 0.8 0.8  ?AST 23 20  ?ALT 25 20  ?ALKPHOS 87 76  ?PROT 6.6 5.9*  ?ALBUMIN 4.0 3.3*  ? ? ?Assessment and Plan: ? ?61 y/o M comorbid w PMHx significant for ALS and chronic asthma s/p L2 BKP for symptomatic L2 vertebral compression fracture from fall. ? ?*No access site or procedural complication concern. ?*Pt endorses residual R back and hip pain that was reportedly present before the procedure. Question LoH change. If persists, may require additional imaging to evaluate. ?*Follow up with [facility] PRN. ? ?Per CMS PQRS reporting requirements (PQRS Measure 24): Given the patient's age of greater than 84 and the fracture site (hip, distal radius, or spine), the patient should be tested for osteoporosis using DXA, and the appropriate treatment considered based on the DXA ?results. ? ?Thank you for allowing [place] to participate in the care of this Patient. ?Please contact the [place] Team with questions, concerns, or if new issues arise. ? ?Electronically Signed: ? ?Michaelle Birks, MD ?[facility]iology [facility] ?[facility] ? ? ?Pager. [phone] ?Clinic. [phone] ? ? ?I spent a total of 25 Minutes of non-face-to-face time in clinical consultation, greater than 50% of which was counseling/coordinating care for Mr. ULRIC SALZMAN evaluation for L2 fracture management  and treatment. ?  ?

## 2022-04-03 ENCOUNTER — Emergency Department: Payer: Medicare Other

## 2022-04-03 ENCOUNTER — Other Ambulatory Visit: Payer: Self-pay

## 2022-04-03 ENCOUNTER — Inpatient Hospital Stay
Admission: EM | Admit: 2022-04-03 | Discharge: 2022-04-05 | DRG: 543 | Disposition: A | Discharge status: Home Health | Payer: Medicare Other | Attending: Hospitalist | Admitting: Hospitalist

## 2022-04-03 ENCOUNTER — Encounter: Payer: Self-pay | Admitting: Emergency Medicine

## 2022-04-03 DIAGNOSIS — I700 Atherosclerosis of aorta: Secondary | ICD-10-CM | POA: Diagnosis present

## 2022-04-03 DIAGNOSIS — M4856XA Collapsed vertebra, not elsewhere classified, lumbar region, initial encounter for fracture: Secondary | ICD-10-CM | POA: Diagnosis not present

## 2022-04-03 DIAGNOSIS — S32010A Wedge compression fracture of first lumbar vertebra, initial encounter for closed fracture: Secondary | ICD-10-CM | POA: Diagnosis present

## 2022-04-03 DIAGNOSIS — Z833 Family history of diabetes mellitus: Secondary | ICD-10-CM | POA: Diagnosis not present

## 2022-04-03 DIAGNOSIS — R531 Weakness: Secondary | ICD-10-CM | POA: Diagnosis present

## 2022-04-03 DIAGNOSIS — Z885 Allergy status to narcotic agent status: Secondary | ICD-10-CM

## 2022-04-03 DIAGNOSIS — E785 Hyperlipidemia, unspecified: Secondary | ICD-10-CM | POA: Diagnosis present

## 2022-04-03 DIAGNOSIS — D72829 Elevated white blood cell count, unspecified: Secondary | ICD-10-CM | POA: Diagnosis present

## 2022-04-03 DIAGNOSIS — J4520 Mild intermittent asthma, uncomplicated: Secondary | ICD-10-CM | POA: Diagnosis not present

## 2022-04-03 DIAGNOSIS — R471 Dysarthria and anarthria: Secondary | ICD-10-CM | POA: Diagnosis present

## 2022-04-03 DIAGNOSIS — I2510 Atherosclerotic heart disease of native coronary artery without angina pectoris: Secondary | ICD-10-CM | POA: Diagnosis present

## 2022-04-03 DIAGNOSIS — Z66 Do not resuscitate: Secondary | ICD-10-CM | POA: Diagnosis present

## 2022-04-03 DIAGNOSIS — Z7951 Long term (current) use of inhaled steroids: Secondary | ICD-10-CM | POA: Diagnosis not present

## 2022-04-03 DIAGNOSIS — J45909 Unspecified asthma, uncomplicated: Secondary | ICD-10-CM | POA: Diagnosis present

## 2022-04-03 DIAGNOSIS — W010XXA Fall on same level from slipping, tripping and stumbling without subsequent striking against object, initial encounter: Secondary | ICD-10-CM | POA: Diagnosis present

## 2022-04-03 DIAGNOSIS — M47816 Spondylosis without myelopathy or radiculopathy, lumbar region: Secondary | ICD-10-CM | POA: Diagnosis present

## 2022-04-03 DIAGNOSIS — Z79899 Other long term (current) drug therapy: Secondary | ICD-10-CM | POA: Diagnosis not present

## 2022-04-03 DIAGNOSIS — Z8249 Family history of ischemic heart disease and other diseases of the circulatory system: Secondary | ICD-10-CM

## 2022-04-03 DIAGNOSIS — Z880 Allergy status to penicillin: Secondary | ICD-10-CM

## 2022-04-03 DIAGNOSIS — M48061 Spinal stenosis, lumbar region without neurogenic claudication: Secondary | ICD-10-CM | POA: Diagnosis present

## 2022-04-03 DIAGNOSIS — G1221 Amyotrophic lateral sclerosis: Secondary | ICD-10-CM | POA: Diagnosis present

## 2022-04-03 DIAGNOSIS — Y92009 Unspecified place in unspecified non-institutional (private) residence as the place of occurrence of the external cause: Secondary | ICD-10-CM | POA: Diagnosis not present

## 2022-04-03 DIAGNOSIS — Z87442 Personal history of urinary calculi: Secondary | ICD-10-CM | POA: Diagnosis not present

## 2022-04-03 DIAGNOSIS — Z886 Allergy status to analgesic agent status: Secondary | ICD-10-CM | POA: Diagnosis not present

## 2022-04-03 DIAGNOSIS — Z8673 Personal history of transient ischemic attack (TIA), and cerebral infarction without residual deficits: Secondary | ICD-10-CM | POA: Diagnosis not present

## 2022-04-03 DIAGNOSIS — Z823 Family history of stroke: Secondary | ICD-10-CM

## 2022-04-03 LAB — CBC WITH DIFFERENTIAL/PLATELET
Abs Immature Granulocytes: 0.05 10*3/uL (ref 0.00–0.07)
Basophils Absolute: 0 10*3/uL (ref 0.0–0.1)
Basophils Relative: 0 %
Eosinophils Absolute: 0 10*3/uL (ref 0.0–0.5)
Eosinophils Relative: 0 %
HCT: 42.3 % (ref 39.0–52.0)
Hemoglobin: 14 g/dL (ref 13.0–17.0)
Immature Granulocytes: 0 %
Lymphocytes Relative: 11 %
Lymphs Abs: 1.4 10*3/uL (ref 0.7–4.0)
MCH: 33.1 pg (ref 26.0–34.0)
MCHC: 33.1 g/dL (ref 30.0–36.0)
MCV: 100 fL (ref 80.0–100.0)
Monocytes Absolute: 0.8 10*3/uL (ref 0.1–1.0)
Monocytes Relative: 6 %
Neutro Abs: 10.3 10*3/uL — ABNORMAL HIGH (ref 1.7–7.7)
Neutrophils Relative %: 83 %
Platelets: 202 10*3/uL (ref 150–400)
RBC: 4.23 MIL/uL (ref 4.22–5.81)
RDW: 12.7 % (ref 11.5–15.5)
WBC: 12.5 10*3/uL — ABNORMAL HIGH (ref 4.0–10.5)
nRBC: 0 % (ref 0.0–0.2)

## 2022-04-03 LAB — COMPREHENSIVE METABOLIC PANEL
ALT: 33 U/L (ref 0–44)
AST: 28 U/L (ref 15–41)
Albumin: 4.4 g/dL (ref 3.5–5.0)
Alkaline Phosphatase: 101 U/L (ref 38–126)
Anion gap: 8 (ref 5–15)
BUN: 15 mg/dL (ref 8–23)
CO2: 23 mmol/L (ref 22–32)
Calcium: 9.3 mg/dL (ref 8.9–10.3)
Chloride: 109 mmol/L (ref 98–111)
Creatinine, Ser: 0.75 mg/dL (ref 0.61–1.24)
GFR, Estimated: >60 mL/min (ref >60)
Glucose, Bld: 123 mg/dL — ABNORMAL HIGH (ref 70–99)
Potassium: 4.2 mmol/L (ref 3.5–5.1)
Sodium: 140 mmol/L (ref 135–145)
Total Bilirubin: 1.1 mg/dL (ref 0.3–1.2)
Total Protein: 6.9 g/dL (ref 6.5–8.1)

## 2022-04-03 MED ORDER — ACETAMINOPHEN 325 MG PO TABS: 650.0000 mg | ORAL_TABLET | Freq: Once | ORAL | Status: DC

## 2022-04-03 MED ORDER — ACETAMINOPHEN 325 MG PO TABS: 650.0000 mg | ORAL_TABLET | Freq: Four times a day (QID) | ORAL | Status: DC | PRN

## 2022-04-03 MED ORDER — SODIUM CHLORIDE 0.9 % IV SOLN: INTRAVENOUS | Status: DC

## 2022-04-03 MED ORDER — MAGNESIUM HYDROXIDE 400 MG/5ML PO SUSP: 30.0000 mL | Freq: Every day | ORAL | Status: DC | PRN

## 2022-04-03 MED ORDER — HYDROCODONE-ACETAMINOPHEN 5-325 MG PO TABS
2.0000 | ORAL_TABLET | Freq: Once | ORAL | Status: DC
Start: 2022-04-03 — End: 2022-04-03
  Filled 2022-04-03: qty 2

## 2022-04-03 MED ORDER — ATORVASTATIN CALCIUM 20 MG PO TABS
20.0000 mg | ORAL_TABLET | Freq: Every day | ORAL | Status: DC
  Administered 2022-04-04: 20 mg via ORAL
  Filled 2022-04-03 (×2): qty 1

## 2022-04-03 MED ORDER — OXYCODONE HCL 5 MG PO TABS: 5.0000 mg | ORAL_TABLET | Freq: Once | ORAL | Status: DC

## 2022-04-03 MED ORDER — ONDANSETRON HCL 4 MG/2ML IJ SOLN: 4.0000 mg | Freq: Four times a day (QID) | INTRAMUSCULAR | Status: DC | PRN

## 2022-04-03 MED ORDER — SENNOSIDES-DOCUSATE SODIUM 8.6-50 MG PO TABS
1.0000 | ORAL_TABLET | Freq: Every day | ORAL | Status: DC
  Administered 2022-04-03 – 2022-04-04 (×2): 1 via ORAL
  Filled 2022-04-03 (×2): qty 1

## 2022-04-03 MED ORDER — OXYCODONE HCL 5 MG PO TABS
5.0000 mg | ORAL_TABLET | ORAL | Status: DC | PRN
  Administered 2022-04-04 – 2022-04-05 (×6): 5 mg via ORAL
  Filled 2022-04-03 (×7): qty 1

## 2022-04-03 MED ORDER — BACLOFEN 10 MG PO TABS
10.0000 mg | ORAL_TABLET | Freq: Three times a day (TID) | ORAL | Status: DC
  Administered 2022-04-03 – 2022-04-04 (×4): 10 mg via ORAL
  Filled 2022-04-03 (×5): qty 1

## 2022-04-03 MED ORDER — RILUZOLE 50 MG PO TABS
50.0000 mg | ORAL_TABLET | Freq: Two times a day (BID) | ORAL | Status: DC
  Administered 2022-04-04: 50 mg via ORAL

## 2022-04-03 MED ORDER — ALBUTEROL SULFATE (2.5 MG/3ML) 0.083% IN NEBU: 2.5000 mg | INHALATION_SOLUTION | Freq: Four times a day (QID) | RESPIRATORY_TRACT | Status: DC | PRN

## 2022-04-03 MED ORDER — SODIUM CHLORIDE 0.9 % IV BOLUS
1000.0000 mL | Freq: Once | INTRAVENOUS | Status: AC
  Administered 2022-04-03: 1000 mL via INTRAVENOUS

## 2022-04-03 MED ORDER — HYDROMORPHONE HCL 1 MG/ML IJ SOLN
1.0000 mg | Freq: Once | INTRAMUSCULAR | Status: AC
  Administered 2022-04-03: 1 mg via INTRAVENOUS
  Filled 2022-04-03: qty 1

## 2022-04-03 MED ORDER — ONDANSETRON HCL 4 MG PO TABS: 4.0000 mg | ORAL_TABLET | Freq: Four times a day (QID) | ORAL | Status: DC | PRN

## 2022-04-03 MED ORDER — GABAPENTIN 250 MG/5ML PO SOLN
250.0000 mg | Freq: Every day | ORAL | Status: DC
  Administered 2022-04-03 – 2022-04-04 (×2): 250 mg via ORAL
  Filled 2022-04-03 (×2): qty 5

## 2022-04-03 MED ORDER — FENTANYL CITRATE PF 50 MCG/ML IJ SOSY
50.0000 ug | PREFILLED_SYRINGE | Freq: Once | INTRAMUSCULAR | Status: AC
  Administered 2022-04-03: 50 ug via INTRAVENOUS
  Filled 2022-04-03: qty 1

## 2022-04-03 MED ORDER — HYDROMORPHONE HCL 1 MG/ML IJ SOLN
0.5000 mg | Freq: Once | INTRAMUSCULAR | Status: AC
  Administered 2022-04-03: 0.5 mg via INTRAMUSCULAR
  Filled 2022-04-03: qty 0.5

## 2022-04-03 MED ORDER — FLUTICASONE FUROATE-VILANTEROL 100-25 MCG/ACT IN AEPB
1.0000 | INHALATION_SPRAY | Freq: Every morning | RESPIRATORY_TRACT | Status: DC
Start: 2022-04-04 — End: 2022-04-05
  Administered 2022-04-04 – 2022-04-05 (×2): 1 via RESPIRATORY_TRACT
  Filled 2022-04-03: qty 28

## 2022-04-03 MED ORDER — TRAZODONE HCL 50 MG PO TABS: 25.0000 mg | ORAL_TABLET | Freq: Every evening | ORAL | Status: DC | PRN

## 2022-04-03 MED ORDER — TRAZODONE HCL 100 MG PO TABS
100.0000 mg | ORAL_TABLET | Freq: Every day | ORAL | Status: DC
  Administered 2022-04-03 – 2022-04-04 (×2): 100 mg via ORAL
  Filled 2022-04-03 (×2): qty 1

## 2022-04-03 MED ORDER — MONTELUKAST SODIUM 10 MG PO TABS
10.0000 mg | ORAL_TABLET | Freq: Every day | ORAL | Status: DC
  Administered 2022-04-03 – 2022-04-04 (×2): 10 mg via ORAL
  Filled 2022-04-03 (×3): qty 1

## 2022-04-03 MED ORDER — ACETAMINOPHEN 650 MG RE SUPP: 650.0000 mg | Freq: Four times a day (QID) | RECTAL | Status: DC | PRN

## 2022-04-03 NOTE — Assessment & Plan Note (Addendum)
-   continue statin therapy. 

## 2022-04-03 NOTE — ED Notes (Signed)
Pt admitted to 138 via stretcher

## 2022-04-03 NOTE — H&P (Signed)
[place]   PATIENT NAME: Shawn [name]    MR#:  [id number]  DATE OF BIRTH:  02-02-1961  DATE OF ADMISSION:  04/03/2022  PRIMARY CARE PHYSICIAN: Inc, [place]   Patient is coming from: Home  REQUESTING/REFERRING PHYSICIAN: Duffy Bruce, MD/Simpson, Apolinar Junes, PA  CHIEF COMPLAINT:   Chief Complaint  Patient presents with   Fall   Back Pain    HISTORY OF PRESENT ILLNESS:  Shawn [name] is a 61 y.o. Caucasian male with medical history significant for asthma, coronary artery disease, CVA, urolithiasis and ALS, who presents to the emergency room with a [place] of fall with subsequent low back pain and subjective bilateral lower extremity numbness worsening weakness.  He denied any tinnitus or vertigo.  No presyncope or syncope.  No chest pain or palpitations.  No nausea or vomiting or abdominal pain.  No fever or chills.  No dysuria, oliguria or hematuria or flank pain.  No urinary or stool incontinence.  He is able to ambulate with a walker at baseline and has some dysarthria associated with his ALS that was diagnosed over a year ago.  He lives alone and has home health..  ED Course: When he came to the ER vital signs were within normal.  Labs revealed unremarkable CMP.  CBC showed leukocytosis 12.3 with neutrophilia. EKG as reviewed by me : EKG showed normal sinus rhythm with a rate of 69 with low voltage QRS and Q waves inferiorly. Imaging: C-spine CT showed no acute displaced fracture or traumatic listhesis and head CT without contrast revealed no acute intracranial abnormalities.  Lumbar CT however showed interval development of an L1 compression fracture with at least mid 25% vertebral body height loss.  It showed chronic L2 compression fracture status post kyphoplasty and nonobstructive 2 mm left nephrolithiasis as well as aortic atherosclerosis.  The patient was given 0.5 mg of IM Dilaudid, 1 mg of IV Dilaudid, 1 L bolus of IV normal saline and 50  mcg IV fentanyl.  He admitted to a medical bed for further evaluation and management. PAST MEDICAL HISTORY:   Past Medical History:  Diagnosis Date   ALS (amyotrophic lateral sclerosis) ([place])    Asthma    Coronary artery disease    Hyperlipidemia    Renal stone    Stroke [facility])     PAST SURGICAL HISTORY:   Past Surgical History:  Procedure Laterality Date   CARDIAC CATHETERIZATION  2006   negative   CYSTOSCOPY W/ URETERAL STENT REMOVAL Left 06/11/2015   Procedure: CYSTOSCOPY WITH STENT EXCHANGE;  Surgeon: Cleon Gustin, MD;  Location: [facility] ORS;  Service: Urology;  Laterality: Left;   CYSTOSCOPY WITH STENT PLACEMENT Left 05/20/2015   Procedure: CYSTOSCOPY WITH STENT PLACEMENT;  Surgeon: Ardis Hughs, MD;  Location: [facility] ORS;  Service: Urology;  Laterality: Left;   CYSTOSCOPY/RETROGRADE/URETEROSCOPY/STONE EXTRACTION WITH BASKET  2000   pt. unsure of which side done   CYSTOSCOPY/URETEROSCOPY/HOLMIUM LASER Left 06/11/2015   Procedure: CYSTOSCOPY/URETEROSCOPY/HOLMIUM LASER;  Surgeon: Cleon Gustin, MD;  Location: [facility] ORS;  Service: Urology;  Laterality: Left;   IR KYPHO LUMBAR INC FX REDUCE BONE BX UNI/BIL CANNULATION INC/IMAGING  01/06/2022   KNEE SURGERY Right 84,96, 2003   x3   SHOULDER SURGERY Left 2010    SOCIAL HISTORY:   Social History   Tobacco Use   Smoking status: Never   Smokeless tobacco: Never  Substance Use Topics   Alcohol use: Not Currently    Alcohol/week:  0.0 standard drinks of alcohol    Comment: occasionally    FAMILY HISTORY:   Family History  Problem Relation Age of Onset   Cancer Mother    Heart failure Mother    Hypertension Mother    CVA Mother    Diabetes Other    Other Father        unknown medical history    DRUG ALLERGIES:   Allergies  Allergen Reactions   Aspirin Other (See Comments), Itching, Shortness Of Breath and Swelling    Upset stomach  GI Upset   Compazine [Prochlorperazine] Other (See Comments)    Added per  pt. Pt had tachycardia chest discomfort   Morphine And Related Itching   Naproxen Other (See Comments)    Upset stomach  GI Upset   Penicillins Rash   Tramadol Itching    REVIEW OF SYSTEMS:   ROS As per history of present illness. All pertinent systems were reviewed above. Constitutional, HEENT, cardiovascular, respiratory, GI, GU, musculoskeletal, neuro, psychiatric, endocrine, integumentary and hematologic systems were reviewed and are otherwise negative/unremarkable except for positive findings mentioned above in the HPI.   MEDICATIONS AT HOME:   Prior to Admission medications   Medication Sig Start Date End Date Taking? Authorizing Provider  acetaminophen (TYLENOL) 160 MG/5ML solution Take 480 mg by mouth every 6 (six) hours as needed for mild pain or moderate pain.    [provider]  albuterol (VENTOLIN HFA) 108 (90 Base) MCG/ACT inhaler Inhale 1-2 puffs into the lungs every 6 (six) hours as needed for wheezing or shortness of breath.    [provider]  atorvastatin (LIPITOR) 20 MG tablet Take 20 mg by mouth daily. 06/25/20   [provider]  Baclofen 5 MG TABS Take 10 mg by mouth 3 (three) times daily.    [provider]  fluticasone furoate-vilanterol (BREO ELLIPTA) 100-25 MCG/ACT AEPB Inhale 1 puff into the lungs in the morning.    [provider]  gabapentin (NEURONTIN) 250 MG/5ML solution Take 250 mg by mouth at bedtime.    [provider]  montelukast (SINGULAIR) 10 MG tablet Take 10 mg by mouth at bedtime.    [provider]  oxyCODONE (OXY IR/ROXICODONE) 5 MG immediate release tablet Take 1 tablet (5 mg total) by mouth every 4 (four) hours as needed for moderate pain. 11/09/21   Glade Lloyd, MD  oxyCODONE (ROXICODONE) 5 MG/5ML solution Take 5 mLs (5 mg total) by mouth every 6 (six) hours as needed for severe pain. 01/06/22   Mugweru, Cletis Athens, MD  riluzole (RILUTEK) 50 MG tablet Take 50 mg by mouth 2 (two) times daily.     [provider]  senna-docusate (SENOKOT-S) 8.6-50 MG tablet Take 1 tablet by mouth at bedtime. 11/09/21   Glade Lloyd, MD  traZODone (DESYREL) 100 MG tablet Take 100 mg by mouth at bedtime.    [provider]      VITAL SIGNS:  Blood pressure (!) 133/93, pulse 88, temperature 97.6 F (36.4 C), temperature source Oral, resp. rate 16, height 5\' 10"  (1.778 m), weight 47.2 kg, SpO2 97 %.  PHYSICAL EXAMINATION:  Physical Exam  GENERAL:  61 y.o.-year-old Caucasian male patient lying in the bed with no acute distress.  EYES: Pupils equal, round, reactive to light and accommodation. No scleral icterus. Extraocular muscles intact.  HEENT: Head atraumatic, normocephalic. Oropharynx and nasopharynx clear.  NECK:  Supple, no jugular venous distention. No thyroid enlargement, no tenderness.  LUNGS: Normal breath sounds bilaterally, no  wheezing, rales,rhonchi or crepitation. No use of accessory muscles of respiration.  CARDIOVASCULAR: Regular rate and rhythm, S1, S2 normal. No murmurs, rubs, or gallops.  ABDOMEN: Soft, nondistended, nontender. Bowel sounds present. No organomegaly or mass.  EXTREMITIES: No pedal edema, cyanosis, or clubbing.  NEUROLOGIC: Cranial nerves II through XII are intact. Muscle strength 5/5 in all extremities. Sensation intact. Gait not checked.  PSYCHIATRIC: The patient is alert and oriented x 3.  Normal affect and good eye contact. SKIN: No obvious rash, lesion, or ulcer.   LABORATORY PANEL:   CBC Recent Labs  Lab 04/03/22 2013  WBC 12.5*  HGB 14.0  HCT 42.3  PLT 202   ------------------------------------------------------------------------------------------------------------------  Chemistries  Recent Labs  Lab 04/03/22 2013  NA 140  K 4.2  CL 109  CO2 23  GLUCOSE 123*  BUN 15  CREATININE 0.75  CALCIUM 9.3  AST 28  ALT 33  ALKPHOS 101  BILITOT 1.1    ------------------------------------------------------------------------------------------------------------------  Cardiac Enzymes No results for input(s): "TROPONINI" in the last 168 hours. ------------------------------------------------------------------------------------------------------------------  RADIOLOGY:  DG Tibia/Fibula Right  Result Date: 04/03/2022 CLINICAL DATA:  Recent fall with right leg pain, initial encounter EXAM: RIGHT TIBIA AND FIBULA - 2 VIEW COMPARISON:  None Available. FINDINGS: No acute fracture or dislocation is noted. No soft tissue abnormality is seen. No joint effusion noted. Mild meniscal calcifications are seen. IMPRESSION: No acute bony abnormality noted. Electronically Signed   By: Inez Catalina M.D.   On: 04/03/2022 20:40   CT Lumbar Spine Wo Contrast  Result Date: 04/03/2022 CLINICAL DATA:  Back trauma, no prior imaging (Age >= 16y). Pt had a fall at home, pt c/o back and R elbow pain. States that he was walking and his walker folded up on him and he fell backwards, pt states he landed on his back and hit the back of head. EXAM: CT LUMBAR SPINE WITHOUT CONTRAST TECHNIQUE: Multidetector CT imaging of the lumbar spine was performed without intravenous contrast administration. Multiplanar CT image reconstructions were also generated. RADIATION DOSE REDUCTION: This exam was performed according to the departmental dose-optimization program which includes automated exposure control, adjustment of the mA and/or kV according to patient size and/or use of iterative reconstruction technique. COMPARISON:  MRI lumbar spine 11/07/2021, x-ray lumbar spine 12/29/2021 FINDINGS: Segmentation: 5 lumbar type vertebrae. Alignment: Normal. Vertebrae: Chronic L2 compression fracture status post kyphoplasty. Interval development of an L1 compression fracture with at least mid 25% vertebral body height loss. Paraspinal and other soft tissues: Negative. Disc levels: Maintained. Other: 2 mm  calcified stone in the left kidney. Atherosclerotic plaque. IMPRESSION: 1. Interval development of an L1 compression fracture with at least mid 25% vertebral body height loss. 2. Chronic L2 compression fracture status post kyphoplasty. 3. Nonobstructive 2 mm left nephrolithiasis. 4.  Aortic Atherosclerosis (ICD10-I70.0). Electronically Signed   By: Iven Finn M.D.   On: 04/03/2022 18:40   CT Head Wo Contrast  Result Date: 04/03/2022 CLINICAL DATA:  Pt had a fall at home, pt c/o back and R elbow pain. States that he was walking and his walker folded up on him and he fell backwards, pt states he landed on his back and hit the back of head EXAM: CT HEAD WITHOUT CONTRAST CT CERVICAL SPINE WITHOUT CONTRAST TECHNIQUE: Multidetector CT imaging of the head and cervical spine was performed following the standard protocol without intravenous contrast. Multiplanar CT image reconstructions of the cervical spine were also generated. RADIATION DOSE REDUCTION: This exam was performed according to the  departmental dose-optimization program which includes automated exposure control, adjustment of the mA and/or kV according to patient size and/or use of iterative reconstruction technique. COMPARISON:  CT head and cervical spine 10/18/2020 FINDINGS: CT HEAD FINDINGS Brain: No evidence of large-territorial acute infarction. No parenchymal hemorrhage. No mass lesion. No extra-axial collection. No mass effect or midline shift. No hydrocephalus. Basilar cisterns are patent. Vascular: No hyperdense vessel. Skull: No acute fracture or focal lesion. Sinuses/Orbits: Paranasal sinuses and mastoid air cells are clear. The orbits are unremarkable. Other: None. CT CERVICAL SPINE FINDINGS Alignment: Normal. Skull base and vertebrae: C5-C6 intervertebral disc space narrowing, osteophyte formation, posterior disc osteophyte complex formation. No acute fracture. No aggressive appearing focal osseous lesion or focal pathologic process. Soft  tissues and spinal canal: No prevertebral fluid or swelling. No visible canal hematoma. Upper chest: Unremarkable. Other: None. IMPRESSION: 1. No acute intracranial abnormality. 2. No acute displaced fracture or traumatic listhesis of the cervical spine. Electronically Signed   By: Iven Finn M.D.   On: 04/03/2022 18:32   CT Cervical Spine Wo Contrast  Result Date: 04/03/2022 CLINICAL DATA:  Pt had a fall at home, pt c/o back and R elbow pain. States that he was walking and his walker folded up on him and he fell backwards, pt states he landed on his back and hit the back of head EXAM: CT HEAD WITHOUT CONTRAST CT CERVICAL SPINE WITHOUT CONTRAST TECHNIQUE: Multidetector CT imaging of the head and cervical spine was performed following the standard protocol without intravenous contrast. Multiplanar CT image reconstructions of the cervical spine were also generated. RADIATION DOSE REDUCTION: This exam was performed according to the departmental dose-optimization program which includes automated exposure control, adjustment of the mA and/or kV according to patient size and/or use of iterative reconstruction technique. COMPARISON:  CT head and cervical spine 10/18/2020 FINDINGS: CT HEAD FINDINGS Brain: No evidence of large-territorial acute infarction. No parenchymal hemorrhage. No mass lesion. No extra-axial collection. No mass effect or midline shift. No hydrocephalus. Basilar cisterns are patent. Vascular: No hyperdense vessel. Skull: No acute fracture or focal lesion. Sinuses/Orbits: Paranasal sinuses and mastoid air cells are clear. The orbits are unremarkable. Other: None. CT CERVICAL SPINE FINDINGS Alignment: Normal. Skull base and vertebrae: C5-C6 intervertebral disc space narrowing, osteophyte formation, posterior disc osteophyte complex formation. No acute fracture. No aggressive appearing focal osseous lesion or focal pathologic process. Soft tissues and spinal canal: No prevertebral fluid or swelling.  No visible canal hematoma. Upper chest: Unremarkable. Other: None. IMPRESSION: 1. No acute intracranial abnormality. 2. No acute displaced fracture or traumatic listhesis of the cervical spine. Electronically Signed   By: Iven Finn M.D.   On: 04/03/2022 18:32   DG Elbow 2 Views Right  Result Date: 04/03/2022 CLINICAL DATA:  Fall, right elbow pain EXAM: RIGHT ELBOW - 2 VIEW COMPARISON:  None Available. FINDINGS: There is no evidence of fracture, dislocation, or joint effusion. There is no evidence of arthropathy or other focal bone abnormality. Soft tissues are unremarkable. IMPRESSION: Negative. Electronically Signed   By: Davina Poke D.O.   On: 04/03/2022 18:13      IMPRESSION AND PLAN:  Assessment and Plan: * Closed compression fracture of body of L1 vertebra (HCC) - The patient will be admitted to a medical-surgical bed. - Pain management will be provided. - PT consult will be obtained. - Neurosurgery consult will be obtained. - I notified Dr. Tamala Julian about patient.  Dyslipidemia - We will continue statin therapy.  Asthma, chronic - We  will continue with Breo and albuterol Singulair.  ALS (amyotrophic lateral sclerosis) (HCC) - We will continue Rilutek and baclofen as well as Neurontin.   DVT prophylaxis: Lovenox.  Advanced Care Planning:  Code Status: The patient is DNR/DNI.  This was discussed with him.  Family Communication:  The plan of care was discussed in details with the patient (and family). I answered all questions. The patient agreed to proceed with the above mentioned plan. Further management will depend upon hospital course. Disposition Plan: Back to previous home environment Consults called: Neurosurgery. All the records are reviewed and case discussed with ED provider.  Status is: Inpatient  At the time of the admission, it appears that the appropriate admission status for this patient is inpatient.  This is judged to be reasonable and necessary in order  to provide the required intensity of service to ensure the patient's safety given the presenting symptoms, physical exam findings and initial radiographic and laboratory data in the context of comorbid conditions.  The patient requires inpatient status due to high intensity of service, high risk of further deterioration and high frequency of surveillance required.  I certify that at the time of admission, it is my clinical judgment that the patient will require inpatient hospital care extending more than 2 midnights.                            Dispo: The patient is from: Home              Anticipated d/c is to: Home              Patient currently is not medically stable to d/c.              Difficult to place patient: No  Hannah Beat M.D on 04/03/2022 at 10:34 PM  [facility]   From 7 PM-7 AM, contact night-coverage [email or web address]  CC: Primary care physician; [employer], [employer]

## 2022-04-03 NOTE — ED Triage Notes (Signed)
Pt via EMS from home. Pt had a fall at home, pt c/o back and R elbow pain. States that he was walking and his walker folded up on him and he fell backwards, pt states he landed on his back and hit the back of head. Denies LOC. Denies blood thinners. Pt also endorses bilateral leg numbness. Pt had back surgery 4 months ago. Pt is A&Ox4 and NAD  109/64 69 HR 96% on RA

## 2022-04-03 NOTE — Assessment & Plan Note (Signed)
-   We will continue Rilutek and baclofen as well as Neurontin.

## 2022-04-03 NOTE — ED Provider Triage Note (Signed)
Emergency Medicine Provider Triage Evaluation Note  Shawn [name] , a 61 y.o. male  was evaluated in triage.  Pt complains of back pain and right elbow pain after fall. History of ALS. Reports that his walker folded up and he lost his balance he fell backwards and landed on his back. +HS, no LOC. Reports pain in his legs and pain with moving his legs. No AC.  Patient Active Problem List   Diagnosis Date Noted   Right leg pain 11/13/2021   COVID-19 virus infection 11/13/2021   Acute hypoxemic respiratory failure due to COVID-19 [facility]) 11/13/2021   Acute respiratory failure with hypoxia (HCC) 11/12/2021   Compression fracture of L2 (HCC) 11/07/2021   Lumbago 11/07/2021   Pain in both lower legs 11/07/2021   ALS (amyotrophic lateral sclerosis) (HCC) 11/07/2021   Asthma, chronic 11/07/2021   Ureteral stone 05/20/2015     Review of Systems  Positive: Back pain Negative: Headache, abdominal pain  Physical Exam  There were no vitals taken for this visit. Gen:   Awake, uncomfortable appearing  Resp:  Normal effort  MSK:   Able to plantarflex/dorsiflex at ankle, sensation intact to light touch, but cannot lift legs up off the stretcher Other:    Medical Decision Making  Medically screening exam initiated at 5:36 PM.  Appropriate orders placed.  ABDULRAHEEM PINEO was informed that the remainder of the evaluation will be completed by another provider, this initial triage assessment does not replace that evaluation, and the importance of remaining in the ED until their evaluation is complete.    Jackelyn Hoehn, PA-C 04/03/22 1744

## 2022-04-03 NOTE — Assessment & Plan Note (Addendum)
-   will continue with Breo and albuterol Singulair.

## 2022-04-03 NOTE — ED Provider Notes (Signed)
[facility] Provider Note    Event Date/Time   First MD Initiated Contact with Patient 04/03/22 1916     (approximate)   History   Chief Complaint Fall and Back Pain   HPI Shawn [name] is a 61 y.o. male, history of ALS, asthma, hyperlipidemia, prior stroke, CAD, compression fracture of L2, presents the emergency department for evaluation of injury sustained from fall.  Patient states he was walking in his house with his walker when he lost his balance and fell backwards, hitting on his back and hitting his head..  Denies any particular symptoms prior to the fall.  When he hit the ground, he states that he may have blacked out, but is unsure.  In addition endorses some mild numbness in his lower extremities bilaterally.  In addition endorses right elbow pain, and right lower leg pain.  Patient states that he had surgery on his back approximately 4 months ago.  Denies fever/chills, chest pain, shortness breath, abdominal pain, dizziness/lightheadedness, bowel/bladder dysfunction, saddle anesthesia, rash/lesions, numbness or tingling in upper extremities, nausea/vomiting, diarrhea, or headache.  Denies any blood thinner use.  History Limitations: No limitations.        Physical Exam  Triage Vital Signs: ED Triage Vitals [04/03/22 1736]  Enc Vitals Group     BP 136/81     Pulse Rate 76     Resp 18     Temp 98.5 F (36.9 C)     Temp Source Oral     SpO2 97 %     Weight 104 lb (47.2 kg)     Height 5\' 10"  (1.778 m)     Head Circumference      Peak Flow      Pain Score 10     Pain Loc      Pain Edu?      Excl. in [place]?     Most recent vital signs: Vitals:   04/03/22 1952 04/03/22 2230  BP: 104/61 (!) 133/93  Pulse: 70 88  Resp: 16   Temp:  97.6 F (36.4 C)  SpO2: 95% 97%    General: Awake,  Skin: Warm, dry. No rashes or lesions.  Eyes: PERRL. Conjunctivae normal.  CV: Good peripheral perfusion.  Resp: Normal effort.  Abd: Soft,  non-tender. No distention.  Neuro: At baseline. No gross neurological deficits.   Focused Exam: Midline tenderness along the lumbar region.  Sensation intact in both lower extremities, though does have notable weakness and significant pain in his back with any movement of his lower extremities.  Unable to ambulate on his own without significant assistance. Physical Exam    ED Results / Procedures / Treatments  Labs (all labs ordered are listed, but only abnormal results are displayed) Labs Reviewed  COMPREHENSIVE METABOLIC PANEL - Abnormal; Notable for the following components:      Result Value   Glucose, Bld 123 (*)    All other components within normal limits  CBC WITH DIFFERENTIAL/PLATELET - Abnormal; Notable for the following components:   WBC 12.5 (*)    Neutro Abs 10.3 (*)    All other components within normal limits  BASIC METABOLIC PANEL  CBC     EKG Sinus rhythm, rate 69, low voltage, no T-segment changes, no AV blocks, no axis deviations,   RADIOLOGY  ED Provider Interpretation: I personally reviewed and interpreted these images.  CT lumbar does show L1 compression fracture.  Head CT shows no acute findings.  CT cervical spine  shows no acute findings.  DG elbow shows no acute findings.  DG Tibia/Fibula Right  Result Date: 04/03/2022 CLINICAL DATA:  Recent fall with right leg pain, initial encounter EXAM: RIGHT TIBIA AND FIBULA - 2 VIEW COMPARISON:  None Available. FINDINGS: No acute fracture or dislocation is noted. No soft tissue abnormality is seen. No joint effusion noted. Mild meniscal calcifications are seen. IMPRESSION: No acute bony abnormality noted. Electronically Signed   By: Alcide Clever M.D.   On: 04/03/2022 20:40   CT Lumbar Spine Wo Contrast  Result Date: 04/03/2022 CLINICAL DATA:  Back trauma, no prior imaging (Age >= 16y). Pt had a fall at home, pt c/o back and R elbow pain. States that he was walking and his walker folded up on him and he fell  backwards, pt states he landed on his back and hit the back of head. EXAM: CT LUMBAR SPINE WITHOUT CONTRAST TECHNIQUE: Multidetector CT imaging of the lumbar spine was performed without intravenous contrast administration. Multiplanar CT image reconstructions were also generated. RADIATION DOSE REDUCTION: This exam was performed according to the departmental dose-optimization program which includes automated exposure control, adjustment of the mA and/or kV according to patient size and/or use of iterative reconstruction technique. COMPARISON:  MRI lumbar spine 11/07/2021, x-ray lumbar spine 12/29/2021 FINDINGS: Segmentation: 5 lumbar type vertebrae. Alignment: Normal. Vertebrae: Chronic L2 compression fracture status post kyphoplasty. Interval development of an L1 compression fracture with at least mid 25% vertebral body height loss. Paraspinal and other soft tissues: Negative. Disc levels: Maintained. Other: 2 mm calcified stone in the left kidney. Atherosclerotic plaque. IMPRESSION: 1. Interval development of an L1 compression fracture with at least mid 25% vertebral body height loss. 2. Chronic L2 compression fracture status post kyphoplasty. 3. Nonobstructive 2 mm left nephrolithiasis. 4.  Aortic Atherosclerosis (ICD10-I70.0). Electronically Signed   By: Tish Frederickson M.D.   On: 04/03/2022 18:40   CT Head Wo Contrast  Result Date: 04/03/2022 CLINICAL DATA:  Pt had a fall at home, pt c/o back and R elbow pain. States that he was walking and his walker folded up on him and he fell backwards, pt states he landed on his back and hit the back of head EXAM: CT HEAD WITHOUT CONTRAST CT CERVICAL SPINE WITHOUT CONTRAST TECHNIQUE: Multidetector CT imaging of the head and cervical spine was performed following the standard protocol without intravenous contrast. Multiplanar CT image reconstructions of the cervical spine were also generated. RADIATION DOSE REDUCTION: This exam was performed according to the departmental  dose-optimization program which includes automated exposure control, adjustment of the mA and/or kV according to patient size and/or use of iterative reconstruction technique. COMPARISON:  CT head and cervical spine 10/18/2020 FINDINGS: CT HEAD FINDINGS Brain: No evidence of large-territorial acute infarction. No parenchymal hemorrhage. No mass lesion. No extra-axial collection. No mass effect or midline shift. No hydrocephalus. Basilar cisterns are patent. Vascular: No hyperdense vessel. Skull: No acute fracture or focal lesion. Sinuses/Orbits: Paranasal sinuses and mastoid air cells are clear. The orbits are unremarkable. Other: None. CT CERVICAL SPINE FINDINGS Alignment: Normal. Skull base and vertebrae: C5-C6 intervertebral disc space narrowing, osteophyte formation, posterior disc osteophyte complex formation. No acute fracture. No aggressive appearing focal osseous lesion or focal pathologic process. Soft tissues and spinal canal: No prevertebral fluid or swelling. No visible canal hematoma. Upper chest: Unremarkable. Other: None. IMPRESSION: 1. No acute intracranial abnormality. 2. No acute displaced fracture or traumatic listhesis of the cervical spine. Electronically Signed   By: Tish Frederickson  M.D.   On: 04/03/2022 18:32   CT Cervical Spine Wo Contrast  Result Date: 04/03/2022 CLINICAL DATA:  Pt had a fall at home, pt c/o back and R elbow pain. States that he was walking and his walker folded up on him and he fell backwards, pt states he landed on his back and hit the back of head EXAM: CT HEAD WITHOUT CONTRAST CT CERVICAL SPINE WITHOUT CONTRAST TECHNIQUE: Multidetector CT imaging of the head and cervical spine was performed following the standard protocol without intravenous contrast. Multiplanar CT image reconstructions of the cervical spine were also generated. RADIATION DOSE REDUCTION: This exam was performed according to the departmental dose-optimization program which includes automated exposure  control, adjustment of the mA and/or kV according to patient size and/or use of iterative reconstruction technique. COMPARISON:  CT head and cervical spine 10/18/2020 FINDINGS: CT HEAD FINDINGS Brain: No evidence of large-territorial acute infarction. No parenchymal hemorrhage. No mass lesion. No extra-axial collection. No mass effect or midline shift. No hydrocephalus. Basilar cisterns are patent. Vascular: No hyperdense vessel. Skull: No acute fracture or focal lesion. Sinuses/Orbits: Paranasal sinuses and mastoid air cells are clear. The orbits are unremarkable. Other: None. CT CERVICAL SPINE FINDINGS Alignment: Normal. Skull base and vertebrae: C5-C6 intervertebral disc space narrowing, osteophyte formation, posterior disc osteophyte complex formation. No acute fracture. No aggressive appearing focal osseous lesion or focal pathologic process. Soft tissues and spinal canal: No prevertebral fluid or swelling. No visible canal hematoma. Upper chest: Unremarkable. Other: None. IMPRESSION: 1. No acute intracranial abnormality. 2. No acute displaced fracture or traumatic listhesis of the cervical spine. Electronically Signed   By: Tish Frederickson M.D.   On: 04/03/2022 18:32   DG Elbow 2 Views Right  Result Date: 04/03/2022 CLINICAL DATA:  Fall, right elbow pain EXAM: RIGHT ELBOW - 2 VIEW COMPARISON:  None Available. FINDINGS: There is no evidence of fracture, dislocation, or joint effusion. There is no evidence of arthropathy or other focal bone abnormality. Soft tissues are unremarkable. IMPRESSION: Negative. Electronically Signed   By: Duanne Guess D.O.   On: 04/03/2022 18:13    PROCEDURES:  Critical Care performed: None.  Procedures    MEDICATIONS ORDERED IN ED: Medications  oxyCODONE (Oxy IR/ROXICODONE) immediate release tablet 5 mg (has no administration in time range)  atorvastatin (LIPITOR) tablet 20 mg (has no administration in time range)  traZODone (DESYREL) tablet 100 mg (100 mg Oral  Given 04/03/22 2219)  senna-docusate (Senokot-S) tablet 1 tablet (1 tablet Oral Given 04/03/22 2219)  baclofen (LIORESAL) tablet 10 mg (10 mg Oral Given 04/03/22 2218)  gabapentin (NEURONTIN) 250 MG/5ML solution 250 mg (has no administration in time range)  riluzole (RILUTEK) tablet 50 mg (has no administration in time range)  albuterol (PROVENTIL) (2.5 MG/3ML) 0.083% nebulizer solution 2.5 mg (has no administration in time range)  fluticasone furoate-vilanterol (BREO ELLIPTA) 100-25 MCG/ACT 1 puff (has no administration in time range)  montelukast (SINGULAIR) tablet 10 mg (10 mg Oral Given 04/03/22 2219)  0.9 %  sodium chloride infusion ( Intravenous New Bag/Given 04/03/22 2224)  acetaminophen (TYLENOL) tablet 650 mg (has no administration in time range)    Or  acetaminophen (TYLENOL) suppository 650 mg (has no administration in time range)  traZODone (DESYREL) tablet 25 mg (has no administration in time range)  magnesium hydroxide (MILK OF MAGNESIA) suspension 30 mL (has no administration in time range)  ondansetron (ZOFRAN) tablet 4 mg (has no administration in time range)    Or  ondansetron (ZOFRAN) injection 4  mg (has no administration in time range)  HYDROmorphone (DILAUDID) injection 0.5 mg (0.5 mg Intramuscular Given 04/03/22 1746)  fentaNYL (SUBLIMAZE) injection 50 mcg (50 mcg Intravenous Given 04/03/22 1948)  HYDROmorphone (DILAUDID) injection 1 mg (1 mg Intravenous Given 04/03/22 2049)  sodium chloride 0.9 % bolus 1,000 mL (1,000 mLs Intravenous New Bag/Given 04/03/22 2050)     IMPRESSION / MDM / ASSESSMENT AND PLAN / ED COURSE  I reviewed the triage vital signs and the nursing notes.                              Differential diagnosis includes, but is not limited to, compression fracture, vertebral fracture, olecranon fracture, tibia/fibula fracture, epidural/subdural hematoma, concussion, cervical spine injury  ED Course Patient appears clinically stable, though endorses significant pain  despite initial hydromorphone in triage.  We will go ahead treat with 50 mcg fentanyl IV.  CBC shows leukocytosis at 12.5.  No anemia present.  CMP shows no electrolyte abnormalities, AKI, or transaminitis.  Assessment/Plan Patient presents with interval development of L1 compression fracture with at least 25% vertebral body height loss secondary to mechanical fall that occurred earlier today.  Head/neck CTs showed no acute findings, though I suspect the patient did likely endure a concussion.  Right elbow and right tibia/fibula plain films also negative.  Patient has received multiple rounds of analgesia with minimal relief in his pain.  He is unable to ambulate on his own without significant assistance.  Pending message back from neurosurgery.  We will plan to admit for pain control.  Spoke with on-call hospitalist, Dr. Gayla Medicus, who agreed to admission.  Patient's presentation is most consistent with acute complicated illness / injury requiring diagnostic workup.       FINAL CLINICAL IMPRESSION(S) / ED DIAGNOSES   Final diagnoses:  Compression fracture of L1 vertebra, initial encounter ([place])     Rx / DC Orders   ED Discharge Orders     None        Note:  This document was prepared using Dragon voice recognition software and may include unintentional dictation errors.   Teodoro Spray, [place] 04/03/22 2324    Harvest Dark, MD 04/03/22 2337

## 2022-04-03 NOTE — Assessment & Plan Note (Addendum)
-   Pain meds po prn - PT consulted - Neurosurgery consult with Dr Katrinka Blazing. Continue conservative management. --MRI lumbar spine ordered --cont baclofen and gabapentin

## 2022-04-04 ENCOUNTER — Inpatient Hospital Stay: Payer: Medicare Other

## 2022-04-04 DIAGNOSIS — S32010A Wedge compression fracture of first lumbar vertebra, initial encounter for closed fracture: Secondary | ICD-10-CM | POA: Diagnosis not present

## 2022-04-04 LAB — BASIC METABOLIC PANEL
Anion gap: 3 — ABNORMAL LOW (ref 5–15)
BUN: 13 mg/dL (ref 8–23)
CO2: 23 mmol/L (ref 22–32)
Calcium: 8.5 mg/dL — ABNORMAL LOW (ref 8.9–10.3)
Chloride: 112 mmol/L — ABNORMAL HIGH (ref 98–111)
Creatinine, Ser: 0.5 mg/dL — ABNORMAL LOW (ref 0.61–1.24)
GFR, Estimated: >60 mL/min (ref >60)
Glucose, Bld: 99 mg/dL (ref 70–99)
Potassium: 3.8 mmol/L (ref 3.5–5.1)
Sodium: 138 mmol/L (ref 135–145)

## 2022-04-04 LAB — CBC
HCT: 35.9 % — ABNORMAL LOW (ref 39.0–52.0)
Hemoglobin: 12.1 g/dL — ABNORMAL LOW (ref 13.0–17.0)
MCH: 33.2 pg (ref 26.0–34.0)
MCHC: 33.7 g/dL (ref 30.0–36.0)
MCV: 98.4 fL (ref 80.0–100.0)
Platelets: 194 10*3/uL (ref 150–400)
RBC: 3.65 MIL/uL — ABNORMAL LOW (ref 4.22–5.81)
RDW: 12.5 % (ref 11.5–15.5)
WBC: 7.2 10*3/uL (ref 4.0–10.5)
nRBC: 0 % (ref 0.0–0.2)

## 2022-04-04 MED ORDER — GABAPENTIN 300 MG PO CAPS
300.0000 mg | ORAL_CAPSULE | Freq: Three times a day (TID) | ORAL | Status: DC
  Administered 2022-04-04: 300 mg via ORAL
  Filled 2022-04-04 (×3): qty 1

## 2022-04-04 MED ORDER — ACETAMINOPHEN 160 MG/5ML PO SOLN: 650.0000 mg | Freq: Four times a day (QID) | ORAL | Status: DC | PRN

## 2022-04-04 NOTE — Plan of Care (Signed)

## 2022-04-04 NOTE — Progress Notes (Signed)
  Progress Note   Patient: Shawn [name] PPI:[id number] DOB: March 31, 1961 DOA: 04/03/2022     1 DOS: the patient was seen and examined on 04/04/2022   Brief hospital course: Shawn [name] is a 61 y.o. Caucasian male with medical history significant for asthma, coronary artery disease, CVA, urolithiasis and ALS, who presents to the emergency room with a fall  and subsequent low back pain and subjective bilateral lower extremity numbness worsening weakness  Assessment and Plan: * Closed compression fracture of body of L1 vertebra (HCC) - Pain meds po prn - PT consulted - Neurosurgery consult with Dr Katrinka Blazing. Continue conservative management. --MRI lumbar spine ordered --cont baclofen and gabapentin  Dyslipidemia -continue statin therapy.  Asthma, chronic - will continue with Breo and albuterol Singulair.  ALS (amyotrophic lateral sclerosis) (HCC) -  will continue Rilutek and baclofen as well as Neurontin. --pt follows ALS clinic at [facility]        Subjective: back pain  Physical Exam: Vitals:   04/03/22 2335 04/04/22 0516 04/04/22 0840 04/04/22 1235  BP: 118/74 104/60 107/70 98/67  Pulse: 77 74 76 76  Resp: 18 16 15 15   Temp: 97.6 F (36.4 C) 97.9 F (36.6 C) 98.2 F (36.8 C) 98.4 F (36.9 C)  TempSrc: Oral     SpO2: 97% 97% 96% 96%  Weight:      Height:      GENERAL:  61 y.o.-year-old Caucasian male patient lying in the bed with no acute distress.  LUNGS: Normal breath sounds bilaterally, no wheezing, CARDIOVASCULAR: Regular rate and rhythm, S1, S2 normal. No murmurs ABDOMEN: Soft, nondistended, nontender. Bowel sounds present.  EXTREMITIES: No pedal edema NEUROLOGIC:grossly non focal, bilateral 4/5 weakness LE  Family Communication: none today  Disposition: Status is: Inpatient Remains inpatient appropriate because: fall, pending MRI, PT/OT eval  Planned Discharge Destination:  TBD    Time spent: 35 minutes  Author: 66, MD 04/04/2022 12:45 PM  For  on call review [email or web address].

## 2022-04-05 DIAGNOSIS — S32010A Wedge compression fracture of first lumbar vertebra, initial encounter for closed fracture: Secondary | ICD-10-CM | POA: Diagnosis not present

## 2022-04-05 MED ORDER — OXYCODONE HCL 5 MG/5ML PO SOLN
10.0000 mg | Freq: Four times a day (QID) | ORAL | 0 refills | Status: AC | PRN
Start: 2022-04-05 — End: 2022-04-10

## 2022-04-05 MED ORDER — OXYCODONE HCL 5 MG/5ML PO SOLN
5.0000 mg | ORAL | Status: DC | PRN
  Administered 2022-04-05: 5 mg via ORAL
  Filled 2022-04-05 (×2): qty 5

## 2022-04-05 NOTE — TOC Transition Note (Signed)
Transition of Care [facility]) - CM/SW Discharge Note   Patient Details  Name: Shawn [name] MRN: [id number] Date of Birth: Dec 16, 1960  [facility] CM/SW Contact:  Gildardo Griffes, LCSW Phone Number: 04/05/2022, 3:04 PM   Clinical Narrative:     Patient to dc home today, patient agreeable to [facility] PT and OT with [facility].   Patient required taxi as unable to afford other transport, patient also has his mother who is unable to pick up.   CSW has called [employer] taxi, patient confirmed address in chart is accurate.   Patient is medically cleared to dc, RN, Water quality scientist and security aware of patient's dc today and to be wheeled down to front of [facility] main entrance, taxi reports they are en route.   No further discharge needs identified.    Final next level of care: Home w Home Health Services Barriers to Discharge: No Barriers Identified   Patient Goals and CMS Choice Patient states their goals for this hospitalization and ongoing recovery are:: to go home CMS Medicare.gov Compare Post Acute Care list provided to:: Patient Choice offered to / list presented to : Patient  Discharge Placement                Patient to be transferred to facility by: taxi   Patient and family notified of of transfer: 04/05/22  Discharge Plan and Services                          HH Arranged: PT, OT [id number] [facility] Agency: [facility] Date HH Agency Contacted: 04/05/22 Time HH Agency Contacted: [id number] Representative spoke with at [facility] Agency: Barbara Cower  Social Determinants of Health (SDOH) Interventions     Readmission Risk Interventions     No data to display

## 2022-04-05 NOTE — Progress Notes (Addendum)
  Transition of Care [facility]) Screening Note   Patient Details  Name: HAKEEM FRAZZINI Date of Birth: [birth year]   Transition of Care [facility]) CM/SW Contact:    Gildardo Griffes, LCSW Phone Number: 04/05/2022, 12:23 PM  Patient to discharge today, no identified needs at this time. PCP is [facility]. On room air. No wounds.   Transition of Care Department [facility] has reviewed patient and no TOC needs have been identified at this time. We will continue to monitor patient advancement through interdisciplinary progression rounds. If new patient transition needs arise, please place a TOC consult.  [place], [place] [phone]

## 2022-04-05 NOTE — Progress Notes (Signed)
Walked into pt room with medication pt was verbally aggressive cussing and screaming at me, saying that he needed to go home I explained to pt that the doctor placed an order for a brace to be placed while pt is OOB, pt tried to get out of bed I called charge nurse Audria Nine since pt was cussing and trying to get out of bed I placed my hand on pt legs to keep him from getting out of bed Audria Nine present at bedside the whole time, security called and pt still becoming verbally aggressive, and threatening to call 911 on both me and Marchelle Folks for accosting him. Md came to bedside to place orders for brace and pt will be d/c today.

## 2022-04-05 NOTE — Plan of Care (Signed)
  Problem: Education: Goal: Knowledge of General Education information will improve Description: Including pain rating scale, medication(s)/side effects and non-pharmacologic comfort measures Outcome: Progressing   Problem: Clinical Measurements: Goal: Will remain free from infection Outcome: Progressing   Problem: Health Behavior/Discharge Planning: Goal: Ability to manage health-related needs will improve Outcome: Progressing   Problem: Nutrition: Goal: Adequate nutrition will be maintained Outcome: Progressing   Problem: Coping: Goal: Level of anxiety will decrease Outcome: Progressing   Problem: Elimination: Goal: Will not experience complications related to bowel motility Outcome: Progressing   Problem: Elimination: Goal: Will not experience complications related to urinary retention Outcome: Progressing   Problem: Pain Managment: Goal: General experience of comfort will improve Outcome: Progressing   Problem: Safety: Goal: Ability to remain free from injury will improve Outcome: Progressing   Problem: Skin Integrity: Goal: Risk for impaired skin integrity will decrease Outcome: Progressing

## 2022-04-05 NOTE — Progress Notes (Signed)
Orthopedic Tech Progress Note Patient Details:  Shawn [name] 03/13/1961 [id number]  Called in order to [facility] for a LSO BACK BRACE   Patient ID: Shawn [name], male   DOB: Aug 03, 1961, 61 y.o.   MRN: [id number]  [name] 04/05/2022, 10:00 AM

## 2022-04-05 NOTE — Progress Notes (Signed)
Leader round completed on patient to address complaint about the time it's taking for his pain medicine. I explained to the patient that the order had been changed from pill to liquid form and the medication had to be prepared by the Pharmacy. Once prepared, the nurse would go down to get it. He asked if he could bring his medication from home to take. I told him that's against our policy for safety reasons and that the delay he experienced today was because it was a new order.

## 2022-04-05 NOTE — Consult Note (Signed)
Neurosurgery-New Consultation Evaluation 04/05/2022 [name] [id number]  Identifying Statement: Shawn [name] is a 61 y.o. male from [facility] [place] [address] with a history of ALS, CAD, HLD presenting with a fall. Manning Charity as previously seen the patient for the same issue.  Physician Requesting Consultation: No ref. provider found  History of Present Illness: Shawn [name] is a 61 year old male presenting after a mechanical fall.  He has difficulty walking at baseline due to amyotrophic lateral sclerosis.  He had onset of back pain and presented to the emergency department.  He has no change in his baseline weakness.     Past Medical History:  Past Medical History:  Diagnosis Date   ALS (amyotrophic lateral sclerosis) (HCC)    Asthma    Coronary artery disease    Hyperlipidemia    Renal stone    Stroke [facility])     Social History: Social History   Socioeconomic History   Marital status: Single    Spouse name: Not on file   Number of children: Not on file   Years of education: Not on file   Highest education level: Not on file  Occupational History   Not on file  Tobacco Use   Smoking status: Never   Smokeless tobacco: Never  Vaping Use   Vaping Use: Never used  Substance and Sexual Activity   Alcohol use: Not Currently    Alcohol/week: 0.0 standard drinks of alcohol    Comment: occasionally   Drug use: Never   Sexual activity: Not on file  Other Topics Concern   Not on file  Social History Narrative   Not on file   Social Determinants of Health   Financial Resource Strain: Not on file  Food Insecurity: Not on file  Transportation Needs: Not on file  Physical Activity: Not on file  Stress: Not on file  Social Connections: Not on file  Intimate Partner Violence: Not on file    Family History: Family History  Problem Relation Age of Onset   Cancer Mother    Heart failure Mother    Hypertension Mother    CVA Mother    Diabetes Other    Other  Father        unknown medical history    Review of Systems:  Review of Systems - General ROS: Negative Psychological ROS: Negative Ophthalmic ROS: Negative ENT ROS: Negative Hematological and Lymphatic ROS: Negative  Endocrine ROS: Negative Respiratory ROS: Negative Cardiovascular ROS: Negative Gastrointestinal ROS: Negative Genito-Urinary ROS: Negative Musculoskeletal ROS: Negative Neurological ROS: Negative Dermatological ROS: Negative  Physical Exam: BP 119/75 (BP Location: Left Arm)   Pulse 69   Temp 98.3 F (36.8 C)   Resp 16   Ht 5\' 10"  (1.778 m)   Wt 47.2 kg   SpO2 94%   BMI 14.92 kg/m  Body mass index is 14.92 kg/m. Body surface area is 1.53 meters squared.  General appearance: Alert, cooperative, in mild distress 2/2 to pain  Head: Normocephalic, atraumatic Eyes: Normal, EOM intact Oropharynx: Moist without lesions Neck: Supple, no tenderness Lungs: no obvious SOB Abdomen: Soft, nondistended Ext: No edema in LE bilaterally, good distal pulses  Neurologic exam:  Mental status: alertness: alert, orientation: person, place, time, affect: normal Speech: Somewhat breathy, dysarthric and slowed.  Cranial nerves:  CN II-XII grossly intact  Motor: 5/5 strength in BUE. 3/5 bilateral HP and KE. 4+/5 bilateral DF and EHL, 3/5 left PF 4-/5 right PF. Sensory: Sensation to touch throughout bilateral lower extremities.  Intact sensation throughout thorax and upper extremities. Reflexes: 3+ and symmetric bilaterally for arms and legs Coordination: no deficit noted Gait: untested  Laboratory: Results for orders placed or performed during the hospital encounter of 04/03/22  Comprehensive metabolic panel  Result Value Ref Range   Sodium 140 135 - 145 mmol/L   Potassium 4.2 3.5 - 5.1 mmol/L   Chloride 109 98 - 111 mmol/L   CO2 23 22 - 32 mmol/L   Glucose, Bld 123 (H) 70 - 99 mg/dL   BUN 15 8 - 23 mg/dL   Creatinine, Ser 5.09 0.61 - 1.24 mg/dL   Calcium 9.3 8.9 -  32.6 mg/dL   Total Protein 6.9 6.5 - 8.1 g/dL   Albumin 4.4 3.5 - 5.0 g/dL   AST 28 15 - 41 U/L   ALT 33 0 - 44 U/L   Alkaline Phosphatase 101 38 - 126 U/L   Total Bilirubin 1.1 0.3 - 1.2 mg/dL   GFR, Estimated >71 >24 mL/min   Anion gap 8 5 - 15  CBC with Differential  Result Value Ref Range   WBC 12.5 (H) 4.0 - 10.5 K/uL   RBC 4.23 4.22 - 5.81 MIL/uL   Hemoglobin 14.0 13.0 - 17.0 g/dL   HCT 58.0 99.8 - 33.8 %   MCV 100.0 80.0 - 100.0 fL   MCH 33.1 26.0 - 34.0 pg   MCHC 33.1 30.0 - 36.0 g/dL   RDW 25.0 53.9 - 76.7 %   Platelets 202 150 - 400 K/uL   nRBC 0.0 0.0 - 0.2 %   Neutrophils Relative % 83 %   Neutro Abs 10.3 (H) 1.7 - 7.7 K/uL   Lymphocytes Relative 11 %   Lymphs Abs 1.4 0.7 - 4.0 K/uL   Monocytes Relative 6 %   Monocytes Absolute 0.8 0.1 - 1.0 K/uL   Eosinophils Relative 0 %   Eosinophils Absolute 0.0 0.0 - 0.5 K/uL   Basophils Relative 0 %   Basophils Absolute 0.0 0.0 - 0.1 K/uL   Immature Granulocytes 0 %   Abs Immature Granulocytes 0.05 0.00 - 0.07 K/uL  Basic metabolic panel  Result Value Ref Range   Sodium 138 135 - 145 mmol/L   Potassium 3.8 3.5 - 5.1 mmol/L   Chloride 112 (H) 98 - 111 mmol/L   CO2 23 22 - 32 mmol/L   Glucose, Bld 99 70 - 99 mg/dL   BUN 13 8 - 23 mg/dL   Creatinine, Ser 3.41 (L) 0.61 - 1.24 mg/dL   Calcium 8.5 (L) 8.9 - 10.3 mg/dL   GFR, Estimated >93 >79 mL/min   Anion gap 3 (L) 5 - 15  CBC  Result Value Ref Range   WBC 7.2 4.0 - 10.5 K/uL   RBC 3.65 (L) 4.22 - 5.81 MIL/uL   Hemoglobin 12.1 (L) 13.0 - 17.0 g/dL   HCT 02.4 (L) 09.7 - 35.3 %   MCV 98.4 80.0 - 100.0 fL   MCH 33.2 26.0 - 34.0 pg   MCHC 33.7 30.0 - 36.0 g/dL   RDW 29.9 24.2 - 68.3 %   Platelets 194 150 - 400 K/uL   nRBC 0.0 0.0 - 0.2 %   I personally reviewed labs  Imaging:  MRI T spine 11/07/21 IMPRESSION: Unremarkable pre and post-contrast MRI of the thoracic spine.     Electronically Signed   By: Duanne Guess D.O.   On: 11/07/2021 12:40  MRI L  spine 11/07/21 IMPRESSION: 1. Acute mild superior endplate compression  fracture of L2 with approximately 10% vertebral body height loss. No retropulsion. 2. Mild lower lumbar spondylosis with borderline-mild left foraminal stenosis at L4-5. No canal stenosis at any level.     Electronically Signed   By: Duanne Guess D.O.   On: 11/07/2021 12:43   MRI L spine 04/04/22 IMPRESSION: 1. Acute to subacute L1 compression fracture with 25% loss of vertebral body height. Minimal retropulsion, and no spinal stenosis or other complicating features.   2. Satisfactory augmented L2 compression fracture. Mild for age lumbar spine degeneration.     Electronically Signed   By: Odessa Fleming M.D.   On: 04/04/2022 12:43  I personally reviewed radiology studies to include:   Impression/Plan:     1.  Diagnosis: L1 compression fracture and intractable pain.  2.  Plan - LSO brace for when the patient is ambulating out of bed.  - Please call with any questions or concerns.   Venetia Night MD Neurosurgery

## 2022-04-05 NOTE — Evaluation (Signed)
Occupational Therapy Evaluation Patient Details Name: Shawn [name] MRN: [id number] DOB: [birth year] Today's Date: 04/05/2022   History of Present Illness 62 y.o. male  with a history of ALS, CAD, HLD presenting with a fall and L1 closed compression fx to be managed conservatively.   Clinical Impression   Patient presenting with decreased Ind in self care,balance, functional mobility/transfers, endurance, and safety awareness. Patient reports living at home alone and ambulating with use of RW at baseline. He does endorse driving. Pt reports being mod I at baseline for self care tasks. He has a PCA that comes 3x/wk for 2.5 hours to assist with IADLs. His sister brings groceries and medication is delivered through a service. Pt is familiar with LSO brace and dons without assistance this session as well as pull over shirt. Pt is able to donn/doff socks but needs assistance with tying shoe laces but reports he has slip on shoes at home. Pt has a shuffling gait with RW and reports this is his baseline. No LOB with 90' ambulation into hallway.  Patient will benefit from acute OT to increase overall independence in the areas of ADLs, functional mobility, and safety awareness in order to safely discharge home.      Recommendations for follow up therapy are one component of a multi-disciplinary discharge planning process, led by the attending physician.  Recommendations may be updated based on patient status, additional functional criteria and insurance authorization.   Follow Up Recommendations  Home health OT    Assistance Recommended at Discharge Intermittent Supervision/Assistance  Patient can return home with the following A little help with bathing/dressing/bathroom;Assist for transportation;Assistance with cooking/housework    Functional Status Assessment  Patient has had a recent decline in their functional status and demonstrates the ability to make significant improvements in function in a  reasonable and predictable amount of time.  Equipment Recommendations  None recommended by OT       Precautions / Restrictions Precautions Precautions: Back Required Braces or Orthoses: Spinal Brace Spinal Brace: Lumbar corset;Applied in sitting position Spinal Brace Comments: LSO Restrictions Weight Bearing Restrictions: No      Mobility Bed Mobility Overal bed mobility: Modified Independent             General bed mobility comments: no physical assistance from flat bed with increased time to complete task    Transfers Overall transfer level: Needs assistance Equipment used: Rolling walker (2 wheels) Transfers: Sit to/from Stand Sit to Stand: Supervision           General transfer comment: no physical assistance provided      Balance Overall balance assessment: History of Falls, Needs assistance Sitting-balance support: Feet supported Sitting balance-Leahy Scale: Good     Standing balance support: Reliant on assistive device for balance, During functional activity Standing balance-Leahy Scale: Fair                             ADL either performed or assessed with clinical judgement   ADL Overall ADL's : Needs assistance/impaired                                       General ADL Comments: Pt dons pull over shirt an LSO brace himself while seated. Pt already has pants donned. Pt doffing socks and donning socks independently but needing assistance with shoes. He reports having slip on  shoes at home.     Vision Patient Visual Report: No change from baseline              Pertinent Vitals/Pain Pain Assessment Pain Assessment: Faces Faces Pain Scale: Hurts a little bit Pain Location: back Pain Descriptors / Indicators: Discomfort Pain Intervention(s): Monitored during session, Premedicated before session, Repositioned, Other (comment) (brace donned)     Hand Dominance Right   Extremity/Trunk Assessment Upper Extremity  Assessment Upper Extremity Assessment: Generalized weakness;Overall [facility] for tasks assessed   Lower Extremity Assessment Lower Extremity Assessment: Overall WFL for tasks assessed;Generalized weakness       Communication Communication Communication: Other (comment) (soft spoken)   Cognition Arousal/Alertness: Awake/alert Behavior During Therapy: WFL for tasks assessed/performed Overall Cognitive Status: Within Functional Limits for tasks assessed                                                  Home Living Family/patient expects to be discharged to:: Private residence Living Arrangements: Alone Available Help at Discharge: Family (sister and mother assist him as needed) Type of Home: Apartment Home Access: Ramped entrance     Home Layout: One level     Bathroom Shower/Tub: Chief Strategy Officer: Standard     Home Equipment: Agricultural consultant (2 wheels);Grab bars - tub/shower          Prior Functioning/Environment Prior Level of Function : Independent/Modified Independent;Driving             Mobility Comments: Mod Ind with amb community distances with a RW. He reports still driving. ADLs Comments: Pt reports being ind with ADLs, sister drops off groceries and he has medications delivered.  He in mostly at home unless he and mother are visiting.        OT Problem List: Decreased strength;Decreased activity tolerance;Impaired balance (sitting and/or standing);Decreased safety awareness      OT Treatment/Interventions: Self-care/ADL training;Balance training;Therapeutic exercise;Therapeutic activities;Energy conservation;DME and/or AE instruction;Patient/family education    OT Goals(Current goals can be found in the care plan section) Acute Rehab OT Goals Patient Stated Goal: to go home OT Goal Formulation: With patient Time For Goal Achievement: 04/19/22 Potential to Achieve Goals: Good ADL Goals Pt Will Perform Grooming: with  modified independence;standing Pt Will Perform Lower Body Dressing: with modified independence;sit to/from stand Pt Will Transfer to Toilet: with modified independence;ambulating Pt Will Perform Toileting - Clothing Manipulation and hygiene: with modified independence;sit to/from stand  OT Frequency: Min 2X/week       AM-PAC OT "6 Clicks" Daily Activity     Outcome Measure Help from another person eating meals?: None Help from another person taking care of personal grooming?: None Help from another person toileting, which includes using toliet, bedpan, or urinal?: A Little Help from another person bathing (including washing, rinsing, drying)?: A Little Help from another person to put on and taking off regular upper body clothing?: None Help from another person to put on and taking off regular lower body clothing?: A Little 6 Click Score: 21   End of Session Equipment Utilized During Treatment: Rolling walker (2 wheels) Nurse Communication: Mobility status  Activity Tolerance: Patient tolerated treatment well Patient left: in bed;with call bell/phone within reach  OT Visit Diagnosis: Unsteadiness on feet (R26.81);Repeated falls (R29.6);Muscle weakness (generalized) (M62.81);History of falling (Z91.81)  Time: 1327-1401 OT Time Calculation (min): 34 min Charges:  OT General Charges $OT Visit: 1 Visit OT Evaluation $OT Eval Moderate Complexity: 1 Mod OT Treatments $Self Care/Home Management : 23-37 mins  Jackquline Denmark, MS, OTR/L , CBIS ascom [phone]  04/05/22, 2:58 PM

## 2022-04-05 NOTE — Progress Notes (Signed)
Pt became aggressive again, yelling, screaming and cussing, pt called 911 wanting them to arrest me I had to talk to the dispatch and they sent an officer out, I spoke with the officer and told him what happened Audria Nine, RN also spoke with the officer, officer went back in there and explained that we were doing our job for the best interest of his care. Pt still verbally aggressive towards officer and asking officer "if I could smack the shit out of her  would I be arrestedScientist, research (medical) states that he could be arrested. I updated Charlyne Quale outside of the room.

## 2022-04-05 NOTE — Progress Notes (Signed)
Patient yelling, screaming, cussing. I entered room and asked what is going on. Patient is upset and wants to leave, patient yelling that he is going to press charges. Patient attempted to get out of bed unstable, Kaleigh LPN stood at bedside and assisted patient to side of bed. Patient yelling at nurse stating "get your hands off me." I explained to patient there is no need to yell and that nurse Lisabeth Pick is trying to keep him safe from falling, and that he also has a back fracture that needs a brace in place before getting up. MD, Security, Darien Ramus RN, and Systems analyst at bedside.

## 2022-04-05 NOTE — Discharge Summary (Signed)
Physician Discharge Summary   Shawn [name]  male DOB: Jan 30, 1961  ZJQ:[id number]  PCP: [facility]  Admit date: 04/03/2022 Discharge date: 04/05/2022  Admitted From: home Disposition:  home Home Health: Yes CODE STATUS: DNR   Hospital Course:  For full details, please see H&P, progress notes, consult notes and ancillary notes.  Briefly,  Shawn [name] is a 61 y.o. Caucasian male with medical history significant for asthma, coronary artery disease, CVA, and ALS, who presented to the emergency room with a fall and subsequent low back pain and subjective bilateral lower extremity numbness and worsening weakness.   * Closed compression fracture of body of L1 vertebra [facility]) --Neurosurgery consulted and ordered LSO brace for when the patient is ambulating out of bed.  --liquid oxycodone for 5 days prescribed at discharge. --OT ambulated pt with brace on and pt was cleared for discharge home with [facility] PT/OT.   Dyslipidemia -continue statin therapy.   Asthma, chronic - continue with Breo, albuterol, Singulair.   ALS (amyotrophic lateral sclerosis) (HCC) --pt follows ALS clinic at [facility] - continue home Rilutek and baclofen as well as Neurontin.   Unless noted above, medications under "STOP" list are ones pt was not taking PTA.  Discharge Diagnoses:  Principal Problem:   Closed compression fracture of body of L1 vertebra (HCC) Active Problems:   ALS (amyotrophic lateral sclerosis) (HCC)   Asthma, chronic   Dyslipidemia   30 Day Unplanned Readmission Risk Score    Flowsheet Row ED to Hosp-Admission (Current) from 04/03/2022 in [facility] (1A)  30 Day Unplanned Readmission Risk Score (%) 16.15 Filed at 04/05/2022 0801       This score is the patient's risk of an unplanned readmission within 30 days of being discharged (0 -100%). The score is based on dignosis, age, lab data, medications, orders, and past utilization.    Low:  0-14.9   Medium: 15-21.9   High: 22-29.9   Extreme: 30 and above         Discharge Instructions:  Allergies as of 04/05/2022       Reactions   Aspirin Other (See Comments), Itching, Shortness Of Breath, Swelling   Upset stomach  GI Upset   Compazine [prochlorperazine] Other (See Comments)   Added per pt. Pt had tachycardia chest discomfort   Morphine And Related Itching   Naproxen Other (See Comments)   Upset stomach  GI Upset   Penicillins Rash   Tramadol Itching        Medication List     STOP taking these medications    senna-docusate 8.6-50 MG tablet Commonly known as: Senokot-S       TAKE these medications    acetaminophen 160 MG/5ML solution Commonly known as: TYLENOL Take 480 mg by mouth every 6 (six) hours as needed for mild pain or moderate pain.   albuterol 108 (90 Base) MCG/ACT inhaler Commonly known as: VENTOLIN HFA Inhale 1-2 puffs into the lungs every 6 (six) hours as needed for wheezing or shortness of breath.   atorvastatin 20 MG tablet Commonly known as: LIPITOR Take 20 mg by mouth daily.   Baclofen 5 MG Tabs Take 10 mg by mouth 3 (three) times daily.   fluticasone furoate-vilanterol 100-25 MCG/ACT Aepb Commonly known as: BREO ELLIPTA Inhale 1 puff into the lungs in the morning.   gabapentin 300 MG capsule Commonly known as: NEURONTIN Take 300 mg by mouth 3 (three) times daily.   montelukast 10 MG tablet  Commonly known as: SINGULAIR Take 10 mg by mouth at bedtime.   oxyCODONE 5 MG/5ML solution Commonly known as: ROXICODONE Take 10 mLs (10 mg total) by mouth every 6 (six) hours as needed for up to 5 days for severe pain or moderate pain. What changed:  how much to take reasons to take this Another medication with the same name was removed. Continue taking this medication, and follow the directions you see here.   riluzole 50 MG tablet Commonly known as: RILUTEK Take 50 mg by mouth 2 (two) times daily.   traZODone 100  MG tablet Commonly known as: DESYREL Take 100 mg by mouth at bedtime.         [place] Follow up in 1 week(s).   Contact information: [place] [address] [phone]         Meade Maw, MD Follow up.   Specialty: Neurosurgery Why: As needed Contact information: [address] [place] [address] [phone]                 Allergies  Allergen Reactions   Aspirin Other (See Comments), Itching, Shortness Of Breath and Swelling    Upset stomach  GI Upset   Compazine [Prochlorperazine] Other (See Comments)    Added per pt. Pt had tachycardia chest discomfort   Morphine And Related Itching   Naproxen Other (See Comments)    Upset stomach  GI Upset   Penicillins Rash   Tramadol Itching     The results of significant diagnostics from this hospitalization (including imaging, microbiology, ancillary and laboratory) are listed below for reference.   Consultations:   Procedures/Studies: MR LUMBAR SPINE WO CONTRAST  Result Date: 04/04/2022 CLINICAL DATA:  61 year old male status post fall with low back pain. History of augmented L2 compression fracture. EXAM: MRI LUMBAR SPINE WITHOUT CONTRAST TECHNIQUE: Multiplanar, multisequence MR imaging of the lumbar spine was performed. No intravenous contrast was administered. COMPARISON:  Lumbar MRI 11/07/2021.  Lumbar spine CT yesterday. FINDINGS: Segmentation: Normal by CT, the same numbering system used on the previous MRI. Alignment: Lumbar lordosis has not significantly changed since February. Vertebrae: Augmented L2 compression fracture with no residual marrow edema at that level. L1 compression fracture with mostly superior endplate deformity and approximately 25% loss of vertebral body height associated with moderate marrow edema. Edema mildly tracks into both pedicles, but otherwise the L1 posterior elements appear intact. Minimal  retropulsion of bone (series 8, image 7). No other marrow edema or evidence of acute osseous abnormality. Background bone marrow signal within normal limits. Intact visible sacrum. Conus medullaris and cauda equina: Conus extends to the T12-L1 level. No lower spinal cord or conus signal abnormality. Paraspinal and other soft tissues: Mild if any paraspinal edema or hematoma at L1. Otherwise negative. Disc levels: Unchanged mild for age under lying lumbar spine degeneration. No significant spinal stenosis. No T12 or L1 foraminal stenosis. IMPRESSION: 1. Acute to subacute L1 compression fracture with 25% loss of vertebral body height. Minimal retropulsion, and no spinal stenosis or other complicating features. 2. Satisfactory augmented L2 compression fracture. Mild for age lumbar spine degeneration. Electronically Signed   By: Genevie Ann M.D.   On: 04/04/2022 12:43   DG Tibia/Fibula Right  Result Date: 04/03/2022 CLINICAL DATA:  Recent fall with right leg pain, initial encounter EXAM: RIGHT TIBIA AND FIBULA - 2 VIEW COMPARISON:  None Available. FINDINGS: No acute fracture or dislocation is noted.  No soft tissue abnormality is seen. No joint effusion noted. Mild meniscal calcifications are seen. IMPRESSION: No acute bony abnormality noted. Electronically Signed   By: Inez Catalina M.D.   On: 04/03/2022 20:40   CT Lumbar Spine Wo Contrast  Result Date: 04/03/2022 CLINICAL DATA:  Back trauma, no prior imaging (Age >= 16y). Pt had a fall at home, pt c/o back and R elbow pain. States that he was walking and his walker folded up on him and he fell backwards, pt states he landed on his back and hit the back of head. EXAM: CT LUMBAR SPINE WITHOUT CONTRAST TECHNIQUE: Multidetector CT imaging of the lumbar spine was performed without intravenous contrast administration. Multiplanar CT image reconstructions were also generated. RADIATION DOSE REDUCTION: This exam was performed according to the departmental dose-optimization  program which includes automated exposure control, adjustment of the mA and/or kV according to patient size and/or use of iterative reconstruction technique. COMPARISON:  MRI lumbar spine 11/07/2021, x-ray lumbar spine 12/29/2021 FINDINGS: Segmentation: 5 lumbar type vertebrae. Alignment: Normal. Vertebrae: Chronic L2 compression fracture status post kyphoplasty. Interval development of an L1 compression fracture with at least mid 25% vertebral body height loss. Paraspinal and other soft tissues: Negative. Disc levels: Maintained. Other: 2 mm calcified stone in the left kidney. Atherosclerotic plaque. IMPRESSION: 1. Interval development of an L1 compression fracture with at least mid 25% vertebral body height loss. 2. Chronic L2 compression fracture status post kyphoplasty. 3. Nonobstructive 2 mm left nephrolithiasis. 4.  Aortic Atherosclerosis (ICD10-I70.0). Electronically Signed   By: Iven Finn M.D.   On: 04/03/2022 18:40   CT Head Wo Contrast  Result Date: 04/03/2022 CLINICAL DATA:  Pt had a fall at home, pt c/o back and R elbow pain. States that he was walking and his walker folded up on him and he fell backwards, pt states he landed on his back and hit the back of head EXAM: CT HEAD WITHOUT CONTRAST CT CERVICAL SPINE WITHOUT CONTRAST TECHNIQUE: Multidetector CT imaging of the head and cervical spine was performed following the standard protocol without intravenous contrast. Multiplanar CT image reconstructions of the cervical spine were also generated. RADIATION DOSE REDUCTION: This exam was performed according to the departmental dose-optimization program which includes automated exposure control, adjustment of the mA and/or kV according to patient size and/or use of iterative reconstruction technique. COMPARISON:  CT head and cervical spine 10/18/2020 FINDINGS: CT HEAD FINDINGS Brain: No evidence of large-territorial acute infarction. No parenchymal hemorrhage. No mass lesion. No extra-axial  collection. No mass effect or midline shift. No hydrocephalus. Basilar cisterns are patent. Vascular: No hyperdense vessel. Skull: No acute fracture or focal lesion. Sinuses/Orbits: Paranasal sinuses and mastoid air cells are clear. The orbits are unremarkable. Other: None. CT CERVICAL SPINE FINDINGS Alignment: Normal. Skull base and vertebrae: C5-C6 intervertebral disc space narrowing, osteophyte formation, posterior disc osteophyte complex formation. No acute fracture. No aggressive appearing focal osseous lesion or focal pathologic process. Soft tissues and spinal canal: No prevertebral fluid or swelling. No visible canal hematoma. Upper chest: Unremarkable. Other: None. IMPRESSION: 1. No acute intracranial abnormality. 2. No acute displaced fracture or traumatic listhesis of the cervical spine. Electronically Signed   By: Iven Finn M.D.   On: 04/03/2022 18:32   CT Cervical Spine Wo Contrast  Result Date: 04/03/2022 CLINICAL DATA:  Pt had a fall at home, pt c/o back and R elbow pain. States that he was walking and his walker folded up on him and he fell backwards, pt  states he landed on his back and hit the back of head EXAM: CT HEAD WITHOUT CONTRAST CT CERVICAL SPINE WITHOUT CONTRAST TECHNIQUE: Multidetector CT imaging of the head and cervical spine was performed following the standard protocol without intravenous contrast. Multiplanar CT image reconstructions of the cervical spine were also generated. RADIATION DOSE REDUCTION: This exam was performed according to the departmental dose-optimization program which includes automated exposure control, adjustment of the mA and/or kV according to patient size and/or use of iterative reconstruction technique. COMPARISON:  CT head and cervical spine 10/18/2020 FINDINGS: CT HEAD FINDINGS Brain: No evidence of large-territorial acute infarction. No parenchymal hemorrhage. No mass lesion. No extra-axial collection. No mass effect or midline shift. No  hydrocephalus. Basilar cisterns are patent. Vascular: No hyperdense vessel. Skull: No acute fracture or focal lesion. Sinuses/Orbits: Paranasal sinuses and mastoid air cells are clear. The orbits are unremarkable. Other: None. CT CERVICAL SPINE FINDINGS Alignment: Normal. Skull base and vertebrae: C5-C6 intervertebral disc space narrowing, osteophyte formation, posterior disc osteophyte complex formation. No acute fracture. No aggressive appearing focal osseous lesion or focal pathologic process. Soft tissues and spinal canal: No prevertebral fluid or swelling. No visible canal hematoma. Upper chest: Unremarkable. Other: None. IMPRESSION: 1. No acute intracranial abnormality. 2. No acute displaced fracture or traumatic listhesis of the cervical spine. Electronically Signed   By: Iven Finn M.D.   On: 04/03/2022 18:32   DG Elbow 2 Views Right  Result Date: 04/03/2022 CLINICAL DATA:  Fall, right elbow pain EXAM: RIGHT ELBOW - 2 VIEW COMPARISON:  None Available. FINDINGS: There is no evidence of fracture, dislocation, or joint effusion. There is no evidence of arthropathy or other focal bone abnormality. Soft tissues are unremarkable. IMPRESSION: Negative. Electronically Signed   By: Davina Poke D.O.   On: 04/03/2022 18:13      Labs: BNP (last 3 results) No results for input(s): "BNP" in the last 8760 hours. Basic Metabolic Panel: Recent Labs  Lab 04/03/22 2013 04/04/22 0355  NA 140 138  K 4.2 3.8  CL 109 112*  CO2 23 23  GLUCOSE 123* 99  BUN 15 13  CREATININE 0.75 0.50*  CALCIUM 9.3 8.5*   Liver Function Tests: Recent Labs  Lab 04/03/22 2013  AST 28  ALT 33  ALKPHOS 101  BILITOT 1.1  PROT 6.9  ALBUMIN 4.4   No results for input(s): "LIPASE", "AMYLASE" in the last 168 hours. No results for input(s): "AMMONIA" in the last 168 hours. CBC: Recent Labs  Lab 04/03/22 2013 04/04/22 0355  WBC 12.5* 7.2  NEUTROABS 10.3*  --   HGB 14.0 12.1*  HCT 42.3 35.9*  MCV 100.0 98.4   PLT 202 194   Cardiac Enzymes: No results for input(s): "CKTOTAL", "CKMB", "CKMBINDEX", "TROPONINI" in the last 168 hours. BNP: Invalid input(s): "POCBNP" CBG: No results for input(s): "GLUCAP" in the last 168 hours. D-Dimer No results for input(s): "DDIMER" in the last 72 hours. Hgb A1c No results for input(s): "HGBA1C" in the last 72 hours. Lipid Profile No results for input(s): "CHOL", "HDL", "LDLCALC", "TRIG", "CHOLHDL", "LDLDIRECT" in the last 72 hours. Thyroid function studies No results for input(s): "TSH", "T4TOTAL", "T3FREE", "THYROIDAB" in the last 72 hours.  Invalid input(s): "FREET3" Anemia work up No results for input(s): "VITAMINB12", "FOLATE", "FERRITIN", "TIBC", "IRON", "RETICCTPCT" in the last 72 hours. Urinalysis    Component Value Date/Time   COLORURINE YELLOW (A) 05/19/2021 2317   APPEARANCEUR CLEAR (A) 05/19/2021 2317   LABSPEC 1.025 05/19/2021 2317   PHURINE  5.0 05/19/2021 2317   GLUCOSEU NEGATIVE 05/19/2021 2317   HGBUR NEGATIVE 05/19/2021 2317   BILIRUBINUR NEGATIVE 05/19/2021 2317   KETONESUR 80 (A) 05/19/2021 2317   PROTEINUR NEGATIVE 05/19/2021 2317   NITRITE NEGATIVE 05/19/2021 2317   LEUKOCYTESUR NEGATIVE 05/19/2021 2317   Sepsis Labs Recent Labs  Lab 04/03/22 2013 04/04/22 0355  WBC 12.5* 7.2   Microbiology No results found for this or any previous visit (from the past 240 hour(s)).   Total time spend on discharging this patient, including the last patient exam, discussing the hospital stay, instructions for ongoing care as it relates to all pertinent caregivers, as well as preparing the medical discharge records, prescriptions, and/or referrals as applicable, is 50 minutes.    Darlin Priestly, MD  [facility] 04/05/2022, 2:13 PM

## 2022-04-10 ENCOUNTER — Telehealth: Payer: Self-pay

## 2022-04-10 NOTE — Telephone Encounter (Signed)
Patient confirmed 05/04/2022

## 2022-04-10 NOTE — Telephone Encounter (Signed)
Referral received from [facility] in regards to this patient for a L1 compression fracture. Please schedule with Danielle in 3-4 weeks with xrays prior.

## 2022-05-02 ENCOUNTER — Other Ambulatory Visit: Payer: Self-pay

## 2022-05-02 DIAGNOSIS — S32010A Wedge compression fracture of first lumbar vertebra, initial encounter for closed fracture: Secondary | ICD-10-CM

## 2022-05-04 ENCOUNTER — Ambulatory Visit
Admission: RE | Admit: 2022-05-04 | Discharge: 2022-05-04 | Disposition: A | Discharge status: Home / Self Care | Payer: Medicare Other | Attending: Neurosurgery | Admitting: Neurosurgery

## 2022-05-04 ENCOUNTER — Ambulatory Visit (INDEPENDENT_AMBULATORY_CARE_PROVIDER_SITE_OTHER): Payer: Medicare Other | Admitting: Neurosurgery

## 2022-05-04 ENCOUNTER — Ambulatory Visit
Admission: RE | Admit: 2022-05-04 | Discharge: 2022-05-04 | Disposition: A | Discharge status: Home / Self Care | Payer: Medicare Other | Source: Ambulatory Visit | Attending: Neurosurgery | Admitting: Neurosurgery

## 2022-05-04 ENCOUNTER — Encounter: Payer: Self-pay | Admitting: Neurosurgery

## 2022-05-04 VITALS — BP 116/70 | Ht 70.0 in | Wt 104.0 lb

## 2022-05-04 DIAGNOSIS — S32010D Wedge compression fracture of first lumbar vertebra, subsequent encounter for fracture with routine healing: Secondary | ICD-10-CM

## 2022-05-04 DIAGNOSIS — S32010A Wedge compression fracture of first lumbar vertebra, initial encounter for closed fracture: Secondary | ICD-10-CM

## 2022-05-04 DIAGNOSIS — W19XXXA Unspecified fall, initial encounter: Secondary | ICD-10-CM | POA: Insufficient documentation

## 2022-05-04 MED ORDER — HYDROCODONE-ACETAMINOPHEN 5-325 MG PO TABS: 1.0000 | ORAL_TABLET | Freq: Two times a day (BID) | ORAL | 0 refills | Status: AC | PRN

## 2022-05-04 NOTE — Progress Notes (Signed)
Follow-up note: Referring Physician:  Darlin Priestly, MD [address] [place],  [place] [address]  Primary Physician:  System, Provider Not In  Chief Complaint:  hospital follow up for L1 compression fracture.   History of Present Illness: Shawn [name] is a 60 y.o. male who presents for hospital follow up. He was seen in consultation on 04/05/22 by Dr. Myer Haff for L1 fracture  after a mechanical fall. He has a history of L2 fracture s/p kyphoplasty and ALS which causes imbalanbce.  He has been treated conservatively in a brace. Despite this he continues to have back and right hip pain.   Review of Systems:  A 10 point review of systems is negative, and the pertinent positives and negatives detailed in the HPI.  Past Medical History: Past Medical History:  Diagnosis Date   ALS (amyotrophic lateral sclerosis) (HCC)    Asthma    Coronary artery disease    Hyperlipidemia    Renal stone    Stroke [facility])     Past Surgical History: Past Surgical History:  Procedure Laterality Date   CARDIAC CATHETERIZATION  2006   negative   CYSTOSCOPY W/ URETERAL STENT REMOVAL Left 06/11/2015   Procedure: CYSTOSCOPY WITH STENT EXCHANGE;  Surgeon: Malen Gauze, MD;  Location: [facility] ORS;  Service: Urology;  Laterality: Left;   CYSTOSCOPY WITH STENT PLACEMENT Left 05/20/2015   Procedure: CYSTOSCOPY WITH STENT PLACEMENT;  Surgeon: Crist Fat, MD;  Location: [facility] ORS;  Service: Urology;  Laterality: Left;   CYSTOSCOPY/RETROGRADE/URETEROSCOPY/STONE EXTRACTION WITH BASKET  2000   pt. unsure of which side done   CYSTOSCOPY/URETEROSCOPY/HOLMIUM LASER Left 06/11/2015   Procedure: CYSTOSCOPY/URETEROSCOPY/HOLMIUM LASER;  Surgeon: Malen Gauze, MD;  Location: [facility] ORS;  Service: Urology;  Laterality: Left;   IR KYPHO LUMBAR INC FX REDUCE BONE BX UNI/BIL CANNULATION INC/IMAGING  01/06/2022   KNEE SURGERY Right 84,96, 2003   x3   SHOULDER SURGERY Left 2010    Allergies: Allergies as  of 05/04/2022 - Review Complete 04/03/2022  Allergen Reaction Noted   Aspirin Other (See Comments), Itching, Shortness Of Breath, and Swelling 01/20/2015   Compazine [prochlorperazine] Other (See Comments) 11/08/2021   Morphine and related Itching 05/20/2015   Naproxen Other (See Comments) 01/20/2015   Penicillins Rash 05/20/2015   Tramadol Itching 09/25/2017    Medications: Outpatient Encounter Medications as of 05/04/2022  Medication Sig   acetaminophen (TYLENOL) 160 MG/5ML solution Take 480 mg by mouth every 6 (six) hours as needed for mild pain or moderate pain.   albuterol (VENTOLIN HFA) 108 (90 Base) MCG/ACT inhaler Inhale 1-2 puffs into the lungs every 6 (six) hours as needed for wheezing or shortness of breath.   atorvastatin (LIPITOR) 20 MG tablet Take 20 mg by mouth daily.   Baclofen 5 MG TABS Take 10 mg by mouth 3 (three) times daily.   fluticasone furoate-vilanterol (BREO ELLIPTA) 100-25 MCG/ACT AEPB Inhale 1 puff into the lungs in the morning.   gabapentin (NEURONTIN) 300 MG capsule Take 300 mg by mouth 3 (three) times daily.   montelukast (SINGULAIR) 10 MG tablet Take 10 mg by mouth at bedtime.   riluzole (RILUTEK) 50 MG tablet Take 50 mg by mouth 2 (two) times daily.   traZODone (DESYREL) 100 MG tablet Take 100 mg by mouth at bedtime.   No facility-administered encounter medications on file as of 05/04/2022.    Social History: Social History   Tobacco Use   Smoking status: Never   Smokeless tobacco: Never  Vaping Use   Vaping Use: Never used  Substance Use Topics   Alcohol use: Not Currently    Alcohol/week: 0.0 standard drinks of alcohol    Comment: occasionally   Drug use: Never    Family Medical History: Family History  Problem Relation Age of Onset   Cancer Mother    Heart failure Mother    Hypertension Mother    CVA Mother    Diabetes Other    Other Father        unknown medical history    Exam: @VITALWITHPAIN @  General: A&O.  ROM of spine:  limited due to pain.  Palpation of spine: TTP throughout.  Strength in BLE 3/5 HP, KE. 4+ bilateral DF and EHL, 3 left PF and 4- right PF. Reflexes are 3+ and symmetric at the patella and achilles.   Bilateral lower extremity sensation is intact to light touch.  Gait: Ambulates with a slowed wide based gait.   Imaging: 05/04/22 lumbar xrays Largely stable appearing L1 compression fracture  I have personally reviewed the images and agree with the above interpretation.  Assessment and Plan: [name] is a pleasant 61 y.o. male with L1 compression fracture that appears largely stable since 7/3 CT scan. Despite this, he continues to have pain. I do not think there is any role for neurosurgical intervention or Kyphoplasty at this time but would like for him to establish with a pain management specialist given his history and on going pain. I have placed a referral for this. I will provide a small prescription for Norco in the interim to get him to this visit. We discussed weaning out of the brace. He is scheduled next week for a peg placement. From a stability standpoint and can come out of the brace at this time but I did warn him he may have increased muscular back pain as a result of this.  I will see him going forward on an as needed basis. He expressed understanding and was in agreement with this plan.   I spent a total of 30 minutes in both face-to-face and non-face-to-face activities for this visit on the date of this encounter including review of imaging, HPI, PE, documentation and order placement  9/3 PA-C Neurosurgery

## 2022-06-14 ENCOUNTER — Encounter: Payer: Self-pay | Admitting: Student in an Organized Health Care Education/Training Program

## 2022-06-14 ENCOUNTER — Ambulatory Visit
Payer: Medicare Other | Attending: Student in an Organized Health Care Education/Training Program | Admitting: Student in an Organized Health Care Education/Training Program

## 2022-06-14 VITALS — BP 131/93 | HR 70 | Temp 97.4°F | Ht 70.0 in | Wt 122.0 lb

## 2022-06-14 DIAGNOSIS — S32010A Wedge compression fracture of first lumbar vertebra, initial encounter for closed fracture: Secondary | ICD-10-CM | POA: Insufficient documentation

## 2022-06-14 DIAGNOSIS — G894 Chronic pain syndrome: Secondary | ICD-10-CM | POA: Diagnosis present

## 2022-06-14 DIAGNOSIS — M79662 Pain in left lower leg: Secondary | ICD-10-CM | POA: Insufficient documentation

## 2022-06-14 DIAGNOSIS — S32020A Wedge compression fracture of second lumbar vertebra, initial encounter for closed fracture: Secondary | ICD-10-CM | POA: Insufficient documentation

## 2022-06-14 DIAGNOSIS — G1221 Amyotrophic lateral sclerosis: Secondary | ICD-10-CM | POA: Diagnosis present

## 2022-06-14 DIAGNOSIS — M79661 Pain in right lower leg: Secondary | ICD-10-CM | POA: Diagnosis present

## 2022-06-14 NOTE — Progress Notes (Signed)
Patient: Shawn [name]  Service Category: E/M  Provider: Gillis Santa, MD  DOB: 03/07/1961  DOS: 06/14/2022  Referring Provider: Loleta Dicker, PA  MRN: [id number]  Setting: Ambulatory outpatient  PCP: System, Provider Not In  Type: New Patient  Specialty: Interventional Pain Management    Location: Office  Delivery: Face-to-face     Primary Reason(s) for Visit: Encounter for initial evaluation of one or more chronic problems (new to examiner) potentially causing chronic pain, and posing a threat to normal musculoskeletal function. (Level of risk: High) CC: Back Pain (low)  HPI  Shawn [name] is a 61 y.o. year old, male patient, who comes for the first time to our practice referred by Loleta Dicker, PA for our initial evaluation of his chronic pain. He has Ureteral stone; Compression fracture of L2 ([place]); Lumbago; Pain in both lower legs; ALS (amyotrophic lateral sclerosis) ([place]); Asthma, chronic; Acute respiratory failure with hypoxia ([place]); Right leg pain; COVID-19 virus infection; Acute hypoxemic respiratory failure due to COVID-19 [facility]); Closed compression fracture of body of L1 vertebra (HCC); and Dyslipidemia on their problem list. Today he comes in for evaluation of his Back Pain (low)  Pain Assessment: Location: Left, Right Back Radiating: pain radiaities down both leg Onset: 1 to 4 weeks ago Duration: Chronic pain Quality: Aching, Burning, Constant, Stabbing, Throbbing Severity: 8 /10 (subjective, self-reported pain score)  Effect on ADL: limits my daily activites Timing: Constant Modifying factors: Meds and heat BP: (!) 131/93  HR: 70  Onset and Duration: Sudden and Present longer than 3 months Cause of pain: Unknown Severity: NAS-11 at its worse: 10/10, NAS-11 at its best: 7/10, NAS-11 now: 8/10, and NAS-11 on the average: 8+/10 Timing: Morning, Noon, Night, and Not influenced by the time of the day Aggravating Factors: Bending, Eating, Kneeling, Lifiting, Motion,  Prolonged standing, Squatting, Stooping , Twisting, Walking, Walking uphill, and Walking downhill Alleviating Factors: Cold packs, Lying down, Resting, Sitting, and Using a brace Associated Problems: Day-time cramps, Night-time cramps, Fatigue, Inability to control bladder (urine), Numbness, Spasms, Swelling, Tingling, Weakness, and Pain that does not allow patient to sleep Quality of Pain: Aching, Burning, Cramping, Deep, Exhausting, Getting longer, Horrible, Nagging, Pressure-like, Sharp, Shooting, Stabbing, Throbbing, Tingling, and Uncomfortable Previous Examinations or Tests: CT scan, EMG/PNCV, MRI scan, X-rays, Neurological evaluation, and Orthopedic evaluation Previous Treatments: Narcotic medications and Physical Therapy  Shawn [name] is a pleasant 61 year old male male who presents with a chief complaint of low back pain.  He has a history of L1 and L2 compression fracture.  His L1 compression fracture occurred on 04/05/2022 after a fall.  He has a history of L2 compression fracture status post kyphoplasty.  He does have a history of ALS unfortunately.  He has progressive shortness of breath, difficulty walking and severe weakness.  He recently had a G-tube placed last month.  He is here primarily for medication management/palliative care of chronic low back pain secondary to L1 compression fracture.  He states that he has found benefit with hydrocodone compared to oxycodone.  He was very honest with me and states that his urine toxicology screen will be positive for THC as he did smoke last month.  He states that he is no longer utilizing THC.    Historic Controlled Substance Pharmacotherapy Review   Historical Monitoring: The patient  reports no history of drug use. List of prior UDS Testing: No results found for: "MDMA", "COCAINSCRNUR", "PCPSCRNUR", "PCPQUANT", "CANNABQUANT", "THCU", "ETH", "CBDTHCR", "D8THCCBX", "D9THCCBX" Historical Background Evaluation: [place] PMP: PDMP not  reviewed this encounter.  Review of the past 14-month conducted.             [place] Department of public safety, offender search: (Editor, commissioningInformation) Non-contributory Risk Assessment Profile: Aberrant behavior: None observed or detected today Risk factors for fatal opioid overdose: None identified today Fatal overdose hazard ratio (HR): Calculation deferred Non-fatal overdose hazard ratio (HR): Calculation deferred Risk of opioid abuse or dependence: 0.7-3.0% with doses ? 36 MME/day and 6.1-26% with doses ? 120 MME/day. Substance use disorder (SUD) risk level: See below Personal History of Substance Abuse (SUD-Substance use disorder):  Alcohol:    Illegal Drugs:    Rx Drugs:    ORT Risk Level calculation:    ORT Scoring interpretation table:  Score <3 = Low Risk for SUD  Score between 4-7 = Moderate Risk for SUD  Score >8 = High Risk for Opioid Abuse   PHQ-2 Depression Scale:  Total score:    PHQ-2 Scoring interpretation table: (Score and probability of major depressive disorder)  Score 0 = No depression  Score 1 = 15.4% Probability  Score 2 = 21.1% Probability  Score 3 = 38.4% Probability  Score 4 = 45.5% Probability  Score 5 = 56.4% Probability  Score 6 = 78.6% Probability   PHQ-9 Depression Scale:  Total score:    PHQ-9 Scoring interpretation table:  Score 0-4 = No depression  Score 5-9 = Mild depression  Score 10-14 = Moderate depression  Score 15-19 = Moderately severe depression  Score 20-27 = Severe depression (2.4 times higher risk of SUD and 2.89 times higher risk of overuse)   Pharmacologic Plan: As per protocol, I have not taken over any controlled substance management, pending the results of ordered tests and/or consults.            Initial impression: Pending review of available data and ordered tests.  Meds   Current Outpatient Medications:    acetaminophen (TYLENOL) 160 MG/5ML solution, Take 480 mg by mouth every 6 (six) hours as needed for mild pain or moderate pain., Disp: , Rfl:     albuterol (VENTOLIN HFA) 108 (90 Base) MCG/ACT inhaler, Inhale 1-2 puffs into the lungs every 6 (six) hours as needed for wheezing or shortness of breath., Disp: , Rfl:    atorvastatin (LIPITOR) 20 MG tablet, Take 20 mg by mouth daily., Disp: , Rfl:    Baclofen 5 MG TABS, Take 10 mg by mouth 3 (three) times daily., Disp: , Rfl:    fluticasone furoate-vilanterol (BREO ELLIPTA) 100-25 MCG/ACT AEPB, Inhale 1 puff into the lungs in the morning., Disp: , Rfl:    gabapentin (NEURONTIN) 300 MG capsule, Take 300 mg by mouth 3 (three) times daily., Disp: , Rfl:    montelukast (SINGULAIR) 10 MG tablet, Take 10 mg by mouth at bedtime., Disp: , Rfl:    riluzole (RILUTEK) 50 MG tablet, Take 50 mg by mouth 2 (two) times daily., Disp: , Rfl:    traZODone (DESYREL) 100 MG tablet, Take 100 mg by mouth at bedtime., Disp: , Rfl:   Imaging Review    Narrative CLINICAL DATA:  Pt had a fall at home, pt c/o back and R elbow pain. States that he was walking and his walker folded up on him and he fell backwards, pt states he landed on his back and hit the back of head  EXAM: CT HEAD WITHOUT CONTRAST  CT CERVICAL SPINE WITHOUT CONTRAST  TECHNIQUE: Multidetector CT imaging of the head and cervical spine was performed  following the standard protocol without intravenous contrast. Multiplanar CT image reconstructions of the cervical spine were also generated.  RADIATION DOSE REDUCTION: This exam was performed according to the departmental dose-optimization program which includes automated exposure control, adjustment of the mA and/or kV according to patient size and/or use of iterative reconstruction technique.  COMPARISON:  CT head and cervical spine 10/18/2020  FINDINGS: CT HEAD FINDINGS  Brain:  No evidence of large-territorial acute infarction. No parenchymal hemorrhage. No mass lesion. No extra-axial collection.  No mass effect or midline shift. No hydrocephalus. Basilar cisterns are  patent.  Vascular: No hyperdense vessel.  Skull: No acute fracture or focal lesion.  Sinuses/Orbits: Paranasal sinuses and mastoid air cells are clear. The orbits are unremarkable.  Other: None.  CT CERVICAL SPINE FINDINGS  Alignment: Normal.  Skull base and vertebrae: C5-C6 intervertebral disc space narrowing, osteophyte formation, posterior disc osteophyte complex formation. No acute fracture. No aggressive appearing focal osseous lesion or focal pathologic process.  Soft tissues and spinal canal: No prevertebral fluid or swelling. No visible canal hematoma.  Upper chest: Unremarkable.  Other: None.  IMPRESSION: 1. No acute intracranial abnormality. 2. No acute displaced fracture or traumatic listhesis of the cervical spine.   Electronically Signed By: Iven Finn M.D. On: 04/03/2022 18:32    Narrative CLINICAL DATA:  Pain status post fall  EXAM: CERVICAL SPINE - 2-3 VIEW  COMPARISON:  None.  FINDINGS: There is no significant prevertebral soft tissue swelling. There is no displaced fracture. No dislocation. Advanced degenerative changes are noted at the C4-C5 and C5-C6 levels.  IMPRESSION: No acute osseous abnormality.   Electronically Signed By: Constance Holster M.D. On: 07/28/2019 17:48    Narrative Clinical Data: Fall, pain. LUMBAR SPINE - 4 VIEW, 02/15/05 AT 1850 HOURS: Findings: There is anatomic alignment through the vertebral bodies. There is no vertebral body height loss. Disc height is maintained. No pars defects are seen. Little if any degenerative change in the facets of the lower lumbar spine is seen.  Impression No acute bony injury in the lumbar spine. CERVICAL SPINE - 4 VIEW, 02/15/05 AT 1850 HOURS: Findings: Severe narrowing of the C5-6 disc space is present with anterior and posterior osteophytes. Moderate narrowing at C4-5 is present. There is 1-2 mm retrolisthesis of C5 upon C6 noted, felt to be due to degenerative  change. There is no vertebral body height loss. The soft tissues are within normal limits. No definite acute fractures are seen. The odontoid is within normal limits. The swimmer's view is included on the thoracic spine studies. IMPRESSION: No acute bony injury in the cervical spine. Degenerative changes are noted.  Provider: Annabell Sabal, Leatha Gilding  Narrative CLINICAL DATA:  Pain status post fall  EXAM: LEFT SHOULDER - 2+ VIEW  COMPARISON:  None.  FINDINGS: There is no evidence of fracture or dislocation. There is no evidence of arthropathy or other focal bone abnormality. Soft tissues are unremarkable. There are old healed left-sided rib fractures.  IMPRESSION: Negative.   Electronically Signed By: Constance Holster M.D. On: 07/28/2019 17:46     Narrative CLINICAL DATA:  Fall, back pain  EXAM: MRI THORACIC WITHOUT AND WITH CONTRAST  TECHNIQUE: Multiplanar and multiecho pulse sequences of the thoracic spine were obtained without and with intravenous contrast.  CONTRAST:  62m GADAVIST GADOBUTROL 1 MMOL/ML IV SOLN  COMPARISON:  CT 10/18/2020  FINDINGS: Alignment:  Physiologic.  Vertebrae: No fracture, evidence of discitis, or bone lesion. No abnormal enhancement on postcontrast sequences.  Cord:  Normal signal and morphology.  Paraspinal and other soft tissues: Negative.  Disc levels:  Negative. Intervertebral discs of the thoracic spine are preserved without disc height loss or focal disc protrusion. Facet joints within normal limits. No foraminal or canal stenosis at any level within the thoracic spine.  IMPRESSION: Unremarkable pre and post-contrast MRI of the thoracic spine.   Electronically Signed By: Davina Poke D.O. On: 11/07/2021 12:40 Narrative Clinical Data: Fall. THORACIC SPINE - 2 VIEW, 02/15/05 AT 1900 HOURS: Findings: The upper thoracic spine is obscured on the swimmer's view. Throughout the visualized thoracic spine, there  is no acute fracture or dislocation. There is anatomic alignment to the vertebral bodies.  Impression Limited view of the upper thoracic spine. Otherwise no evidence of acute bony injury.  Provider: Annabell Sabal, Leatha Gilding    Lumbosacral Imaging: Lumbar MR wo contrast: Results for orders placed during the hospital encounter of 04/03/22  MR LUMBAR SPINE WO CONTRAST  Narrative CLINICAL DATA:  61 year old male status post fall with low back pain. History of augmented L2 compression fracture.  EXAM: MRI LUMBAR SPINE WITHOUT CONTRAST  TECHNIQUE: Multiplanar, multisequence MR imaging of the lumbar spine was performed. No intravenous contrast was administered.  COMPARISON:  Lumbar MRI 11/07/2021.  Lumbar spine CT yesterday.  FINDINGS: Segmentation: Normal by CT, the same numbering system used on the previous MRI.  Alignment: Lumbar lordosis has not significantly changed since February.  Vertebrae: Augmented L2 compression fracture with no residual marrow edema at that level. L1 compression fracture with mostly superior endplate deformity and approximately 25% loss of vertebral body height associated with moderate marrow edema. Edema mildly tracks into both pedicles, but otherwise the L1 posterior elements appear intact. Minimal retropulsion of bone (series 8, image 7).  No other marrow edema or evidence of acute osseous abnormality. Background bone marrow signal within normal limits. Intact visible sacrum.  Conus medullaris and cauda equina: Conus extends to the T12-L1 level. No lower spinal cord or conus signal abnormality.  Paraspinal and other soft tissues: Mild if any paraspinal edema or hematoma at L1. Otherwise negative.  Disc levels:  Unchanged mild for age under lying lumbar spine degeneration. No significant spinal stenosis. No T12 or L1 foraminal stenosis.  IMPRESSION: 1. Acute to subacute L1 compression fracture with 25% loss of vertebral body height.  Minimal retropulsion, and no spinal stenosis or other complicating features.  2. Satisfactory augmented L2 compression fracture. Mild for age lumbar spine degeneration.   Electronically Signed By: Genevie Ann M.D. On: 04/04/2022 12:43   Narrative CLINICAL DATA:  Low back pain after fall  EXAM: MRI LUMBAR SPINE WITHOUT AND WITH CONTRAST  TECHNIQUE: Multiplanar and multiecho pulse sequences of the lumbar spine were obtained without and with intravenous contrast.  CONTRAST:  57m GADAVIST GADOBUTROL 1 MMOL/ML IV SOLN  COMPARISON:  CT 05/20/2015  FINDINGS: Segmentation:  Standard.  Alignment:  Physiologic.  Vertebrae: Acute mild superior endplate compression fracture of L2 with approximately 10% vertebral body height loss. No retropulsion. No additional fractures. No evidence of discitis. No suspicious bone lesion. No abnormal enhancement on postcontrast sequences.  Conus medullaris and cauda equina: Conus extends to the T12-L1 level. Conus and cauda equina appear normal.  Paraspinal and other soft tissues: Negative.  Disc levels:  T12-L1 through L3-L4: Unremarkable.  L4-L5: Mild annular disc bulge and mild bilateral facet hypertrophy. Borderline-mild left foraminal stenosis. No canal stenosis.  L5-S1: No disc protrusion. Mild bilateral facet arthropathy. No foraminal or canal stenosis.  IMPRESSION: 1.  Acute mild superior endplate compression fracture of L2 with approximately 10% vertebral body height loss. No retropulsion. 2. Mild lower lumbar spondylosis with borderline-mild left foraminal stenosis at L4-5. No canal stenosis at any level.   Electronically Signed By: Davina Poke D.O. On: 11/07/2021 12:43 Narrative CLINICAL DATA:  Back trauma, no prior imaging (Age >= 16y). Pt had a fall at home, pt c/o back and R elbow pain. States that he was walking and his walker folded up on him and he fell backwards, pt states he landed on his back and hit the back  of head.  EXAM: CT LUMBAR SPINE WITHOUT CONTRAST  TECHNIQUE: Multidetector CT imaging of the lumbar spine was performed without intravenous contrast administration. Multiplanar CT image reconstructions were also generated.  RADIATION DOSE REDUCTION: This exam was performed according to the departmental dose-optimization program which includes automated exposure control, adjustment of the mA and/or kV according to patient size and/or use of iterative reconstruction technique.  COMPARISON:  MRI lumbar spine 11/07/2021, x-ray lumbar spine 12/29/2021  FINDINGS: Segmentation: 5 lumbar type vertebrae.  Alignment: Normal.  Vertebrae: Chronic L2 compression fracture status post kyphoplasty. Interval development of an L1 compression fracture with at least mid 25% vertebral body height loss.  Paraspinal and other soft tissues: Negative.  Disc levels: Maintained.  Other: 2 mm calcified stone in the left kidney. Atherosclerotic plaque.  IMPRESSION: 1. Interval development of an L1 compression fracture with at least mid 25% vertebral body height loss. 2. Chronic L2 compression fracture status post kyphoplasty. 3. Nonobstructive 2 mm left nephrolithiasis. 4.  Aortic Atherosclerosis (ICD10-I70.0).   Electronically Signed By: Iven Finn M.D. On: 04/03/2022 18:40   Narrative CLINICAL DATA:  History of closed compression fracture of L1 status post fall.  EXAM: LUMBAR SPINE - 2-3 VIEW  COMPARISON:  December 29, 2021  FINDINGS: An acute compression fracture deformity is seen at the level of the L1 vertebral body. This involves predominantly the superior aspect of the vertebral body and superior endplate. No displaced fracture fragments are identified. Prior vertebroplasty is seen at the level of L2. Alignment is normal. Intervertebral disc spaces are maintained.  IMPRESSION: Acute compression fracture deformity at the level of L1.   Electronically Signed By:  Virgina Norfolk M.D. On: 05/04/2022 16:47   Narrative Clinical Data: Fall, pain. LUMBAR SPINE - 4 VIEW, 02/15/05 AT 1850 HOURS: Findings: There is anatomic alignment through the vertebral bodies. There is no vertebral body height loss. Disc height is maintained. No pars defects are seen. Little if any degenerative change in the facets of the lower lumbar spine is seen.  Impression No acute bony injury in the lumbar spine. CERVICAL SPINE - 4 VIEW, 02/15/05 AT 1850 HOURS: Findings: Severe narrowing of the C5-6 disc space is present with anterior and posterior osteophytes. Moderate narrowing at C4-5 is present. There is 1-2 mm retrolisthesis of C5 upon C6 noted, felt to be due to degenerative change. There is no vertebral body height loss. The soft tissues are within normal limits. No definite acute fractures are seen. The odontoid is within normal limits. The swimmer's view is included on the thoracic spine studies. IMPRESSION: No acute bony injury in the cervical spine. Degenerative changes are noted.  Provider: Annabell Sabal, Leatha Gilding  DG HIP UNILAT WITH PELVIS 2-3 VIEWS RIGHT  Narrative CLINICAL DATA:  Pain status post fall  EXAM: DG HIP (WITH OR WITHOUT PELVIS) 2-3V RIGHT  COMPARISON:  None.  FINDINGS: There is no evidence of hip fracture  or dislocation. There is no evidence of arthropathy or other focal bone abnormality.  IMPRESSION: Negative.   Electronically Signed By: Constance Holster M.D. On: 07/28/2019 17:49  DG Elbow Complete Left  Narrative CLINICAL DATA:  Pain status post fall  EXAM: LEFT ELBOW - COMPLETE 3+ VIEW  COMPARISON:  None.  FINDINGS: There is no evidence of fracture, dislocation, or joint effusion. There is no evidence of arthropathy or other focal bone abnormality. Soft tissues are unremarkable.  IMPRESSION: Negative.   Electronically Signed By: Constance Holster M.D. On: 07/28/2019 17:46    Narrative CLINICAL DATA:  [place],  right hand and wrist pain  EXAM: RIGHT HAND - COMPLETE 3+ VIEW; RIGHT WRIST - COMPLETE 3+ VIEW  COMPARISON:  None.  FINDINGS: Right hand: Frontal, oblique, lateral views are obtained. No acute fracture, subluxation, or dislocation. Mild diffuse distal interphalangeal joint space narrowing consistent with osteoarthritis. Soft tissues are unremarkable.  Right wrist: Frontal, oblique, lateral, and ulnar deviated views of the right wrist are obtained. No fracture, subluxation, or dislocation. Joint spaces are well preserved. Soft tissues are normal.  IMPRESSION: 1. Osteoarthritis. 2. No acute displaced fracture.   Electronically Signed By: Randa Ngo M.D. On: 01/11/2021 17:05    Narrative CLINICAL DATA:  [place], right hand and wrist pain  EXAM: RIGHT HAND - COMPLETE 3+ VIEW; RIGHT WRIST - COMPLETE 3+ VIEW  COMPARISON:  None.  FINDINGS: Right hand: Frontal, oblique, lateral views are obtained. No acute fracture, subluxation, or dislocation. Mild diffuse distal interphalangeal joint space narrowing consistent with osteoarthritis. Soft tissues are unremarkable.  Right wrist: Frontal, oblique, lateral, and ulnar deviated views of the right wrist are obtained. No fracture, subluxation, or dislocation. Joint spaces are well preserved. Soft tissues are normal.  IMPRESSION: 1. Osteoarthritis. 2. No acute displaced fracture.   Electronically Signed By: Randa Ngo M.D. On: 01/11/2021 17:05    Narrative CLINICAL DATA:  Pain status post fall  EXAM: LEFT HAND - COMPLETE 3+ VIEW  COMPARISON:  None.  FINDINGS: There is no evidence of fracture or dislocation. There is no evidence of arthropathy or other focal bone abnormality. Soft tissues are unremarkable.  IMPRESSION: Negative.   Electronically Signed By: Constance Holster M.D. On: 07/28/2019 17:47   Complexity Note: Imaging results reviewed.                         ROS  Cardiovascular: Chest  pain and Heart catheterization Pulmonary or Respiratory: No reported pulmonary signs or symptoms such as wheezing and difficulty taking a deep full breath (Asthma), difficulty blowing air out (Emphysema), coughing up mucus (Bronchitis), persistent dry cough, or temporary stoppage of breathing during sleep Neurological: No reported neurological signs or symptoms such as seizures, abnormal skin sensations, urinary and/or fecal incontinence, being born with an abnormal open spine and/or a tethered spinal cord Psychological-Psychiatric: No reported psychological or psychiatric signs or symptoms such as difficulty sleeping, anxiety, depression, delusions or hallucinations (schizophrenial), mood swings (bipolar disorders) or suicidal ideations or attempts Gastrointestinal: No reported gastrointestinal signs or symptoms such as vomiting or evacuating blood, reflux, heartburn, alternating episodes of diarrhea and constipation, inflamed or scarred liver, or pancreas or irrregular and/or infrequent bowel movements Genitourinary: No reported renal or genitourinary signs or symptoms such as difficulty voiding or producing urine, peeing blood, non-functioning kidney, kidney stones, difficulty emptying the bladder, difficulty controlling the flow of urine, or chronic kidney disease Hematological: No reported hematological signs or symptoms such as prolonged bleeding,  low or poor functioning platelets, bruising or bleeding easily, hereditary bleeding problems, low energy levels due to low hemoglobin or being anemic Endocrine: No reported endocrine signs or symptoms such as high or low blood sugar, rapid heart rate due to high thyroid levels, obesity or weight gain due to slow thyroid or thyroid disease Rheumatologic: No reported rheumatological signs and symptoms such as fatigue, joint pain, tenderness, swelling, redness, heat, stiffness, decreased range of motion, with or without associated rash Musculoskeletal: Negative  for myasthenia gravis, muscular dystrophy, multiple sclerosis or malignant hyperthermia Work History: Disabled  Allergies  Mr. Sanker is allergic to aspirin, compazine [prochlorperazine], morphine and related, naproxen, penicillins, and tramadol.  Laboratory Chemistry Profile   Renal Lab Results  Component Value Date   BUN 13 04/04/2022   CREATININE 0.50 (L) 04/04/2022   GFRAA >60 05/20/2015   GFRNONAA >60 04/04/2022   PROTEINUR NEGATIVE 05/19/2021     Electrolytes Lab Results  Component Value Date   NA 138 04/04/2022   K 3.8 04/04/2022   CL 112 (H) 04/04/2022   CALCIUM 8.5 (L) 04/04/2022     Hepatic Lab Results  Component Value Date   AST 28 04/03/2022   ALT 33 04/03/2022   ALBUMIN 4.4 04/03/2022   ALKPHOS 101 04/03/2022   LIPASE 27 05/20/2015     ID Lab Results  Component Value Date   HIV Non Reactive 11/13/2021   SARSCOV2NAA POSITIVE (A) 11/12/2021   STAPHAUREUS NEGATIVE 05/20/2015   MRSAPCR NEGATIVE 05/20/2015     Bone No results found for: "VD25OH", "VD125OH2TOT", "[id number]", "XF8182XH3", "25OHVITD1", "25OHVITD2", "71IRCVEL3", "TESTOFREE", "TESTOSTERONE"   Endocrine Lab Results  Component Value Date   GLUCOSE 99 04/04/2022   GLUCOSEU NEGATIVE 05/19/2021   TSH 1.015 11/12/2021     Neuropathy Lab Results  Component Value Date   HIV Non Reactive 11/13/2021     CNS No results found for: "COLORCSF", "APPEARCSF", "RBCCOUNTCSF", "WBCCSF", "POLYSCSF", "LYMPHSCSF", "EOSCSF", "PROTEINCSF", "GLUCCSF", "JCVIRUS", "CSFOLI", "IGGCSF", "LABACHR", "ACETBL"   Inflammation (CRP: Acute  ESR: Chronic) No results found for: "CRP", "ESRSEDRATE", "LATICACIDVEN"   Rheumatology No results found for: "RF", "ANA", "LABURIC", "URICUR", "LYMEIGGIGMAB", "LYMEABIGMQN", "HLAB27"   Coagulation Lab Results  Component Value Date   INR 1.0 01/06/2022   LABPROT 12.9 01/06/2022   PLT 194 04/04/2022     Cardiovascular Lab Results  Component Value Date   CKTOTAL 79  11/07/2021   HGB 12.1 (L) 04/04/2022   HCT 35.9 (L) 04/04/2022     Screening Lab Results  Component Value Date   SARSCOV2NAA POSITIVE (A) 11/12/2021   STAPHAUREUS NEGATIVE 05/20/2015   MRSAPCR NEGATIVE 05/20/2015   HIV Non Reactive 11/13/2021     Cancer No results found for: "CEA", "CA125", "LABCA2"   Allergens No results found for: "ALMOND", "APPLE", "ASPARAGUS", "AVOCADO", "BANANA", "BARLEY", "BASIL", "BAYLEAF", "GREENBEAN", "LIMABEAN", "WHITEBEAN", "BEEFIGE", "REDBEET", "BLUEBERRY", "BROCCOLI", "CABBAGE", "MELON", "CARROT", "CASEIN", "CASHEWNUT", "CAULIFLOWER", "CELERY"     Note: Lab results reviewed.  [place]  Drug: Mr. Drudge  reports no history of drug use. Alcohol:  reports that he does not currently use alcohol. Tobacco:  reports that he has never smoked. He has never used smokeless tobacco. Medical:  has a past medical history of ALS (amyotrophic lateral sclerosis) ([place]), Asthma, Coronary artery disease, Hyperlipidemia, Renal stone, and Stroke ([place]). Family: family history includes CVA in his mother; Cancer in his mother; Diabetes in an other family member; Heart failure in his mother; Hypertension in his mother; Other in his father.  Past Surgical History:  Procedure Laterality  Date   CARDIAC CATHETERIZATION  2006   negative   CYSTOSCOPY W/ URETERAL STENT REMOVAL Left 06/11/2015   Procedure: CYSTOSCOPY WITH STENT EXCHANGE;  Surgeon: Cleon Gustin, MD;  Location: [facility];  Service: Urology;  Laterality: Left;   CYSTOSCOPY WITH STENT PLACEMENT Left 05/20/2015   Procedure: CYSTOSCOPY WITH STENT PLACEMENT;  Surgeon: Ardis Hughs, MD;  Location: [facility];  Service: Urology;  Laterality: Left;   CYSTOSCOPY/RETROGRADE/URETEROSCOPY/STONE EXTRACTION WITH BASKET  2000   pt. unsure of which side done   CYSTOSCOPY/URETEROSCOPY/HOLMIUM LASER Left 06/11/2015   Procedure: CYSTOSCOPY/URETEROSCOPY/HOLMIUM LASER;  Surgeon: Cleon Gustin, MD;  Location: [facility];  Service:  Urology;  Laterality: Left;   IR KYPHO LUMBAR INC FX REDUCE BONE BX UNI/BIL CANNULATION INC/IMAGING  01/06/2022   KNEE SURGERY Right 84,96, 2003   x3   SHOULDER SURGERY Left 2010   Active Ambulatory Problems    Diagnosis Date Noted   Ureteral stone 05/20/2015   Compression fracture of L2 ([place]) 11/07/2021   Lumbago 11/07/2021   Pain in both lower legs 11/07/2021   ALS (amyotrophic lateral sclerosis) ([place]) 11/07/2021   Asthma, chronic 11/07/2021   Acute respiratory failure with hypoxia ([place]) 11/12/2021   Right leg pain 11/13/2021   COVID-19 virus infection 11/13/2021   Acute hypoxemic respiratory failure due to COVID-19 ([place]) 11/13/2021   Closed compression fracture of body of L1 vertebra ([place]) 04/03/2022   Dyslipidemia 04/03/2022   Resolved Ambulatory Problems    Diagnosis Date Noted   No Resolved Ambulatory Problems   Past Medical History:  Diagnosis Date   Asthma    Coronary artery disease    Hyperlipidemia    Renal stone    Stroke [facility])    Constitutional Exam  General appearance: alert, cooperative, in moderate distress, and cachectic Vitals:   06/14/22 1244  BP: (!) 131/93  Pulse: 70  Temp: (!) 97.4 F (36.3 C)  SpO2: 96%  Weight: 122 lb (55.3 kg)  Height: 5' 10"  (1.778 m)   BMI Assessment: Estimated body mass index is 17.51 kg/m as calculated from the following:   Height as of this encounter: 5' 10"  (1.778 m).   Weight as of this encounter: 122 lb (55.3 kg).  BMI interpretation table: BMI level Category Range association with higher incidence of chronic pain  <18 kg/m2 Underweight   18.5-24.9 kg/m2 Ideal body weight   25-29.9 kg/m2 Overweight Increased incidence by 20%  30-34.9 kg/m2 Obese (Class I) Increased incidence by 68%  35-39.9 kg/m2 Severe obesity (Class II) Increased incidence by 136%  >40 kg/m2 Extreme obesity (Class III) Increased incidence by 254%   Patient's current BMI Ideal Body weight  Body mass index is 17.51 kg/m. Ideal body weight: 73  kg (160 lb 15 oz)   BMI Readings from Last 4 Encounters:  06/14/22 17.51 kg/m  05/04/22 14.92 kg/m  04/03/22 14.92 kg/m  01/06/22 19.51 kg/m   Wt Readings from Last 4 Encounters:  06/14/22 122 lb (55.3 kg)  05/04/22 104 lb (47.2 kg)  04/03/22 104 lb (47.2 kg)  01/06/22 136 lb (61.7 kg)    Psych/Mental status: Alert, oriented x 3 (person, place, & time)       Eyes: PERLA Respiratory: No evidence of acute respiratory distress  Thoracic Spine Area Exam  Skin & Axial Inspection: Paravertebral muscle atrophy Alignment: Asymmetric Functional ROM: Pain restricted ROM Stability: No instability detected Muscle Tone/Strength: Functionally intact. No obvious neuro-muscular anomalies detected. Sensory (Neurological): Musculoskeletal pain pattern Muscle strength & Tone: No palpable  anomalies  Lumbar Spine Area Exam  Skin & Axial Inspection: Well healed scar from previous spine surgery detected Alignment: Symmetrical Functional ROM: Decreased ROM       Stability: No instability detected Muscle Tone/Strength: Increased muscle tone over affected area Sensory (Neurological): Musculoskeletal pain pattern  Gait & Posture Assessment  Ambulation: Patient came in today in a wheel chair Gait: Significantly limited. Dependent on assistive device to ambulate Posture: Difficulty standing up straight, due to pain  Lower Extremity Exam    Side: Right lower extremity  Side: Left lower extremity  Stability: No instability observed          Stability: No instability observed          Skin & Extremity Inspection: Skin color, temperature, and hair growth are WNL. No peripheral edema or cyanosis. No masses, redness, swelling, asymmetry, or associated skin lesions. No contractures.  Skin & Extremity Inspection: Skin color, temperature, and hair growth are WNL. No peripheral edema or cyanosis. No masses, redness, swelling, asymmetry, or associated skin lesions. No contractures.  Functional ROM: Pain  restricted ROM for all joints of the lower extremity          Functional ROM: Pain restricted ROM for all joints of the lower extremity          Muscle Tone/Strength: Generalized lower extremity weakness  Muscle Tone/Strength: Generalized lower extremity weakness  Sensory (Neurological): Neurogenic pain pattern        Sensory (Neurological): Neurogenic pain pattern        DTR: Patellar: deferred today Achilles: deferred today Plantar: deferred today  DTR: Patellar: deferred today Achilles: deferred today Plantar: deferred today  Palpation: No palpable anomalies  Palpation: No palpable anomalies   4- out of 5 strength bilateral lower extremity: Plantar flexion, dorsiflexion, knee flexion, knee extension.   Assessment  Primary Diagnosis & Pertinent Problem List: The primary encounter diagnosis was Chronic pain syndrome. Diagnoses of ALS (amyotrophic lateral sclerosis) ([place]), Compression fracture of L2 vertebra, initial encounter [facility]), Closed compression fracture of body of L1 vertebra ([place]), and Pain in both lower legs were also pertinent to this visit.  Visit Diagnosis (New problems to examiner): 1. Chronic pain syndrome   2. ALS (amyotrophic lateral sclerosis) ([place])   3. Compression fracture of L2 vertebra, initial encounter ([place])   4. Closed compression fracture of body of L1 vertebra (HCC)   5. Pain in both lower legs    Plan of Care (Initial workup plan)  Note: Mr. Hodgman was reminded that as per protocol, today's visit has been an evaluation only. We have not taken over the patient's controlled substance management.  Palliative care.  Chronic pain secondary to lumbar compression fractures with the most recent one being in July at L1 that resulted in 25% vertebral height loss.  He has a history of L2 compression fracture status post kyphoplasty in the past.  Not a surgical candidate.  Has found benefit with hydrocodone in the past and is interested in pharmacologic management for  the management of his chronic pain.  Of note patient is very weak throughout.  He has shortness of breath and has trouble talking suggesting diaphragmatic involvement of ALS.  Urine toxicology screen as below.  Patient informed me that he utilized [facility] last month.  He is no longer intaking THC in any capacity.  We will consider patient for hydrocodone for chronic pain management/palliative care.   Lab Orders         Compliance Drug Analysis, Ur  Pharmacological management options:  Opioid Analgesics: The patient was informed that there is no guarantee that he would be a candidate for opioid analgesics. The decision will be made following CDC guidelines. This decision will be based on the results of diagnostic studies, as well as Mr. Merkin's risk profile.   Membrane stabilizer:  Continue gabapentin 300 mg 3 times daily.  Future considerations include Lyrica, Cymbalta  Muscle relaxant: Adequate regimen baclofen 5 mg to 10 mg 3 times daily as needed  NSAID: To be determined at a later time  Other analgesic(s): To be determined at a later time       Provider-requested follow-up: Return in about 2 weeks (around 06/28/2022) for Medication Management, in person.  I spent a total of 60 minutes reviewing chart data, face-to-face evaluation with the patient, counseling and coordination of care as detailed above.   Future Appointments  Date Time Provider [place]  06/28/2022  1:00 PM Gillis Santa, MD [facility] None    Note by: Gillis Santa, MD Date: 06/14/2022; Time: 2:51 PM

## 2022-06-14 NOTE — Progress Notes (Signed)
Safety precautions to be maintained throughout the outpatient stay will include: orient to surroundings, keep bed in low position, maintain call bell within reach at all times, provide assistance with transfer out of bed and ambulation.  

## 2022-06-17 LAB — COMPLIANCE DRUG ANALYSIS, UR

## 2022-06-19 ENCOUNTER — Ambulatory Visit (INDEPENDENT_AMBULATORY_CARE_PROVIDER_SITE_OTHER): Payer: Medicare Other

## 2022-06-19 ENCOUNTER — Ambulatory Visit
Admission: EM | Admit: 2022-06-19 | Discharge: 2022-06-19 | Disposition: A | Discharge status: Home / Self Care | Payer: Medicare Other

## 2022-06-19 ENCOUNTER — Encounter: Payer: Self-pay | Admitting: Emergency Medicine

## 2022-06-19 DIAGNOSIS — G8929 Other chronic pain: Secondary | ICD-10-CM

## 2022-06-19 DIAGNOSIS — M545 Low back pain, unspecified: Secondary | ICD-10-CM

## 2022-06-19 DIAGNOSIS — R109 Unspecified abdominal pain: Secondary | ICD-10-CM | POA: Diagnosis not present

## 2022-06-19 DIAGNOSIS — Z931 Gastrostomy status: Secondary | ICD-10-CM

## 2022-06-19 DIAGNOSIS — G1221 Amyotrophic lateral sclerosis: Secondary | ICD-10-CM

## 2022-06-19 MED ORDER — HYDROCODONE-ACETAMINOPHEN 5-325 MG PO TABS: 1.0000 | ORAL_TABLET | Freq: Four times a day (QID) | ORAL | 0 refills | Status: DC | PRN

## 2022-06-19 NOTE — Discharge Instructions (Signed)
-  Norco. Take every 6 hours as needed for pain not relieve by ibuprofen or Tylenol.  -Be careful with taking both the Norco and Tylenol as they both contain acetaminophen.  Want to keep your intake over 24 hours less than 4 g. -Continue your home gabapentin and baclofen as these may also help -Keep your appointment with Dr. Holley Raring -You will need to follow-up with your other provider in regards to the G tube leaking.

## 2022-06-19 NOTE — ED Triage Notes (Addendum)
Pt presents with worsening back pain x 2 days. Pt fell in July and fractured his L1 vertebra. He was given a back support belt. He is unable to wear his belt due to a g-tube being placed in August. Pt denies taking any pain relief for his back pain.

## 2022-06-19 NOTE — ED Provider Notes (Signed)
[facility]    CSN: [id number] Arrival date & time: 06/19/22  1115      History   Chief Complaint Chief Complaint  Patient presents with   Back Pain    HPI Shawn GamblesRoger E Bathe is a 61 y.o. male.   Patient is a 61 year old male with past medical history of ALS and lumbar compression fractures who presents with chief complaint of low back pain.  Patient had a fall back in July with a noted acute compression fracture of L1 with 25% height loss.  Patient also has a history of L2 compression fracture in the past as well.  Patient also reports his G-tube is leaking as well.  Patient lives alone and is able to take care of most of his daily needs.  He is seen by physical therapy and speech therapy in his home.  Patient's tube was replaced back in August.  Patient reports back pain is typically off-and-on but is much worse yesterday and today.  He does have a support band but states he is unable to wear it due to his G-tube positioning.  Upon chart review looks like patient was seen by neurosurgery in August with no new recommendations other than wearing the band as he can to help with muscle soreness as well as to provide recommendation for pain management.  Patient was seen by Dr. Cherylann RatelLateef on September 13 for possible pain management initiation.    Past Medical History:  Diagnosis Date   ALS (amyotrophic lateral sclerosis) (HCC)    Asthma    Coronary artery disease    Hyperlipidemia    Renal stone    Stroke [facility](HCC)     Patient Active Problem List   Diagnosis Date Noted   Closed compression fracture of body of L1 vertebra (HCC) 04/03/2022   Dyslipidemia 04/03/2022   Right leg pain 11/13/2021   COVID-19 virus infection 11/13/2021   Acute hypoxemic respiratory failure due to COVID-19 (HCC) 11/13/2021   Acute respiratory failure with hypoxia (HCC) 11/12/2021   Compression fracture of L2 (HCC) 11/07/2021   Lumbago 11/07/2021   Pain in both lower legs 11/07/2021   ALS  (amyotrophic lateral sclerosis) (HCC) 11/07/2021   Asthma, chronic 11/07/2021   Ureteral stone 05/20/2015    Past Surgical History:  Procedure Laterality Date   CARDIAC CATHETERIZATION  2006   negative   CYSTOSCOPY W/ URETERAL STENT REMOVAL Left 06/11/2015   Procedure: CYSTOSCOPY WITH STENT EXCHANGE;  Surgeon: Malen GauzePatrick L McKenzie, MD;  Location: [facility];  Service: Urology;  Laterality: Left;   CYSTOSCOPY WITH STENT PLACEMENT Left 05/20/2015   Procedure: CYSTOSCOPY WITH STENT PLACEMENT;  Surgeon: Crist FatBenjamin W Herrick, MD;  Location: [facility];  Service: Urology;  Laterality: Left;   CYSTOSCOPY/RETROGRADE/URETEROSCOPY/STONE EXTRACTION WITH BASKET  2000   pt. unsure of which side done   CYSTOSCOPY/URETEROSCOPY/HOLMIUM LASER Left 06/11/2015   Procedure: CYSTOSCOPY/URETEROSCOPY/HOLMIUM LASER;  Surgeon: Malen GauzePatrick L McKenzie, MD;  Location: [facility];  Service: Urology;  Laterality: Left;   IR KYPHO LUMBAR INC FX REDUCE BONE BX UNI/BIL CANNULATION INC/IMAGING  01/06/2022   KNEE SURGERY Right 84,96, 2003   x3   SHOULDER SURGERY Left 2010       Home Medications    Prior to Admission medications   Medication Sig Start Date End Date Taking? Authorizing Provider  HYDROcodone-acetaminophen (NORCO/VICODIN) 5-325 MG tablet Take 1 tablet by mouth every 6 (six) hours as needed. 06/19/22  Yes Candis SchatzHarris, Earnie Bechard D, PA-C  Phenylbutyrate-Taurursodiol (RELYVRIO) 3-1 g PACK MIX AND TAKE 1  PACKET DAILY FOR 3 WEEKS, THEN 1 PACKET IN THE MORNING AND 1 PACKET AT NIGHT THEREAFTER. 06/16/22  Yes [provider]  acetaminophen (TYLENOL) 160 MG/5ML solution Take 480 mg by mouth every 6 (six) hours as needed for mild pain or moderate pain.    [provider]  albuterol (VENTOLIN HFA) 108 (90 Base) MCG/ACT inhaler Inhale 1-2 puffs into the lungs every 6 (six) hours as needed for wheezing or shortness of breath.    [provider]  atorvastatin (LIPITOR) 20 MG tablet Take 20 mg by mouth daily. 06/25/20    [provider]  Baclofen 5 MG TABS Take 10 mg by mouth 3 (three) times daily.    [provider]  fluticasone furoate-vilanterol (BREO ELLIPTA) 100-25 MCG/ACT AEPB Inhale 1 puff into the lungs in the morning.    [provider]  gabapentin (NEURONTIN) 300 MG capsule Take 300 mg by mouth 3 (three) times daily. 02/07/22   [provider]  montelukast (SINGULAIR) 10 MG tablet Take 10 mg by mouth at bedtime.    [provider]  riluzole (RILUTEK) 50 MG tablet Take 50 mg by mouth 2 (two) times daily.    [provider]  traZODone (DESYREL) 100 MG tablet Take 100 mg by mouth at bedtime.    [provider]    Family History Family History  Problem Relation Age of Onset   Cancer Mother    Heart failure Mother    Hypertension Mother    CVA Mother    Diabetes Other    Other Father        unknown medical history    Social History Social History   Tobacco Use   Smoking status: Never   Smokeless tobacco: Never  Vaping Use   Vaping Use: Never used  Substance Use Topics   Alcohol use: Not Currently    Alcohol/week: 0.0 standard drinks of alcohol    Comment: occasionally   Drug use: Never     Allergies   Aspirin, Compazine [prochlorperazine], Morphine and related, Naproxen, Penicillins, and Tramadol   Review of Systems Review of Systems as noted in HPI.  Other systems reviewed and found to be negative   Physical Exam Triage Vital Signs ED Triage Vitals  Enc Vitals Group     BP 06/19/22 1203 112/82     Pulse Rate 06/19/22 1203 73     Resp 06/19/22 1203 16     Temp 06/19/22 1203 98.2 F (36.8 C)     Temp Source 06/19/22 1203 Oral     SpO2 06/19/22 1203 98 %     Weight --      Height --      Head Circumference --      Peak Flow --      Pain Score 06/19/22 1200 10     Pain Loc --      Pain Edu? --      Excl. in GC? --    No data found.  Updated Vital Signs BP 112/82 (BP Location: Left Arm)   Pulse 73    Temp 98.2 F (36.8 C) (Oral)   Resp 16   SpO2 98%   Visual Acuity Right Eye Distance:   Left Eye Distance:   Bilateral Distance:    Right Eye Near:   Left Eye Near:    Bilateral Near:     Physical Exam Constitutional:      Comments: Patient appears chronically ill thin, slow shuffling gait, walking with  a walker  HENT:     Head: Normocephalic and atraumatic.  Musculoskeletal:     Comments: Tenderness across the lower back to both muscle and spinal minimal palpation.   Skin:    General: Skin is warm.     Capillary Refill: Capillary refill takes less than 2 seconds.  Neurological:     General: No focal deficit present.     Mental Status: He is oriented to person, place, and time.      UC Treatments / Results  Labs (all labs ordered are listed, but only abnormal results are displayed) Labs Reviewed - No data to display  EKG   Radiology DG Abd 2 Views  Result Date: 06/19/2022 CLINICAL DATA:  Evaluate G-tube location, back pain EXAM: ABDOMEN - 2 VIEW COMPARISON:  06/04/2015 FINDINGS: Bowel gas pattern is nonspecific. Peg tube is noted with its tip in upper abdomen. Without contrast injection, exact location of the tip could not be evaluated. No abnormal masses or calcifications are seen. There is decrease in height of the bodies of L1 and L2 vertebrae with previous vertebroplasty in L2 vertebra. IMPRESSION: Peg tube is noted with its tip in upper abdomen. Without contrast injection, exact location of the tip could not be evaluated. Bowel gas pattern is nonspecific. Electronically Signed   By: Elmer Picker M.D.   On: 06/19/2022 13:19    Procedures Procedures (including critical care time)  Medications Ordered in UC Medications - No data to display  Initial Impression / Assessment and Plan / UC Course  I have reviewed the triage vital signs and the nursing notes.  Pertinent labs & imaging results that were available during my care of the patient were reviewed by  me and considered in my medical decision making (see chart for details).    Patient with past medical history of ALS as well as recent fall in July with an L1 compression fracture with 25% height loss.  Patient has been seen by neurosurgery who felt patient did not have any surgical needs at the time.  Patient was seen last week by Dr. Zollie Scale with pain management.  Patient was getting a urine screen prior to starting his pain management program.  Patient complaining of both of back pain as well as leaking from his G-tube.  Two-view abdominal x-ray shows the L1 and L2 compression fractures.  His G-tube has a tip in the left upper quadrant but without contrast cannot confirm placement.  No overt pneumoperitoneum noted.  Patient is already on baclofen and nightly gabapentin as an outpatient contacted Dr. Zollie Scale by secure chat and he states that patient is not currently on anything for pain and I am okay to give him the appropriate prescription and that he will take over once he sees the patient again in clinic in a couple weeks.  We will give patient a prescription for Norco.  Advised him to take that for pain that is not relieved by ibuprofen or Tylenol.  Have him follow-up with Dr. Zollie Scale.  In regards to his G-tube, have him contact the provider who placed the G-tube.  Final Clinical Impressions(s) / UC Diagnoses   Final diagnoses:  Chronic low back pain without sciatica, unspecified back pain laterality  ALS (amyotrophic lateral sclerosis) (HCC)  Gastrointestinal tube in situ [facility])     Discharge Instructions      -Norco. Take every 6 hours as needed for pain not relieve by ibuprofen or Tylenol.  -Be careful with taking both the Norco and Tylenol  as they both contain acetaminophen.  Want to keep your intake over 24 hours less than 4 g. -Continue your home gabapentin and baclofen as these may also help -Keep your appointment with Dr. Cherylann Ratel -You will need to follow-up with your other provider in  regards to the G tube leaking.     ED Prescriptions     Medication Sig Dispense Auth. Provider   HYDROcodone-acetaminophen (NORCO/VICODIN) 5-325 MG tablet Take 1 tablet by mouth every 6 (six) hours as needed. 10 tablet Candis Schatz, PA-C      I have reviewed the PDMP during this encounter.   Candis Schatz, PA-C 06/19/22 1342

## 2022-06-20 ENCOUNTER — Other Ambulatory Visit: Payer: Self-pay | Admitting: Nurse Practitioner

## 2022-06-20 DIAGNOSIS — R1310 Dysphagia, unspecified: Secondary | ICD-10-CM

## 2022-06-20 DIAGNOSIS — R636 Underweight: Secondary | ICD-10-CM

## 2022-06-20 DIAGNOSIS — G1221 Amyotrophic lateral sclerosis: Secondary | ICD-10-CM

## 2022-06-28 ENCOUNTER — Ambulatory Visit
Payer: Medicare Other | Attending: Student in an Organized Health Care Education/Training Program | Admitting: Student in an Organized Health Care Education/Training Program

## 2022-06-28 ENCOUNTER — Encounter: Payer: Self-pay | Admitting: Student in an Organized Health Care Education/Training Program

## 2022-06-28 VITALS — BP 110/77 | HR 96 | Temp 97.3°F | Ht 70.0 in | Wt 122.0 lb

## 2022-06-28 DIAGNOSIS — G894 Chronic pain syndrome: Secondary | ICD-10-CM

## 2022-06-28 DIAGNOSIS — Z0289 Encounter for other administrative examinations: Secondary | ICD-10-CM

## 2022-06-28 DIAGNOSIS — M79661 Pain in right lower leg: Secondary | ICD-10-CM | POA: Diagnosis present

## 2022-06-28 DIAGNOSIS — M79662 Pain in left lower leg: Secondary | ICD-10-CM | POA: Diagnosis present

## 2022-06-28 DIAGNOSIS — G1221 Amyotrophic lateral sclerosis: Secondary | ICD-10-CM

## 2022-06-28 DIAGNOSIS — S32010A Wedge compression fracture of first lumbar vertebra, initial encounter for closed fracture: Secondary | ICD-10-CM | POA: Diagnosis present

## 2022-06-28 DIAGNOSIS — S32020A Wedge compression fracture of second lumbar vertebra, initial encounter for closed fracture: Secondary | ICD-10-CM | POA: Diagnosis present

## 2022-06-28 MED ORDER — BELBUCA 300 MCG BU FILM: 1.0000 | ORAL_FILM | Freq: Two times a day (BID) | BUCCAL | 0 refills | Status: DC

## 2022-06-28 NOTE — Progress Notes (Signed)
PROVIDER NOTE: Information contained herein reflects review and annotations entered in association with encounter. Interpretation of such information and data should be left to medically-trained personnel. Information provided to patient can be located elsewhere in the medical record under "Patient Instructions". Document created using STT-dictation technology, any transcriptional errors that may result from process are unintentional.    Patient: Shawn [name]  Service Category: E/M  Provider: Gillis Santa, MD  DOB: 1961/09/15  DOS: 06/28/2022  Referring Provider: No ref. provider found  MRN: [id number]  Specialty: Interventional Pain Management  PCP: Daniel  Type: Established Patient  Setting: Ambulatory outpatient    Location: Office  Delivery: Face-to-face     Primary Reason(s) for Visit: Encounter for evaluation before starting new chronic pain management plan of care (Level of risk: moderate) CC: Back Pain (lower)  HPI  Shawn [name] is a 61 y.o. year old, male patient, who comes today for a follow-up evaluation to review the test results and decide on a treatment plan. He has Ureteral stone; Compression fracture of L2 ([employer]); Lumbago; Pain in both lower legs; ALS (amyotrophic lateral sclerosis) ([place]); Asthma, chronic; Acute respiratory failure with hypoxia ([place]); Right leg pain; COVID-19 virus infection; Acute hypoxemic respiratory failure due to COVID-19 [facility]); Closed compression fracture of body of L1 vertebra ([place]); Dyslipidemia; Chronic pain syndrome; and Pain management contract signed on their problem list. His primarily concern today is the Back Pain (lower)  Pain Assessment: Location: Right, Left Back Radiating: Denies Onset: 1 to 4 weeks ago Duration: Chronic pain Quality: Aching, Burning, Constant, Throbbing, Stabbing Severity: 9 /10 (subjective, self-reported pain score)  Effect on ADL: limits my daily activities Timing: Constant Modifying factors: Meds  and laying down BP: 110/77  HR: 96  Shawn [name] comes in today for a follow-up visit after his initial evaluation on 06/14/2022.   No significant change in his medical history other than he went to the urgent care on 06/19/2022 for increased low back pain as a result of his compression fractures.  Today was prescribed a small quantity of hydrocodone. Shawn [name] was honest with me before and told me that his urine toxicology screen will be positive for THC.  He states that he has since stopped utilizing THC.  We will obtain a another urine toxicology screen at his next visit.  This should be negative for THC.   We discussed hydrocodone versus buprenorphine for chronic pain management.  I recommend a trial of buprenorphine, belbuca as this is safer, has a longer duration of analgesic effect and has less overall side effects.  We will hope to get preauthorization for this.   HPI from initial clinic visit: Shawn [name] is a pleasant 61 year old male who presents with a chief complaint of low back pain.  He has a history of L1 and L2 compression fracture.  His L1 compression fracture occurred on 04/05/2022 after a fall.  He has a history of L2 compression fracture status post kyphoplasty.  He does have a history of ALS unfortunately.  He has progressive shortness of breath, difficulty walking and severe weakness.  He recently had a G-tube placed last month.  He is here primarily for medication management/palliative care of chronic low back pain secondary to L1 compression fracture.  He states that he has found benefit with hydrocodone compared to oxycodone.  He was very honest with me and states that his urine toxicology screen will be positive for THC as he did smoke last month.  He states that he is  no longer utilizing THC.  Further details on both, my assessment(s), as well as the proposed treatment plan, please see below.  Controlled Substance Pharmacotherapy Assessment REMS (Risk Evaluation and Mitigation  Strategy)  Opioid Analgesic: Trial of buprenorphine, belbuca as below Pill Count: None expected due to no prior prescriptions written by our practice. Shawn Fischer, RN  06/28/2022 12:56 PM  Sign when Signing Visit Nursing Pain Medication Assessment:  Safety precautions to be maintained throughout the outpatient stay will include: orient to surroundings, keep bed in low position, maintain call bell within reach at all times, provide assistance with transfer out of bed and ambulation.  Medication Inspection Compliance: Pill count conducted under aseptic conditions, in front of the patient. Neither the pills nor the bottle was removed from the patient's sight at any time. Once count was completed pills were immediately returned to the patient in their original bottle.  Medication: Hydrocodone/APAP Pill/Patch Count:  2 of 10 pills remain Pill/Patch Appearance: Markings consistent with prescribed medication Bottle Appearance: Standard pharmacy container. Clearly labeled. Filled Date: 48 / 70 / 2023 Last Medication intake:  YesterdaySafety precautions to be maintained throughout the outpatient stay will include: orient to surroundings, keep bed in low position, maintain call bell within reach at all times, provide assistance with transfer out of bed and ambulation.   Ordered by Dr Kenton Kingfisher   Pharmacokinetics: Liberation and absorption (onset of action): WNL Distribution (time to peak effect): WNL Metabolism and excretion (duration of action): WNL         Pharmacodynamics: Desired effects: Analgesia: Shawn [name] reports >50% benefit. Functional ability: Patient reports that medication allows him to accomplish basic ADLs Clinically meaningful improvement in function (CMIF): Sustained CMIF goals met Perceived effectiveness: Described as relatively effective, allowing for increase in activities of daily living (ADL) Undesirable effects: Side-effects or Adverse reactions: None  reported Monitoring: [place] PMP: PDMP not reviewed this encounter. Online review of the past 32-monthperiod previously conducted. Not applicable at this point since we have not taken over the patient's medication management yet. List of other Serum/Urine Drug Screening Test(s):  No results found for: "AMPHSCRSER", "BARBSCRSER", "BENZOSCRSER", "COCAINSCRSER", "COCAINSCRNUR", "PCPSCRSER", "THCSCRSER", "THCU", "CANNABQUANT", "OPIATESCRSER", "OXYSCRSER", "PROPOXSCRSER", "ETH", "CBDTHCR", "D8THCCBX", "D9THCCBX" List of all UDS test(s) done:  Lab Results  Component Value Date   SUMMARY Note 06/14/2022   Last UDS on record: Summary  Date Value Ref Range Status  06/14/2022 Note  Final    Comment:    ==================================================================== Compliance Drug Analysis, Ur ==================================================================== Test                             Result       Flag       Units  Drug Present and Declared for Prescription Verification   Gabapentin                     PRESENT      EXPECTED   Baclofen                       PRESENT      EXPECTED  Drug Present not Declared for Prescription Verification   Carboxy-THC                    262          UNEXPECTED ng/mg creat    Carboxy-THC is a metabolite of tetrahydrocannabinol (THC). Source of  THC is most commonly herbal marijuana or marijuana-based products,    but THC is also present in a scheduled prescription medication.    Trace amounts of THC can be present in hemp and cannabidiol (CBD)    products. This test is not intended to distinguish between delta-9-    tetrahydrocannabinol, the predominant form of THC in most herbal or    marijuana-based products, and delta-8-tetrahydrocannabinol.  Drug Absent but Declared for Prescription Verification   Trazodone                      Not Detected UNEXPECTED   Acetaminophen                  Not Detected UNEXPECTED    Acetaminophen, as indicated in the  declared medication list, is not    always detected even when used as directed.  ==================================================================== Test                      Result    Flag   Units      Ref Range   Creatinine              34               mg/dL      >=20 ==================================================================== Declared Medications:  The flagging and interpretation on this report are based on the  following declared medications.  Unexpected results may arise from  inaccuracies in the declared medications.   **Note: The testing scope of this panel includes these medications:   Baclofen  Gabapentin (Neurontin)  Trazodone (Desyrel)   **Note: The testing scope of this panel does not include small to  moderate amounts of these reported medications:   Acetaminophen (Tylenol)   **Note: The testing scope of this panel does not include the  following reported medications:   Albuterol (Ventolin HFA)  Atorvastatin (Lipitor)  Fluticasone (Breo)  Montelukast (Singulair)  Riluzole  Vilanterol (Breo) ==================================================================== For clinical consultation, please call [phone]. ====================================================================    UDS interpretation:  UDS positive for THC, this was discussed with patient.  He states that he has since discontinued.           Medication Assessment Form: Patient introduced to form today Treatment compliance: Not applicable Risk Assessment Profile: Aberrant behavior: See initial evaluations. None observed or detected today Comorbid factors increasing risk of overdose: See initial evaluation. No additional risks detected today Opioid risk tool (ORT):      No data to display          ORT Scoring interpretation table:  Score <3 = Low Risk for SUD  Score between 4-7 = Moderate Risk for SUD  Score >8 = High Risk for Opioid Abuse   Risk of substance use disorder  (SUD): Low  Risk Mitigation Strategies:  Patient opioid safety counseling: Completed today. Counseling provided to patient as per "Patient Counseling Document". Document signed by patient, attesting to counseling and understanding Patient-Prescriber Agreement (PPA): Obtained today.  Controlled substance notification to other providers: Written and sent today.  Pharmacologic Plan: Today we may be taking over the patient's pharmacological regimen. See below.             Laboratory Chemistry Profile   Renal Lab Results  Component Value Date   BUN 13 04/04/2022   CREATININE 0.50 (L) 04/04/2022   GFRAA >60 05/20/2015   GFRNONAA >60 04/04/2022   PROTEINUR NEGATIVE 05/19/2021     Electrolytes  Lab Results  Component Value Date   NA 138 04/04/2022   K 3.8 04/04/2022   CL 112 (H) 04/04/2022   CALCIUM 8.5 (L) 04/04/2022     Hepatic Lab Results  Component Value Date   AST 28 04/03/2022   ALT 33 04/03/2022   ALBUMIN 4.4 04/03/2022   ALKPHOS 101 04/03/2022   LIPASE 27 05/20/2015     ID Lab Results  Component Value Date   HIV Non Reactive 11/13/2021   SARSCOV2NAA POSITIVE (A) 11/12/2021   STAPHAUREUS NEGATIVE 05/20/2015   MRSAPCR NEGATIVE 05/20/2015     Bone No results found for: "VD25OH", "VD125OH2TOT", "AX0940HW8", "GS8110RP5", "25OHVITD1", "25OHVITD2", "94VOPFYT2", "TESTOFREE", "TESTOSTERONE"   Endocrine Lab Results  Component Value Date   GLUCOSE 99 04/04/2022   GLUCOSEU NEGATIVE 05/19/2021   TSH 1.015 11/12/2021     Neuropathy Lab Results  Component Value Date   HIV Non Reactive 11/13/2021     CNS No results found for: "COLORCSF", "APPEARCSF", "RBCCOUNTCSF", "WBCCSF", "POLYSCSF", "LYMPHSCSF", "EOSCSF", "PROTEINCSF", "GLUCCSF", "JCVIRUS", "CSFOLI", "IGGCSF", "LABACHR", "ACETBL"   Inflammation (CRP: Acute  ESR: Chronic) No results found for: "CRP", "ESRSEDRATE", "LATICACIDVEN"   Rheumatology No results found for: "RF", "ANA", "LABURIC", "URICUR",  "LYMEIGGIGMAB", "LYMEABIGMQN", "HLAB27"   Coagulation Lab Results  Component Value Date   INR 1.0 01/06/2022   LABPROT 12.9 01/06/2022   PLT 194 04/04/2022     Cardiovascular Lab Results  Component Value Date   CKTOTAL 79 11/07/2021   HGB 12.1 (L) 04/04/2022   HCT 35.9 (L) 04/04/2022     Screening Lab Results  Component Value Date   SARSCOV2NAA POSITIVE (A) 11/12/2021   STAPHAUREUS NEGATIVE 05/20/2015   MRSAPCR NEGATIVE 05/20/2015   HIV Non Reactive 11/13/2021     Cancer No results found for: "CEA", "CA125", "LABCA2"   Allergens No results found for: "ALMOND", "APPLE", "ASPARAGUS", "AVOCADO", "BANANA", "BARLEY", "BASIL", "BAYLEAF", "GREENBEAN", "LIMABEAN", "WHITEBEAN", "BEEFIGE", "REDBEET", "BLUEBERRY", "BROCCOLI", "CABBAGE", "MELON", "CARROT", "CASEIN", "CASHEWNUT", "CAULIFLOWER", "CELERY"     Note: Lab results reviewed.  Recent Diagnostic Imaging Review  Cervical Imaging: Cervical MR wo contrast: No results found for this or any previous visit.  Cervical MR wo contrast: No valid procedures specified. Cervical CT wo contrast: Results for orders placed during the hospital encounter of 04/03/22  CT Cervical Spine Wo Contrast  Narrative CLINICAL DATA:  Pt had a fall at home, pt c/o back and R elbow pain. States that he was walking and his walker folded up on him and he fell backwards, pt states he landed on his back and hit the back of head  EXAM: CT HEAD WITHOUT CONTRAST  CT CERVICAL SPINE WITHOUT CONTRAST  TECHNIQUE: Multidetector CT imaging of the head and cervical spine was performed following the standard protocol without intravenous contrast. Multiplanar CT image reconstructions of the cervical spine were also generated.  RADIATION DOSE REDUCTION: This exam was performed according to the departmental dose-optimization program which includes automated exposure control, adjustment of the mA and/or kV according to patient size and/or use of iterative  reconstruction technique.  COMPARISON:  CT head and cervical spine 10/18/2020  FINDINGS: CT HEAD FINDINGS  Brain:  No evidence of large-territorial acute infarction. No parenchymal hemorrhage. No mass lesion. No extra-axial collection.  No mass effect or midline shift. No hydrocephalus. Basilar cisterns are patent.  Vascular: No hyperdense vessel.  Skull: No acute fracture or focal lesion.  Sinuses/Orbits: Paranasal sinuses and mastoid air cells are clear. The orbits are unremarkable.  Other: None.  CT CERVICAL SPINE FINDINGS  Alignment: Normal.  Skull base and vertebrae: C5-C6 intervertebral disc space narrowing, osteophyte formation, posterior disc osteophyte complex formation. No acute fracture. No aggressive appearing focal osseous lesion or focal pathologic process.  Soft tissues and spinal canal: No prevertebral fluid or swelling. No visible canal hematoma.  Upper chest: Unremarkable.  Other: None.  IMPRESSION: 1. No acute intracranial abnormality. 2. No acute displaced fracture or traumatic listhesis of the cervical spine.   Electronically Signed By: Iven Finn M.D. On: 04/03/2022 18:32    Narrative CLINICAL DATA:  Pain status post fall  EXAM: LEFT SHOULDER - 2+ VIEW  COMPARISON:  None.  FINDINGS: There is no evidence of fracture or dislocation. There is no evidence of arthropathy or other focal bone abnormality. Soft tissues are unremarkable. There are old healed left-sided rib fractures.  IMPRESSION: Negative.   Electronically Signed By: Constance Holster M.D. On: 07/28/2019 17:46   MR LUMBAR SPINE WO CONTRAST  Narrative CLINICAL DATA:  61 year old male status post fall with low back pain. History of augmented L2 compression fracture.  EXAM: MRI LUMBAR SPINE WITHOUT CONTRAST  TECHNIQUE: Multiplanar, multisequence MR imaging of the lumbar spine was performed. No intravenous contrast was administered.  COMPARISON:   Lumbar MRI 11/07/2021.  Lumbar spine CT yesterday.  FINDINGS: Segmentation: Normal by CT, the same numbering system used on the previous MRI.  Alignment: Lumbar lordosis has not significantly changed since February.  Vertebrae: Augmented L2 compression fracture with no residual marrow edema at that level. L1 compression fracture with mostly superior endplate deformity and approximately 25% loss of vertebral body height associated with moderate marrow edema. Edema mildly tracks into both pedicles, but otherwise the L1 posterior elements appear intact. Minimal retropulsion of bone (series 8, image 7).  No other marrow edema or evidence of acute osseous abnormality. Background bone marrow signal within normal limits. Intact visible sacrum.  Conus medullaris and cauda equina: Conus extends to the T12-L1 level. No lower spinal cord or conus signal abnormality.  Paraspinal and other soft tissues: Mild if any paraspinal edema or hematoma at L1. Otherwise negative.  Disc levels:  Unchanged mild for age under lying lumbar spine degeneration. No significant spinal stenosis. No T12 or L1 foraminal stenosis.  IMPRESSION: 1. Acute to subacute L1 compression fracture with 25% loss of vertebral body height. Minimal retropulsion, and no spinal stenosis or other complicating features.  2. Satisfactory augmented L2 compression fracture. Mild for age lumbar spine degeneration.   Electronically Signed By: Genevie Ann M.D. On: 04/04/2022 12:43  Narrative CLINICAL DATA:  Back trauma, no prior imaging (Age >= 16y). Pt had a fall at home, pt c/o back and R elbow pain. States that he was walking and his walker folded up on him and he fell backwards, pt states he landed on his back and hit the back of head.  EXAM: CT LUMBAR SPINE WITHOUT CONTRAST  TECHNIQUE: Multidetector CT imaging of the lumbar spine was performed without intravenous contrast administration. Multiplanar CT  image reconstructions were also generated.  RADIATION DOSE REDUCTION: This exam was performed according to the departmental dose-optimization program which includes automated exposure control, adjustment of the mA and/or kV according to patient size and/or use of iterative reconstruction technique.  COMPARISON:  MRI lumbar spine 11/07/2021, x-ray lumbar spine 12/29/2021  FINDINGS: Segmentation: 5 lumbar type vertebrae.  Alignment: Normal.  Vertebrae: Chronic L2 compression fracture status post kyphoplasty. Interval development of an L1 compression fracture with at least mid 25% vertebral body height loss.  Paraspinal and  other soft tissues: Negative.  Disc levels: Maintained.  Other: 2 mm calcified stone in the left kidney. Atherosclerotic plaque.  IMPRESSION: 1. Interval development of an L1 compression fracture with at least mid 25% vertebral body height loss. 2. Chronic L2 compression fracture status post kyphoplasty. 3. Nonobstructive 2 mm left nephrolithiasis. 4.  Aortic Atherosclerosis (ICD10-I70.0).   Electronically Signed By: Iven Finn M.D. On: 04/03/2022 18:40   DG HIP UNILAT WITH PELVIS 2-3 VIEWS RIGHT  Narrative CLINICAL DATA:  Pain status post fall  EXAM: DG HIP (WITH OR WITHOUT PELVIS) 2-3V RIGHT  COMPARISON:  None.  FINDINGS: There is no evidence of hip fracture or dislocation. There is no evidence of arthropathy or other focal bone abnormality.  IMPRESSION: Negative.   Electronically Signed By: Constance Holster M.D. On: 07/28/2019 17:49  Narrative CLINICAL DATA:  Pain status post fall  EXAM: LEFT ELBOW - COMPLETE 3+ VIEW  COMPARISON:  None.  FINDINGS: There is no evidence of fracture, dislocation, or joint effusion. There is no evidence of arthropathy or other focal bone abnormality. Soft tissues are unremarkable.  IMPRESSION: Negative.   Electronically Signed By: Constance Holster M.D. On: 07/28/2019  17:46    Narrative CLINICAL DATA:  [place], right hand and wrist pain  EXAM: RIGHT HAND - COMPLETE 3+ VIEW; RIGHT WRIST - COMPLETE 3+ VIEW  COMPARISON:  None.  FINDINGS: Right hand: Frontal, oblique, lateral views are obtained. No acute fracture, subluxation, or dislocation. Mild diffuse distal interphalangeal joint space narrowing consistent with osteoarthritis. Soft tissues are unremarkable.  Right wrist: Frontal, oblique, lateral, and ulnar deviated views of the right wrist are obtained. No fracture, subluxation, or dislocation. Joint spaces are well preserved. Soft tissues are normal.  IMPRESSION: 1. Osteoarthritis. 2. No acute displaced fracture.   Electronically Signed By: Randa Ngo M.D. On: 01/11/2021 17:05   Narrative CLINICAL DATA:  [place], right hand and wrist pain  EXAM: RIGHT HAND - COMPLETE 3+ VIEW; RIGHT WRIST - COMPLETE 3+ VIEW  COMPARISON:  None.  FINDINGS: Right hand: Frontal, oblique, lateral views are obtained. No acute fracture, subluxation, or dislocation. Mild diffuse distal interphalangeal joint space narrowing consistent with osteoarthritis. Soft tissues are unremarkable.  Right wrist: Frontal, oblique, lateral, and ulnar deviated views of the right wrist are obtained. No fracture, subluxation, or dislocation. Joint spaces are well preserved. Soft tissues are normal.  IMPRESSION: 1. Osteoarthritis. 2. No acute displaced fracture.   Electronically Signed By: Randa Ngo M.D. On: 01/11/2021 17:05  Hand-L DG Complete: Results for orders placed during the hospital encounter of 07/28/19  DG Hand Complete Left  Narrative CLINICAL DATA:  Pain status post fall  EXAM: LEFT HAND - COMPLETE 3+ VIEW  COMPARISON:  None.  FINDINGS: There is no evidence of fracture or dislocation. There is no evidence of arthropathy or other focal bone abnormality. Soft tissues are unremarkable.  IMPRESSION: Negative.   Electronically  Signed By: Constance Holster M.D. On: 07/28/2019 17:47   Complexity Note: Imaging results reviewed.                         Meds   Current Outpatient Medications:    acetaminophen (TYLENOL) 160 MG/5ML solution, Take 480 mg by mouth every 6 (six) hours as needed for mild pain or moderate pain., Disp: , Rfl:    albuterol (VENTOLIN HFA) 108 (90 Base) MCG/ACT inhaler, Inhale 1-2 puffs into the lungs every 6 (six) hours as needed for wheezing or shortness  of breath., Disp: , Rfl:    atorvastatin (LIPITOR) 20 MG tablet, Take 20 mg by mouth daily., Disp: , Rfl:    Baclofen 5 MG TABS, Take 10 mg by mouth 3 (three) times daily., Disp: , Rfl:    Buprenorphine HCl (BELBUCA) 300 MCG FILM, Place 1 Film inside cheek every 12 (twelve) hours., Disp: 60 each, Rfl: 0   fluticasone furoate-vilanterol (BREO ELLIPTA) 100-25 MCG/ACT AEPB, Inhale 1 puff into the lungs in the morning., Disp: , Rfl:    gabapentin (NEURONTIN) 300 MG capsule, Take 300 mg by mouth 3 (three) times daily., Disp: , Rfl:    montelukast (SINGULAIR) 10 MG tablet, Take 10 mg by mouth at bedtime., Disp: , Rfl:    Phenylbutyrate-Taurursodiol (RELYVRIO) 3-1 g PACK, MIX AND TAKE 1 PACKET DAILY FOR 3 WEEKS, THEN 1 PACKET IN THE MORNING AND 1 PACKET AT NIGHT THEREAFTER., Disp: , Rfl:    riluzole (RILUTEK) 50 MG tablet, Take 50 mg by mouth 2 (two) times daily., Disp: , Rfl:    traZODone (DESYREL) 100 MG tablet, Take 100 mg by mouth at bedtime., Disp: , Rfl:   ROS  Constitutional: Denies any fever or chills Gastrointestinal: No reported hemesis, hematochezia, vomiting, or acute GI distress Musculoskeletal:  Low back pain, generalized lower extremity weakness Neurological: No reported episodes of acute onset apraxia, aphasia, dysarthria, agnosia, amnesia, paralysis, loss of coordination, or loss of consciousness  Allergies  Shawn [name] is allergic to aspirin, compazine [prochlorperazine], morphine and related, naproxen, penicillins, and  tramadol.  [place]  Drug: Shawn [name]  reports no history of drug use. Alcohol:  reports that he does not currently use alcohol. Tobacco:  reports that he has never smoked. He has never used smokeless tobacco. Medical:  has a past medical history of ALS (amyotrophic lateral sclerosis) ([place]), Asthma, Coronary artery disease, Hyperlipidemia, Renal stone, and Stroke ([place]). Surgical: Shawn [name]  has a past surgical history that includes Cystoscopy with stent placement (Left, 05/20/2015); Knee surgery (Right, 84,96, 2003); Shoulder surgery (Left, 2010); Cardiac catheterization (2006); Cystoscopy/retrograde/ureteroscopy/stone extraction with basket (2000); Cystoscopy/ureteroscopy/holmium laser (Left, 06/11/2015); Cystoscopy w/ ureteral stent removal (Left, 06/11/2015); and IR KYPHO LUMBAR INC FX REDUCE BONE BX UNI/BIL CANNULATION INC/IMAGING (01/06/2022). Family: family history includes CVA in his mother; Cancer in his mother; Diabetes in an other family member; Heart failure in his mother; Hypertension in his mother; Other in his father.  Constitutional Exam  General appearance: Well nourished, well developed, and well hydrated. In no apparent acute distress Vitals:   06/28/22 1256  BP: 110/77  Pulse: 96  Temp: (!) 97.3 F (36.3 C)  SpO2: 99%  Weight: 122 lb (55.3 kg)  Height: 5' 10" (1.778 m)   BMI Assessment: Estimated body mass index is 17.51 kg/m as calculated from the following:   Height as of this encounter: 5' 10" (1.778 m).   Weight as of this encounter: 122 lb (55.3 kg).  BMI interpretation table: BMI level Category Range association with higher incidence of chronic pain  <18 kg/m2 Underweight   18.5-24.9 kg/m2 Ideal body weight   25-29.9 kg/m2 Overweight Increased incidence by 20%  30-34.9 kg/m2 Obese (Class I) Increased incidence by 68%  35-39.9 kg/m2 Severe obesity (Class II) Increased incidence by 136%  >40 kg/m2 Extreme obesity (Class III) Increased incidence by 254%   Patient's  current BMI Ideal Body weight  Body mass index is 17.51 kg/m. Ideal body weight: 73 kg (160 lb 15 oz)   BMI Readings from Last 4 Encounters:  06/28/22 17.51 kg/m  06/14/22 17.51 kg/m  05/04/22 14.92 kg/m  04/03/22 14.92 kg/m   Wt Readings from Last 4 Encounters:  06/28/22 122 lb (55.3 kg)  06/14/22 122 lb (55.3 kg)  05/04/22 104 lb (47.2 kg)  04/03/22 104 lb (47.2 kg)    Psych/Mental status: Alert, oriented x 3 (person, place, & time)       Eyes: PERLA Respiratory: No evidence of acute respiratory distress  Lumbar Spine Area Exam  Skin & Axial Inspection: Well healed scar from previous spine surgery detected Alignment: Symmetrical Functional ROM: Decreased ROM       Stability: No instability detected Muscle Tone/Strength: Increased muscle tone over affected area Sensory (Neurological): Musculoskeletal pain pattern   Gait & Posture Assessment  Ambulation: Patient came in today in a wheel chair Gait: Significantly limited. Dependent on assistive device to ambulate Posture: Difficulty standing up straight, due to pain   Lower Extremity Exam      Side: Right lower extremity   Side: Left lower extremity  Stability: No instability observed           Stability: No instability observed          Skin & Extremity Inspection: Skin color, temperature, and hair growth are WNL. No peripheral edema or cyanosis. No masses, redness, swelling, asymmetry, or associated skin lesions. No contractures.   Skin & Extremity Inspection: Skin color, temperature, and hair growth are WNL. No peripheral edema or cyanosis. No masses, redness, swelling, asymmetry, or associated skin lesions. No contractures.  Functional ROM: Pain restricted ROM for all joints of the lower extremity           Functional ROM: Pain restricted ROM for all joints of the lower extremity          Muscle Tone/Strength: Generalized lower extremity weakness   Muscle Tone/Strength: Generalized lower extremity weakness  Sensory  (Neurological): Neurogenic pain pattern         Sensory (Neurological): Neurogenic pain pattern        DTR: Patellar: deferred today Achilles: deferred today Plantar: deferred today   DTR: Patellar: deferred today Achilles: deferred today Plantar: deferred today  Palpation: No palpable anomalies   Palpation: No palpable anomalies    4- out of 5 strength bilateral lower extremity: Plantar flexion, dorsiflexion, knee flexion, knee extension.  Assessment & Plan  Primary Diagnosis & Pertinent Problem List: The primary encounter diagnosis was Chronic pain syndrome. Diagnoses of ALS (amyotrophic lateral sclerosis) ([place]), Pain management contract signed, Compression fracture of L2 vertebra, initial encounter ([place]), Closed compression fracture of body of L1 vertebra ([place]), and Pain in both lower legs were also pertinent to this visit.  Visit Diagnosis: 1. Chronic pain syndrome   2. ALS (amyotrophic lateral sclerosis) ([facility])   3. Pain management contract signed   4. Compression fracture of L2 vertebra, initial encounter ([place])   5. Closed compression fracture of body of L1 vertebra ([facility])   6. Pain in both lower legs    Problems updated and reviewed during this visit: Problem  Chronic Pain Syndrome  Pain Management Contract Signed    Plan of Care  Pharmacotherapy (Medications Ordered): Meds ordered this encounter  Medications   Buprenorphine HCl (BELBUCA) 300 MCG FILM    Sig: Place 1 Film inside cheek every 12 (twelve) hours.    Dispense:  60 each    Refill:  0     Provider-requested follow-up: Return in about 4 weeks (around 07/26/2022) for Medication Management, in person. Recent Visits Date  Type Provider Dept  06/14/22 Office Visit Gillis Santa, MD [facility]  Showing recent visits within past 90 days and meeting all other requirements Today's Visits Date Type Provider Dept  06/28/22 Office Visit Gillis Santa, MD [facility]  Showing today's visits and  meeting all other requirements Future Appointments Date Type Provider Dept  07/26/22 Appointment Gillis Santa, MD [facility]  Showing future appointments within next 90 days and meeting all other requirements  Primary Care Physician: [place] Note by: Gillis Santa, MD Date: 06/28/2022; Time: 1:44 PM

## 2022-06-28 NOTE — Patient Instructions (Signed)
Buprenorphine Buccal Film What is this medication? BUPRENORPHINE (byoo pre NOR feen) treats severe, chronic pain. It is prescribed when other pain medications have not worked or cannot be tolerated. It works by blocking pain signals in the brain. It belongs to a group of medications called opioids. This medication is long-acting. Do not use it to treat sudden pain. This medicine may be used for other purposes; ask your health care provider or pharmacist if you have questions. COMMON BRAND NAME(S): Belbuca What should I tell my care team before I take this medication? They need to know if you have any of these conditions: Brain tumor Dental or gum disease Drug abuse or addiction Gallbladder disease Head injury Heart disease If you often drink alcohol Irregular heartbeat or rhythm Liver disease Low adrenal gland function Lung disease, asthma, or breathing problem Mouth sores Pancreatic disease Seizures Stomach or intestine problems Taken an MAOI like Marplan, Nardil, or Parnate in the last 14 days An unusual or allergic reaction to buprenorphine, other medications, foods, dyes, or preservatives Pregnant or trying to get pregnant Breast-feeding How should I use this medication? Take this medication by mouth. Take it as directed on the prescription label. Wet the inside of your cheek with tongue or rinse mouth with water before use. Open package with dry hands just before you are ready to use. Use the tip of a dry finger to put the film in the mouth with the yellow side facing the cheek. Hold it in place for 5 seconds. Then, allow it to dissolve which usually takes 30 minutes. Do not move or touch the film with fingers or tongue. Do not eat or drink until the film has dissolved. Do not cut, chew, or swallow the film. Keep taking it unless your care team tells you to stop. A special MedGuide will be given to you by the pharmacist with each prescription and refill. Be sure to read this  information carefully each time. This medication comes with INSTRUCTIONS FOR USE. Ask your pharmacist for directions on how to use this medication. Read the information carefully. Talk to your pharmacist or care team if you have questions. Talk to your care team about the use of this medication in children. Special care may be needed. Overdosage: If you think you have taken too much of this medicine contact a poison control center or emergency room at once. NOTE: This medicine is only for you. Do not share this medicine with others. What if I miss a dose? If you miss a dose, take it as soon as you can. If it is almost time for your next dose, take only that dose. Do not take double or extra doses. What may interact with this medication? Do not take this medication with any of the following: Cisapride Dronedarone Pimozide Safinamide Samidorphan Thioridazine This medication may also interact with the following: Alcohol Antihistamines for allergy, cough and cold Certain antibiotics like clarithromycin, erythromycin Certain antivirals for hepatitis or HIV Certain medications for anxiety or sleep Certain medications for bladder problems like oxybutynin, tolterodine Certain medications for depression or psychotic disorders Certain medications for fungal infections like fluconazole, ketoconazole, posaconazole Certain medications for migraine headache like almotriptan, eletriptan, frovatriptan, naratriptan, rizatriptan, sumatriptan, zolmitriptan Certain medications for nausea or vomiting like dolasetron, ondansetron, palonosetron Certain medications for Parkinson's disease like benztropine, trihexyphenidyl Certain medications for seizures like carbamazepine, phenobarbital, phenytoin Certain medications for stomach problems like dicyclomine, hyoscyamine Certain medications for travel sickness like scopolamine Diuretics General anesthetics like halothane, isoflurane, methoxyflurane,  propofol Ipratropium Linezolid MAOIs like Carbex, Eldepryl, Marplan, Nardil, and Parnate Medications that relax muscles like cyclobenzaprine, metaxalone Methylene blue (injected into a vein) Other medications that prolong the QT interval (cause an abnormal heart rhythm) Other narcotic medications for pain or cough Phenothiazines like chlorpromazine, prochlorperazine Rifampin This list may not describe all possible interactions. Give your health care provider a list of all the medicines, herbs, non-prescription drugs, or dietary supplements you use. Also tell them if you smoke, drink alcohol, or use illegal drugs. Some items may interact with your medicine. What should I watch for while using this medication? Tell your care team if your pain does not go away, if it gets worse, or if you have new or a different type of pain. You may develop tolerance to this medication. Tolerance means that you will need a higher dose of the medication for pain relief. Tolerance is normal and is expected if you take this medication for a long time. Taking this medication with other substances that cause drowsiness, such as alcohol, benzodiazepines, or other opioids can cause serious side effects. Give your care team a list of all medications you use. They will tell you how much medication to take. Do not take more medication than directed. Call emergency services if you have problems breathing or staying awake. Long term use of this medication may cause your brain and body to depend on it. This can happen even when used as directed by your care team. You and your care team will work together to determine how long you will need to take this medication. If your care team wants you to stop this medication, the dose will be slowly lowered over time to reduce the risk of side effects. Naloxone is an emergency medication used for an opioid overdose. An overdose can happen if you take too much of an opioid. It can also happen  if an opioid is taken with some other medications or substances such as alcohol. Know the symptoms of an overdose, such as trouble breathing, unusually tired or sleepy, or not being able to respond or wake up. Make sure to tell caregivers and close contacts where your naloxone is stored. Make sure they know how to use it. After naloxone is given, the person giving it must call emergency services. Naloxone is a temporary treatment. Repeat doses may be needed. This medication may affect your coordination, reaction time, or judgment. Do not drive or operate machinery until you know how this medication affects you. Sit up or stand slowly to reduce the risk of dizzy or fainting spells. Drinking alcohol with this medication can increase the risk of these side effects. Tell your dentist that you are taking this medication. Take good care of your teeth while on this medication. Make sure you see your dentist for regular follow-up appointments. This medication will cause constipation. If you do not have a bowel movement for 3 days, call your care team. Your mouth may get dry. Chewing sugarless gum or sucking hard candy and drinking plenty of water may help. Contact your care team if the problem does not go away or is severe. Talk to your care team if you are pregnant or think you might be pregnant. This medication can cause serious birth defects if taken during pregnancy. Prolonged use of this medication during pregnancy can cause withdrawal in a newborn. Talk to your care team before breastfeeding. Changes to your treatment plan may be needed. If you breastfeed while taking this medication, seek medical  care right away if you notice the child has slow or noisy breathing, is unusually sleepy or not able to wake up, or is limp. Long-term use of this medication may cause infertility. Talk to your care team if you are concerned about your fertility. What side effects may I notice from receiving this medication? Side  effects that you should report to your care team as soon as possible: Allergic reactions--skin rash, itching, hives, swelling of the face, lips, tongue, or throat CNS depression--slow or shallow breathing, shortness of breath, feeling faint, dizziness, confusion, trouble staying awake Heart rhythm changes--fast or irregular heartbeat, dizziness, feeling faint or lightheaded, chest pain, trouble breathing Liver injury--right upper belly pain, loss of appetite, nausea, light-colored stool, dark yellow or brown urine, yellowing skin or eyes, unusual weakness or fatigue Low adrenal gland function--nausea, vomiting, loss of appetite, unusual weakness or fatigue, dizziness Low blood pressure--dizziness, feeling faint or lightheaded, blurry vision Tooth decay--change in tooth color, bad breath, sensitive gums or teeth Side effects that usually do not require medical attention (report to your care team if they continue or are bothersome): Constipation Dizziness Drowsiness Dry mouth Headache Nausea Vomiting This list may not describe all possible side effects. Call your doctor for medical advice about side effects. You may report side effects to FDA at 1-800-FDA-1088. Where should I keep my medication? Keep out of the reach of children and pets. This medication can be abused. Keep it in a safe place to protect it from theft. Do not share it with anyone. It is only for you. Selling or giving away this medication is dangerous and against the law. Store at room temperature between 20 and 25 degrees C (68 and 77 degrees F). Keep this medication in the original packaging until you are ready to take it. Get rid of any unused medication after the expiration date. This medication may cause harm and death if it is taken by other adults, children, or pets. It is important to get rid of the medication as soon as you no longer need it, or it is expired. You can do this in two ways: Take the medication to a  medication take-back program. Check with your pharmacy or law enforcement to find a location. If you cannot return the medication, remove it from the packaging and flush it down the toilet. NOTE: This sheet is a summary. It may not cover all possible information. If you have questions about this medicine, talk to your doctor, pharmacist, or health care provider.  2023 Elsevier/Gold Standard (2014-08-12 00:00:00)

## 2022-06-28 NOTE — Progress Notes (Signed)
Nursing Pain Medication Assessment:  Safety precautions to be maintained throughout the outpatient stay will include: orient to surroundings, keep bed in low position, maintain call bell within reach at all times, provide assistance with transfer out of bed and ambulation.  Medication Inspection Compliance: Pill count conducted under aseptic conditions, in front of the patient. Neither the pills nor the bottle was removed from the patient's sight at any time. Once count was completed pills were immediately returned to the patient in their original bottle.  Medication: Hydrocodone/APAP Pill/Patch Count:  2 of 10 pills remain Pill/Patch Appearance: Markings consistent with prescribed medication Bottle Appearance: Standard pharmacy container. Clearly labeled. Filled Date: 78 / 77 / 2023 Last Medication intake:  YesterdaySafety precautions to be maintained throughout the outpatient stay will include: orient to surroundings, keep bed in low position, maintain call bell within reach at all times, provide assistance with transfer out of bed and ambulation.   Ordered by Dr Kenton Kingfisher

## 2022-07-04 ENCOUNTER — Telehealth: Payer: Self-pay | Admitting: Student in an Organized Health Care Education/Training Program

## 2022-07-04 NOTE — Telephone Encounter (Signed)
Belbuca denied by insurance. Will discuss with Dr. Holley Raring.

## 2022-07-04 NOTE — Telephone Encounter (Addendum)
Patient reports the pharmacy is having problems with the insurance about filling the script for Belbuca. Please call pharmacy and advise patient. Patient states he was told he would be getting a medicine that he can put in his mouth and it would disolve but that was not what the pharmacy has. Please call patient

## 2022-07-10 ENCOUNTER — Ambulatory Visit
Admission: RE | Admit: 2022-07-10 | Discharge: 2022-07-10 | Disposition: A | Discharge status: Home / Self Care | Payer: Medicare Other | Source: Ambulatory Visit | Attending: Nurse Practitioner | Admitting: Nurse Practitioner

## 2022-07-10 ENCOUNTER — Telehealth: Payer: Self-pay | Admitting: Student in an Organized Health Care Education/Training Program

## 2022-07-10 DIAGNOSIS — R636 Underweight: Secondary | ICD-10-CM | POA: Diagnosis present

## 2022-07-10 DIAGNOSIS — R1310 Dysphagia, unspecified: Secondary | ICD-10-CM | POA: Diagnosis present

## 2022-07-10 DIAGNOSIS — G1221 Amyotrophic lateral sclerosis: Secondary | ICD-10-CM | POA: Diagnosis present

## 2022-07-10 NOTE — Telephone Encounter (Signed)
Patient delcined the offer from [facility] to help him. He does not want to have this service. Im not sure who sent this, there was no name on the fax sheet. But [facility] wants to make Dr. Holley Raring aware

## 2022-07-10 NOTE — Therapy (Signed)
[facility] [place], [place], [address] Phone: [phone]   Fax:     Modified Barium Swallow  Patient Details  Name: Shawn [name] MRN: [id number] Date of Birth: 03-15-1961 No data recorded  Encounter Date: 07/10/2022   End of Session - 07/10/22 1358     Visit Number 1    Number of Visits 1    Date for SLP Re-Evaluation 07/10/22    SLP Start Time 6    SLP Stop Time  2500    SLP Time Calculation (min) 35 min    Activity Tolerance Patient tolerated treatment well             Past Medical History:  Diagnosis Date   ALS (amyotrophic lateral sclerosis) ([place])    Asthma    Coronary artery disease    Hyperlipidemia    Renal stone    Stroke [facility])     Past Surgical History:  Procedure Laterality Date   CARDIAC CATHETERIZATION  2006   negative   CYSTOSCOPY W/ URETERAL STENT REMOVAL Left 06/11/2015   Procedure: CYSTOSCOPY WITH STENT EXCHANGE;  Surgeon: Cleon Gustin, MD;  Location: [facility] ORS;  Service: Urology;  Laterality: Left;   CYSTOSCOPY WITH STENT PLACEMENT Left 05/20/2015   Procedure: CYSTOSCOPY WITH STENT PLACEMENT;  Surgeon: Ardis Hughs, MD;  Location: [facility] ORS;  Service: Urology;  Laterality: Left;   CYSTOSCOPY/RETROGRADE/URETEROSCOPY/STONE EXTRACTION WITH BASKET  2000   pt. unsure of which side done   CYSTOSCOPY/URETEROSCOPY/HOLMIUM LASER Left 06/11/2015   Procedure: CYSTOSCOPY/URETEROSCOPY/HOLMIUM LASER;  Surgeon: Cleon Gustin, MD;  Location: [facility] ORS;  Service: Urology;  Laterality: Left;   IR KYPHO LUMBAR INC FX REDUCE BONE BX UNI/BIL CANNULATION INC/IMAGING  01/06/2022   KNEE SURGERY Right 84,96, 2003   x3   SHOULDER SURGERY Left 2010    There were no vitals filed for this visit.    07/10/22 1400  SLP Visit Information  SLP Received On 07/10/22  Subjective  Subjective Pt reports coughing with intake, worse on some days vs others. Also complains of consistent  globus sensation  Patient/Family Stated Goal continue to eat for pleasure  Pain Assessment  Pain Assessment No/denies pain  General Information  Date of Onset 07/10/22  HPI Patient is a 61 y.o. male referred for MBS to evaluate oropharyngeal dysphagia secondary to ALS, diagnosed in 2022. Past medical history also includes CVA (however, MRI brain 05/07/20 showed "no evidence of acute or chronic infarct", asthma, frontotemporal dementia,  Pt had MBS in 2021, prior to ALS diagnosis with findings of mild oropharyngeal dysphagia, with residue (L>r) after the swallow. He has been working with [facility] SLP; has G-tube and currently takes some medications as well as 5 bottles via tube per day, consuming soft solids, thin liquids for pleasure by mouth, as well as some medications which he's unable to crush. Pt denies recent history of pneumonia; he has not needed the Heimlich maneuver. He reports intermittent signs of dysphagia including coughing with intake.  Type of Study MBS-Modified Barium Swallow Study  Previous Swallow Assessment see HPI  Diet Prior to this Study Dysphagia 3 (soft);Thin liquids  Temperature Spikes Noted No  Respiratory Status Room air  History of Recent Intubation No  Behavior/Cognition Alert;Cooperative  Oral Cavity Assessment WFL  Oral Care Completed by SLP No  Oral Cavity - Dentition Other (Comment) (reports some pain due to dental abscess that was worked on last week)  Insurance claims handler for self feeding  Self-Feeding Abilities Able to feed self  Patient Positioning Upright in chair  Baseline Vocal Quality Other (comment) (low pitch, strained quality)  Volitional Cough Other (Comment) (strength reduced)  Volitional Swallow Able to elicit  Anatomy [facility]  Pharyngeal Secretions Not observed secondary MBS  Oral Motor/Sensory Function  Overall Oral Motor/Sensory Function Moderate impairment  Facial ROM Reduced right;Reduced left  Facial Symmetry WFL  Lingual ROM Reduced  right;Reduced left  Lingual Symmetry WFL  Lingual Strength Reduced  Oral Preparation/Oral Phase  Oral Phase Impaired  Oral - Pudding  Oral - Pudding Teaspoon Weak lingual manipulation;Other (Comment) (gags and expectorates)  Oral - Honey  Oral - Honey Teaspoon Weak lingual manipulation;Other (Comment) (gags and exectorates)  Oral - Nectar  Oral - Nectar Teaspoon Weak lingual manipulation;Lingual pumping;Reduced posterior propulsion;Lingual/palatal residue;Piecemeal swallowing;Decreased bolus cohesion  Oral - Nectar Cup Weak lingual manipulation;Lingual pumping;Reduced posterior propulsion;Lingual/palatal residue;Piecemeal swallowing;Decreased bolus cohesion  Oral - Thin  Oral - Thin Teaspoon Weak lingual manipulation;Lingual pumping;Reduced posterior propulsion;Lingual/palatal residue;Piecemeal swallowing;Decreased bolus cohesion  Oral - Thin Cup Weak lingual manipulation;Lingual pumping;Reduced posterior propulsion;Lingual/palatal residue;Piecemeal swallowing;Decreased bolus cohesion  Oral - Thin Straw Weak lingual manipulation;Lingual pumping;Reduced posterior propulsion;Lingual/palatal residue;Piecemeal swallowing;Decreased bolus cohesion  Pharyngeal Phase  Pharyngeal Phase Impaired  Pharyngeal - Pudding  Pharyngeal- Pudding Teaspoon Other (Comment) (n/a- expectorated)  Pharyngeal Material does not enter airway  Pharyngeal - Honey  Pharyngeal- Honey Teaspoon Other (Comment) (n/a- expectorated)  Pharyngeal Material does not enter airway  Pharyngeal - Nectar  Pharyngeal- Nectar Teaspoon Reduced tongue base retraction;Reduced anterior laryngeal mobility;Pharyngeal residue - pyriform  Pharyngeal Material does not enter airway  Pharyngeal- Nectar Cup Reduced tongue base retraction;Reduced anterior laryngeal mobility;Pharyngeal residue - pyriform;Reduced laryngeal elevation  Pharyngeal Material does not enter airway  Pharyngeal - Thin  Pharyngeal- Thin Teaspoon Reduced tongue base  retraction;Reduced anterior laryngeal mobility;Pharyngeal residue - pyriform  Pharyngeal Material does not enter airway  Pharyngeal- Thin Cup Reduced tongue base retraction;Reduced anterior laryngeal mobility;Pharyngeal residue - pyriform  Pharyngeal Material does not enter airway  Pharyngeal- Thin Straw Reduced tongue base retraction;Reduced anterior laryngeal mobility;Pharyngeal residue - pyriform  Pharyngeal Material does not enter airway  Pharyngeal - Solids  Pharyngeal- Regular Reduced tongue base retraction;Reduced anterior laryngeal mobility;Pharyngeal residue - pyriform  Pharyngeal Material does not enter airway  Cervical Esophageal Phase  Cervical Esophageal Phase Impaired  Cervical Esophageal Phase - Solids  Regular Reduced cricopharyngeal relaxation  Clinical Impression  Clinical Impression Patient presents with mild-moderate oropharyngeal dysphagia. Oral stage is characterized by weak, disorganized lingual transport, with bolus collection in the left lateral sulcus and lingual pumping, piecemealing noted. Able to clear oral residue with additional swallow. Swallow initiation occurs at the level of the base of tongue. Pharyngeal stage of the swallow is characterized by mildly reduced base of tongue retraction and mildly reduced duration of hyolaryngeal excursion, leading to mild-moderate residue in the pyriform sinuses (greater residue with thicker consistencies. This reduces but does not fully clear with subsequent swallow. Testing was limited by texture sensitivity; honey-thick and pudding-thick textures elicited gag response while still on anterior tongue; pt expectorated. There is no laryngeal penetration or aspiration observed on this examination. Patient and mother were educated on findings and recommendations using video for teachback and written handout was provided. Educated on impact of fatigue with exertion on swallow function, as well as signs/symptoms to monitor (overt signs of  aspiration, signs of aspiration pneumonia). Recommend pt continue to collaborate with care team (ALS clinic, [facility] SLP) to monitor for changes in swallow function and  make decisions re: comfort feeds. Based on today's presentation it appears appropriate for pt to continue pleasure feeds of soft solids and thin liquids.  SLP Visit Diagnosis Dysphagia, oropharyngeal phase (R13.12)  Impact on safety and function Mild aspiration risk;Moderate aspiration risk;Risk for inadequate nutrition/hydration (fatigue increases risk)  Swallow Evaluation Recommendations  SLP Diet Recommendations Other (Comment);Dysphagia 3 (Mech soft) solids;Thin liquid (via PEG, supplemented by pleasure feeds of soft solids, thin liquids)  Liquid Administration via Cup;Straw  Medication Administration Via alternative means (or one at a time if by mouth necessary)  Supervision Patient able to self feed  Compensations Slow rate;Small sips/bites  Postural Changes Remain semi-upright after after feeds/meals (Comment);Seated upright at 90 degrees  Treatment Plan  Oral Care Recommendations Oral care BID  Treatment Recommendations Defer treatment plan to f/u with SLP  Follow Up Recommendations Home health SLP  Individuals Consulted  Consulted and Agree with Results and Recommendations Patient;Family member/caregiver  Family Member Consulted mother  Report Sent to  Referring physician  Progression Toward Goals  Progression toward goals Goals met, education completed, patient discharged from SLP  SLP Evaluations  $ SLP Speech Visit 1 Visit  SLP Evaluations  $Outpatient MBS Swallow 1 Procedure   Dysphagia, unspecified type - Plan: DG SWALLOW FUNC OP MEDICARE SPEECH PATH, DG SWALLOW FUNC OP MEDICARE SPEECH PATH  Amyotrophic lateral sclerosis (ALS) ([place]) - Plan: DG SWALLOW FUNC OP MEDICARE SPEECH PATH, DG SWALLOW FUNC OP MEDICARE SPEECH PATH  Underweight - Plan: DG SWALLOW FUNC OP MEDICARE SPEECH PATH, DG SWALLOW FUNC OP MEDICARE  SPEECH PATH        Problem List Patient Active Problem List   Diagnosis Date Noted   Chronic pain syndrome 06/28/2022   Pain management contract signed 06/28/2022   Closed compression fracture of body of L1 vertebra ([place]) 04/03/2022   Dyslipidemia 04/03/2022   Right leg pain 11/13/2021   COVID-19 virus infection 11/13/2021   Acute hypoxemic respiratory failure due to COVID-19 ([place]) 11/13/2021   Acute respiratory failure with hypoxia ([place]) 11/12/2021   Compression fracture of L2 ([place]) 11/07/2021   Lumbago 11/07/2021   Pain in both lower legs 11/07/2021   ALS (amyotrophic lateral sclerosis) ([place]) 11/07/2021   Asthma, chronic 11/07/2021   Ureteral stone 05/20/2015   Deneise Lever, MS, CCC-SLP Speech-Language Pathologist [phone]   Aliene Altes, [place] 07/10/2022, 6:20 PM  [place], [place], [address] Phone: [phone]   Fax:     Name: Shawn [name] MRN: [id number] Date of Birth: 1961/05/02

## 2022-07-13 ENCOUNTER — Telehealth: Payer: Self-pay | Admitting: Student in an Organized Health Care Education/Training Program

## 2022-07-13 ENCOUNTER — Telehealth: Payer: Self-pay | Admitting: *Deleted

## 2022-07-13 NOTE — Telephone Encounter (Signed)
PA request sent to [facility]

## 2022-07-13 NOTE — Telephone Encounter (Signed)
After much time and effort Medicare D, [employer] will not cover Belbuca, I have spoken with patient, who was unable to communicate with me.    I did call Jamelle Haring, Home health nurse to let her know that medication is not covered and that I have sent message to [facility] re; this and what medications are covered via his plan.

## 2022-07-13 NOTE — Telephone Encounter (Signed)
Jamelle Haring at [facility] is calling to check up on status of Belbuca? He is in a lot of pain. Please call asap  Jamelle Haring [phone]

## 2022-07-14 ENCOUNTER — Other Ambulatory Visit: Payer: Self-pay | Admitting: Student in an Organized Health Care Education/Training Program

## 2022-07-14 DIAGNOSIS — Z0289 Encounter for other administrative examinations: Secondary | ICD-10-CM

## 2022-07-14 DIAGNOSIS — G894 Chronic pain syndrome: Secondary | ICD-10-CM

## 2022-07-14 DIAGNOSIS — G1221 Amyotrophic lateral sclerosis: Secondary | ICD-10-CM

## 2022-07-14 MED ORDER — TRAMADOL HCL 50 MG PO TABS: 50.0000 mg | ORAL_TABLET | Freq: Two times a day (BID) | ORAL | 0 refills | Status: DC | PRN

## 2022-07-19 ENCOUNTER — Telehealth: Payer: Self-pay | Admitting: Student in an Organized Health Care Education/Training Program

## 2022-07-19 NOTE — Telephone Encounter (Signed)
Script was sent in  for Tramadol last week (10/13)because his Belbuca was denied. Caregiver states he is allergic to tramadol. Do you have anything else to offer him?

## 2022-07-19 NOTE — Telephone Encounter (Signed)
[place] [phone] Last week she called trying to get him a pain medication. Please call her about this.

## 2022-07-20 NOTE — Telephone Encounter (Signed)
Called [place]. Informed her of Dr. Elwyn Lade advise.

## 2022-07-26 ENCOUNTER — Encounter: Payer: Self-pay | Admitting: Student in an Organized Health Care Education/Training Program

## 2022-07-26 ENCOUNTER — Telehealth: Payer: Self-pay | Admitting: Student in an Organized Health Care Education/Training Program

## 2022-07-26 ENCOUNTER — Ambulatory Visit
Payer: Medicare Other | Attending: Student in an Organized Health Care Education/Training Program | Admitting: Student in an Organized Health Care Education/Training Program

## 2022-07-26 VITALS — BP 112/81 | HR 96 | Temp 98.4°F | Resp 16 | Ht 70.0 in | Wt 122.0 lb

## 2022-07-26 DIAGNOSIS — M79662 Pain in left lower leg: Secondary | ICD-10-CM | POA: Diagnosis present

## 2022-07-26 DIAGNOSIS — G1221 Amyotrophic lateral sclerosis: Secondary | ICD-10-CM | POA: Insufficient documentation

## 2022-07-26 DIAGNOSIS — G894 Chronic pain syndrome: Secondary | ICD-10-CM | POA: Insufficient documentation

## 2022-07-26 DIAGNOSIS — M79661 Pain in right lower leg: Secondary | ICD-10-CM | POA: Insufficient documentation

## 2022-07-26 DIAGNOSIS — S32010A Wedge compression fracture of first lumbar vertebra, initial encounter for closed fracture: Secondary | ICD-10-CM | POA: Insufficient documentation

## 2022-07-26 DIAGNOSIS — S32020A Wedge compression fracture of second lumbar vertebra, initial encounter for closed fracture: Secondary | ICD-10-CM | POA: Diagnosis present

## 2022-07-26 MED ORDER — HYDROCODONE-ACETAMINOPHEN 7.5-325 MG/15ML PO SOLN: 15.0000 mL | Freq: Two times a day (BID) | ORAL | 0 refills | Status: DC | PRN

## 2022-07-26 NOTE — Progress Notes (Signed)
Safety precautions to be maintained throughout the outpatient stay will include: orient to surroundings, keep bed in low position, maintain call bell within reach at all times, provide assistance with transfer out of bed and ambulation.   Did not bring medication. Patient never got them

## 2022-07-26 NOTE — Telephone Encounter (Signed)
Patient's mother called stating [facility] does not have his meds for the scripts sent in today. She is going to call [facility] and see if they can fill, then call back.

## 2022-07-26 NOTE — Telephone Encounter (Signed)
Marzetta Board called saying their protocol has changed.  A patient must have an active diagnosis of cancer or will be needing Hospice within 2 months before they can take this referral. She needs someone to call her with this information before she can process the referral for this patient.  Marzetta Board [phone] opt 2

## 2022-07-26 NOTE — Patient Instructions (Signed)
_______________________________________________________________________  Medication Rules  Purpose: To inform patients, and their family members, of our medication rules and regulations.  Applies to: All patients receiving prescriptions from our practice (written or electronic).  Pharmacy of record: This is the pharmacy where your electronic prescriptions will be sent. Make sure we have the correct one.  Electronic prescriptions: In compliance with the [place] (STOP) Act of 2017 (Session Lanny Cramp [phone]), effective October 02, 2018, all controlled substances must be electronically prescribed. Written prescriptions, faxing, or calling prescriptions to a pharmacy will no longer be done.  Prescription refills: These will be provided only during in-person appointments. No medications will be renewed without a "face-to-face" evaluation with your provider. Applies to all prescriptions.  NOTE: The following applies primarily to controlled substances (Opioid* Pain Medications).   Type of encounter (visit): For patients receiving controlled substances, face-to-face visits are required. (Not an option and not up to the patient.)  Patient's responsibilities: Pain Pills: Bring all pain pills to every appointment (except for procedure appointments). Pill Bottles: Bring pills in original pharmacy bottle. Bring bottle, even if empty. Always bring the bottle of the most recent fill.  Medication refills: You are responsible for knowing and keeping track of what medications you are taking and when is it that you will need a refill. The day before your appointment: write a list of all prescriptions that need to be refilled. The day of the appointment: give the list to the admitting nurse. Prescriptions will be written only during appointments. No prescriptions will be written on procedure days. If you forget a medication: it will not be "Called in", "Faxed", or  "electronically sent". You will need to get another appointment to get these prescribed. No early refills. Do not call asking to have your prescription filled early. Partial  or short prescriptions: Occasionally your pharmacy may not have enough pills to fill your prescription.  NEVER ACCEPT a partial fill or a prescription that is short of the total amount of pills that you were prescribed.  With controlled substances the law allows 72 hours for the pharmacy to complete the prescription.  If the prescription is not completed within 72 hours, the pharmacist will require a new prescription to be written. This means that you will be short on your medicine and we WILL NOT send another prescription to complete your original prescription.  Instead, request the pharmacy to send a carrier to a nearby branch to get enough medication to provide you with your full prescription. Prescription Accuracy: You are responsible for carefully inspecting your prescriptions before leaving our office. Have the discharge nurse carefully go over each prescription with you, before taking them home. Make sure that your name is accurately spelled, that your address is correct. Check the name and dose of your medication to make sure it is accurate. Check the number of pills, and the written instructions to make sure they are clear and accurate. Make sure that you are given enough medication to last until your next medication refill appointment. Taking Medication: Take medication as prescribed. When it comes to controlled substances, taking less pills or less frequently than prescribed is permitted and encouraged. Never take more pills than instructed. Never take the medication more frequently than prescribed.  Inform other Doctors: Always inform, all of your healthcare providers, of all the medications you take. Pain Medication from other Providers: You are not allowed to accept any additional pain medication from any other Doctor or  Healthcare provider. There are  two exceptions to this rule. (see below) In the event that you require additional pain medication, you are responsible for notifying us, as stated below. Cough Medicine: Often these contain an opioid, such as codeine or hydrocodone. Never accept or take cough medicine containing these opioids if you are already taking an opioid* medication. The combination may cause respiratory failure and death. Medication Agreement: You are responsible for carefully reading and following our Medication Agreement. This must be signed before receiving any prescriptions from our practice. Safely store a copy of your signed Agreement. Violations to the Agreement will result in no further prescriptions. (Additional copies of our Medication Agreement are available upon request.) Laws, Rules, & Regulations: All patients are expected to follow all Federal and [employer], [employer], Rules, [employer]. Ignorance of the Laws does not constitute a valid excuse.  Illegal drugs and Controlled Substances: The use of illegal substances (including, but not limited to marijuana and its derivatives) and/or the illegal use of any controlled substances is strictly prohibited. Violation of this rule may result in the immediate and permanent discontinuation of any and all prescriptions being written by our practice. The use of any illegal substances is prohibited. Adopted CDC guidelines & recommendations: Target dosing levels will be at or below 60 MME/day. Use of benzodiazepines** is not recommended.  Exceptions: There are only two exceptions to the rule of not receiving pain medications from other Healthcare Providers. Exception #1 (Emergencies): In the event of an emergency (i.e.: accident requiring emergency care), you are allowed to receive additional pain medication. However, you are responsible for: As soon as you are able, call our office [phone], at any time of the day or night, and leave a  message stating your name, the date and nature of the emergency, and the name and dose of the medication prescribed. In the event that your call is answered by a member of our staff, make sure to document and save the date, time, and the name of the person that took your information.  Exception #2 (Planned Surgery): In the event that you are scheduled by another doctor or dentist to have any type of surgery or procedure, you are allowed (for a period no longer than 30 days), to receive additional pain medication, for the acute post-op pain. However, in this case, you are responsible for picking up a copy of our "Post-op Pain Management for Surgeons" handout, and giving it to your surgeon or dentist. This document is available at our office, and does not require an appointment to obtain it. Simply go to our office during business hours (Monday-Thursday from 8:00 AM to 4:00 PM) (Friday 8:00 AM to 12:00 Noon) or if you have a scheduled appointment with [place], prior to your surgery, and ask for it by name. In addition, you are responsible for: calling our office [phone], at any time of the day or night, and leaving a message stating your name, name of your surgeon, type of surgery, and date of procedure or surgery. Failure to comply with your responsibilities may result in termination of therapy involving the controlled substances. Medication Agreement Violation. Following the above rules, including your responsibilities will help you in avoiding a Medication Agreement Violation ("Breaking your Pain Medication Contract").  Consequences:  Not following the above rules may result in permanent discontinuation of medication prescription therapy.  *Opioid medications include: morphine, codeine, oxycodone, oxymorphone, hydrocodone, hydromorphone, meperidine, tramadol, tapentadol, buprenorphine, fentanyl, methadone. **Benzodiazepine medications include: diazepam (Valium), alprazolam (Xanax), clonazepam (Klonopine),  lorazepam (  Ativan), clorazepate (Tranxene), chlordiazepoxide (Librium), estazolam (Prosom), oxazepam (Serax), temazepam (Restoril), triazolam (Halcion) (Last updated: 07/25/2022) ______________________________________________________________________  Pain Management Discharge Instructions  General Discharge Instructions :  If you need to reach your doctor call: Monday-Friday 8:00 am - 4:00 pm at [phone] or toll free [phone].  After clinic hours [phone] to have operator reach doctor.  Bring all of your medication bottles to all your appointments in the pain clinic.  To cancel or reschedule your appointment with Pain Management please remember to call 24 hours in advance to avoid a fee.  Refer to the educational materials which you have been given on: General Risks, I had my Procedure. Discharge Instructions, Post Sedation.  Post Procedure Instructions:  The drugs you were given will stay in your system until tomorrow, so for the next 24 hours you should not drive, make any legal decisions or drink any alcoholic beverages.  You may eat anything you prefer, but it is better to start with liquids then soups and crackers, and gradually work up to solid foods.  Please notify your doctor immediately if you have any unusual bleeding, trouble breathing or pain that is not related to your normal pain.  Depending on the type of procedure that was done, some parts of your body may feel week and/or numb.  This usually clears up by tonight or the next day.  Walk with the use of an assistive device or accompanied by an adult for the 24 hours.  You may use ice on the affected area for the first 24 hours.  Put ice in a Ziploc bag and cover with a towel and place against area 15 minutes on 15 minutes off.  You may switch to heat after 24 hours.

## 2022-07-26 NOTE — Progress Notes (Signed)
PROVIDER NOTE: Information contained herein reflects review and annotations entered in association with encounter. Interpretation of such information and data should be left to medically-trained personnel. Information provided to patient can be located elsewhere in the medical record under "Patient Instructions". Document created using STT-dictation technology, any transcriptional errors that may result from process are unintentional.    Patient: Shawn [name]  Service Category: E/M  Provider: Gillis Santa, MD  DOB: [birth year]  DOS: 07/26/2022  Referring Provider: [facility] Service*  MRN: [id number]  Specialty: Interventional Pain Management  PCP: [place]  Type: Established Patient  Setting: Ambulatory outpatient    Location: Office  Delivery: Face-to-face     HPI  [name], a 61 y.o. year old male, is here today because of his Chronic pain syndrome [G89.4]. [name] primary complain today is Back Pain Last encounter: My last encounter with him was on 07/19/2022. Pertinent problems: [name] has Compression fracture of L2 ([place]); Pain in both lower legs; ALS (amyotrophic lateral sclerosis) ([place]); Closed compression fracture of body of L1 vertebra ([place]); Chronic pain syndrome; and Pain management contract signed on their pertinent problem list. Pain Assessment: Severity of Chronic pain is reported as a 10-Worst pain ever/10. Location: Hip Lower, Right/hip. Onset: More than a month ago. Quality: Aching, Burning, Constant, Throbbing, Stabbing. Timing: Constant. Modifying factor(s): "Nothing helps". Vitals:  height is 5' 10"  (1.778 m) and weight is 122 lb (55.3 kg). His temperature is 98.4 F (36.9 C). His blood pressure is 112/81 and his pulse is 96. His respiration is 16 and oxygen saturation is 99%.   Reason for encounter: medication management.  Tiwan presents today for medication management.  He is somewhat frustrated with his constant and severe  pain.  He has an allergy to tramadol.  Unfortunately buprenorphine was not approved.  Although we were hoping to avoid hydrocodone, at this point, Shawn [name] has very limited options from an oral pharmacologic standpoint.  We discussed hydrocodone 7.5 mg twice daily as needed which will be a elixir given that he has trouble swallowing secondary to his ALS.  I also think that Shawn [name] will be better served under the care of palliative care.  I will place a referral for this.  Until then, I will continue to manage his hydrocodone.  I will have him sign pain contract today.  I reviewed with him in detail how to take the hydrocodone and that 15 mL is equal to 7.5 mg and that he should not exceed this dose.   06/28/22 No significant change in his medical history other than he went to the urgent care on 06/19/2022 for increased low back pain as a result of his compression fractures.  Today was prescribed a small quantity of hydrocodone. Shawn [name] was honest with me before and told me that his urine toxicology screen will be positive for THC.  He states that he has since stopped utilizing THC.  We will obtain a another urine toxicology screen at his next visit.  This should be negative for THC.   We discussed hydrocodone versus buprenorphine for chronic pain management.  I recommend a trial of buprenorphine, belbuca as this is safer, has a longer duration of analgesic effect and has less overall side effects.  We will hope to get preauthorization for this.     HPI from initial clinic visit 06/14/22: Shawn [name] is a pleasant 61 year old male who presents with a chief complaint of low back pain.  He has a history of  L1 and L2 compression fracture.  His L1 compression fracture occurred on 04/05/2022 after a fall.  He has a history of L2 compression fracture status post kyphoplasty.  He does have a history of ALS unfortunately.  He has progressive shortness of breath, difficulty walking and severe weakness.  He recently had a G-tube  placed last month.  He is here primarily for medication management/palliative care of chronic low back pain secondary to L1 compression fracture.  He states that he has found benefit with hydrocodone compared to oxycodone.  He was very honest with me and states that his urine toxicology screen will be positive for THC as he did smoke last month.  He states that he is no longer utilizing THC.    Pharmacotherapy Assessment  Analgesic: Hydrocodone 7.5 mg twice daily as needed, elixir.  Monitoring: [place] PMP: PDMP reviewed during this encounter.       Pharmacotherapy: No side-effects or adverse reactions reported. Compliance: No problems identified. Effectiveness: Clinically acceptable.  Shawn Specking, RN  07/26/2022  1:15 PM  Sign when Signing Visit Safety precautions to be maintained throughout the outpatient stay will include: orient to surroundings, keep bed in low position, maintain call bell within reach at all times, provide assistance with transfer out of bed and ambulation.   Did not bring medication. Patient never got them    No results found for: "CBDTHCR" No results found for: "D8THCCBX" No results found for: "D9THCCBX"  UDS:  Summary  Date Value Ref Range Status  06/14/2022 Note  Final    Comment:    ==================================================================== Compliance Drug Analysis, Ur ==================================================================== Test                             Result       Flag       Units  Drug Present and Declared for Prescription Verification   Gabapentin                     PRESENT      EXPECTED   Baclofen                       PRESENT      EXPECTED  Drug Present not Declared for Prescription Verification   Carboxy-THC                    262          UNEXPECTED ng/mg creat    Carboxy-THC is a metabolite of tetrahydrocannabinol (THC). Source of    THC is most commonly herbal marijuana or marijuana-based products,    but THC is  also present in a scheduled prescription medication.    Trace amounts of THC can be present in hemp and cannabidiol (CBD)    products. This test is not intended to distinguish between delta-9-    tetrahydrocannabinol, the predominant form of THC in most herbal or    marijuana-based products, and delta-8-tetrahydrocannabinol.  Drug Absent but Declared for Prescription Verification   Trazodone                      Not Detected UNEXPECTED   Acetaminophen                  Not Detected UNEXPECTED    Acetaminophen, as indicated in the declared medication list, is not    always detected even when used as  directed.  ==================================================================== Test                      Result    Flag   Units      Ref Range   Creatinine              34               mg/dL      >=20 ==================================================================== Declared Medications:  The flagging and interpretation on this report are based on the  following declared medications.  Unexpected results may arise from  inaccuracies in the declared medications.   **Note: The testing scope of this panel includes these medications:   Baclofen  Gabapentin (Neurontin)  Trazodone (Desyrel)   **Note: The testing scope of this panel does not include small to  moderate amounts of these reported medications:   Acetaminophen (Tylenol)   **Note: The testing scope of this panel does not include the  following reported medications:   Albuterol (Ventolin HFA)  Atorvastatin (Lipitor)  Fluticasone (Breo)  Montelukast (Singulair)  Riluzole  Vilanterol (Breo) ==================================================================== For clinical consultation, please call [phone]. ====================================================================       ROS  Constitutional:  Weakness Gastrointestinal: No reported hemesis, hematochezia, vomiting, or acute GI distress Musculoskeletal:   Diffuse arthralgias and myalgias Neurological:  Lower extremity paresthesias  Medication Review  Baclofen, HYDROcodone-acetaminophen, Phenylbutyrate-Taurursodiol, acetaminophen, albuterol, atorvastatin, fluticasone furoate-vilanterol, gabapentin, montelukast, riluzole, and traZODone  History Review  Allergy: Shawn [name] is allergic to aspirin, compazine [prochlorperazine], morphine and related, naproxen, penicillins, and tramadol. Drug: Shawn [name]  reports no history of drug use. Alcohol:  reports that he does not currently use alcohol. Tobacco:  reports that he has never smoked. He has never used smokeless tobacco. Social: Shawn [name]  reports that he has never smoked. He has never used smokeless tobacco. He reports that he does not currently use alcohol. He reports that he does not use drugs. Medical:  has a past medical history of ALS (amyotrophic lateral sclerosis) ([place]), Asthma, Coronary artery disease, Hyperlipidemia, Renal stone, and Stroke ([place]). Surgical: Shawn [name]  has a past surgical history that includes Cystoscopy with stent placement (Left, 05/20/2015); Knee surgery (Right, 84,96, 2003); Shoulder surgery (Left, 2010); Cardiac catheterization (2006); Cystoscopy/retrograde/ureteroscopy/stone extraction with basket (2000); Cystoscopy/ureteroscopy/holmium laser (Left, 06/11/2015); Cystoscopy w/ ureteral stent removal (Left, 06/11/2015); and IR KYPHO LUMBAR INC FX REDUCE BONE BX UNI/BIL CANNULATION INC/IMAGING (01/06/2022). Family: family history includes CVA in his mother; Cancer in his mother; Diabetes in an other family member; Heart failure in his mother; Hypertension in his mother; Other in his father.  Laboratory Chemistry Profile   Renal Lab Results  Component Value Date   BUN 13 04/04/2022   CREATININE 0.50 (L) 04/04/2022   GFRAA >60 05/20/2015   GFRNONAA >60 04/04/2022    Hepatic Lab Results  Component Value Date   AST 28 04/03/2022   ALT 33 04/03/2022   ALBUMIN 4.4  04/03/2022   ALKPHOS 101 04/03/2022   LIPASE 27 05/20/2015    Electrolytes Lab Results  Component Value Date   NA 138 04/04/2022   K 3.8 04/04/2022   CL 112 (H) 04/04/2022   CALCIUM 8.5 (L) 04/04/2022    Bone No results found for: "VD25OH", "VD125OH2TOT", "LZ7673AL9", "FX9024OX7", "25OHVITD1", "25OHVITD2", "25OHVITD3", "TESTOFREE", "TESTOSTERONE"  Inflammation (CRP: Acute Phase) (ESR: Chronic Phase) No results found for: "CRP", "ESRSEDRATE", "LATICACIDVEN"       Note: Above Lab results reviewed.  Recent Imaging Review  DG  SWALLOW FUNC OP MEDICARE SPEECH PATH Table formatting from the original result was not included. CLINICAL DATA:  Patient with history of ALS, CVA with dysphagia. Presents for modified swallowing study with Speech Therapy  EXAM: MODIFIED BARIUM SWALLOW  TECHNIQUE: Different consistencies of barium were administered orally to the patient by the Speech Pathologist. Imaging of the pharynx was performed in the lateral projection. By Rushie Nyhan NP was present in the fluoroscopy room for this study. The radiologist provided personal supervision  FLUOROSCOPY: Radiation Exposure Index (as provided by the fluoroscopic device): 16.9 mGy [place]  COMPARISON:  None Available.  FINDINGS: Vestibular  Penetration: None seen.  Aspiration:  None seen.  Other:  Patient unable to tolerate honey thickened or pudding  IMPRESSION: No vestibular penetration or aspiration seen.  Please refer to the Speech Pathologists report for complete details and recommendations.  This exam was performed by Rushie Nyhan N/P. And was supervised by Dr. Yetta Glassman  Electronically Signed   By: Yetta Glassman M.D.   On: 07/10/2022 14:17   07/10/22 1400  SLP Visit Information  SLP Received On 07/10/22  Subjective  Subjective Pt reports coughing with intake, worse on some days vs others.  Also complains of consistent globus sensation  Patient/Family Stated  Goal continue to eat for pleasure  Pain Assessment  Pain Assessment No/denies pain  General Information  Date of Onset 07/10/22  HPI Patient is a 61 y.o. 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 Past medical history also  includes CVA (however, MRI brain 05/07/20 showed "no evidence of acute or  chronic infarct", asthma, frontotemporal dementia,  Pt had MBS in 2021,  prior to ALS diagnosis with findings of mild oropharyngeal dysphagia, with  residue (L>r) after the swallow. He has been working with [facility] SLP; has  G-tube and currently takes some medications as well as 5 bottles via tube  per day, consuming soft solids, thin liquids for pleasure by mouth, as  well as some medications which he's unable to crush. Pt denies recent  history of pneumonia; he has not needed the Heimlich maneuver. He reports  intermittent signs of dysphagia including coughing with intake.  Type of Study MBS-Modified Barium Swallow Study  Previous Swallow Assessment see HPI  Diet Prior to this Study Dysphagia 3 (soft);Thin liquids  Temperature Spikes Noted No  Respiratory Status Room air  History of Recent Intubation No  Behavior/Cognition Alert;Cooperative  Oral Cavity Assessment WFL  Oral Care Completed by SLP No  Oral Cavity - Dentition Other (Comment) (reports some pain due to dental abscess that was worked on last week)  Insurance claims handler for self feeding  Self-Feeding Abilities Able to feed self  Patient Positioning Upright in chair  Baseline Vocal Quality Other (comment) (low pitch, strained quality)  Volitional Cough Other (Comment) (strength reduced)  Volitional Swallow Able to elicit  Anatomy WFL  Pharyngeal Secretions Not observed secondary MBS  Oral Motor/Sensory Function  Overall Oral Motor/Sensory Function Moderate impairment  Facial ROM Reduced right;Reduced left  Facial Symmetry WFL  Lingual ROM Reduced right;Reduced left  Lingual  Symmetry WFL  Lingual Strength Reduced  Oral Preparation/Oral Phase  Oral Phase Impaired  Oral - Pudding  Oral - Pudding Teaspoon Weak lingual manipulation;Other (Comment) (gags and expectorates)  Oral - Honey  Oral - Honey Teaspoon Weak lingual manipulation;Other (Comment) (gags and exectorates)  Oral - Nectar  Oral - Nectar Teaspoon Weak lingual manipulation;Lingual pumping;Reduced  posterior propulsion;Lingual/palatal residue;Piecemeal  swallowing;Decreased bolus  cohesion  Oral - Nectar Cup Weak lingual manipulation;Lingual pumping;Reduced  posterior propulsion;Lingual/palatal residue;Piecemeal  swallowing;Decreased bolus cohesion  Oral - Thin  Oral - Thin Teaspoon Weak lingual manipulation;Lingual pumping;Reduced  posterior propulsion;Lingual/palatal residue;Piecemeal  swallowing;Decreased bolus cohesion  Oral - Thin Cup Weak lingual manipulation;Lingual pumping;Reduced  posterior propulsion;Lingual/palatal residue;Piecemeal  swallowing;Decreased bolus cohesion  Oral - Thin Straw Weak lingual manipulation;Lingual pumping;Reduced  posterior propulsion;Lingual/palatal residue;Piecemeal  swallowing;Decreased bolus cohesion  Pharyngeal Phase  Pharyngeal Phase Impaired  Pharyngeal - Pudding  Pharyngeal- Pudding Teaspoon Other (Comment) (n/a- expectorated)  Pharyngeal Material does not enter airway  Pharyngeal - Honey  Pharyngeal- Honey Teaspoon Other (Comment) (n/a- expectorated)  Pharyngeal Material does not enter airway  Pharyngeal - Nectar  Pharyngeal- Nectar Teaspoon Reduced tongue base retraction;Reduced  anterior laryngeal mobility;Pharyngeal residue - pyriform  Pharyngeal Material does not enter airway  Pharyngeal- Nectar Cup Reduced tongue base retraction;Reduced anterior  laryngeal mobility;Pharyngeal residue - pyriform;Reduced laryngeal  elevation  Pharyngeal Material does not enter airway  Pharyngeal - Thin  Pharyngeal- Thin Teaspoon Reduced tongue base  retraction;Reduced anterior  laryngeal mobility;Pharyngeal residue - pyriform  Pharyngeal Material does not enter airway  Pharyngeal- Thin Cup Reduced tongue base retraction;Reduced anterior  laryngeal mobility;Pharyngeal residue - pyriform  Pharyngeal Material does not enter airway  Pharyngeal- Thin Straw Reduced tongue base retraction;Reduced anterior  laryngeal mobility;Pharyngeal residue - pyriform  Pharyngeal Material does not enter airway  Pharyngeal - Solids  Pharyngeal- Regular Reduced tongue base retraction;Reduced anterior  laryngeal mobility;Pharyngeal residue - pyriform  Pharyngeal Material does not enter airway  Cervical Esophageal Phase  Cervical Esophageal Phase Impaired  Cervical Esophageal Phase - Solids  Regular Reduced cricopharyngeal relaxation  Clinical Impression  Clinical Impression Patient presents with mild-moderate oropharyngeal  dysphagia. Oral stage is characterized by weak, disorganized lingual  transport, with bolus collection in the left lateral sulcus and lingual  pumping, piecemealing noted. Able to clear oral residue with additional  swallow. Swallow initiation occurs at the level of the base of tongue.  Pharyngeal stage of the swallow is characterized by mildly reduced base of  tongue retraction and mildly reduced duration of hyolaryngeal excursion,  leading to mild-moderate residue in the pyriform sinuses (greater residue  with thicker consistencies. This reduces but does not fully clear with  subsequent swallow. Testing was limited by texture sensitivity;  honey-thick and pudding-thick textures elicited gag response while still  on anterior tongue; pt expectorated. There is no laryngeal penetration or  aspiration observed on this examination. Patient and mother were educated  on findings and recommendations using video for teachback and written  handout was provided. Educated on impact of fatigue with exertion on  swallow function, as well as  signs/symptoms to monitor (overt signs of  aspiration, signs of aspiration pneumonia). Recommend pt continue to  collaborate with care team (ALS clinic, [facility] SLP) to monitor for changes in  swallow function and make decisions re: comfort feeds. Based on today's  presentation it appears appropriate for pt to continue pleasure feeds of  soft solids and thin liquids.  SLP Visit Diagnosis Dysphagia, oropharyngeal phase (R13.12)  Impact on safety and function Mild aspiration risk;Moderate aspiration  risk;Risk for inadequate nutrition/hydration (fatigue increases risk)  Swallow Evaluation Recommendations  SLP Diet Recommendations Other (Comment);Dysphagia 3 (Mech soft)  solids;Thin liquid (via PEG, supplemented by pleasure feeds of soft solids, thin liquids)  Liquid Administration via Cup;Straw  Medication Administration Via alternative means (or one at a time if by mouth necessary)  Supervision Patient able to self feed  Compensations Slow rate;Small sips/bites  Postural Changes Remain semi-upright after after feeds/meals  (Comment);Seated upright at 90 degrees  Treatment Plan  Oral Care Recommendations Oral care BID  Treatment Recommendations Defer treatment plan to f/u with SLP  Follow Up Recommendations Home health SLP  Individuals Consulted  Consulted and Agree with Results and Recommendations Patient;Family  member/caregiver  Family Member Consulted mother  Report Sent to  Referring physician  Progression Toward Goals  Progression toward goals Goals met, education completed, patient  discharged from SLP  SLP Evaluations  $ SLP Speech Visit 1 Visit  SLP Evaluations  $Outpatient MBS Swallow 1 Procedure   Deneise Lever, MS, CCC-SLP Speech-Language Pathologist [phone] Note: Reviewed        Physical Exam  General appearance: Well nourished, well developed, and well hydrated. In no apparent acute distress Mental status: Alert, oriented x 3 (person, place, & time)        Respiratory: No evidence of acute respiratory distress Eyes: PERLA Vitals: BP 112/81   Pulse 96   Temp 98.4 F (36.9 C)   Resp 16   Ht 5' 10"  (1.778 m)   Wt 122 lb (55.3 kg)   SpO2 99%   BMI 17.51 kg/m  BMI: Estimated body mass index is 17.51 kg/m as calculated from the following:   Height as of this encounter: 5' 10"  (1.778 m).   Weight as of this encounter: 122 lb (55.3 kg). Ideal: Ideal body weight: 73 kg (160 lb 15 oz)  Lumbar Spine Area Exam  Skin & Axial Inspection: Well healed scar from previous spine surgery detected Alignment: Symmetrical Functional ROM: Decreased ROM       Stability: No instability detected Muscle Tone/Strength: Increased muscle tone over affected area Sensory (Neurological): Musculoskeletal pain pattern   Gait & Posture Assessment  Ambulation: Patient came in today in a wheel chair Gait: Significantly limited. Dependent on assistive device to ambulate Posture: Difficulty standing up straight, due to pain   Lower Extremity Exam      Side: Right lower extremity   Side: Left lower extremity  Stability: No instability observed           Stability: No instability observed          Skin & Extremity Inspection: Skin color, temperature, and hair growth are WNL. No peripheral edema or cyanosis. No masses, redness, swelling, asymmetry, or associated skin lesions. No contractures.   Skin & Extremity Inspection: Skin color, temperature, and hair growth are WNL. No peripheral edema or cyanosis. No masses, redness, swelling, asymmetry, or associated skin lesions. No contractures.  Functional ROM: Pain restricted ROM for all joints of the lower extremity           Functional ROM: Pain restricted ROM for all joints of the lower extremity          Muscle Tone/Strength: Generalized lower extremity weakness   Muscle Tone/Strength: Generalized lower extremity weakness  Sensory (Neurological): Neurogenic pain pattern         Sensory (Neurological): Neurogenic pain  pattern        DTR: Patellar: deferred today Achilles: deferred today Plantar: deferred today   DTR: Patellar: deferred today Achilles: deferred today Plantar: deferred today  Palpation: No palpable anomalies   Palpation: No palpable anomalies    4- out of 5 strength bilateral lower extremity: Plantar flexion, dorsiflexion, knee flexion, knee extension.  Assessment   Diagnosis Status  1. Chronic pain syndrome   2. ALS (amyotrophic lateral sclerosis) ([place])   3.  Compression fracture of L2 vertebra, initial encounter ([place])   4. Closed compression fracture of body of L1 vertebra ([facility])   5. Pain in both lower legs    Persistent Worsening Persistent   Updated Problems: Problem  Chronic Pain Syndrome  Pain Management Contract Signed  Closed Compression Fracture of Body of L1 Vertebra ([facility])  Compression Fracture of L2 ([facility])  Pain in Both Lower Legs  Als (Amyotrophic Lateral Sclerosis) ([facility])     Plan of Care   Shawn [name] has a current medication list which includes the following long-term medication(s): atorvastatin, gabapentin, montelukast, and trazodone.  Pharmacotherapy (Medications Ordered): Meds ordered this encounter  Medications   HYDROcodone-acetaminophen (HYCET) 7.5-325 mg/15 ml solution    Sig: Take 15 mLs by mouth every 12 (twelve) hours as needed for moderate pain.    Dispense:  900 mL    Refill:  0   Orders:  Orders Placed This Encounter  Procedures   Amb Referral to Palliative Care    Referral Priority:   Routine    Referral Type:   Consultation    Referral Reason:   Advance Care Planning    Number of Visits Requested:   1   Follow-up plan:   Return in about 4 weeks (around 08/23/2022) for Medication Management, in person.    Recent Visits Date Type Provider Dept  06/28/22 Office Visit Gillis Santa, MD [facility]  06/14/22 Office Visit Gillis Santa, MD [facility]  Showing recent visits within past 90 days and  meeting all other requirements Today's Visits Date Type Provider Dept  07/26/22 Office Visit Gillis Santa, MD [facility]  Showing today's visits and meeting all other requirements Future Appointments Date Type Provider Dept  08/21/22 Appointment Gillis Santa, MD [facility]  Showing future appointments within next 90 days and meeting all other requirements  I discussed the assessment and treatment plan with the patient. The patient was provided an opportunity to ask questions and all were answered. The patient agreed with the plan and demonstrated an understanding of the instructions.  Patient advised to call back or seek an in-person evaluation if the symptoms or condition worsens.  Duration of encounter: 30 minutes.  Total time on encounter, as per AMA guidelines included both the face-to-face and non-face-to-face time personally spent by the physician and/or other qualified health care professional(s) on the day of the encounter (includes time in activities that require the physician or other qualified health care professional and does not include time in activities normally performed by clinical staff). Physician's time may include the following activities when performed: preparing to see the patient (eg, review of tests, pre-charting review of records) obtaining and/or reviewing separately obtained history performing a medically appropriate examination and/or evaluation counseling and educating the patient/family/caregiver ordering medications, tests, or procedures referring and communicating with other health care professionals (when not separately reported) documenting clinical information in the electronic or other health record independently interpreting results (not separately reported) and communicating results to the patient/ family/caregiver care coordination (not separately reported)  Note by: Gillis Santa, MD Date: 07/26/2022; Time: 2:57 PM

## 2022-07-26 NOTE — Telephone Encounter (Signed)
Patient does not meet the current criteria for palliative care. Just wanted to let you know.

## 2022-07-27 ENCOUNTER — Other Ambulatory Visit: Payer: Self-pay

## 2022-07-27 MED ORDER — HYDROCODONE-ACETAMINOPHEN 7.5-325 MG/15ML PO SOLN: 15.0000 mL | Freq: Two times a day (BID) | ORAL | 0 refills | Status: DC | PRN

## 2022-07-27 NOTE — Telephone Encounter (Signed)
Dr Holley Raring is not in the office today.  Refill request sent to Dr Dossie Arbour

## 2022-07-27 NOTE — Telephone Encounter (Signed)
Patient's mother called back. [facility] has medication and will fill as soon as they get a script sent to them. Please let patient know if this is possible

## 2022-08-21 ENCOUNTER — Ambulatory Visit
Payer: Medicare Other | Attending: Student in an Organized Health Care Education/Training Program | Admitting: Student in an Organized Health Care Education/Training Program

## 2022-08-21 ENCOUNTER — Encounter: Payer: Self-pay | Admitting: Student in an Organized Health Care Education/Training Program

## 2022-08-21 VITALS — BP 113/74 | HR 80 | Temp 97.3°F | Ht 70.0 in | Wt 122.0 lb

## 2022-08-21 DIAGNOSIS — S32020A Wedge compression fracture of second lumbar vertebra, initial encounter for closed fracture: Secondary | ICD-10-CM

## 2022-08-21 DIAGNOSIS — M79662 Pain in left lower leg: Secondary | ICD-10-CM

## 2022-08-21 DIAGNOSIS — M79661 Pain in right lower leg: Secondary | ICD-10-CM | POA: Diagnosis present

## 2022-08-21 DIAGNOSIS — G1221 Amyotrophic lateral sclerosis: Secondary | ICD-10-CM

## 2022-08-21 DIAGNOSIS — S32010A Wedge compression fracture of first lumbar vertebra, initial encounter for closed fracture: Secondary | ICD-10-CM | POA: Diagnosis present

## 2022-08-21 DIAGNOSIS — G894 Chronic pain syndrome: Secondary | ICD-10-CM

## 2022-08-21 DIAGNOSIS — Z0289 Encounter for other administrative examinations: Secondary | ICD-10-CM

## 2022-08-21 MED ORDER — HYDROCODONE-ACETAMINOPHEN 5-325 MG PO TABS: 1.0000 | ORAL_TABLET | Freq: Three times a day (TID) | ORAL | 0 refills | Status: DC | PRN

## 2022-08-21 MED ORDER — HYDROCODONE-ACETAMINOPHEN 5-325 MG PO TABS: 1.0000 | ORAL_TABLET | Freq: Three times a day (TID) | ORAL | 0 refills | Status: AC | PRN

## 2022-08-21 NOTE — Progress Notes (Signed)
Safety precautions to be maintained throughout the outpatient stay will include: orient to surroundings, keep bed in low position, maintain call bell within reach at all times, provide assistance with transfer out of bed and ambulation.  

## 2022-08-21 NOTE — Progress Notes (Signed)
PROVIDER NOTE: Information contained herein reflects review and annotations entered in association with encounter. Interpretation of such information and data should be left to medically-trained personnel. Information provided to patient can be located elsewhere in the medical record under "Patient Instructions". Document created using STT-dictation technology, any transcriptional errors that may result from process are unintentional.    Patient: Shawn [name]  Service Category: E/M  Provider: Gillis Santa, MD  DOB: 07-Mar-1961  DOS: 08/21/2022  Referring Provider: [facility] Service*  MRN: [id number]  Specialty: Interventional Pain Management  PCP: [place]  Type: Established Patient  Setting: Ambulatory outpatient    Location: Office  Delivery: Face-to-face     HPI  [name], a 61 y.o. year old male, is here today because of his Chronic pain syndrome [G89.4]. Mr. Salazar primary complain today is Back Pain (lower) Last encounter: My last encounter with him was on 07/19/2022. Pertinent problems: Mr. Toste has Compression fracture of L2 ([place]); Pain in both lower legs; ALS (amyotrophic lateral sclerosis) ([place]); Closed compression fracture of body of L1 vertebra ([place]); Chronic pain syndrome; and Pain management contract signed on their pertinent problem list. Pain Assessment: Severity of Chronic pain is reported as a 4 /10. Location: Back Left, Right/pain radiaties. Onset: More than a month ago. Quality: Aching, Burning, Constant, Throbbing, Stabbing, Sharp, Shooting. Timing: Constant. Modifying factor(s): nothing. Vitals:  height is _0  (1.778 m) and weight is 122 lb (55.3 kg). His temperature is 97.3 F (36.3 C) (abnormal). His blood pressure is 113/74 and his pulse is 80. His oxygen saturation is 98%.   Reason for encounter: medication management.  [name] presents today for medication management.  He is somewhat frustrated with his constant and severe  pain.  He has an allergy to tramadol.  Unfortunately buprenorphine was not approved.  Although we were hoping to avoid hydrocodone, at this point, Wilhelm has very limited options from an oral pharmacologic standpoint.  We had discussed hydrocodone 7.5 mg twice daily as needed which will be a elixir given that he has trouble swallowing secondary to his ALS however he has been having trouble filling this at the pharmacy due to lack of inventory. I will prescribe hydrocodone 5 mg p.o. tablet 3 times a day as needed.  Consider palliative care referral next year.   06/28/22 No significant change in his medical history other than he went to the urgent care on 06/19/2022 for increased low back pain as a result of his compression fractures.  Today was prescribed a small quantity of hydrocodone. Huey was honest with me before and told me that his urine toxicology screen will be positive for THC.  He states that he has since stopped utilizing THC.  We will obtain a another urine toxicology screen at his next visit.  This should be negative for THC.   We discussed hydrocodone versus buprenorphine for chronic pain management.  I recommend a trial of buprenorphine, belbuca as this is safer, has a longer duration of analgesic effect and has less overall side effects.  We will hope to get preauthorization for this.     HPI from initial clinic visit 06/14/22: Shawn [name] is a pleasant 61 year old male who presents with a chief complaint of low back pain.  He has a history of L1 and L2 compression fracture.  His L1 compression fracture occurred on 04/05/2022 after a fall.  He has a history of L2 compression fracture status post kyphoplasty.  He does have a history of  ALS unfortunately.  He has progressive shortness of breath, difficulty walking and severe weakness.  He recently had a G-tube placed last month.  He is here primarily for medication management/palliative care of chronic low back pain secondary to L1 compression  fracture.  He states that he has found benefit with hydrocodone compared to oxycodone.  He was very honest with me and states that his urine toxicology screen will be positive for THC as he did smoke last month.  He states that he is no longer utilizing THC.    Pharmacotherapy Assessment  Analgesic: Hydrocodone 5 mg p.o. every 8 hours as needed  Monitoring: [place] PMP: PDMP reviewed during this encounter.       Pharmacotherapy: No side-effects or adverse reactions reported. Compliance: No problems identified. Effectiveness: Clinically acceptable.  Chauncey Fischer, RN  08/21/2022  2:09 PM  Sign when Signing Visit Safety precautions to be maintained throughout the outpatient stay will include: orient to surroundings, keep bed in low position, maintain call bell within reach at all times, provide assistance with transfer out of bed and ambulation.     No results found for: "CBDTHCR" No results found for: "D8THCCBX" No results found for: "D9THCCBX"  UDS:  Summary  Date Value Ref Range Status  06/14/2022 Note  Final    Comment:    ==================================================================== Compliance Drug Analysis, Ur ==================================================================== Test                             Result       Flag       Units  Drug Present and Declared for Prescription Verification   Gabapentin                     PRESENT      EXPECTED   Baclofen                       PRESENT      EXPECTED  Drug Present not Declared for Prescription Verification   Carboxy-THC                    262          UNEXPECTED ng/mg creat    Carboxy-THC is a metabolite of tetrahydrocannabinol (THC). Source of    THC is most commonly herbal marijuana or marijuana-based products,    but THC is also present in a scheduled prescription medication.    Trace amounts of THC can be present in hemp and cannabidiol (CBD)    products. This test is not intended to distinguish between delta-9-     tetrahydrocannabinol, the predominant form of THC in most herbal or    marijuana-based products, and delta-8-tetrahydrocannabinol.  Drug Absent but Declared for Prescription Verification   Trazodone                      Not Detected UNEXPECTED   Acetaminophen                  Not Detected UNEXPECTED    Acetaminophen, as indicated in the declared medication list, is not    always detected even when used as directed.  ==================================================================== Test                      Result    Flag   Units      Ref Range   Creatinine  34               mg/dL      >=20 ==================================================================== Declared Medications:  The flagging and interpretation on this report are based on the  following declared medications.  Unexpected results may arise from  inaccuracies in the declared medications.   **Note: The testing scope of this panel includes these medications:   Baclofen  Gabapentin (Neurontin)  Trazodone (Desyrel)   **Note: The testing scope of this panel does not include small to  moderate amounts of these reported medications:   Acetaminophen (Tylenol)   **Note: The testing scope of this panel does not include the  following reported medications:   Albuterol (Ventolin HFA)  Atorvastatin (Lipitor)  Fluticasone (Breo)  Montelukast (Singulair)  Riluzole  Vilanterol (Breo) ==================================================================== For clinical consultation, please call [phone]. ====================================================================       ROS  Constitutional:  Weakness Gastrointestinal: No reported hemesis, hematochezia, vomiting, or acute GI distress Musculoskeletal:  Diffuse arthralgias and myalgias Neurological:  Lower extremity paresthesias  Medication Review  Baclofen, HYDROcodone-acetaminophen, Phenylbutyrate-Taurursodiol, acetaminophen, albuterol,  atorvastatin, fluticasone furoate-vilanterol, gabapentin, montelukast, riluzole, and traZODone  History Review  Allergy: Mr. Doshi is allergic to aspirin, compazine [prochlorperazine], morphine and related, naproxen, penicillins, and tramadol. Drug: Mr. Ostermiller  reports no history of drug use. Alcohol:  reports that he does not currently use alcohol. Tobacco:  reports that he has never smoked. He has never used smokeless tobacco. Social: Mr. Remer  reports that he has never smoked. He has never used smokeless tobacco. He reports that he does not currently use alcohol. He reports that he does not use drugs. Medical:  has a past medical history of ALS (amyotrophic lateral sclerosis) ([place]), Asthma, Coronary artery disease, Hyperlipidemia, Renal stone, and Stroke ([place]). Surgical: Mr. Harriott  has a past surgical history that includes Cystoscopy with stent placement (Left, 05/20/2015); Knee surgery (Right, 84,96, 2003); Shoulder surgery (Left, 2010); Cardiac catheterization (2006); Cystoscopy/retrograde/ureteroscopy/stone extraction with basket (2000); Cystoscopy/ureteroscopy/holmium laser (Left, 06/11/2015); Cystoscopy w/ ureteral stent removal (Left, 06/11/2015); and IR KYPHO LUMBAR INC FX REDUCE BONE BX UNI/BIL CANNULATION INC/IMAGING (01/06/2022). Family: family history includes CVA in his mother; Cancer in his mother; Diabetes in an other family member; Heart failure in his mother; Hypertension in his mother; Other in his father.  Laboratory Chemistry Profile   Renal Lab Results  Component Value Date   BUN 13 04/04/2022   CREATININE 0.50 (L) 04/04/2022   GFRAA >60 05/20/2015   GFRNONAA >60 04/04/2022    Hepatic Lab Results  Component Value Date   AST 28 04/03/2022   ALT 33 04/03/2022   ALBUMIN 4.4 04/03/2022   ALKPHOS 101 04/03/2022   LIPASE 27 05/20/2015    Electrolytes Lab Results  Component Value Date   NA 138 04/04/2022   K 3.8 04/04/2022   CL 112 (H) 04/04/2022   CALCIUM 8.5  (L) 04/04/2022    Bone No results found for: "VD25OH", "VD125OH2TOT", "CW8889VQ9", "IH0388EK8", "25OHVITD1", "25OHVITD2", "25OHVITD3", "TESTOFREE", "TESTOSTERONE"  Inflammation (CRP: Acute Phase) (ESR: Chronic Phase) No results found for: "CRP", "ESRSEDRATE", "LATICACIDVEN"       Note: Above Lab results reviewed.  Recent Imaging Review  DG SWALLOW FUNC OP MEDICARE SPEECH PATH Table formatting from the original result was not included. CLINICAL DATA:  Patient with history of ALS, CVA with dysphagia. Presents for modified swallowing study with Speech Therapy  EXAM: MODIFIED BARIUM SWALLOW  TECHNIQUE: Different consistencies of barium were administered orally to the patient by the Speech Pathologist. Imaging  of the pharynx was performed in the lateral projection. By Rushie Nyhan NP was present in the fluoroscopy room for this study. The radiologist provided personal supervision  FLUOROSCOPY: Radiation Exposure Index (as provided by the fluoroscopic device): 16.9 mGy [place]  COMPARISON:  None Available.  FINDINGS: Vestibular  Penetration: None seen.  Aspiration:  None seen.  Other:  Patient unable to tolerate honey thickened or pudding  IMPRESSION: No vestibular penetration or aspiration seen.  Please refer to the Speech Pathologists report for complete details and recommendations.  This exam was performed by Rushie Nyhan N/P. And was supervised by Dr. Yetta Glassman  Electronically Signed   By: Yetta Glassman M.D.   On: 07/10/2022 14:17   07/10/22 1400  SLP Visit Information  SLP Received On 07/10/22  Subjective  Subjective Pt reports coughing with intake, worse on some days vs others.  Also complains of consistent globus sensation  Patient/Family Stated Goal continue to eat for pleasure  Pain Assessment  Pain Assessment No/denies pain  General Information  Date of Onset 07/10/22  HPI Patient is a 61 y.o. 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 Past medical history also  includes CVA (however, MRI brain 05/07/20 showed "no evidence of acute or  chronic infarct", asthma, frontotemporal dementia,  Pt had MBS in 2021,  prior to ALS diagnosis with findings of mild oropharyngeal dysphagia, with  residue (L>r) after the swallow. He has been working with [facility] SLP; has  G-tube and currently takes some medications as well as 5 bottles via tube  per day, consuming soft solids, thin liquids for pleasure by mouth, as  well as some medications which he's unable to crush. Pt denies recent  history of pneumonia; he has not needed the Heimlich maneuver. He reports  intermittent signs of dysphagia including coughing with intake.  Type of Study MBS-Modified Barium Swallow Study  Previous Swallow Assessment see HPI  Diet Prior to this Study Dysphagia 3 (soft);Thin liquids  Temperature Spikes Noted No  Respiratory Status Room air  History of Recent Intubation No  Behavior/Cognition Alert;Cooperative  Oral Cavity Assessment WFL  Oral Care Completed by SLP No  Oral Cavity - Dentition Other (Comment) (reports some pain due to dental abscess that was worked on last week)  Insurance claims handler for self feeding  Self-Feeding Abilities Able to feed self  Patient Positioning Upright in chair  Baseline Vocal Quality Other (comment) (low pitch, strained quality)  Volitional Cough Other (Comment) (strength reduced)  Volitional Swallow Able to elicit  Anatomy WFL  Pharyngeal Secretions Not observed secondary MBS  Oral Motor/Sensory Function  Overall Oral Motor/Sensory Function Moderate impairment  Facial ROM Reduced right;Reduced left  Facial Symmetry WFL  Lingual ROM Reduced right;Reduced left  Lingual Symmetry WFL  Lingual Strength Reduced  Oral Preparation/Oral Phase  Oral Phase Impaired  Oral - Pudding  Oral - Pudding Teaspoon Weak lingual manipulation;Other (Comment) (gags and  expectorates)  Oral - Honey  Oral - Honey Teaspoon Weak lingual manipulation;Other (Comment) (gags and exectorates)  Oral - Nectar  Oral - Nectar Teaspoon Weak lingual manipulation;Lingual pumping;Reduced  posterior propulsion;Lingual/palatal residue;Piecemeal  swallowing;Decreased bolus cohesion  Oral - Nectar Cup Weak lingual manipulation;Lingual pumping;Reduced  posterior propulsion;Lingual/palatal residue;Piecemeal  swallowing;Decreased bolus cohesion  Oral - Thin  Oral - Thin Teaspoon Weak lingual manipulation;Lingual pumping;Reduced  posterior propulsion;Lingual/palatal residue;Piecemeal  swallowing;Decreased bolus cohesion  Oral - Thin Cup Weak lingual manipulation;Lingual pumping;Reduced  posterior propulsion;Lingual/palatal residue;Piecemeal  swallowing;Decreased bolus cohesion  Oral - Thin Straw Weak lingual manipulation;Lingual pumping;Reduced  posterior propulsion;Lingual/palatal residue;Piecemeal  swallowing;Decreased bolus cohesion  Pharyngeal Phase  Pharyngeal Phase Impaired  Pharyngeal - Pudding  Pharyngeal- Pudding Teaspoon Other (Comment) (n/a- expectorated)  Pharyngeal Material does not enter airway  Pharyngeal - Honey  Pharyngeal- Honey Teaspoon Other (Comment) (n/a- expectorated)  Pharyngeal Material does not enter airway  Pharyngeal - Nectar  Pharyngeal- Nectar Teaspoon Reduced tongue base retraction;Reduced  anterior laryngeal mobility;Pharyngeal residue - pyriform  Pharyngeal Material does not enter airway  Pharyngeal- Nectar Cup Reduced tongue base retraction;Reduced anterior  laryngeal mobility;Pharyngeal residue - pyriform;Reduced laryngeal  elevation  Pharyngeal Material does not enter airway  Pharyngeal - Thin  Pharyngeal- Thin Teaspoon Reduced tongue base retraction;Reduced anterior  laryngeal mobility;Pharyngeal residue - pyriform  Pharyngeal Material does not enter airway  Pharyngeal- Thin Cup Reduced tongue base retraction;Reduced anterior   laryngeal mobility;Pharyngeal residue - pyriform  Pharyngeal Material does not enter airway  Pharyngeal- Thin Straw Reduced tongue base retraction;Reduced anterior  laryngeal mobility;Pharyngeal residue - pyriform  Pharyngeal Material does not enter airway  Pharyngeal - Solids  Pharyngeal- Regular Reduced tongue base retraction;Reduced anterior  laryngeal mobility;Pharyngeal residue - pyriform  Pharyngeal Material does not enter airway  Cervical Esophageal Phase  Cervical Esophageal Phase Impaired  Cervical Esophageal Phase - Solids  Regular Reduced cricopharyngeal relaxation  Clinical Impression  Clinical Impression Patient presents with mild-moderate oropharyngeal  dysphagia. Oral stage is characterized by weak, disorganized lingual  transport, with bolus collection in the left lateral sulcus and lingual  pumping, piecemealing noted. Able to clear oral residue with additional  swallow. Swallow initiation occurs at the level of the base of tongue.  Pharyngeal stage of the swallow is characterized by mildly reduced base of  tongue retraction and mildly reduced duration of hyolaryngeal excursion,  leading to mild-moderate residue in the pyriform sinuses (greater residue  with thicker consistencies. This reduces but does not fully clear with  subsequent swallow. Testing was limited by texture sensitivity;  honey-thick and pudding-thick textures elicited gag response while still  on anterior tongue; pt expectorated. There is no laryngeal penetration or  aspiration observed on this examination. Patient and mother were educated  on findings and recommendations using video for teachback and written  handout was provided. Educated on impact of fatigue with exertion on  swallow function, as well as signs/symptoms to monitor (overt signs of  aspiration, signs of aspiration pneumonia). Recommend pt continue to  collaborate with care team (ALS clinic, [facility] SLP) to monitor for changes in  swallow  function and make decisions re: comfort feeds. Based on today's  presentation it appears appropriate for pt to continue pleasure feeds of  soft solids and thin liquids.  SLP Visit Diagnosis Dysphagia, oropharyngeal phase (R13.12)  Impact on safety and function Mild aspiration risk;Moderate aspiration  risk;Risk for inadequate nutrition/hydration (fatigue increases risk)  Swallow Evaluation Recommendations  SLP Diet Recommendations Other (Comment);Dysphagia 3 (Mech soft)  solids;Thin liquid (via PEG, supplemented by pleasure feeds of soft solids, thin liquids)  Liquid Administration via Cup;Straw  Medication Administration Via alternative means (or one at a time if by mouth necessary)  Supervision Patient able to self feed  Compensations Slow rate;Small sips/bites  Postural Changes Remain semi-upright after after feeds/meals  (Comment);Seated upright at 90 degrees  Treatment Plan  Oral Care Recommendations Oral care BID  Treatment Recommendations Defer treatment plan to f/u with SLP  Follow Up Recommendations Home health SLP  Individuals Consulted  Consulted and Agree with Results and Recommendations  Patient;Family  member/caregiver  Family Member Consulted mother  Report Sent to  Referring physician  Progression Toward Goals  Progression toward goals Goals met, education completed, patient  discharged from SLP  SLP Evaluations  $ SLP Speech Visit 1 Visit  SLP Evaluations  $Outpatient MBS Swallow 1 Procedure   Deneise Lever, MS, CCC-SLP Speech-Language Pathologist [phone] Note: Reviewed        Physical Exam  General appearance: Well nourished, well developed, and well hydrated. In no apparent acute distress Mental status: Alert, oriented x 3 (person, place, & time)       Respiratory: No evidence of acute respiratory distress Eyes: PERLA Vitals: BP 113/74   Pulse 80   Temp (!) 97.3 F (36.3 C)   Ht _0  (1.778 m)   Wt 122 lb (55.3 kg)   SpO2 98%   BMI  17.51 kg/m  BMI: Estimated body mass index is 17.51 kg/m as calculated from the following:   Height as of this encounter: _1  (1.778 m).   Weight as of this encounter: 122 lb (55.3 kg). Ideal: Ideal body weight: 73 kg (160 lb 15 oz)  Lumbar Spine Area Exam  Skin & Axial Inspection: Well healed scar from previous spine surgery detected Alignment: Symmetrical Functional ROM: Decreased ROM       Stability: No instability detected Muscle Tone/Strength: Increased muscle tone over affected area Sensory (Neurological): Musculoskeletal pain pattern   Gait & Posture Assessment  Ambulation: Patient came in today in a wheel chair Gait: Significantly limited. Dependent on assistive device to ambulate Posture: Difficulty standing up straight, due to pain   Lower Extremity Exam      Side: Right lower extremity   Side: Left lower extremity  Stability: No instability observed           Stability: No instability observed          Skin & Extremity Inspection: Skin color, temperature, and hair growth are WNL. No peripheral edema or cyanosis. No masses, redness, swelling, asymmetry, or associated skin lesions. No contractures.   Skin & Extremity Inspection: Skin color, temperature, and hair growth are WNL. No peripheral edema or cyanosis. No masses, redness, swelling, asymmetry, or associated skin lesions. No contractures.  Functional ROM: Pain restricted ROM for all joints of the lower extremity           Functional ROM: Pain restricted ROM for all joints of the lower extremity          Muscle Tone/Strength: Generalized lower extremity weakness   Muscle Tone/Strength: Generalized lower extremity weakness  Sensory (Neurological): Neurogenic pain pattern         Sensory (Neurological): Neurogenic pain pattern        DTR: Patellar: deferred today Achilles: deferred today Plantar: deferred today   DTR: Patellar: deferred today Achilles: deferred today Plantar: deferred today  Palpation: No palpable  anomalies   Palpation: No palpable anomalies    4- out of 5 strength bilateral lower extremity: Plantar flexion, dorsiflexion, knee flexion, knee extension.  Assessment   Diagnosis Status  1. Chronic pain syndrome   2. ALS (amyotrophic lateral sclerosis) ([place])   3. Compression fracture of L2 vertebra, initial encounter ([place])   4. Closed compression fracture of body of L1 vertebra ([facility])   5. Pain in both lower legs   6. Pain management contract signed    Persistent Worsening Persistent     Plan of Care   Mr. EUGUNE SINE has a current medication list  which includes the following long-term medication(s): atorvastatin, gabapentin, montelukast, and trazodone.  Pharmacotherapy (Medications Ordered): Meds ordered this encounter  Medications   HYDROcodone-acetaminophen (NORCO/VICODIN) 5-325 MG tablet    Sig: Take 1 tablet by mouth every 8 (eight) hours as needed for severe pain. Must last 30 days.    Dispense:  90 tablet    Refill:  0    Chronic Pain: STOP Act (Not applicable) Fill 1 day early if closed on refill date. Avoid benzodiazepines within 8 hours of opioids   HYDROcodone-acetaminophen (NORCO/VICODIN) 5-325 MG tablet    Sig: Take 1 tablet by mouth every 8 (eight) hours as needed for severe pain. Must last 30 days.    Dispense:  90 tablet    Refill:  0    Chronic Pain: STOP Act (Not applicable) Fill 1 day early if closed on refill date. Avoid benzodiazepines within 8 hours of opioids   Orders:  No orders of the defined types were placed in this encounter.  Follow-up plan:   Return in about 8 weeks (around 10/16/2022) for Medication Management, in person.    Recent Visits Date Type Provider Dept  07/26/22 Office Visit Gillis Santa, MD [facility]  06/28/22 Office Visit Gillis Santa, MD [facility]  06/14/22 Office Visit Gillis Santa, MD [facility]  Showing recent visits within past 90 days and meeting all other requirements Today's  Visits Date Type Provider Dept  08/21/22 Office Visit Gillis Santa, MD [facility]  Showing today's visits and meeting all other requirements Future Appointments Date Type Provider Dept  10/12/22 Appointment Gillis Santa, MD [facility]  Showing future appointments within next 90 days and meeting all other requirements  I discussed the assessment and treatment plan with the patient. The patient was provided an opportunity to ask questions and all were answered. The patient agreed with the plan and demonstrated an understanding of the instructions.  Patient advised to call back or seek an in-person evaluation if the symptoms or condition worsens.  Duration of encounter: 30 minutes.  Total time on encounter, as per AMA guidelines included both the face-to-face and non-face-to-face time personally spent by the physician and/or other qualified health care professional(s) on the day of the encounter (includes time in activities that require the physician or other qualified health care professional and does not include time in activities normally performed by clinical staff). Physician's time may include the following activities when performed: preparing to see the patient (eg, review of tests, pre-charting review of records) obtaining and/or reviewing separately obtained history performing a medically appropriate examination and/or evaluation counseling and educating the patient/family/caregiver ordering medications, tests, or procedures referring and communicating with other health care professionals (when not separately reported) documenting clinical information in the electronic or other health record independently interpreting results (not separately reported) and communicating results to the patient/ family/caregiver care coordination (not separately reported)  Note by: Gillis Santa, MD Date: 08/21/2022; Time: 3:31 PM

## 2022-10-12 ENCOUNTER — Ambulatory Visit
Payer: Medicare Other | Attending: Student in an Organized Health Care Education/Training Program | Admitting: Student in an Organized Health Care Education/Training Program

## 2022-10-12 ENCOUNTER — Encounter: Payer: Self-pay | Admitting: Student in an Organized Health Care Education/Training Program

## 2022-10-12 VITALS — BP 114/74 | HR 69 | Temp 97.3°F | Ht 70.0 in | Wt 122.0 lb

## 2022-10-12 DIAGNOSIS — G894 Chronic pain syndrome: Secondary | ICD-10-CM | POA: Diagnosis present

## 2022-10-12 DIAGNOSIS — S32020D Wedge compression fracture of second lumbar vertebra, subsequent encounter for fracture with routine healing: Secondary | ICD-10-CM | POA: Diagnosis not present

## 2022-10-12 DIAGNOSIS — M79661 Pain in right lower leg: Secondary | ICD-10-CM

## 2022-10-12 DIAGNOSIS — S32010A Wedge compression fracture of first lumbar vertebra, initial encounter for closed fracture: Secondary | ICD-10-CM | POA: Diagnosis present

## 2022-10-12 DIAGNOSIS — Z0289 Encounter for other administrative examinations: Secondary | ICD-10-CM | POA: Insufficient documentation

## 2022-10-12 DIAGNOSIS — S32010D Wedge compression fracture of first lumbar vertebra, subsequent encounter for fracture with routine healing: Secondary | ICD-10-CM | POA: Diagnosis not present

## 2022-10-12 DIAGNOSIS — S32020A Wedge compression fracture of second lumbar vertebra, initial encounter for closed fracture: Secondary | ICD-10-CM | POA: Diagnosis present

## 2022-10-12 DIAGNOSIS — M79662 Pain in left lower leg: Secondary | ICD-10-CM

## 2022-10-12 DIAGNOSIS — G1221 Amyotrophic lateral sclerosis: Secondary | ICD-10-CM

## 2022-10-12 MED ORDER — HYDROCODONE-ACETAMINOPHEN 5-325 MG PO TABS: 1.0000 | ORAL_TABLET | Freq: Three times a day (TID) | ORAL | 0 refills | Status: AC | PRN

## 2022-10-12 NOTE — Progress Notes (Signed)
Nursing Pain Medication Assessment:  Safety precautions to be maintained throughout the outpatient stay will include: orient to surroundings, keep bed in low position, maintain call bell within reach at all times, provide assistance with transfer out of bed and ambulation.  Medication Inspection Compliance: Pill count conducted under aseptic conditions, in front of the patient. Neither the pills nor the bottle was removed from the patient's sight at any time. Once count was completed pills were immediately returned to the patient in their original bottle.  Medication: Hydrocodone/APAP Pill/Patch Count:  54 of 90 pills remain Pill/Patch Appearance: Markings consistent with prescribed medication Bottle Appearance: Standard pharmacy container. Clearly labeled. Filled Date: 59 / 20 / 2023 Last Medication intake:  TodaySafety precautions to be maintained throughout the outpatient stay will include: orient to surroundings, keep bed in low position, maintain call bell within reach at all times, provide assistance with transfer out of bed and ambulation.

## 2022-10-12 NOTE — Progress Notes (Signed)
PROVIDER NOTE: Information contained herein reflects review and annotations entered in association with encounter. Interpretation of such information and data should be left to medically-trained personnel. Information provided to patient can be located elsewhere in the medical record under "Patient Instructions". Document created using STT-dictation technology, any transcriptional errors that may result from process are unintentional.    Patient: Shawn [name]  Service Category: E/M  Provider: Gillis Santa, MD  DOB: 22-Jan-1961  DOS: 10/12/2022  Referring Provider: [facility]*  MRN: [id number]  Specialty: Interventional Pain Management  PCP: [facility]  Type: Established Patient  Setting: Ambulatory outpatient    Location: Office  Delivery: Face-to-face     HPI  [name], a 62 y.o. year old male, is here today because of his Chronic pain syndrome [G89.4]. [name] primary complain today is Hip Pain (right) Last encounter: My last encounter with him was on 08/21/22 Pertinent problems: Shawn [name] has Compression fracture of L2 ([place]); Pain in both lower legs; ALS (amyotrophic lateral sclerosis) ([place]); Closed compression fracture of body of L1 vertebra ([place]); Chronic pain syndrome; and Pain management contract signed on their pertinent problem list. Pain Assessment: Severity of Chronic pain is reported as a 8 /10. Location: Hip (right hip and arm, new pain, fell) Right/pain radiaties on his right side. Onset: More than a month ago. Quality: Aching, Burning, Constant, Throbbing, Sore. Timing: Constant. Modifying factor(s): Meds and laying down. Vitals:  height is 5\' 10"  (1.778 m) and weight is 122 lb (55.3 kg). His temperature is 97.3 F (36.3 C) (abnormal). His blood pressure is 114/74 and his pulse is 69. His oxygen saturation is 98%.   Reason for encounter: medication management. Is finding benefit with hydrocodone and management of his pain.   Unfortunately he sustained a fall and is having increased pain of his right hip.  He is also taking gabapentin liquid elixir as a pain adjunct.   07/19/2022 Shawn [name] presents today for medication management.  He is somewhat frustrated with his constant and severe pain.  He has an allergy to tramadol.  Unfortunately buprenorphine was not approved.  Although we were hoping to avoid hydrocodone, at this point, Shawn [name] has very limited options from an oral pharmacologic standpoint.  We had discussed hydrocodone 7.5 mg twice daily as needed which will be a elixir given that he has trouble swallowing secondary to his ALS however he has been having trouble filling this at the pharmacy due to lack of inventory. I will prescribe hydrocodone 5 mg p.o. tablet 3 times a day as needed.  Consider palliative [name] referral next year.   06/28/22 No significant change in his medical history other than he went to the urgent [name] on 06/19/2022 for increased low back pain as a result of his compression fractures.  Today was prescribed a small quantity of hydrocodone. Shawn [name] was honest with me before and told me that his urine toxicology screen will be positive for THC.  He states that he has since stopped utilizing THC.  We will obtain a another urine toxicology screen at his next visit.  This should be negative for THC.   We discussed hydrocodone versus buprenorphine for chronic pain management.  I recommend a trial of buprenorphine, belbuca as this is safer, has a longer duration of analgesic effect and has less overall side effects.  We will hope to get preauthorization for this.     HPI from initial clinic visit 06/14/22: Shawn [name] is a pleasant 62 year old male who  presents with a chief complaint of low back pain.  He has a history of L1 and L2 compression fracture.  His L1 compression fracture occurred on 04/05/2022 after a fall.  He has a history of L2 compression fracture status post kyphoplasty.  He does have a history of ALS  unfortunately.  He has progressive shortness of breath, difficulty walking and severe weakness.  He recently had a G-tube placed last month.  He is here primarily for medication management/palliative [name] of chronic low back pain secondary to L1 compression fracture.  He states that he has found benefit with hydrocodone compared to oxycodone.  He was very honest with me and states that his urine toxicology screen will be positive for THC as he did smoke last month.  He states that he is no longer utilizing THC.    Pharmacotherapy Assessment  Analgesic: Hydrocodone 5 mg p.o. every 8 hours as needed  Monitoring: [place] PMP: PDMP reviewed during this encounter.       Pharmacotherapy: No side-effects or adverse reactions reported. Compliance: No problems identified. Effectiveness: Clinically acceptable.  Shawn Fischer, RN  10/12/2022  1:02 PM  Sign when Signing Visit Nursing Pain Medication Assessment:  Safety precautions to be maintained throughout the outpatient stay will include: orient to surroundings, keep bed in low position, maintain call bell within reach at all times, provide assistance with transfer out of bed and ambulation.  Medication Inspection Compliance: Pill count conducted under aseptic conditions, in front of the patient. Neither the pills nor the bottle was removed from the patient's sight at any time. Once count was completed pills were immediately returned to the patient in their original bottle.  Medication: Hydrocodone/APAP Pill/Patch Count:  54 of 90 pills remain Pill/Patch Appearance: Markings consistent with prescribed medication Bottle Appearance: Standard pharmacy container. Clearly labeled. Filled Date: 74 / 20 / 2023 Last Medication intake:  TodaySafety precautions to be maintained throughout the outpatient stay will include: orient to surroundings, keep bed in low position, maintain call bell within reach at all times, provide assistance with transfer out of bed and  ambulation.     No results found for: "CBDTHCR" No results found for: "D8THCCBX" No results found for: "D9THCCBX"  UDS:  Summary  Date Value Ref Range Status  06/14/2022 Note  Final    Comment:    ==================================================================== Compliance Drug Analysis, Ur ==================================================================== Test                             Result       Flag       Units  Drug Present and Declared for Prescription Verification   Gabapentin                     PRESENT      EXPECTED   Baclofen                       PRESENT      EXPECTED  Drug Present not Declared for Prescription Verification   Carboxy-THC                    262          UNEXPECTED ng/mg creat    Carboxy-THC is a metabolite of tetrahydrocannabinol (THC). Source of    THC is most commonly herbal marijuana or marijuana-based products,    but THC is also present in a scheduled prescription  medication.    Trace amounts of THC can be present in hemp and cannabidiol (CBD)    products. This test is not intended to distinguish between delta-9-    tetrahydrocannabinol, the predominant form of THC in most herbal or    marijuana-based products, and delta-8-tetrahydrocannabinol.  Drug Absent but Declared for Prescription Verification   Trazodone                      Not Detected UNEXPECTED   Acetaminophen                  Not Detected UNEXPECTED    Acetaminophen, as indicated in the declared medication list, is not    always detected even when used as directed.  ==================================================================== Test                      Result    Flag   Units      Ref Range   Creatinine              34               mg/dL      >=41 ==================================================================== Declared Medications:  The flagging and interpretation on this report are based on the  following declared medications.  Unexpected results may arise  from  inaccuracies in the declared medications.   **Note: The testing scope of this panel includes these medications:   Baclofen  Gabapentin (Neurontin)  Trazodone (Desyrel)   **Note: The testing scope of this panel does not include small to  moderate amounts of these reported medications:   Acetaminophen (Tylenol)   **Note: The testing scope of this panel does not include the  following reported medications:   Albuterol (Ventolin HFA)  Atorvastatin (Lipitor)  Fluticasone (Breo)  Montelukast (Singulair)  Riluzole  Vilanterol (Breo) ==================================================================== For clinical consultation, please call [phone]. ====================================================================       ROS  Constitutional:  Weakness Gastrointestinal: No reported hemesis, hematochezia, vomiting, or acute GI distress Musculoskeletal:  Diffuse arthralgias and myalgias, right hip pain Neurological:  Lower extremity paresthesias  Medication Review  Baclofen, HYDROcodone-acetaminophen, Phenylbutyrate-Taurursodiol, acetaminophen, albuterol, atorvastatin, fluticasone furoate-vilanterol, gabapentin, montelukast, riluzole, and traZODone  History Review  Allergy: Shawn [name] is allergic to aspirin, compazine [prochlorperazine], morphine and related, naproxen, penicillins, and tramadol. Drug: Shawn [name]  reports no history of drug use. Alcohol:  reports that he does not currently use alcohol. Tobacco:  reports that he has never smoked. He has never used smokeless tobacco. Social: Shawn [name]  reports that he has never smoked. He has never used smokeless tobacco. He reports that he does not currently use alcohol. He reports that he does not use drugs. Medical:  has a past medical history of ALS (amyotrophic lateral sclerosis) ([facility]), Asthma, Coronary artery disease, Hyperlipidemia, Renal stone, and Stroke ([facility]). Surgical: Shawn [name]  has a past surgical  history that includes Cystoscopy with stent placement (Left, 05/20/2015); Knee surgery (Right, 84,96, 2003); Shoulder surgery (Left, 2010); Cardiac catheterization (2006); Cystoscopy/retrograde/ureteroscopy/stone extraction with basket (2000); Cystoscopy/ureteroscopy/holmium laser (Left, 06/11/2015); Cystoscopy w/ ureteral stent removal (Left, 06/11/2015); and IR KYPHO LUMBAR INC FX REDUCE BONE BX UNI/BIL CANNULATION INC/IMAGING (01/06/2022). Family: family history includes CVA in his mother; Cancer in his mother; Diabetes in an other family member; Heart failure in his mother; Hypertension in his mother; Other in his father.  Laboratory Chemistry Profile   Renal Lab Results  Component Value Date   BUN 13 04/04/2022  CREATININE 0.50 (L) 04/04/2022   GFRAA >60 05/20/2015   GFRNONAA >60 04/04/2022    Hepatic Lab Results  Component Value Date   AST 28 04/03/2022   ALT 33 04/03/2022   ALBUMIN 4.4 04/03/2022   ALKPHOS 101 04/03/2022   LIPASE 27 05/20/2015    Electrolytes Lab Results  Component Value Date   NA 138 04/04/2022   K 3.8 04/04/2022   CL 112 (H) 04/04/2022   CALCIUM 8.5 (L) 04/04/2022    Bone No results found for: "VD25OH", "VD125OH2TOT", "[id number]", "[id number]", "25OHVITD1", "25OHVITD2", "25OHVITD3", "TESTOFREE", "TESTOSTERONE"  Inflammation (CRP: Acute Phase) (ESR: Chronic Phase) No results found for: "CRP", "ESRSEDRATE", "LATICACIDVEN"       Note: Above Lab results reviewed.  Recent Imaging Review  DG SWALLOW FUNC OP MEDICARE SPEECH PATH Table formatting from the original result was not included. CLINICAL DATA:  Patient with history of ALS, CVA with dysphagia. Presents for modified swallowing study with Speech Therapy  EXAM: MODIFIED BARIUM SWALLOW  TECHNIQUE: Different consistencies of barium were administered orally to the patient by the Speech Pathologist. Imaging of the pharynx was performed in the lateral projection. By Rushie Nyhan NP was present in the  fluoroscopy room for this study. The radiologist provided personal supervision  FLUOROSCOPY: Radiation Exposure Index (as provided by the fluoroscopic device): 16.9 mGy Kerma  COMPARISON:  None Available.  FINDINGS: Vestibular  Penetration: None seen.  Aspiration:  None seen.  Other:  Patient unable to tolerate honey thickened or pudding  IMPRESSION: No vestibular penetration or aspiration seen.  Please refer to the Speech Pathologists report for complete details and recommendations.  This exam was performed by Rushie Nyhan N/P. And was supervised by Dr. Yetta Glassman  Electronically Signed   By: Yetta Glassman M.D.   On: 07/10/2022 14:17   07/10/22 1400  SLP Visit Information  SLP Received On 07/10/22  Subjective  Subjective Pt reports coughing with intake, worse on some days vs others.  Also complains of consistent globus sensation  Patient/Family Stated Goal continue to eat for pleasure  Pain Assessment  Pain Assessment No/denies pain  General Information  Date of Onset 07/10/22  HPI Patient is a 62 y.o. 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 Past medical history also  includes CVA (however, MRI brain 05/07/20 showed "no evidence of acute or  chronic infarct", asthma, frontotemporal dementia,  Pt had MBS in 2021,  prior to ALS diagnosis with findings of mild oropharyngeal dysphagia, with  residue (L>r) after the swallow. He has been working with [facility] S[facility]; has  G-tube and currently takes some medications as well as 5 bottles via tube  per day, consuming soft solids, thin liquids for pleasure by mouth, as  well as some medications which he's unable to crush. Pt denies recent  history of pneumonia; he has not needed the Heimlich maneuver. He reports  intermittent signs of dysphagia including coughing with intake.  Type of Study MBS-Modified Barium Swallow Study  Previous Swallow Assessment see HPI   Diet Prior to this Study Dysphagia 3 (soft);Thin liquids  Temperature Spikes Noted No  Respiratory Status Room air  History of Recent Intubation No  Behavior/Cognition Alert;Cooperative  Oral Cavity Assessment WFL  Oral [name] Completed by SLP No  Oral Cavity - Dentition Other (Comment) (reports some pain due to dental abscess that was worked on last week)  Vision Functional for self feeding  Self-Feeding Abilities Able to feed self  Patient Positioning Upright  in chair  Baseline Vocal Quality Other (comment) (low pitch, strained quality)  Volitional Cough Other (Comment) (strength reduced)  Volitional Swallow Able to elicit  Anatomy [facility]  Pharyngeal Secretions Not observed secondary MBS  Oral Motor/Sensory Function  Overall Oral Motor/Sensory Function Moderate impairment  Facial ROM Reduced right;Reduced left  Facial Symmetry WFL  Lingual ROM Reduced right;Reduced left  Lingual Symmetry WFL  Lingual Strength Reduced  Oral Preparation/Oral Phase  Oral Phase Impaired  Oral - Pudding  Oral - Pudding Teaspoon Weak lingual manipulation;Other (Comment) (gags and expectorates)  Oral - Honey  Oral - Honey Teaspoon Weak lingual manipulation;Other (Comment) (gags and exectorates)  Oral - Nectar  Oral - Nectar Teaspoon Weak lingual manipulation;Lingual pumping;Reduced  posterior propulsion;Lingual/palatal residue;Piecemeal  swallowing;Decreased bolus cohesion  Oral - Nectar Cup Weak lingual manipulation;Lingual pumping;Reduced  posterior propulsion;Lingual/palatal residue;Piecemeal  swallowing;Decreased bolus cohesion  Oral - Thin  Oral - Thin Teaspoon Weak lingual manipulation;Lingual pumping;Reduced  posterior propulsion;Lingual/palatal residue;Piecemeal  swallowing;Decreased bolus cohesion  Oral - Thin Cup Weak lingual manipulation;Lingual pumping;Reduced  posterior propulsion;Lingual/palatal residue;Piecemeal  swallowing;Decreased bolus cohesion  Oral - Thin Straw Weak  lingual manipulation;Lingual pumping;Reduced  posterior propulsion;Lingual/palatal residue;Piecemeal  swallowing;Decreased bolus cohesion  Pharyngeal Phase  Pharyngeal Phase Impaired  Pharyngeal - Pudding  Pharyngeal- Pudding Teaspoon Other (Comment) (n/a- expectorated)  Pharyngeal Material does not enter airway  Pharyngeal - Honey  Pharyngeal- Honey Teaspoon Other (Comment) (n/a- expectorated)  Pharyngeal Material does not enter airway  Pharyngeal - Nectar  Pharyngeal- Nectar Teaspoon Reduced tongue base retraction;Reduced  anterior laryngeal mobility;Pharyngeal residue - pyriform  Pharyngeal Material does not enter airway  Pharyngeal- Nectar Cup Reduced tongue base retraction;Reduced anterior  laryngeal mobility;Pharyngeal residue - pyriform;Reduced laryngeal  elevation  Pharyngeal Material does not enter airway  Pharyngeal - Thin  Pharyngeal- Thin Teaspoon Reduced tongue base retraction;Reduced anterior  laryngeal mobility;Pharyngeal residue - pyriform  Pharyngeal Material does not enter airway  Pharyngeal- Thin Cup Reduced tongue base retraction;Reduced anterior  laryngeal mobility;Pharyngeal residue - pyriform  Pharyngeal Material does not enter airway  Pharyngeal- Thin Straw Reduced tongue base retraction;Reduced anterior  laryngeal mobility;Pharyngeal residue - pyriform  Pharyngeal Material does not enter airway  Pharyngeal - Solids  Pharyngeal- Regular Reduced tongue base retraction;Reduced anterior  laryngeal mobility;Pharyngeal residue - pyriform  Pharyngeal Material does not enter airway  Cervical Esophageal Phase  Cervical Esophageal Phase Impaired  Cervical Esophageal Phase - Solids  Regular Reduced cricopharyngeal relaxation  Clinical Impression  Clinical Impression Patient presents with mild-moderate oropharyngeal  dysphagia. Oral stage is characterized by weak, disorganized lingual  transport, with bolus collection in the left lateral sulcus and lingual   pumping, piecemealing noted. Able to clear oral residue with additional  swallow. Swallow initiation occurs at the level of the base of tongue.  Pharyngeal stage of the swallow is characterized by mildly reduced base of  tongue retraction and mildly reduced duration of hyolaryngeal excursion,  leading to mild-moderate residue in the pyriform sinuses (greater residue  with thicker consistencies. This reduces but does not fully clear with  subsequent swallow. Testing was limited by texture sensitivity;  honey-thick and pudding-thick textures elicited gag response while still  on anterior tongue; pt expectorated. There is no laryngeal penetration or  aspiration observed on this examination. Patient and mother were educated  on findings and recommendations using video for teachback and written  handout was provided. Educated on impact of fatigue with exertion on  swallow function, as well as signs/symptoms to monitor (overt signs of  aspiration, signs of  aspiration pneumonia). Recommend pt continue to  collaborate with [name] team (ALS clinic, [facility] SLP) to monitor for changes in  swallow function and make decisions re: comfort feeds. Based on today's  presentation it appears appropriate for pt to continue pleasure feeds of  soft solids and thin liquids.  SLP Visit Diagnosis Dysphagia, oropharyngeal phase (R13.12)  Impact on safety and function Mild aspiration risk;Moderate aspiration  risk;Risk for inadequate nutrition/hydration (fatigue increases risk)  Swallow Evaluation Recommendations  SLP Diet Recommendations Other (Comment);Dysphagia 3 (Mech soft)  solids;Thin liquid (via PEG, supplemented by pleasure feeds of soft solids, thin liquids)  Liquid Administration via Cup;Straw  Medication Administration Via alternative means (or one at a time if by mouth necessary)  Supervision Patient able to self feed  Compensations Slow rate;Small sips/bites  Postural Changes Remain semi-upright after  after feeds/meals  (Comment);Seated upright at 90 degrees  Treatment Plan  Oral [name] Recommendations Oral [name] BID  Treatment Recommendations Defer treatment plan to f/u with SLP  Follow Up Recommendations Home health SLP  Individuals Consulted  Consulted and Agree with Results and Recommendations Patient;Family  member/caregiver  Family Member Consulted mother  Report Sent to  Referring physician  Progression Toward Goals  Progression toward goals Goals met, education completed, patient  discharged from SLP  SLP Evaluations  $ SLP Speech Visit 1 Visit  SLP Evaluations  $Outpatient MBS Swallow 1 Procedure   Deneise Lever, MS, CCC-SLP Speech-Language Pathologist [phone] Note: Reviewed        Physical Exam  General appearance: Well nourished, well developed, and well hydrated. In no apparent acute distress Mental status: Alert, oriented x 3 (person, place, & time)       Respiratory: No evidence of acute respiratory distress Eyes: PERLA Vitals: BP 114/74   Pulse 69   Temp (!) 97.3 F (36.3 C)   Ht 5\' 10"  (1.778 m)   Wt 122 lb (55.3 kg)   SpO2 98%   BMI 17.51 kg/m  BMI: Estimated body mass index is 17.51 kg/m as calculated from the following:   Height as of this encounter: 5\' 10"  (1.778 m).   Weight as of this encounter: 122 lb (55.3 kg). Ideal: Ideal body weight: 73 kg (160 lb 15 oz)  Lumbar Spine Area Exam  Skin & Axial Inspection: Well healed scar from previous spine surgery detected Alignment: Symmetrical Functional ROM: Decreased ROM       Stability: No instability detected Muscle Tone/Strength: Increased muscle tone over affected area Sensory (Neurological): Musculoskeletal pain pattern   Gait & Posture Assessment  Ambulation: Patient came in today in a wheel chair Gait: Significantly limited. Dependent on assistive device to ambulate Posture: Difficulty standing up straight, due to pain   Lower Extremity Exam      Side: Right lower extremity    Side: Left lower extremity  Stability: No instability observed           Stability: No instability observed          Skin & Extremity Inspection: Skin color, temperature, and hair growth are WNL. No peripheral edema or cyanosis. No masses, redness, swelling, asymmetry, or associated skin lesions. No contractures.   Skin & Extremity Inspection: Skin color, temperature, and hair growth are WNL. No peripheral edema or cyanosis. No masses, redness, swelling, asymmetry, or associated skin lesions. No contractures.  Functional ROM: Pain restricted ROM for all joints of the lower extremity specifically right hip           Functional ROM: Pain  restricted ROM for all joints of the lower extremity          Muscle Tone/Strength: Generalized lower extremity weakness   Muscle Tone/Strength: Generalized lower extremity weakness  Sensory (Neurological): Neurogenic pain pattern         Sensory (Neurological): Neurogenic pain pattern        DTR: Patellar: deferred today Achilles: deferred today Plantar: deferred today   DTR: Patellar: deferred today Achilles: deferred today Plantar: deferred today  Palpation: No palpable anomalies   Palpation: No palpable anomalies    4- out of 5 strength bilateral lower extremity: Plantar flexion, dorsiflexion, knee flexion, knee extension.  Assessment   Diagnosis Status  1. Chronic pain syndrome   2. ALS (amyotrophic lateral sclerosis) ([place])   3. Compression fracture of L2 vertebra, initial encounter ([place])   4. Closed compression fracture of body of L1 vertebra ([facility])   5. Pain in both lower legs   6. Pain management contract signed    Persistent Worsening Persistent     Plan of [name]   Shawn [name] has a current medication list which includes the following long-term medication(s): atorvastatin, gabapentin, montelukast, and trazodone.  Pharmacotherapy (Medications Ordered): Meds ordered this encounter  Medications   HYDROcodone-acetaminophen  (NORCO/VICODIN) 5-325 MG tablet    Sig: Take 1 tablet by mouth every 8 (eight) hours as needed for severe pain. Must last 30 days.    Dispense:  90 tablet    Refill:  0    Chronic Pain: STOP Act (Not applicable) Fill 1 day early if closed on refill date. Avoid benzodiazepines within 8 hours of opioids   HYDROcodone-acetaminophen (NORCO/VICODIN) 5-325 MG tablet    Sig: Take 1 tablet by mouth every 8 (eight) hours as needed for severe pain. Must last 30 days.    Dispense:  90 tablet    Refill:  0    Chronic Pain: STOP Act (Not applicable) Fill 1 day early if closed on refill date. Avoid benzodiazepines within 8 hours of opioids  Continue with gabapentin as prescribed, unfortunately insurance would not cover hydrocodone liquid elixir. I instructed the patient that if his hip pain does not improve on the right side to contact me and we will obtain a right hip x-ray.  Patient in agreement with plan.   Orders:  No orders of the defined types were placed in this encounter.  Follow-up plan:   Return in about 9 weeks (around 12/14/2022) for Medication Management, in person.    Recent Visits Date Type Provider Dept  08/21/22 Office Visit Gillis Santa, MD [facility]  07/26/22 Office Visit Gillis Santa, MD [facility]  Showing recent visits within past 90 days and meeting all other requirements Today's Visits Date Type Provider Dept  10/12/22 Office Visit Gillis Santa, MD [facility]  Showing today's visits and meeting all other requirements Future Appointments Date Type Provider Dept  12/07/22 Appointment Gillis Santa, MD [facility]  Showing future appointments within next 90 days and meeting all other requirements  I discussed the assessment and treatment plan with the patient. The patient was provided an opportunity to ask questions and all were answered. The patient agreed with the plan and demonstrated an understanding of the  instructions.  Patient advised to call back or seek an in-person evaluation if the symptoms or condition worsens.  Duration of encounter: 30 minutes.  Total time on encounter, as per AMA guidelines included both the face-to-face and non-face-to-face time personally spent by  the physician and/or other qualified health [name] professional(s) on the day of the encounter (includes time in activities that require the physician or other qualified health [name] professional and does not include time in activities normally performed by clinical staff). Physician's time may include the following activities when performed: preparing to see the patient (eg, review of tests, pre-charting review of records) obtaining and/or reviewing separately obtained history performing a medically appropriate examination and/or evaluation counseling and educating the patient/family/caregiver ordering medications, tests, or procedures referring and communicating with other health [name] professionals (when not separately reported) documenting clinical information in the electronic or other health record independently interpreting results (not separately reported) and communicating results to the patient/ family/caregiver [name] coordination (not separately reported)  Note by: Gillis Santa, MD Date: 10/12/2022; Time: 2:38 PM

## 2022-12-07 ENCOUNTER — Encounter: Payer: Medicare Other | Admitting: Student in an Organized Health Care Education/Training Program

## 2022-12-14 ENCOUNTER — Encounter: Payer: Self-pay | Admitting: Student in an Organized Health Care Education/Training Program

## 2022-12-14 ENCOUNTER — Ambulatory Visit
Payer: Medicare Other | Attending: Student in an Organized Health Care Education/Training Program | Admitting: Student in an Organized Health Care Education/Training Program

## 2022-12-14 VITALS — BP 139/79 | HR 88 | Temp 97.9°F | Resp 16 | Ht 70.0 in | Wt 137.0 lb

## 2022-12-14 DIAGNOSIS — M79662 Pain in left lower leg: Secondary | ICD-10-CM

## 2022-12-14 DIAGNOSIS — S32020D Wedge compression fracture of second lumbar vertebra, subsequent encounter for fracture with routine healing: Secondary | ICD-10-CM | POA: Diagnosis not present

## 2022-12-14 DIAGNOSIS — S32020A Wedge compression fracture of second lumbar vertebra, initial encounter for closed fracture: Secondary | ICD-10-CM | POA: Insufficient documentation

## 2022-12-14 DIAGNOSIS — G1221 Amyotrophic lateral sclerosis: Secondary | ICD-10-CM

## 2022-12-14 DIAGNOSIS — S32010D Wedge compression fracture of first lumbar vertebra, subsequent encounter for fracture with routine healing: Secondary | ICD-10-CM

## 2022-12-14 DIAGNOSIS — Z0289 Encounter for other administrative examinations: Secondary | ICD-10-CM | POA: Diagnosis present

## 2022-12-14 DIAGNOSIS — S32010A Wedge compression fracture of first lumbar vertebra, initial encounter for closed fracture: Secondary | ICD-10-CM | POA: Insufficient documentation

## 2022-12-14 DIAGNOSIS — M79661 Pain in right lower leg: Secondary | ICD-10-CM

## 2022-12-14 DIAGNOSIS — G894 Chronic pain syndrome: Secondary | ICD-10-CM | POA: Diagnosis present

## 2022-12-14 MED ORDER — HYDROCODONE-ACETAMINOPHEN 5-325 MG PO TABS: 1.0000 | ORAL_TABLET | Freq: Three times a day (TID) | ORAL | 0 refills | Status: AC | PRN

## 2022-12-14 NOTE — Progress Notes (Signed)
Nursing Pain Medication Assessment:  Safety precautions to be maintained throughout the outpatient stay will include: orient to surroundings, keep bed in low position, maintain call bell within reach at all times, provide assistance with transfer out of bed and ambulation.  Medication Inspection Compliance: Pill count conducted under aseptic conditions, in front of the patient. Neither the pills nor the bottle was removed from the patient's sight at any time. Once count was completed pills were immediately returned to the patient in their original bottle.  Medication: Hydrocodone/APAP Pill/Patch Count:  71 of 90 pills remain Pill/Patch Appearance: Markings consistent with prescribed medication Bottle Appearance: Standard pharmacy container. Clearly labeled. Filled Date: 01 / 19 / 2024 Last Medication intake:  Today   Hydro- apap 90/90 filled on 11/20/22

## 2022-12-14 NOTE — Progress Notes (Signed)
PROVIDER NOTE: Information contained herein reflects review and annotations entered in association with encounter. Interpretation of such information and data should be left to medically-trained personnel. Information provided to patient can be located elsewhere in the medical record under "Patient Instructions". Document created using STT-dictation technology, any transcriptional errors that may result from process are unintentional.    Patient: Shawn [name]  Service Category: E/M  Provider: Gillis Santa, MD  DOB: [birth year]  DOS: 12/14/2022  Referring Provider: [facility]*  MRN: [id number]  Specialty: Interventional Pain Management  PCP: [place]  Type: Established Patient  Setting: Ambulatory outpatient    Location: Office  Delivery: Face-to-face     HPI  Mr. DEVONE BATT, a 62 y.o. year old male, is here today because of his Chronic pain syndrome [G89.4]. Mr. Mcguffey primary complain today is Back Pain (Lumbar bilateral ) Last encounter: My last encounter with him was on 10/12/22 Pertinent problems: Mr. Zender has Compression fracture of L2 ([place]); Pain in both lower legs; ALS (amyotrophic lateral sclerosis) ([place]); Closed compression fracture of body of L1 vertebra ([place]); Chronic pain syndrome; and Pain management contract signed on their pertinent problem list. Pain Assessment: Severity of Chronic pain is reported as a 8 /10. Location: Back Lower, Left, Right/back pain down both legs. Onset: More than a month ago. Quality: Discomfort, Constant, Sharp. Timing: Constant. Modifying factor(s): medications. Vitals:  height is '5\' 10"'$  (1.778 m) and weight is 137 lb (62.1 kg). His temporal temperature is 97.9 F (36.6 C). His blood pressure is 139/79 and his pulse is 88. His respiration is 16 and oxygen saturation is 94%.   Reason for encounter: medication management.  Is finding benefit with hydrocodone and management of his pain.  He recently saw  orthopedics for a right knee steroid injection.  No issues with constipation or nausea. Patient continues liquid Gabapentin Is also working with PT   07/19/2022 Karthik presents today for medication management.  He is somewhat frustrated with his constant and severe pain.  He has an allergy to tramadol.  Unfortunately buprenorphine was not approved.  Although we were hoping to avoid hydrocodone, at this point, [place] has very limited options from an oral pharmacologic standpoint.  We had discussed hydrocodone 7.5 mg twice daily as needed which will be a elixir given that he has trouble swallowing secondary to his ALS however he has been having trouble filling this at the pharmacy due to lack of inventory. I will prescribe hydrocodone 5 mg p.o. tablet 3 times a day as needed.  Consider palliative care referral next year.   06/28/22 No significant change in his medical history other than he went to the urgent care on 06/19/2022 for increased low back pain as a result of his compression fractures.  Today was prescribed a small quantity of hydrocodone. [place] was honest with me before and told me that his urine toxicology screen will be positive for THC.  He states that he has since stopped utilizing THC.  We will obtain a another urine toxicology screen at his next visit.  This should be negative for THC.   We discussed hydrocodone versus buprenorphine for chronic pain management.  I recommend a trial of buprenorphine, belbuca as this is safer, has a longer duration of analgesic effect and has less overall side effects.  We will hope to get preauthorization for this.     HPI from initial clinic visit 06/14/22: Orvall is a pleasant 62 year old male who presents with a chief  complaint of low back pain.  He has a history of L1 and L2 compression fracture.  His L1 compression fracture occurred on 04/05/2022 after a fall.  He has a history of L2 compression fracture status post kyphoplasty.  He does have a history of  ALS unfortunately.  He has progressive shortness of breath, difficulty walking and severe weakness.  He recently had a G-tube placed last month.  He is here primarily for medication management/palliative care of chronic low back pain secondary to L1 compression fracture.  He states that he has found benefit with hydrocodone compared to oxycodone.  He was very honest with me and states that his urine toxicology screen will be positive for THC as he did smoke last month.  He states that he is no longer utilizing THC.    Pharmacotherapy Assessment  Analgesic: Hydrocodone 5 mg p.o. every 8-12 hours as needed  Monitoring: [place] PMP: PDMP reviewed during this encounter.       Pharmacotherapy: No side-effects or adverse reactions reported. Compliance: No problems identified. Effectiveness: Clinically acceptable.  Janett Billow, RN  12/14/2022 12:59 PM  Sign when Signing Visit Nursing Pain Medication Assessment:  Safety precautions to be maintained throughout the outpatient stay will include: orient to surroundings, keep bed in low position, maintain call bell within reach at all times, provide assistance with transfer out of bed and ambulation.  Medication Inspection Compliance: Pill count conducted under aseptic conditions, in front of the patient. Neither the pills nor the bottle was removed from the patient's sight at any time. Once count was completed pills were immediately returned to the patient in their original bottle.  Medication: Hydrocodone/APAP Pill/Patch Count:  71 of 90 pills remain Pill/Patch Appearance: Markings consistent with prescribed medication Bottle Appearance: Standard pharmacy container. Clearly labeled. Filled Date: 01 / 19 / 2024 Last Medication intake:  Today   Hydro- apap 90/90 filled on 11/20/22    No results found for: "CBDTHCR" No results found for: "D8THCCBX" No results found for: "D9THCCBX"  UDS:  Summary  Date Value Ref Range Status  06/14/2022 Note   Final    Comment:    ==================================================================== Compliance Drug Analysis, Ur ==================================================================== Test                             Result       Flag       Units  Drug Present and Declared for Prescription Verification   Gabapentin                     PRESENT      EXPECTED   Baclofen                       PRESENT      EXPECTED  Drug Present not Declared for Prescription Verification   Carboxy-THC                    262          UNEXPECTED ng/mg creat    Carboxy-THC is a metabolite of tetrahydrocannabinol (THC). Source of    THC is most commonly herbal marijuana or marijuana-based products,    but THC is also present in a scheduled prescription medication.    Trace amounts of THC can be present in hemp and cannabidiol (CBD)    products. This test is not intended to distinguish between delta-9-    tetrahydrocannabinol,  the predominant form of THC in most herbal or    marijuana-based products, and delta-8-tetrahydrocannabinol.  Drug Absent but Declared for Prescription Verification   Trazodone                      Not Detected UNEXPECTED   Acetaminophen                  Not Detected UNEXPECTED    Acetaminophen, as indicated in the declared medication list, is not    always detected even when used as directed.  ==================================================================== Test                      Result    Flag   Units      Ref Range   Creatinine              34               mg/dL      >=20 ==================================================================== Declared Medications:  The flagging and interpretation on this report are based on the  following declared medications.  Unexpected results may arise from  inaccuracies in the declared medications.   **Note: The testing scope of this panel includes these medications:   Baclofen  Gabapentin (Neurontin)  Trazodone (Desyrel)    **Note: The testing scope of this panel does not include small to  moderate amounts of these reported medications:   Acetaminophen (Tylenol)   **Note: The testing scope of this panel does not include the  following reported medications:   Albuterol (Ventolin HFA)  Atorvastatin (Lipitor)  Fluticasone (Breo)  Montelukast (Singulair)  Riluzole  Vilanterol (Breo) ==================================================================== For clinical consultation, please call [phone]. ====================================================================       ROS  Constitutional:  Weakness Gastrointestinal: No reported hemesis, hematochezia, vomiting, or acute GI distress Musculoskeletal:  Diffuse arthralgias and myalgias, right hip pain Neurological:  Lower extremity paresthesias  Medication Review  Baclofen, HYDROcodone-acetaminophen, Phenylbutyrate-Taurursodiol, acetaminophen, albuterol, atorvastatin, fluticasone furoate-vilanterol, gabapentin, mirtazapine, montelukast, riluzole, and traZODone  History Review  Allergy: Mr. Hafey is allergic to aspirin, compazine [prochlorperazine], morphine and related, naproxen, penicillins, and tramadol. Drug: Mr. Buisson  reports no history of drug use. Alcohol:  reports that he does not currently use alcohol. Tobacco:  reports that he has never smoked. He has never used smokeless tobacco. Social: Mr. Steger  reports that he has never smoked. He has never used smokeless tobacco. He reports that he does not currently use alcohol. He reports that he does not use drugs. Medical:  has a past medical history of ALS (amyotrophic lateral sclerosis) ([place]), Asthma, Coronary artery disease, Hyperlipidemia, Renal stone, and Stroke ([place]). Surgical: Mr. Schad  has a past surgical history that includes Cystoscopy with stent placement (Left, 05/20/2015); Knee surgery (Right, 84,96, 2003); Shoulder surgery (Left, 2010); Cardiac catheterization (2006);  Cystoscopy/retrograde/ureteroscopy/stone extraction with basket (2000); Cystoscopy/ureteroscopy/holmium laser (Left, 06/11/2015); Cystoscopy w/ ureteral stent removal (Left, 06/11/2015); and IR KYPHO LUMBAR INC FX REDUCE BONE BX UNI/BIL CANNULATION INC/IMAGING (01/06/2022). Family: family history includes CVA in his mother; Cancer in his mother; Diabetes in an other family member; Heart failure in his mother; Hypertension in his mother; Other in his father.  Laboratory Chemistry Profile   Renal Lab Results  Component Value Date   BUN 13 04/04/2022   CREATININE 0.50 (L) 04/04/2022   GFRAA >60 05/20/2015   GFRNONAA >60 04/04/2022    Hepatic Lab Results  Component Value Date   AST 28 04/03/2022  ALT 33 04/03/2022   ALBUMIN 4.4 04/03/2022   ALKPHOS 101 04/03/2022   LIPASE 27 05/20/2015    Electrolytes Lab Results  Component Value Date   NA 138 04/04/2022   K 3.8 04/04/2022   CL 112 (H) 04/04/2022   CALCIUM 8.5 (L) 04/04/2022    Bone No results found for: "VD25OH", "VD125OH2TOT", "[id number]", "UK:[id number]", "25OHVITD1", "25OHVITD2", "25OHVITD3", "TESTOFREE", "TESTOSTERONE"  Inflammation (CRP: Acute Phase) (ESR: Chronic Phase) No results found for: "CRP", "ESRSEDRATE", "LATICACIDVEN"       Note: Above Lab results reviewed.  Recent Imaging Review  DG SWALLOW FUNC OP MEDICARE SPEECH PATH Table formatting from the original result was not included. CLINICAL DATA:  Patient with history of ALS, CVA with dysphagia. Presents for modified swallowing study with Speech Therapy  EXAM: MODIFIED BARIUM SWALLOW  TECHNIQUE: Different consistencies of barium were administered orally to the patient by the Speech Pathologist. Imaging of the pharynx was performed in the lateral projection. By Rushie Nyhan NP was present in the fluoroscopy room for this study. The radiologist provided personal supervision  FLUOROSCOPY: Radiation Exposure Index (as provided by the fluoroscopic device): 16.9  mGy Kerma  COMPARISON:  None Available.  FINDINGS: Vestibular  Penetration: None seen.  Aspiration:  None seen.  Other:  Patient unable to tolerate honey thickened or pudding  IMPRESSION: No vestibular penetration or aspiration seen.  Please refer to the Speech Pathologists report for complete details and recommendations.  This exam was performed by Rushie Nyhan N/P. And was supervised by Dr. Yetta Glassman  Electronically Signed   By: Yetta Glassman M.D.   On: 07/10/2022 14:17   07/10/22 1400  SLP Visit Information  SLP Received On 07/10/22  Subjective  Subjective Pt reports coughing with intake, worse on some days vs others.  Also complains of consistent globus sensation  Patient/Family Stated Goal continue to eat for pleasure  Pain Assessment  Pain Assessment No/denies pain  General Information  Date of Onset 07/10/22  HPI Patient is a 62 y.o. 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 Past medical history also  includes CVA (however, MRI brain 05/07/20 showed "no evidence of acute or  chronic infarct", asthma, frontotemporal dementia,  Pt had MBS in 2021,  prior to ALS diagnosis with findings of mild oropharyngeal dysphagia, with  residue (L>r) after the swallow. He has been working with [facility] SLP; has  G-tube and currently takes some medications as well as 5 bottles via tube  per day, consuming soft solids, thin liquids for pleasure by mouth, as  well as some medications which he's unable to crush. Pt denies recent  history of pneumonia; he has not needed the Heimlich maneuver. He reports  intermittent signs of dysphagia including coughing with intake.  Type of Study MBS-Modified Barium Swallow Study  Previous Swallow Assessment see HPI  Diet Prior to this Study Dysphagia 3 (soft);Thin liquids  Temperature Spikes Noted No  Respiratory Status Room air  History of Recent Intubation No  Behavior/Cognition  Alert;Cooperative  Oral Cavity Assessment WFL  Oral Care Completed by SLP No  Oral Cavity - Dentition Other (Comment) (reports some pain due to dental abscess that was worked on last week)  Insurance claims handler for self feeding  Self-Feeding Abilities Able to feed self  Patient Positioning Upright in chair  Baseline Vocal Quality Other (comment) (low pitch, strained quality)  Volitional Cough Other (Comment) (strength reduced)  Volitional Swallow Able to elicit  [facility]  Pharyngeal Secretions  Not observed secondary MBS  Oral Motor/Sensory Function  Overall Oral Motor/Sensory Function Moderate impairment  Facial ROM Reduced right;Reduced left  Facial Symmetry WFL  Lingual ROM Reduced right;Reduced left  Lingual Symmetry WFL  Lingual Strength Reduced  Oral Preparation/Oral Phase  Oral Phase Impaired  Oral - Pudding  Oral - Pudding Teaspoon Weak lingual manipulation;Other (Comment) (gags and expectorates)  Oral - Honey  Oral - Honey Teaspoon Weak lingual manipulation;Other (Comment) (gags and exectorates)  Oral - Nectar  Oral - Nectar Teaspoon Weak lingual manipulation;Lingual pumping;Reduced  posterior propulsion;Lingual/palatal residue;Piecemeal  swallowing;Decreased bolus cohesion  Oral - Nectar Cup Weak lingual manipulation;Lingual pumping;Reduced  posterior propulsion;Lingual/palatal residue;Piecemeal  swallowing;Decreased bolus cohesion  Oral - Thin  Oral - Thin Teaspoon Weak lingual manipulation;Lingual pumping;Reduced  posterior propulsion;Lingual/palatal residue;Piecemeal  swallowing;Decreased bolus cohesion  Oral - Thin Cup Weak lingual manipulation;Lingual pumping;Reduced  posterior propulsion;Lingual/palatal residue;Piecemeal  swallowing;Decreased bolus cohesion  Oral - Thin Straw Weak lingual manipulation;Lingual pumping;Reduced  posterior propulsion;Lingual/palatal residue;Piecemeal  swallowing;Decreased bolus cohesion  Pharyngeal Phase  Pharyngeal Phase  Impaired  Pharyngeal - Pudding  Pharyngeal- Pudding Teaspoon Other (Comment) (n/a- expectorated)  Pharyngeal Material does not enter airway  Pharyngeal - Honey  Pharyngeal- Honey Teaspoon Other (Comment) (n/a- expectorated)  Pharyngeal Material does not enter airway  Pharyngeal - Nectar  Pharyngeal- Nectar Teaspoon Reduced tongue base retraction;Reduced  anterior laryngeal mobility;Pharyngeal residue - pyriform  Pharyngeal Material does not enter airway  Pharyngeal- Nectar Cup Reduced tongue base retraction;Reduced anterior  laryngeal mobility;Pharyngeal residue - pyriform;Reduced laryngeal  elevation  Pharyngeal Material does not enter airway  Pharyngeal - Thin  Pharyngeal- Thin Teaspoon Reduced tongue base retraction;Reduced anterior  laryngeal mobility;Pharyngeal residue - pyriform  Pharyngeal Material does not enter airway  Pharyngeal- Thin Cup Reduced tongue base retraction;Reduced anterior  laryngeal mobility;Pharyngeal residue - pyriform  Pharyngeal Material does not enter airway  Pharyngeal- Thin Straw Reduced tongue base retraction;Reduced anterior  laryngeal mobility;Pharyngeal residue - pyriform  Pharyngeal Material does not enter airway  Pharyngeal - Solids  Pharyngeal- Regular Reduced tongue base retraction;Reduced anterior  laryngeal mobility;Pharyngeal residue - pyriform  Pharyngeal Material does not enter airway  Cervical Esophageal Phase  Cervical Esophageal Phase Impaired  Cervical Esophageal Phase - Solids  Regular Reduced cricopharyngeal relaxation  Clinical Impression  Clinical Impression Patient presents with mild-moderate oropharyngeal  dysphagia. Oral stage is characterized by weak, disorganized lingual  transport, with bolus collection in the left lateral sulcus and lingual  pumping, piecemealing noted. Able to clear oral residue with additional  swallow. Swallow initiation occurs at the level of the base of tongue.  Pharyngeal stage of the swallow is  characterized by mildly reduced base of  tongue retraction and mildly reduced duration of hyolaryngeal excursion,  leading to mild-moderate residue in the pyriform sinuses (greater residue  with thicker consistencies. This reduces but does not fully clear with  subsequent swallow. Testing was limited by texture sensitivity;  honey-thick and pudding-thick textures elicited gag response while still  on anterior tongue; pt expectorated. There is no laryngeal penetration or  aspiration observed on this examination. Patient and mother were educated  on findings and recommendations using video for teachback and written  handout was provided. Educated on impact of fatigue with exertion on  swallow function, as well as signs/symptoms to monitor (overt signs of  aspiration, signs of aspiration pneumonia). Recommend pt continue to  collaborate with care team (ALS clinic, [facility] SLP) to monitor for changes in  swallow function and make decisions re: comfort feeds. Based on  today's  presentation it appears appropriate for pt to continue pleasure feeds of  soft solids and thin liquids.  SLP Visit Diagnosis Dysphagia, oropharyngeal phase (R13.12)  Impact on safety and function Mild aspiration risk;Moderate aspiration  risk;Risk for inadequate nutrition/hydration (fatigue increases risk)  Swallow Evaluation Recommendations  SLP Diet Recommendations Other (Comment);Dysphagia 3 (Mech soft)  solids;Thin liquid (via PEG, supplemented by pleasure feeds of soft solids, thin liquids)  Liquid Administration via Cup;Straw  Medication Administration Via alternative means (or one at a time if by mouth necessary)  Supervision Patient able to self feed  Compensations Slow rate;Small sips/bites  Postural Changes Remain semi-upright after after feeds/meals  (Comment);Seated upright at 90 degrees  Treatment Plan  Oral Care Recommendations Oral care BID  Treatment Recommendations Defer treatment plan to f/u with SLP   Follow Up Recommendations Home health SLP  Individuals Consulted  Consulted and Agree with Results and Recommendations Patient;Family  member/caregiver  Family Member Consulted mother  Report Sent to  Referring physician  Progression Toward Goals  Progression toward goals Goals met, education completed, patient  discharged from SLP  SLP Evaluations  $ SLP Speech Visit 1 Visit  SLP Evaluations  $Outpatient MBS Swallow 1 Procedure   Deneise Lever, MS, CCC-SLP Speech-Language Pathologist [phone] Note: Reviewed        Physical Exam  General appearance: Well nourished, well developed, and well hydrated. In no apparent acute distress Mental status: Alert, oriented x 3 (person, place, & time)       Respiratory: No evidence of acute respiratory distress Eyes: PERLA Vitals: BP 139/79 (BP Location: Left Arm, Patient Position: Sitting, Cuff Size: Normal)   Pulse 88   Temp 97.9 F (36.6 C) (Temporal)   Resp 16   Ht '5\' 10"'$  (1.778 m)   Wt 137 lb (62.1 kg)   SpO2 94%   BMI 19.66 kg/m  BMI: Estimated body mass index is 19.66 kg/m as calculated from the following:   Height as of this encounter: '5\' 10"'$  (1.778 m).   Weight as of this encounter: 137 lb (62.1 kg). Ideal: Ideal body weight: 73 kg (160 lb 15 oz)  Lumbar Spine Area Exam  Skin & Axial Inspection: Well healed scar from previous spine surgery detected Alignment: Symmetrical Functional ROM: Decreased ROM       Stability: No instability detected Muscle Tone/Strength: Increased muscle tone over affected area Sensory (Neurological): Musculoskeletal pain pattern   Gait & Posture Assessment  Ambulation: Patient came in today with a walker Gait: Significantly limited. Dependent on assistive device to ambulate Posture: Difficulty standing up straight, due to pain   Lower Extremity Exam      Side: Right lower extremity   Side: Left lower extremity  Stability: No instability observed           Stability: No instability  observed          Skin & Extremity Inspection: Skin color, temperature, and hair growth are WNL. No peripheral edema or cyanosis. No masses, redness, swelling, asymmetry, or associated skin lesions. No contractures.   Skin & Extremity Inspection: Skin color, temperature, and hair growth are WNL. No peripheral edema or cyanosis. No masses, redness, swelling, asymmetry, or associated skin lesions. No contractures.  Functional ROM: Pain restricted ROM for all joints of the lower extremity specifically right hip           Functional ROM: Pain restricted ROM for all joints of the lower extremity  Muscle Tone/Strength: Generalized lower extremity weakness   Muscle Tone/Strength: Generalized lower extremity weakness  Sensory (Neurological): Neurogenic pain pattern         Sensory (Neurological): Neurogenic pain pattern        DTR: Patellar: deferred today Achilles: deferred today Plantar: deferred today   DTR: Patellar: deferred today Achilles: deferred today Plantar: deferred today  Palpation: No palpable anomalies   Palpation: No palpable anomalies    4- out of 5 strength bilateral lower extremity: Plantar flexion, dorsiflexion, knee flexion, knee extension.  Assessment   Diagnosis Status  1. Chronic pain syndrome   2. ALS (amyotrophic lateral sclerosis) ([place])   3. Compression fracture of L2 vertebra, initial encounter ([place])   4. Closed compression fracture of body of L1 vertebra ([facility])   5. Pain in both lower legs   6. Pain management contract signed     Persistent Persistent Persistent     Plan of Care   Mr. DORSE COUVERTIER has a current medication list which includes the following long-term medication(s): atorvastatin, gabapentin, mirtazapine, montelukast, trazodone, and gabapentin.  Pharmacotherapy (Medications Ordered): Meds ordered this encounter  Medications   HYDROcodone-acetaminophen (NORCO/VICODIN) 5-325 MG tablet    Sig: Take 1 tablet by mouth every 8 (eight)  hours as needed for severe pain. Must last 30 days.    Dispense:  90 tablet    Refill:  0    Chronic Pain: STOP Act (Not applicable) Fill 1 day early if closed on refill date. Avoid benzodiazepines within 8 hours of opioids  Continue with gabapentin as prescribed, unfortunately insurance would not cover hydrocodone liquid elixir.    Orders:  No orders of the defined types were placed in this encounter.  Follow-up plan:   Return for patient will call to schedule F2F appt prn.    Recent Visits Date Type Provider Dept  10/12/22 Office Visit Gillis Santa, MD [facility]  Showing recent visits within past 90 days and meeting all other requirements Today's Visits Date Type Provider Dept  12/14/22 Office Visit Gillis Santa, MD [facility]  Showing today's visits and meeting all other requirements Future Appointments No visits were found meeting these conditions. Showing future appointments within next 90 days and meeting all other requirements  I discussed the assessment and treatment plan with the patient. The patient was provided an opportunity to ask questions and all were answered. The patient agreed with the plan and demonstrated an understanding of the instructions.  Patient advised to call back or seek an in-person evaluation if the symptoms or condition worsens.  Duration of encounter: 30 minutes.  Total time on encounter, as per AMA guidelines included both the face-to-face and non-face-to-face time personally spent by the physician and/or other qualified health care professional(s) on the day of the encounter (includes time in activities that require the physician or other qualified health care professional and does not include time in activities normally performed by clinical staff). Physician's time may include the following activities when performed: preparing to see the patient (eg, review of tests, pre-charting review of records) obtaining and/or  reviewing separately obtained history performing a medically appropriate examination and/or evaluation counseling and educating the patient/family/caregiver ordering medications, tests, or procedures referring and communicating with other health care professionals (when not separately reported) documenting clinical information in the electronic or other health record independently interpreting results (not separately reported) and communicating results to the patient/ family/caregiver care coordination (not separately reported)  Note by: Gillis Santa, MD Date: 12/14/2022;  Time: 1:39 PM

## 2023-01-12 ENCOUNTER — Encounter: Payer: Self-pay | Admitting: Emergency Medicine

## 2023-01-12 ENCOUNTER — Ambulatory Visit
Admission: EM | Admit: 2023-01-12 | Discharge: 2023-01-12 | Disposition: A | Discharge status: Home / Self Care | Payer: Medicare Other | Attending: Emergency Medicine | Admitting: Emergency Medicine

## 2023-01-12 ENCOUNTER — Ambulatory Visit (INDEPENDENT_AMBULATORY_CARE_PROVIDER_SITE_OTHER): Payer: Medicare Other

## 2023-01-12 DIAGNOSIS — W19XXXA Unspecified fall, initial encounter: Secondary | ICD-10-CM | POA: Diagnosis not present

## 2023-01-12 DIAGNOSIS — M25551 Pain in right hip: Secondary | ICD-10-CM

## 2023-01-12 DIAGNOSIS — M25511 Pain in right shoulder: Secondary | ICD-10-CM | POA: Diagnosis not present

## 2023-01-12 MED ORDER — DEXAMETHASONE SODIUM PHOSPHATE 10 MG/ML IJ SOLN
10.0000 mg | Freq: Once | INTRAMUSCULAR | Status: AC
  Administered 2023-01-12: 10 mg via INTRAMUSCULAR

## 2023-01-12 MED ORDER — PREDNISOLONE 15 MG/5ML PO SOLN: 40.0000 mg | Freq: Every day | ORAL | 0 refills | Status: AC

## 2023-01-12 NOTE — ED Provider Notes (Signed)
[facility]    CSN: [id number] Arrival date & time: 01/12/23  0945      History   Chief Complaint Chief Complaint  Patient presents with   Fall   Shoulder Pain    right   Hip Pain    right    HPI Shawn [name] is a 62 y.o. male.   Patient presents for evaluation of right shoulder pain and right hip pain beginning 1 day ago after fall.  Was walking to get some medical equipment in his home when legs gave out, landed on the right side with arm extended as he attempted to catch himself.  Now having pain generalized to the right shoulder as well as to the side aspect of the right hip.  Limited range of motion to the right arm due to pain elicited, pain elicited when bearing weight to the right lower extremity.  Uses walker at baseline.  Has not attempted treatment of symptoms.  Denies numbness or tingling.  History of ALS and chronic back pain, taking Vicodin, followed by the pain clinic.  Past Medical History:  Diagnosis Date   ALS (amyotrophic lateral sclerosis)    Asthma    Coronary artery disease    Hyperlipidemia    Renal stone    Stroke     Patient Active Problem List   Diagnosis Date Noted   Chronic pain syndrome 06/28/2022   Pain management contract signed 06/28/2022   Closed compression fracture of body of L1 vertebra 04/03/2022   Dyslipidemia 04/03/2022   Right leg pain 11/13/2021   COVID-19 virus infection 11/13/2021   Acute hypoxemic respiratory failure due to COVID-19 11/13/2021   Acute respiratory failure with hypoxia 11/12/2021   Compression fracture of L2 11/07/2021   Lumbago 11/07/2021   Pain in both lower legs 11/07/2021   ALS (amyotrophic lateral sclerosis) 11/07/2021   Asthma, chronic 11/07/2021   Ureteral stone 05/20/2015    Past Surgical History:  Procedure Laterality Date   CARDIAC CATHETERIZATION  2006   negative   CYSTOSCOPY W/ URETERAL STENT REMOVAL Left 06/11/2015   Procedure: CYSTOSCOPY WITH STENT EXCHANGE;  Surgeon:  Malen Gauze, MD;  Location: [facility];  Service: Urology;  Laterality: Left;   CYSTOSCOPY WITH STENT PLACEMENT Left 05/20/2015   Procedure: CYSTOSCOPY WITH STENT PLACEMENT;  Surgeon: Crist Fat, MD;  Location: [facility];  Service: Urology;  Laterality: Left;   CYSTOSCOPY/RETROGRADE/URETEROSCOPY/STONE EXTRACTION WITH BASKET  2000   pt. unsure of which side done   CYSTOSCOPY/URETEROSCOPY/HOLMIUM LASER Left 06/11/2015   Procedure: CYSTOSCOPY/URETEROSCOPY/HOLMIUM LASER;  Surgeon: Malen Gauze, MD;  Location: [facility];  Service: Urology;  Laterality: Left;   IR KYPHO LUMBAR INC FX REDUCE BONE BX UNI/BIL CANNULATION INC/IMAGING  01/06/2022   KNEE SURGERY Right 84,96, 2003   x3   SHOULDER SURGERY Left 2010       Home Medications    Prior to Admission medications   Medication Sig Start Date End Date Taking? Authorizing Provider  acetaminophen (TYLENOL) 160 MG/5ML solution Take 480 mg by mouth every 6 (six) hours as needed for mild pain or moderate pain.    [provider]  albuterol (VENTOLIN HFA) 108 (90 Base) MCG/ACT inhaler Inhale 1-2 puffs into the lungs every 6 (six) hours as needed for wheezing or shortness of breath.    [provider]  atorvastatin (LIPITOR) 20 MG tablet Take 20 mg by mouth daily. 06/25/20   [provider]  Baclofen 5 MG TABS Take 10 mg  by mouth 3 (three) times daily.    [provider]  fluticasone furoate-vilanterol (BREO ELLIPTA) 100-25 MCG/ACT AEPB Inhale 1 puff into the lungs in the morning.    [provider]  gabapentin (NEURONTIN) 250 MG/5ML solution Place 12 mLs into feeding tube 3 (three) times daily. 08/22/22   [provider]  gabapentin (NEURONTIN) 300 MG capsule Take 300 mg by mouth 3 (three) times daily. Patient not taking: Reported on 12/14/2022 02/07/22   [provider]  HYDROcodone-acetaminophen (NORCO/VICODIN) 5-325 MG tablet Take 1 tablet by mouth every 8 (eight) hours as needed  for severe pain. Must last 30 days. 01/15/23 02/14/23  Edward Jolly, MD  mirtazapine (REMERON) 7.5 MG tablet Take 7.5 mg by mouth at bedtime. 11/28/22   [provider]  montelukast (SINGULAIR) 10 MG tablet Take 10 mg by mouth at bedtime.    [provider]  Phenylbutyrate-Taurursodiol (RELYVRIO) 3-1 g PACK MIX AND TAKE 1 PACKET DAILY FOR 3 WEEKS, THEN 1 PACKET IN THE MORNING AND 1 PACKET AT NIGHT THEREAFTER. Patient not taking: Reported on 12/14/2022 06/16/22   [provider]  riluzole (RILUTEK) 50 MG tablet Take 50 mg by mouth 2 (two) times daily.    [provider]  traZODone (DESYREL) 100 MG tablet Take 100 mg by mouth at bedtime.    [provider]    Family History Family History  Problem Relation Age of Onset   Cancer Mother    Heart failure Mother    Hypertension Mother    CVA Mother    Diabetes Other    Other Father        unknown medical history    Social History Social History   Tobacco Use   Smoking status: Never   Smokeless tobacco: Never  Vaping Use   Vaping Use: Never used  Substance Use Topics   Alcohol use: Not Currently    Alcohol/week: 0.0 standard drinks of alcohol    Comment: occasionally   Drug use: Never     Allergies   Aspirin, Compazine [prochlorperazine], Morphine and related, Naproxen, Penicillins, and Tramadol   Review of Systems Review of Systems   Physical Exam Triage Vital Signs ED Triage Vitals  Enc Vitals Group     BP 01/12/23 1028 113/82     Pulse Rate 01/12/23 1028 89     Resp 01/12/23 1028 15     Temp 01/12/23 1028 98.4 F (36.9 C)     Temp Source 01/12/23 1028 Oral     SpO2 01/12/23 1028 96 %     Weight 01/12/23 1026 136 lb 14.5 oz (62.1 kg)     Height 01/12/23 1026  (1.778 m)     Head Circumference --      Peak Flow --      Pain Score 01/12/23 1026 7     Pain Loc --      Pain Edu? --      Excl. in GC? --    No data found.  Updated Vital Signs BP 113/82 (BP  Location: Left Arm)   Pulse 89   Temp 98.4 F (36.9 C) (Oral)   Resp 15   Ht  (1.778 m)   Wt 136 lb 14.5 oz (62.1 kg)   SpO2 96%   BMI 19.64 kg/m   Visual Acuity Right Eye Distance:   Left Eye Distance:   Bilateral Distance:    Right Eye Near:   Left Eye Near:    Bilateral Near:  Physical Exam Musculoskeletal:     Comments: Generalized tenderness is present to the right shoulder without point tenderness, ecchymosis, swelling or deformity, range of motion is limited due to pain elicited, 2+ brachial pulse  Tenderness is present to the lateral aspect of the right hip without ecchymosis, swelling or deformity, able to bear weight at baseline      UC Treatments / Results  Labs (all labs ordered are listed, but only abnormal results are displayed) Labs Reviewed - No data to display  EKG   Radiology No results found.  Procedures Procedures (including critical care time)  Medications Ordered in UC Medications - No data to display  Initial Impression / Assessment and Plan / UC Course  I have reviewed the triage vital signs and the nursing notes.  Pertinent labs & imaging results that were available during my care of the patient were reviewed by me and considered in my medical decision making (see chart for details).  Acute right shoulder pain, right hip pain  Etiology is most likely muscular, x-rays are negative, discussed with patient and parent, Decadron injection given in office and prescribed prednisolone for outpatient use, recommended RICE, heat massage stretching and activity as tolerated, for persisting symptoms recommended follow-up with his orthopedic doctor Final Clinical Impressions(s) / UC Diagnoses   Final diagnoses:  None   Discharge Instructions   None    ED Prescriptions   None    PDMP not reviewed this encounter.   Valinda Hoar, NP 01/12/23 1139

## 2023-01-12 NOTE — Discharge Instructions (Addendum)
X-ray of the right hip and right shoulder are normal, there is no signs of injury to the bone  You are currently under contract with the [facility] pain clinic and therefore you cannot be prescribed any narcotics at today's visit for your discomfort  You have been given an injection of decadron here today in office which helps to reduce inflammation and ideally will help calm your pain down in about 30 minutes  beginning tomorrow start prednisolone every morning  to continue the process  You may take a muscle relaxant at bedtime as needed for additional comfort, be mindful this may make you feel sleepy  You may use ice or heat over the affected area in 10 to 15-minute intervals  You may massage and stretch the area as tolerated  If your symptoms continue to persist you may follow-up with your orthopedic doctor for further evaluation

## 2023-01-12 NOTE — ED Triage Notes (Signed)
Patient states that he fell at his home yesterday.  Patient is here with his mother.  Patient c/o right shoulder and right hip pain. Patient denies hitting his head.

## 2023-02-06 ENCOUNTER — Ambulatory Visit
Payer: Medicare Other | Attending: Student in an Organized Health Care Education/Training Program | Admitting: Student in an Organized Health Care Education/Training Program

## 2023-02-06 ENCOUNTER — Encounter: Payer: Self-pay | Admitting: Student in an Organized Health Care Education/Training Program

## 2023-02-06 VITALS — BP 107/74 | HR 79 | Temp 97.4°F | Resp 16 | Ht 70.0 in | Wt 137.0 lb

## 2023-02-06 DIAGNOSIS — G8929 Other chronic pain: Secondary | ICD-10-CM | POA: Diagnosis present

## 2023-02-06 DIAGNOSIS — M4802 Spinal stenosis, cervical region: Secondary | ICD-10-CM | POA: Insufficient documentation

## 2023-02-06 DIAGNOSIS — M25511 Pain in right shoulder: Secondary | ICD-10-CM | POA: Insufficient documentation

## 2023-02-06 DIAGNOSIS — M5412 Radiculopathy, cervical region: Secondary | ICD-10-CM | POA: Diagnosis present

## 2023-02-06 DIAGNOSIS — G894 Chronic pain syndrome: Secondary | ICD-10-CM | POA: Insufficient documentation

## 2023-02-06 NOTE — Progress Notes (Signed)
Nursing Pain Medication Assessment:  Safety precautions to be maintained throughout the outpatient stay will include: orient to surroundings, keep bed in low position, maintain call bell within reach at all times, provide assistance with transfer out of bed and ambulation.  Medication Inspection Compliance: Pill count conducted under aseptic conditions, in front of the patient. Neither the pills nor the bottle was removed from the patient's sight at any time. Once count was completed pills were immediately returned to the patient in their original bottle.  Medication #1: Hydrocodone/APAP Pill/Patch Count:  43 of 90 pills remain Pill/Patch Appearance: Markings consistent with prescribed medication Bottle Appearance: Standard pharmacy container. Clearly labeled. Filled Date: 02 / 19 / 2024 Last Medication intake:  Yesterday  Medication #2: Hydrocodone/APAP Pill/Patch Count:  90 of 90 pills remain Pill/Patch Appearance: Markings consistent with prescribed medication Bottle Appearance: Standard pharmacy container. Clearly labeled. Filled Date: 04 / 15 / 2024 Last Medication intake:   new Rx has not started on this bottle.

## 2023-02-06 NOTE — Progress Notes (Signed)
PROVIDER NOTE: Information contained herein reflects review and annotations entered in association with encounter. Interpretation of such information and data should be left to medically-trained personnel. Information provided to patient can be located elsewhere in the medical record under "Patient Instructions". Document created using STT-dictation technology, any transcriptional errors that may result from process are unintentional.    Patient: Shawn [name]  Service Category: E/M  Provider: Edward Jolly, MD  DOB: 02-Apr-1961  DOS: 02/06/2023  Referring Provider: [name]*  MRN: [id number]  Specialty: Interventional Pain Management  PCP: Shawn [name]  Type: Established Patient  Setting: Ambulatory outpatient    Location: Office  Delivery: Face-to-face     HPI  Shawn [name], a 62 y.o. year old male, is here today because of his Cervical radicular pain [M54.12]. [name] primary complain today is Back Pain (Lumbar bilateral ), Shoulder Pain (Right ), and Hip Pain (Right ) Last encounter: My last encounter with him was on 10/12/22 Pertinent problems: Shawn [name] has Compression fracture of L2 (HCC); Pain in both lower legs; ALS (amyotrophic lateral sclerosis) (HCC); Closed compression fracture of body of L1 vertebra (HCC); Chronic pain syndrome; and Pain management contract signed on their pertinent problem list. Pain Assessment: Severity of Chronic pain is reported as a 8 /10. Location: Back Lower, Left, Right/back pain down both legs.. Onset: More than a month ago. Quality: Constant, Discomfort, Sharp. Timing: Constant. Modifying factor(s): medications. Vitals:  height is 5\' 10"  (1.778 m) and weight is 137 lb (62.1 kg). His temporal temperature is 97.4 F (36.3 C) (abnormal). His blood pressure is 107/74 and his pulse is 79. His respiration is 16 and oxygen saturation is 94%.   Reason for encounter: medication management.  Is finding benefit with  hydrocodone and management of his pain. Has a surplus of his medications, does not need a refill at this time. Taking BID-TID prn Endorsing right cervical spine pain, occipital headaches, and radiation to his right arm and elbow Also experiencing right shoulder pain   07/19/2022 Shawn [name] presents today for medication management.  He is somewhat frustrated with his constant and severe pain.  He has an allergy to tramadol.  Unfortunately buprenorphine was not approved.  Although we were hoping to avoid hydrocodone, at this point, Shawn [name] has very limited options from an oral pharmacologic standpoint.  We had discussed hydrocodone 7.5 mg twice daily as needed which will be a elixir given that he has trouble swallowing secondary to his ALS however he has been having trouble filling this at the pharmacy due to lack of inventory. I will prescribe hydrocodone 5 mg p.o. tablet 3 times a day as needed.  Consider palliative care referral next year.   06/28/22 No significant change in his medical history other than he went to the urgent care on 06/19/2022 for increased low back pain as a result of his compression fractures.  Today was prescribed a small quantity of hydrocodone. Shawn [name] was honest with me before and told me that his urine toxicology screen will be positive for THC.  He states that he has since stopped utilizing THC.  We will obtain a another urine toxicology screen at his next visit.  This should be negative for THC.   We discussed hydrocodone versus buprenorphine for chronic pain management.  I recommend a trial of buprenorphine, belbuca as this is safer, has a longer duration of analgesic effect and has less overall side effects.  We will hope to get preauthorization for this.  HPI from initial clinic visit 06/14/22: Shawn [name] is a pleasant 62 year old male who presents with a chief complaint of low back pain.  He has a history of L1 and L2 compression fracture.  His L1 compression fracture occurred on  04/05/2022 after a fall.  He has a history of L2 compression fracture status post kyphoplasty.  He does have a history of ALS unfortunately.  He has progressive shortness of breath, difficulty walking and severe weakness.  He recently had a G-tube placed last month.  He is here primarily for medication management/palliative care of chronic low back pain secondary to L1 compression fracture.  He states that he has found benefit with hydrocodone compared to oxycodone.  He was very honest with me and states that his urine toxicology screen will be positive for THC as he did smoke last month.  He states that he is no longer utilizing THC.    Pharmacotherapy Assessment  Analgesic: Hydrocodone 5 mg p.o. every 8-12 hours as needed  Monitoring: [place] PMP: PDMP not reviewed this encounter.       Pharmacotherapy: No side-effects or adverse reactions reported. Compliance: No problems identified. Effectiveness: Clinically acceptable.  Shawn Ammons, RN  02/06/2023  9:03 AM  Sign when Signing Visit Nursing Pain Medication Assessment:  Safety precautions to be maintained throughout the outpatient stay will include: orient to surroundings, keep bed in low position, maintain call bell within reach at all times, provide assistance with transfer out of bed and ambulation.  Medication Inspection Compliance: Pill count conducted under aseptic conditions, in front of the patient. Neither the pills nor the bottle was removed from the patient's sight at any time. Once count was completed pills were immediately returned to the patient in their original bottle.  Medication #1: Hydrocodone/APAP Pill/Patch Count:  43 of 90 pills remain Pill/Patch Appearance: Markings consistent with prescribed medication Bottle Appearance: Standard pharmacy container. Clearly labeled. Filled Date: 02 / 19 / 2024 Last Medication intake:  Yesterday  Medication #2: Hydrocodone/APAP Pill/Patch Count:  90 of 90 pills remain Pill/Patch  Appearance: Markings consistent with prescribed medication Bottle Appearance: Standard pharmacy container. Clearly labeled. Filled Date: 04 / 15 / 2024 Last Medication intake:   new Rx has not started on this bottle.   No results found for: "CBDTHCR" No results found for: "D8THCCBX" No results found for: "D9THCCBX"  UDS:  Summary  Date Value Ref Range Status  06/14/2022 Note  Final    Comment:    ==================================================================== Compliance Drug Analysis, Ur ==================================================================== Test                             Result       Flag       Units  Drug Present and Declared for Prescription Verification   Gabapentin                     PRESENT      EXPECTED   Baclofen                       PRESENT      EXPECTED  Drug Present not Declared for Prescription Verification   Carboxy-THC                    262          UNEXPECTED ng/mg creat    Carboxy-THC is a metabolite of tetrahydrocannabinol (THC). Source of  THC is most commonly herbal marijuana or marijuana-based products,    but THC is also present in a scheduled prescription medication.    Trace amounts of THC can be present in hemp and cannabidiol (CBD)    products. This test is not intended to distinguish between delta-9-    tetrahydrocannabinol, the predominant form of THC in most herbal or    marijuana-based products, and delta-8-tetrahydrocannabinol.  Drug Absent but Declared for Prescription Verification   Trazodone                      Not Detected UNEXPECTED   Acetaminophen                  Not Detected UNEXPECTED    Acetaminophen, as indicated in the declared medication list, is not    always detected even when used as directed.  ==================================================================== Test                      Result    Flag   Units      Ref Range   Creatinine              34               mg/dL       >=16 ==================================================================== Declared Medications:  The flagging and interpretation on this report are based on the  following declared medications.  Unexpected results may arise from  inaccuracies in the declared medications.   **Note: The testing scope of this panel includes these medications:   Baclofen  Gabapentin (Neurontin)  Trazodone (Desyrel)   **Note: The testing scope of this panel does not include small to  moderate amounts of these reported medications:   Acetaminophen (Tylenol)   **Note: The testing scope of this panel does not include the  following reported medications:   Albuterol (Ventolin HFA)  Atorvastatin (Lipitor)  Fluticasone (Breo)  Montelukast (Singulair)  Riluzole  Vilanterol (Breo) ==================================================================== For clinical consultation, please call [phone]. ====================================================================       ROS  Constitutional:  Weakness Gastrointestinal: No reported hemesis, hematochezia, vomiting, or acute GI distress Musculoskeletal:  right cervical spine pain with radiation to right shoulder Neurological:  Lower extremity paresthesias  Medication Review  Baclofen, HYDROcodone-acetaminophen, acetaminophen, albuterol, atorvastatin, fluticasone furoate-vilanterol, gabapentin, mirtazapine, montelukast, and riluzole  History Review  Allergy: Shawn [name] is allergic to aspirin, compazine [prochlorperazine], morphine and related, naproxen, penicillins, and tramadol. Drug: Shawn [name]  reports no history of drug use. Alcohol:  reports that he does not currently use alcohol. Tobacco:  reports that he has never smoked. He has never used smokeless tobacco. Social: Shawn [name]  reports that he has never smoked. He has never used smokeless tobacco. He reports that he does not currently use alcohol. He reports that he does not use  drugs. Medical:  has a past medical history of ALS (amyotrophic lateral sclerosis) (HCC), Asthma, Coronary artery disease, Hyperlipidemia, Renal stone, and Stroke (HCC). Surgical: Shawn [name]  has a past surgical history that includes Cystoscopy with stent placement (Left, 05/20/2015); Knee surgery (Right, 84,96, 2003); Shoulder surgery (Left, 2010); Cardiac catheterization (2006); Cystoscopy/retrograde/ureteroscopy/stone extraction with basket (2000); Cystoscopy/ureteroscopy/holmium laser (Left, 06/11/2015); Cystoscopy w/ ureteral stent removal (Left, 06/11/2015); and IR KYPHO LUMBAR [name] FX REDUCE BONE BX UNI/BIL CANNULATION [name]/IMAGING (01/06/2022). Family: family history includes CVA in his mother; Cancer in his mother; Diabetes in an other family member; Heart failure in his mother; Hypertension in his mother;  Other in his father.  Laboratory Chemistry Profile   Renal Lab Results  Component Value Date   BUN 13 04/04/2022   CREATININE 0.50 (L) 04/04/2022   GFRAA >60 05/20/2015   GFRNONAA >60 04/04/2022    Hepatic Lab Results  Component Value Date   AST 28 04/03/2022   ALT 33 04/03/2022   ALBUMIN 4.4 04/03/2022   ALKPHOS 101 04/03/2022   LIPASE 27 05/20/2015    Electrolytes Lab Results  Component Value Date   NA 138 04/04/2022   K 3.8 04/04/2022   CL 112 (H) 04/04/2022   CALCIUM 8.5 (L) 04/04/2022    Bone No results found for: "VD25OH", "VD125OH2TOT", "UU7253GU4", "QI3474QV9", "25OHVITD1", "25OHVITD2", "25OHVITD3", "TESTOFREE", "TESTOSTERONE"  Inflammation (CRP: Acute Phase) (ESR: Chronic Phase) No results found for: "CRP", "ESRSEDRATE", "LATICACIDVEN"       Note: Above Lab results reviewed.  Recent Imaging Review  DG Hip Unilat With Pelvis 2-3 Views Right CLINICAL DATA:  Fall at home yesterday, RIGHT hip pain.  EXAM: DG HIP (WITH OR WITHOUT PELVIS) 2-3V RIGHT  COMPARISON:  None Available.  FINDINGS: Single-view of the pelvis and two views of the RIGHT hip are provided.  Osseous alignment is normal. No fracture line or displaced fracture fragment is seen. No significant degenerative change at either hip joint. No significant degenerative changes seen within the lower lumbar spine. Soft tissues about the pelvis and RIGHT hip are unremarkable.  IMPRESSION: No acute findings. No osseous fracture or dislocation seen.  Electronically Signed   By: Bary Richard M.D.   On: 01/12/2023 11:06 DG Shoulder Right CLINICAL DATA:  Fall, RIGHT shoulder pain.  EXAM: RIGHT SHOULDER - 2+ VIEW  COMPARISON:  None Available.  FINDINGS: There is no evidence of fracture or dislocation. There is no evidence of arthropathy or other focal bone abnormality. Soft tissues are unremarkable.  IMPRESSION: Negative.  Electronically Signed   By: Bary Richard M.D.   On: 01/12/2023 11:05  CT CERVICAL SPINE FINDINGS   Alignment: Normal.   Skull base and vertebrae: C5-C6 intervertebral disc space narrowing, osteophyte formation, posterior disc osteophyte complex formation. No acute fracture. No aggressive appearing focal osseous lesion or focal pathologic process.   Soft tissues and spinal canal: No prevertebral fluid or swelling. No visible canal hematoma.   Upper chest: Unremarkable.    Note: Reviewed        Physical Exam  General appearance: Well nourished, well developed, and well hydrated. In no apparent acute distress Mental status: Alert, oriented x 3 (person, place, & time)       Respiratory: No evidence of acute respiratory distress Eyes: PERLA Vitals: BP 107/74 (BP Location: Left Arm, Patient Position: Sitting, Cuff Size: Normal)   Pulse 79   Temp (!) 97.4 F (36.3 C) (Temporal)   Resp 16   Ht 5\' 10"  (1.778 m)   Wt 137 lb (62.1 kg)   SpO2 94%   BMI 19.66 kg/m  BMI: Estimated body mass index is 19.66 kg/m as calculated from the following:   Height as of this encounter: 5\' 10"  (1.778 m).   Weight as of this encounter: 137 lb (62.1 kg). Ideal:  Ideal body weight: 73 kg (160 lb 15 oz)  Right cervical spine pain with parasthesias of right hand Right shoulder pain with shoulder abduction  Lumbar Spine Area Exam  Skin & Axial Inspection: Well healed scar from previous spine surgery detected Alignment: Symmetrical Functional ROM: Decreased ROM       Stability: No instability detected Muscle  Tone/Strength: Increased muscle tone over affected area Sensory (Neurological): Musculoskeletal pain pattern   Gait & Posture Assessment  Ambulation: Patient came in today with a walker Gait: Significantly limited. Dependent on assistive device to ambulate Posture: Difficulty standing up straight, due to pain   Lower Extremity Exam      Side: Right lower extremity   Side: Left lower extremity  Stability: No instability observed           Stability: No instability observed          Skin & Extremity Inspection: Skin color, temperature, and hair growth are WNL. No peripheral edema or cyanosis. No masses, redness, swelling, asymmetry, or associated skin lesions. No contractures.   Skin & Extremity Inspection: Skin color, temperature, and hair growth are WNL. No peripheral edema or cyanosis. No masses, redness, swelling, asymmetry, or associated skin lesions. No contractures.  Functional ROM: Pain restricted ROM for all joints of the lower extremity specifically right hip           Functional ROM: Pain restricted ROM for all joints of the lower extremity          Muscle Tone/Strength: Generalized lower extremity weakness   Muscle Tone/Strength: Generalized lower extremity weakness  Sensory (Neurological): Neurogenic pain pattern         Sensory (Neurological): Neurogenic pain pattern        DTR: Patellar: deferred today Achilles: deferred today Plantar: deferred today   DTR: Patellar: deferred today Achilles: deferred today Plantar: deferred today  Palpation: No palpable anomalies   Palpation: No palpable anomalies    4- out of 5 strength  bilateral lower extremity: Plantar flexion, dorsiflexion, knee flexion, knee extension.  Assessment   Diagnosis Status  1. Cervical radicular pain   2. Foraminal stenosis of cervical region   3. Chronic right shoulder pain   4. Chronic pain syndrome     Persistent Persistent Persistent     Plan of Care   Shawn [name] has a current medication list which includes the following long-term medication(s): atorvastatin, gabapentin, mirtazapine, and montelukast.  1. Cervical radicular pain - Cervical Epidural Injection; Future  2. Foraminal stenosis of cervical region - Cervical Epidural Injection; Future  3. Chronic right shoulder pain - SUPRASCAPULAR NERVE BLOCK; Future  4. Chronic pain syndrome -continue with MM- including Gabapentin and Hydrocodone PRN, no refill needed for Shawn [name]     Orders:  Orders Placed This Encounter  Procedures   Cervical Epidural Injection    Sedation: without Purpose: Diagnostic/Therapeutic Indication(s): Radiculitis and cervicalgia associater with cervical degenerative disc disease.    Standing Status:   Future    Standing Expiration Date:   05/09/2023    Scheduling Instructions:     Procedure: Cervical Epidural Steroid Injection/Block     Level(s): C7-T1     Laterality: RIGHT     Timeframe: As soon as schedule allows    Order Specific Question:   Where will this procedure be performed?    Answer:   [facility]    Comments:   Czarina Gingras   SUPRASCAPULAR NERVE BLOCK    For shoulder pain.    Standing Status:   Future    Standing Expiration Date:   05/09/2023    Scheduling Instructions:     Purpose: Diagnostic     Laterality: RIGHT     Level(s): Suprascapular notch     Sedation: without     Scheduling Timeframe: As permitted by the schedule    Order Specific Question:  Where will this procedure be performed?    Answer:   [facility]   Follow-up plan:   Return in about 22 days (around 02/28/2023) for Right C-ESI + R  SSNB, in clinic [facility].    Recent Visits Date Type Provider Dept  12/14/22 Office Visit Shawn Jolly, MD [facility]  Showing recent visits within past 90 days and meeting all other requirements Today's Visits Date Type Provider Dept  02/06/23 Office Visit Shawn Jolly, MD [facility]  Showing today's visits and meeting all other requirements Future Appointments No visits were found meeting these conditions. Showing future appointments within next 90 days and meeting all other requirements  I discussed the assessment and treatment plan with the patient. The patient was provided an opportunity to ask questions and all were answered. The patient agreed with the plan and demonstrated an understanding of the instructions.  Patient advised to call back or seek an in-person evaluation if the symptoms or condition worsens.  Duration of encounter: 30 minutes.  Total time on encounter, as per AMA guidelines included both the face-to-face and non-face-to-face time personally spent by the physician and/or other qualified health care professional(s) on the day of the encounter (includes time in activities that require the physician or other qualified health care professional and does not include time in activities normally performed by clinical staff). Physician's time may include the following activities when performed: preparing to see the patient (eg, review of tests, pre-charting review of records) obtaining and/or reviewing separately obtained history performing a medically appropriate examination and/or evaluation counseling and educating the patient/family/caregiver ordering medications, tests, or procedures referring and communicating with other health care professionals (when not separately reported) documenting clinical information in the electronic or other health record independently interpreting results (not separately reported) and communicating results to the patient/  family/caregiver care coordination (not separately reported)  Note by: Shawn Jolly, MD Date: 02/06/2023; Time: 9:23 AM

## 2023-02-06 NOTE — Patient Instructions (Signed)
____________________________________________________________________________________________  General Risks and Possible Complications  Patient Responsibilities: It is important that you read this as it is part of your informed consent. It is our duty to inform you of the risks and possible complications associated with treatments offered to you. It is your responsibility as a patient to read this and to ask questions about anything that is not clear or that you believe was not covered in this document.  Patient's Rights: You have the right to refuse treatment. You also have the right to change your mind, even after initially having agreed to have the treatment done. However, under this last option, if you wait until the last second to change your mind, you may be charged for the materials used up to that point.  Introduction: Medicine is not an exact science. Everything in Medicine, including the lack of treatment(s), carries the potential for danger, harm, or loss (which is by definition: Risk). In Medicine, a complication is a secondary problem, condition, or disease that can aggravate an already existing one. All treatments carry the risk of possible complications. The fact that a side effects or complications occurs, does not imply that the treatment was conducted incorrectly. It must be clearly understood that these can happen even when everything is done following the highest safety standards.  No treatment: You can choose not to proceed with the proposed treatment alternative. The "PRO(s)" would include: avoiding the risk of complications associated with the therapy. The "CON(s)" would include: not getting any of the treatment benefits. These benefits fall under one of three categories: diagnostic; therapeutic; and/or palliative. Diagnostic benefits include: getting information which can ultimately lead to improvement of the disease or symptom(s). Therapeutic benefits are those associated with  the successful treatment of the disease. Finally, palliative benefits are those related to the decrease of the primary symptoms, without necessarily curing the condition (example: decreasing the pain from a flare-up of a chronic condition, such as incurable terminal cancer).  General Risks and Complications: These are associated to most interventional treatments. They can occur alone, or in combination. They fall under one of the following six (6) categories: no benefit or worsening of symptoms; bleeding; infection; nerve damage; allergic reactions; and/or death. No benefits or worsening of symptoms: In Medicine there are no guarantees, only probabilities. No healthcare provider can ever guarantee that a medical treatment will work, they can only state the probability that it may. Furthermore, there is always the possibility that the condition may worsen, either directly, or indirectly, as a consequence of the treatment. Bleeding: This is more common if the patient is taking a blood thinner, either prescription or over the counter (example: Goody Powders, Fish oil, Aspirin, Garlic, etc.), or if suffering a condition associated with impaired coagulation (example: Hemophilia, cirrhosis of the liver, low platelet counts, etc.). However, even if you do not have one on these, it can still happen. If you have any of these conditions, or take one of these drugs, make sure to notify your treating physician. Infection: This is more common in patients with a compromised immune system, either due to disease (example: diabetes, cancer, human immunodeficiency virus [HIV], etc.), or due to medications or treatments (example: therapies used to treat cancer and rheumatological diseases). However, even if you do not have one on these, it can still happen. If you have any of these conditions, or take one of these drugs, make sure to notify your treating physician. Nerve Damage: This is more common when the treatment is an    invasive one, but it can also happen with the use of medications, such as those used in the treatment of cancer. The damage can occur to small secondary nerves, or to large primary ones, such as those in the spinal cord and brain. This damage may be temporary or permanent and it may lead to impairments that can range from temporary numbness to permanent paralysis and/or brain death. Allergic Reactions: Any time a substance or material comes in contact with our body, there is the possibility of an allergic reaction. These can range from a mild skin rash (contact dermatitis) to a severe systemic reaction (anaphylactic reaction), which can result in death. Death: In general, any medical intervention can result in death, most of the time due to an unforeseen complication. ____________________________________________________________________________________________    ______________________________________________________________________  Preparing for your procedure  Appointments: If you think you may not be able to keep your appointment, call 24-48 hours in advance to cancel. We need time to make it available to others.  During your procedure appointment there will be: No Prescription Refills. No disability issues to discussed. No medication changes or discussions.  Instructions: Food intake: Avoid eating anything solid for at least 8 hours prior to your procedure. Clear liquid intake: You may take clear liquids such as water up to 2 hours prior to your procedure. (No carbonated drinks. No soda.) Transportation: Unless otherwise stated by your physician, bring a driver. Morning Medicines: Except for blood thinners, take all of your other morning medications with a sip of water. Make sure to take your heart and blood pressure medicines. If your blood pressure's lower number is above 100, the case will be rescheduled. Blood thinners: Make sure to stop your blood thinners as instructed.  If you take  a blood thinner, but were not instructed to stop it, call our office [phone] and ask to talk to a nurse. Not stopping a blood thinner prior to certain procedures could lead to serious complications. Diabetics on insulin: Notify the staff so that you can be scheduled 1st case in the morning. If your diabetes requires high dose insulin, take only  of your normal insulin dose the morning of the procedure and notify the staff that you have done so. Preventing infections: Shower with an antibacterial soap the morning of your procedure.  Build-up your immune system: Take 1000 mg of Vitamin C with every meal (3 times a day) the day prior to your procedure. Antibiotics: Inform the nursing staff if you are taking any antibiotics or if you have any conditions that may require antibiotics prior to procedures. (Example: recent joint implants)   Pregnancy: If you are pregnant make sure to notify the nursing staff. Not doing so may result in injury to the fetus, including death.  Sickness: If you have a cold, fever, or any active infections, call and cancel or reschedule your procedure. Receiving steroids while having an infection may result in complications. Arrival: You must be in the facility at least 30 minutes prior to your scheduled procedure. Tardiness: Your scheduled time is also the cutoff time. If you do not arrive at least 15 minutes prior to your procedure, you will be rescheduled.  Children: Do not bring any children with you. Make arrangements to keep them home. Dress appropriately: There is always a possibility that your clothing may get soiled. Avoid long dresses. Valuables: Do not bring any jewelry or valuables.  Reasons to call and reschedule or cancel your procedure: (Following these recommendations will minimize the risk   of a serious complication.) Surgeries: Avoid having procedures within 2 weeks of any surgery. (Avoid for 2 weeks before or after any surgery). Flu Shots: Avoid having  procedures within 2 weeks of a flu shots or . (Avoid for 2 weeks before or after immunizations). Barium: Avoid having a procedure within 7-10 days after having had a radiological study involving the use of radiological contrast. (Myelograms, Barium swallow or enema study). Heart attacks: Avoid any elective procedures or surgeries for the initial 6 months after a "Myocardial Infarction" (Heart Attack). Blood thinners: It is imperative that you stop these medications before procedures. Let us know if you if you take any blood thinner.  Infection: Avoid procedures during or within two weeks of an infection (including chest colds or gastrointestinal problems). Symptoms associated with infections include: Localized redness, fever, chills, night sweats or profuse sweating, burning sensation when voiding, cough, congestion, stuffiness, runny nose, sore throat, diarrhea, nausea, vomiting, cold or Flu symptoms, recent or current infections. It is specially important if the infection is over the area that we intend to treat. Heart and lung problems: Symptoms that may suggest an active cardiopulmonary problem include: cough, chest pain, breathing difficulties or shortness of breath, dizziness, ankle swelling, uncontrolled high or unusually low blood pressure, and/or palpitations. If you are experiencing any of these symptoms, cancel your procedure and contact your primary care physician for an evaluation.  Remember:  Regular Business hours are:  Monday to Thursday 8:00 AM to 4:00 PM  Provider's Schedule: Francisco Naveira, MD:  Procedure days: Tuesday and Thursday 7:30 AM to 4:00 PM  Bilal Lateef, MD:  Procedure days: Monday and Wednesday 7:30 AM to 4:00 PM  ______________________________________________________________________   Selective Nerve Root Block Patient Information  Description: Specific nerve roots exit the spinal canal and these nerves can be compressed and inflamed by a bulging disc and  bone spurs.  By injecting steroids on the nerve root, we can potentially decrease the inflammation surrounding these nerves, which often leads to decreased pain.  Also, by injecting local anesthesia on the nerve root, this can provide us helpful information to give to your referring doctor if it decreases your pain.  Selective nerve root blocks can be done along the spine from the neck to the low back depending on the location of your pain.   After numbing the skin with local anesthesia, a small needle is passed to the nerve root and the position of the needle is verified using x-ray pictures.  After the needle is in correct position, we then deposit the medication.  You may experience a pressure sensation while this is being done.  The entire block usually lasts less than 15 minutes.  Conditions that may be treated with selective nerve root blocks: Low back and leg pain Spinal stenosis Diagnostic block prior to potential surgery Neck and arm pain Post laminectomy syndrome  Preparation for the injection:  Do not eat any solid food or dairy products within 8 hours of your appointment. You may drink clear liquids up to 3 hours before an appointment.  Clear liquids include water, black coffee, juice or soda.  No milk or cream please. You may take your regular medications, including pain medications, with a sip of water before your appointment.  Diabetics should hold regular insulin (if taken separately) and take 1/2 normal NPH dose the morning of the procedure.  Carry some sugar containing items with you to your appointment. A driver must accompany you and be prepared to drive you home   after your procedure. Bring all your current medications with you. An IV may be inserted and sedation may be given at the discretion of the physician. A blood pressure cuff, EKG, and other monitors will often be applied during the procedure.  Some patients may need to have extra oxygen administered for a short  period. You will be asked to provide medical information, including allergies, prior to the procedure.  We must know immediately if you are taking blood  Thinners (like Coumadin) or if you are allergic to IV iodine contrast (dye).  Possible side-effects: All are usually temporary Bleeding from needle site Light headedness Numbness and tingling Decreased blood pressure Weakness in arms/legs Pressure sensation in back/neck Pain at injection site (several days)  Possible complications: All are extremely rare Infection Nerve injury Spinal headache (a headache wore with upright position)  Call if you experience: Fever/chills associated with headache or increased back/neck pain Headache worsened by an upright position New onset weakness or numbness of an extremity below the injection site Hives or difficulty breathing (go to the emergency room) Inflammation or drainage at the injection site(s) Severe back/neck pain greater than usual New symptoms which are concerning to you  Please note:  Although the local anesthetic injected can often make your back or neck feel good for several hours after the injection the pain will likely return.  It takes 3-5 days for steroids to work on the nerve root. You may not notice any pain relief for at least one week.  If effective, we will often do a series of 3 injections spaced 3-6 weeks apart to maximally decrease your pain.    If you have any questions, please call [phone] [facility] 

## 2023-02-28 ENCOUNTER — Other Ambulatory Visit: Payer: Self-pay

## 2023-02-28 ENCOUNTER — Ambulatory Visit (HOSPITAL_BASED_OUTPATIENT_CLINIC_OR_DEPARTMENT_OTHER): Payer: Medicare Other | Admitting: Student in an Organized Health Care Education/Training Program

## 2023-02-28 ENCOUNTER — Emergency Department: Payer: Medicare Other

## 2023-02-28 ENCOUNTER — Encounter: Payer: Self-pay | Admitting: Student in an Organized Health Care Education/Training Program

## 2023-02-28 ENCOUNTER — Emergency Department
Admission: EM | Admit: 2023-02-28 | Discharge: 2023-02-28 | Disposition: A | Discharge status: Home / Self Care | Payer: Medicare Other | Attending: Emergency Medicine | Admitting: Emergency Medicine

## 2023-02-28 ENCOUNTER — Ambulatory Visit
Admission: RE | Admit: 2023-02-28 | Discharge: 2023-02-28 | Disposition: A | Discharge status: Home / Self Care | Payer: Medicare Other | Source: Ambulatory Visit | Attending: Student in an Organized Health Care Education/Training Program | Admitting: Student in an Organized Health Care Education/Training Program

## 2023-02-28 VITALS — BP 120/80 | HR 73 | Temp 98.1°F | Resp 18 | Ht 70.0 in | Wt 137.0 lb

## 2023-02-28 DIAGNOSIS — R079 Chest pain, unspecified: Secondary | ICD-10-CM | POA: Insufficient documentation

## 2023-02-28 DIAGNOSIS — R000 Tachycardia, unspecified: Secondary | ICD-10-CM | POA: Diagnosis not present

## 2023-02-28 DIAGNOSIS — G8929 Other chronic pain: Secondary | ICD-10-CM | POA: Insufficient documentation

## 2023-02-28 DIAGNOSIS — Z8673 Personal history of transient ischemic attack (TIA), and cerebral infarction without residual deficits: Secondary | ICD-10-CM | POA: Insufficient documentation

## 2023-02-28 DIAGNOSIS — I7090 Unspecified atherosclerosis: Secondary | ICD-10-CM | POA: Insufficient documentation

## 2023-02-28 DIAGNOSIS — M4802 Spinal stenosis, cervical region: Secondary | ICD-10-CM | POA: Insufficient documentation

## 2023-02-28 DIAGNOSIS — I2510 Atherosclerotic heart disease of native coronary artery without angina pectoris: Secondary | ICD-10-CM | POA: Insufficient documentation

## 2023-02-28 DIAGNOSIS — M5412 Radiculopathy, cervical region: Secondary | ICD-10-CM

## 2023-02-28 DIAGNOSIS — R008 Other abnormalities of heart beat: Secondary | ICD-10-CM | POA: Diagnosis not present

## 2023-02-28 DIAGNOSIS — M25511 Pain in right shoulder: Secondary | ICD-10-CM

## 2023-02-28 DIAGNOSIS — G1221 Amyotrophic lateral sclerosis: Secondary | ICD-10-CM | POA: Diagnosis not present

## 2023-02-28 DIAGNOSIS — R0789 Other chest pain: Secondary | ICD-10-CM | POA: Insufficient documentation

## 2023-02-28 LAB — COMPREHENSIVE METABOLIC PANEL
ALT: 28 U/L (ref 0–44)
AST: 31 U/L (ref 15–41)
Albumin: 3.8 g/dL (ref 3.5–5.0)
Alkaline Phosphatase: 87 U/L (ref 38–126)
Anion gap: 7 (ref 5–15)
BUN: 18 mg/dL (ref 8–23)
CO2: 23 mmol/L (ref 22–32)
Calcium: 9 mg/dL (ref 8.9–10.3)
Chloride: 108 mmol/L (ref 98–111)
Creatinine, Ser: 0.67 mg/dL (ref 0.61–1.24)
GFR, Estimated: >60 mL/min (ref >60)
Glucose, Bld: 90 mg/dL (ref 70–99)
Potassium: 4.1 mmol/L (ref 3.5–5.1)
Sodium: 138 mmol/L (ref 135–145)
Total Bilirubin: 0.7 mg/dL (ref 0.3–1.2)
Total Protein: 6.3 g/dL — ABNORMAL LOW (ref 6.5–8.1)

## 2023-02-28 LAB — TROPONIN I (HIGH SENSITIVITY)
Troponin I (High Sensitivity): <2 ng/L (ref <18)
Troponin I (High Sensitivity): 3 ng/L (ref <18)
Troponin I (High Sensitivity): <2 ng/L (ref <18)

## 2023-02-28 LAB — GLUCOSE, CAPILLARY: Glucose-Capillary: 97 mg/dL (ref 70–99)

## 2023-02-28 LAB — CBC
HCT: 39.3 % (ref 39.0–52.0)
Hemoglobin: 12.9 g/dL — ABNORMAL LOW (ref 13.0–17.0)
MCH: 34 pg (ref 26.0–34.0)
MCHC: 32.8 g/dL (ref 30.0–36.0)
MCV: 103.7 fL — ABNORMAL HIGH (ref 80.0–100.0)
Platelets: 191 10*3/uL (ref 150–400)
RBC: 3.79 MIL/uL — ABNORMAL LOW (ref 4.22–5.81)
RDW: 12.8 % (ref 11.5–15.5)
WBC: 5.3 10*3/uL (ref 4.0–10.5)
nRBC: 0 % (ref 0.0–0.2)

## 2023-02-28 LAB — BRAIN NATRIURETIC PEPTIDE: B Natriuretic Peptide: 27.6 pg/mL (ref 0.0–100.0)

## 2023-02-28 LAB — CKMB (ARMC ONLY): CK, MB: 2 ng/mL (ref 0.5–5.0)

## 2023-02-28 MED ORDER — HYDROMORPHONE HCL 1 MG/ML IJ SOLN
1.0000 mg | Freq: Once | INTRAMUSCULAR | Status: AC
  Administered 2023-02-28: 1 mg via INTRAVENOUS
  Filled 2023-02-28: qty 1

## 2023-02-28 MED ORDER — IOHEXOL 350 MG/ML SOLN
100.0000 mL | Freq: Once | INTRAVENOUS | Status: AC | PRN
  Administered 2023-02-28: 100 mL via INTRAVENOUS

## 2023-02-28 MED ORDER — IOHEXOL 180 MG/ML  SOLN
10.0000 mL | Freq: Once | INTRAMUSCULAR | Status: AC
  Administered 2023-02-28: 10 mL via EPIDURAL
  Filled 2023-02-28: qty 20

## 2023-02-28 MED ORDER — ROPIVACAINE HCL 2 MG/ML IJ SOLN
1.0000 mL | Freq: Once | INTRAMUSCULAR | Status: AC
  Administered 2023-02-28: 1 mL via EPIDURAL
  Filled 2023-02-28: qty 20

## 2023-02-28 MED ORDER — DEXAMETHASONE SODIUM PHOSPHATE 10 MG/ML IJ SOLN
10.0000 mg | Freq: Once | INTRAMUSCULAR | Status: AC
  Administered 2023-02-28: 10 mg
  Filled 2023-02-28: qty 1

## 2023-02-28 MED ORDER — METHYLPREDNISOLONE ACETATE 40 MG/ML IJ SUSP
40.0000 mg | Freq: Once | INTRAMUSCULAR | Status: AC
  Administered 2023-02-28: 40 mg via INTRA_ARTICULAR
  Filled 2023-02-28: qty 1

## 2023-02-28 MED ORDER — LIDOCAINE HCL 2 % IJ SOLN
20.0000 mL | Freq: Once | INTRAMUSCULAR | Status: AC
  Administered 2023-02-28: 400 mg
  Filled 2023-02-28: qty 40

## 2023-02-28 MED ORDER — SODIUM CHLORIDE 0.9% FLUSH
1.0000 mL | Freq: Once | INTRAVENOUS | Status: AC
  Administered 2023-02-28: 1 mL

## 2023-02-28 NOTE — Progress Notes (Signed)
1120 While removing the patient from the monitor post pocedure CESI and SSNB he became diaphoretic and unresponsive. HR palpated at 42. Dr. Cherylann Ratel called to see patient.  1121 Dr. Cherylann Ratel here. Pateint not responding to verbal commands. Monitors replaced. Pilse 48, sat 90% RA, resp10, BP 73/42. O2 started at 6L [place].  1124 Glycopyrolate 0.2mg  given IM in LOG. Patient turned from prone to supine position. Patient starting to respond with noding in response to Dr. Cherylann Ratel questions.  1125 70/49-45-18-96% on 6L NBS 97mg /dl. 1610 IV LR started per K Tice. Ephedrine 10mg  IVP per Dr. Cherylann Ratel order @ 1029. 1129 Patient c/o CP that is radiating to left arm. 1130 STAT EKG ordered and CP called. [id number]%- 1133 90/60-68-21-98% 1135 108/74-67-20-98% on 6LNC 1136 Continues to c/o CP and left arm pain. STAT cardiac panel ordered.

## 2023-02-28 NOTE — Progress Notes (Signed)
1149 cardio for stat EKG in room  1155 Lab in to draw stat labs 1157 Rapid response activated 1202 Patient to ED via [place]

## 2023-02-28 NOTE — Progress Notes (Signed)
PROVIDER NOTE: Interpretation of information contained herein should be left to medically-trained personnel. Specific patient instructions are provided elsewhere under "Patient Instructions" section of medical record. This document was created in part using STT-dictation technology, any transcriptional errors that may result from this process are unintentional.  Patient: Shawn [name] Type: Established DOB: 22-Nov-1960 MRN: [id number] PCP: [facility]  Service: Procedure DOS: 02/28/2023 Setting: Ambulatory Location: Ambulatory outpatient facility Delivery: Face-to-face Provider: Edward Jolly, MD Specialty: Interventional Pain Management Specialty designation: 09 Location: Outpatient facility Ref. Prov.: [facility]*       Interventional Therapy   Procedure: Cervical Epidural Steroid injection (CESI) (Interlaminar) #1  Laterality: Right  Level: C7-T1 Imaging: Fluoroscopy-assisted DOS: 02/28/2023  Performed by: Edward Jolly, MD Anesthesia: Local anesthesia (1-2% Lidocaine)   Purpose: Diagnostic/Therapeutic Indications: Cervicalgia, cervical radicular pain, degenerative disc disease, severe enough to impact quality of life or function. 1. Cervical radicular pain   2. Foraminal stenosis of cervical region   3. Chronic right shoulder pain   4. Chest pain, unspecified type    NAS-11 score:   Pre-procedure: 8 /10   Post-procedure: 9 /10      Position  Prep  Materials:  Location setting: Procedure suite Position: Prone, on modified reverse trendelenburg to facilitate breathing, with head in head-cradle. Pillows positioned under chest (below chin-level) with cervical spine flexed. Safety Precautions: Patient was assessed for positional comfort and pressure points before starting the procedure. Prepping solution: DuraPrep (Iodine Povacrylex [0.7% available iodine] and Isopropyl Alcohol, 74% w/w) Prep Area: Entire  cervicothoracic region Approach:  percutaneous, paramedial Intended target: Posterior cervical epidural space Materials Procedure:  Tray: Epidural Needle(s): Epidural (Tuohy) Qty: 1 Length: (90mm) 3.5-inch Gauge: 22G   Pre-op H&P Assessment:  Mr. Bibeau is a 62 y.o. (year old), male patient, seen today for interventional treatment. He  has a past surgical history that includes Cystoscopy with stent placement (Left, 05/20/2015); Knee surgery (Right, 84,96, 2003); Shoulder surgery (Left, 2010); Cardiac catheterization (2006); Cystoscopy/retrograde/ureteroscopy/stone extraction with basket (2000); Cystoscopy/ureteroscopy/holmium laser (Left, 06/11/2015); Cystoscopy w/ ureteral stent removal (Left, 06/11/2015); and IR KYPHO LUMBAR INC FX REDUCE BONE BX UNI/BIL CANNULATION INC/IMAGING (01/06/2022). Mr. Zappone has a current medication list which includes the following prescription(s): acetaminophen, albuterol, atorvastatin, baclofen, fluticasone furoate-vilanterol, gabapentin, mirtazapine, montelukast, and riluzole. His primarily concern today is the Neck Pain  Initial Vital Signs:  Pulse/HCG Rate: 73ECG Heart Rate: 65 Temp: 98.1 F (36.7 C) Resp: 16 BP: 105/71 SpO2: 96 %  BMI: Estimated body mass index is 19.66 kg/m as calculated from the following:   Height as of this encounter: 5\' 10"  (1.778 m).   Weight as of this encounter: 137 lb (62.1 kg).  Risk Assessment: Allergies: Reviewed. He is allergic to aspirin, compazine [prochlorperazine], morphine and codeine, naproxen, penicillins, and tramadol.  Allergy Precautions: None required Coagulopathies: Reviewed. None identified.  Blood-thinner therapy: None at this time Active Infection(s): Reviewed. None identified. Mr. Sampley is afebrile  Site Confirmation: Mr. Collick was asked to confirm the procedure and laterality before marking the site Procedure checklist: Completed Consent: Before the procedure and under the influence of no sedative(s), amnesic(s), or anxiolytics,  the patient was informed of the treatment options, risks and possible complications. To fulfill our ethical and legal obligations, as recommended by the American Medical Association's Code of Ethics, I have informed the patient of my clinical impression; the nature and purpose of the treatment or procedure; the risks, benefits, and possible complications of the intervention; the alternatives, including doing nothing; the  risk(s) and benefit(s) of the alternative treatment(s) or procedure(s); and the risk(s) and benefit(s) of doing nothing. The patient was provided information about the general risks and possible complications associated with the procedure. These may include, but are not limited to: failure to achieve desired goals, infection, bleeding, organ or nerve damage, allergic reactions, paralysis, and death. In addition, the patient was informed of those risks and complications associated to Spine-related procedures, such as failure to decrease pain; infection (i.e.: Meningitis, epidural or intraspinal abscess); bleeding (i.e.: epidural hematoma, subarachnoid hemorrhage, or any other type of intraspinal or peri-dural bleeding); organ or nerve damage (i.e.: Any type of peripheral nerve, nerve root, or spinal cord injury) with subsequent damage to sensory, motor, and/or autonomic systems, resulting in permanent pain, numbness, and/or weakness of one or several areas of the body; allergic reactions; (i.e.: anaphylactic reaction); and/or death. Furthermore, the patient was informed of those risks and complications associated with the medications. These include, but are not limited to: allergic reactions (i.e.: anaphylactic or anaphylactoid reaction(s)); adrenal axis suppression; blood sugar elevation that in diabetics may result in ketoacidosis or comma; water retention that in patients with history of congestive heart failure may result in shortness of breath, pulmonary edema, and decompensation with  resultant heart failure; weight gain; swelling or edema; medication-induced neural toxicity; particulate matter embolism and blood vessel occlusion with resultant organ, and/or nervous system infarction; and/or aseptic necrosis of one or more joints. Finally, the patient was informed that Medicine is not an exact science; therefore, there is also the possibility of unforeseen or unpredictable risks and/or possible complications that may result in a catastrophic outcome. The patient indicated having understood very clearly. We have given the patient no guarantees and we have made no promises. Enough time was given to the patient to ask questions, all of which were answered to the patient's satisfaction. Mr. Tiegs has indicated that he wanted to continue with the procedure. Attestation: I, the ordering provider, attest that I have discussed with the patient the benefits, risks, side-effects, alternatives, likelihood of achieving goals, and potential problems during recovery for the procedure that I have provided informed consent. Date  Time: 02/28/2023  9:58 AM   Pre-Procedure Preparation:  Monitoring: As per clinic protocol. Respiration, ETCO2, SpO2, BP, heart rate and rhythm monitor placed and checked for adequate function Safety Precautions: Patient was assessed for positional comfort and pressure points before starting the procedure. Time-out: I initiated and conducted the "Time-out" before starting the procedure, as per protocol. The patient was asked to participate by confirming the accuracy of the "Time Out" information. Verification of the correct person, site, and procedure were performed and confirmed by me, the nursing staff, and the patient. "Time-out" conducted as per Joint Commission's Universal Protocol (UP.01.01.01). Time: 1110 Start Time: 1110 hrs.  Description  Narrative of Procedure:          Rationale (medical necessity): procedure needed and proper for the diagnosis and/or  treatment of the patient's medical symptoms and needs. Start Time: 1110 hrs. Safety Precautions: Aspiration looking for blood return was conducted prior to all injections. At no point did we inject any substances, as a needle was being advanced. No attempts were made at seeking any paresthesias. Safe injection practices and needle disposal techniques used. Medications properly checked for expiration dates. SDV (single dose vial) medications used. Description of procedure: Protocol guidelines were followed. The patient was assisted into a comfortable position. The target area was identified and the area prepped in the usual manner. Skin &  deeper tissues infiltrated with local anesthetic. Appropriate amount of time allowed to pass for local anesthetics to take effect. Using fluoroscopic guidance, the epidural needle was introduced through the skin, ipsilateral to the reported pain, and advanced to the target area. Posterior laminar os was contacted and the needle walked caudad, until the lamina was cleared. The ligamentum flavum was engaged and the epidural space identified using "loss-of-resistance technique" with 2-3 ml of PF-NaCl (0.9% NSS), in a 5cc dedicated LOR syringe. (See "Imaging guidance" below for use of contrast details.) Once proper needle placement was secured, and negative aspiration confirmed, the solution was injected in intermittent fashion, asking for systemic symptoms every 0.5cc. The needles were then removed and the area cleansed, making sure to leave some of the prepping solution back to take advantage of its long term bactericidal properties.  3 cc solution made of 1 cc of preservative-free saline, 1cc of 0.2% ropivacaine, 1 cc of Decadron 10 mg/cc.   Vitals:   02/28/23 1125 02/28/23 1150 02/28/23 1155 02/28/23 1200  BP: (!) 70/49 122/81 120/75 120/80  Pulse:      Resp: (!) 23 16 16 18   Temp:      TempSrc:      SpO2: 96% 100% 100% 100%  Weight:      Height:         End  Time: 1118 hrs.  Imaging Guidance (Spinal):          Type of Imaging Technique: Fluoroscopy Guidance (Spinal) Indication(s): Assistance in needle guidance and placement for procedures requiring needle placement in or near specific anatomical locations not easily accessible without such assistance. Exposure Time: Please see nurses notes. Contrast: Before injecting any contrast, we confirmed that the patient did not have an allergy to iodine, shellfish, or radiological contrast. Once satisfactory needle placement was completed at the desired level, radiological contrast was injected. Contrast injected under live fluoroscopy. No contrast complications. See chart for type and volume of contrast used. Fluoroscopic Guidance: I was personally present during the use of fluoroscopy. "Tunnel Vision Technique" used to obtain the best possible view of the target area. Parallax error corrected before commencing the procedure. "Direction-depth-direction" technique used to introduce the needle under continuous pulsed fluoroscopy. Once target was reached, antero-posterior, oblique, and lateral fluoroscopic projection used confirm needle placement in all planes. Images permanently stored in EMR. Interpretation: I personally interpreted the imaging intraoperatively. Adequate needle placement confirmed in multiple planes. Appropriate spread of contrast into desired area was observed. No evidence of afferent or efferent intravascular uptake. No intrathecal or subarachnoid spread observed. Permanent images saved into the patient's record.  Post-operative Assessment:  Post-procedure Vital Signs:  Pulse/HCG Rate: 7369 Temp: 98.1 F (36.7 C) Resp: 18 BP: 120/80 SpO2: 100 %  EBL: None  Complications: No immediate post-treatment complications observed by team, or reported by patient.  Note:  Please see nursing note.  After completion of procedure, I had already left the patient's room.  I received a call from nursing  staff that the patient was not responsive, bradycardic and hypotensive.  I got into the room and we turned the patient over onto a stretcher and he was responsive but was experiencing bradycardia and a vasovagal response as he was diaphoretic.  Patient received 0.2 mg of glycopyrrolate intramuscularly.  At that time an IV was also started and he was given IV fluids along with 10 mg of ephedrine.  His heart rate and his blood pressure increased.  He had complete upper extremity strength during this  encounter and was following my commands of squeezing my fingers flexing and extending his arm on command.  He was complaining of chest pain with radiation into his left arm.  EKG was performed which did not show any ST segment changes.  Cardiac markers were drawn and rapid response was called.  Patient was transported to the ED.  This was communicated with his brother who was sitting in the waiting room.  Post-procedural neurological assessment was performed, showing return to baseline, prior to discharge.  Comments:  No additional relevant information.  Plan of Care (POC)  Orders:  Orders Placed This Encounter  Procedures   DG PAIN CLINIC C-ARM 1-60 MIN NO REPORT    Intraoperative interpretation by procedural physician at [facility].    Standing Status:   Standing    Number of Occurrences:   1    Order Specific Question:   Reason for exam:    Answer:   Assistance in needle guidance and placement for procedures requiring needle placement in or near specific anatomical locations not easily accessible without such assistance.   Glucose, capillary   [facility] ([facility] only)    Standing Status:   Standing    Number of Occurrences:   1   EKG 12-Lead    Standing Status:   Standing    Number of Occurrences:   1     Medications ordered for procedure: Meds ordered this encounter  Medications   iohexol (OMNIPAQUE) 180 MG/ML injection 10 mL    Must be Myelogram-compatible. If not available, you may  substitute with a water-soluble, non-ionic, hypoallergenic, myelogram-compatible radiological contrast medium.   lidocaine (XYLOCAINE) 2 % (with pres) injection 400 mg   ropivacaine (PF) 2 mg/mL (0.2%) (NAROPIN) injection 1 mL   sodium chloride flush (NS) 0.9 % injection 1 mL   dexamethasone (DECADRON) injection 10 mg   methylPREDNISolone acetate (DEPO-MEDROL) injection 40 mg   Medications administered: We administered iohexol, lidocaine, ropivacaine (PF) 2 mg/mL (0.2%), sodium chloride flush, dexamethasone, and methylPREDNISolone acetate.  See the medical record for exact dosing, route, and time of administration.  Follow-up plan:   Return in about 6 weeks (around 04/11/2023) for Post Procedure Evaluation, in person.       Right cervical epidural steroid injection, right suprascapular nerve block 02/28/23    Recent Visits Date Type Provider Dept  02/06/23 Office Visit Edward Jolly, MD [facility]  12/14/22 Office Visit Edward Jolly, MD [facility]  Showing recent visits within past 90 days and meeting all other requirements Today's Visits Date Type Provider Dept  02/28/23 Procedure visit Edward Jolly, MD [facility]  Showing today's visits and meeting all other requirements Future Appointments Date Type Provider Dept  04/11/23 Appointment Edward Jolly, MD [facility]  Showing future appointments within next 90 days and meeting all other requirements  Disposition: Discharge home  Discharge (Date  Time): 02/28/2023; 1130 hrs.   Primary Care Physician: [facility] Location: [facility] Note by: Edward Jolly, MD (TTS technology used. I apologize for any typographical errors that were not detected and corrected.) Date: 02/28/2023; Time: 1:43 PM  Disclaimer:  Medicine is not an Visual merchandiser. The only guarantee in medicine is that nothing is guaranteed. It is important to note that the decision  to proceed with this intervention was based on the information collected from the patient. The Data and conclusions were drawn from the patient's questionnaire, the interview, and the physical examination. Because the  information was provided in large part by the patient, it cannot be guaranteed that it has not been purposely or unconsciously manipulated. Every effort has been made to obtain as much relevant data as possible for this evaluation. It is important to note that the conclusions that lead to this procedure are derived in large part from the available data. Always take into account that the treatment will also be dependent on availability of resources and existing treatment guidelines, considered by other Pain Management Practitioners as being common knowledge and practice, at the time of the intervention. For Medico-Legal purposes, it is also important to point out that variation in procedural techniques and pharmacological choices are the acceptable norm. The indications, contraindications, technique, and results of the above procedure should only be interpreted and judged by a Board-Certified Interventional Pain Specialist with extensive familiarity and expertise in the same exact procedure and technique.

## 2023-02-28 NOTE — ED Provider Notes (Signed)
Care assumed of patient from outgoing provider.  See their note for initial history, exam and plan.  Clinical Course as of 02/28/23 1547  Wed Feb 28, 2023  1547 Patient presents with chest pain.  Currently chest pain-free.  CTA without signs of pulmonary embolism.  Initial troponin was negative.  Plan for repeat troponin and if negative plan to discharge home with outpatient follow-up. [SM]    Clinical Course User Index [SM] Corena Herter, MD  Serial troponins negative.  Discharged home with outpatient follow-up.   Corena Herter, MD 02/28/23 [phone]

## 2023-02-28 NOTE — Patient Instructions (Signed)
____________________________________________________________________________________________  Post-Procedure Discharge Instructions  Instructions: Apply ice:  Purpose: This will minimize any swelling and discomfort after procedure.  When: Day of procedure, as soon as you get home. How: Fill a plastic sandwich bag with crushed ice. Cover it with a small towel and apply to injection site. How long: (15 min on, 15 min off) Apply for 15 minutes then remove x 15 minutes.  Repeat sequence on day of procedure, until you go to bed. Apply heat:  Purpose: To treat any soreness and discomfort from the procedure. When: Starting the next day after the procedure. How: Apply heat to procedure site starting the day following the procedure. How long: May continue to repeat daily, until discomfort goes away. Food intake: Start with clear liquids (like water) and advance to regular food, as tolerated.  Physical activities: Keep activities to a minimum for the first 8 hours after the procedure. After that, then as tolerated. Driving: If you have received any sedation, be responsible and do not drive. You are not allowed to drive for 24 hours after having sedation. Blood thinner: (Applies only to those taking blood thinners) You may restart your blood thinner 6 hours after your procedure. Insulin: (Applies only to Diabetic patients taking insulin) As soon as you can eat, you may resume your normal dosing schedule. Infection prevention: Keep procedure site clean and dry. Shower daily and clean area with soap and water. Post-procedure Pain Diary: Extremely important that this be done correctly and accurately. Recorded information will be used to determine the next step in treatment. For the purpose of accuracy, follow these rules: Evaluate only the area treated. Do not report or include pain from an untreated area. For the purpose of this evaluation, ignore all other areas of pain, except for the treated  area. After your procedure, avoid taking a long nap and attempting to complete the pain diary after you wake up. Instead, set your alarm clock to go off every hour, on the hour, for the initial 8 hours after the procedure. Document the duration of the numbing medicine, and the relief you are getting from it. Do not go to sleep and attempt to complete it later. It will not be accurate. If you received sedation, it is likely that you were given a medication that may cause amnesia. Because of this, completing the diary at a later time may cause the information to be inaccurate. This information is needed to plan your care. Follow-up appointment: Keep your post-procedure follow-up evaluation appointment after the procedure (usually 2 weeks for most procedures, 6 weeks for radiofrequencies). DO NOT FORGET to bring you pain diary with you.   Expect: (What should I expect to see with my procedure?) From numbing medicine (AKA: Local Anesthetics): Numbness or decrease in pain. You may also experience some weakness, which if present, could last for the duration of the local anesthetic. Onset: Full effect within 15 minutes of injected. Duration: It will depend on the type of local anesthetic used. On the average, 1 to 8 hours.  From steroids (Applies only if steroids were used): Decrease in swelling or inflammation. Once inflammation is improved, relief of the pain will follow. Onset of benefits: Depends on the amount of swelling present. The more swelling, the longer it will take for the benefits to be seen. In some cases, up to 10 days. Duration: Steroids will stay in the system x 2 weeks. Duration of benefits will depend on multiple posibilities including persistent irritating factors. Side-effects: If present, they  may typically last 2 weeks (the duration of the steroids). Frequent: Cramps (if they occur, drink Gatorade and take over-the-counter Magnesium 450-500 mg once to twice a day); water retention with  temporary weight gain; increases in blood sugar; decreased immune system response; increased appetite. Occasional: Facial flushing (red, warm cheeks); mood swings; menstrual changes. Uncommon: Long-term decrease or suppression of natural hormones; bone thinning. (These are more common with higher doses or more frequent use. This is why we prefer that our patients avoid having any injection therapies in other practices.)  Very Rare: Severe mood changes; psychosis; aseptic necrosis. From procedure: Some discomfort is to be expected once the numbing medicine wears off. This should be minimal if ice and heat are applied as instructed.  Call if: (When should I call?) You experience numbness and weakness that gets worse with time, as opposed to wearing off. New onset bowel or bladder incontinence. (Applies only to procedures done in the spine)  Emergency Numbers: Durning business hours (Monday - Thursday, 8:00 AM - 4:00 PM) (Friday, 9:00 AM - 12:00 Noon): [phone] After hours: [phone] NOTE: If you are having a problem and are unable connect with, or to talk to a provider, then go to your nearest urgent care or emergency department. If the problem is serious and urgent, please call 911. ____________________________________________________________________________________________   Trigger Point Injection Trigger points are areas where you have pain. A trigger point injection is a shot given in the trigger point to help relieve pain for a few days to a few months. Common places for trigger points include the neck, shoulders, upper back, or lower back. A trigger point injection will not cure long-term (chronic) pain permanently. These injections do not always work for every person. For some people, they can help to relieve pain for a few days to a few months. Tell a health care provider about: Any allergies you have. All medicines you are taking, including vitamins, herbs, eye drops, creams,  and over-the-counter medicines. Any problems you or family members have had with anesthetic medicines. Any bleeding problems you have. Any surgeries you have had. Any medical conditions you have. Whether you are pregnant or may be pregnant. What are the risks? Generally, this is a safe procedure. However, problems may occur, including: Infection. Bleeding or bruising. Allergic reaction to the injected medicine. Irritation of the skin around the injection site. What happens before the procedure? Ask your health care provider about: Changing or stopping your regular medicines. This is especially important if you are taking diabetes medicines or blood thinners. Taking medicines such as aspirin and ibuprofen. These medicines can thin your blood. Do not take these medicines unless your health care provider tells you to take them. Taking over-the-counter medicines, vitamins, herbs, and supplements. What happens during the procedure?  Your health care provider will feel for trigger points. A marker may be used to circle the area for the injection. The skin over the trigger point will be washed with a germ-killing soap. You may be given a medicine to help you relax (sedative). A thin needle is used for the injection. You may feel pain or a twitching feeling when the needle enters your skin. A numbing solution may be injected into the trigger point. Sometimes a medicine to keep down inflammation is also injected. Your health care provider may move the needle around the area where the trigger point is located until the tightness and twitching goes away. After the injection, your health care provider may put gentle  pressure over the injection site. The injection site will be covered with a bandage (dressing). The procedure may vary among health care providers and hospitals. What can I expect after treatment? After treatment, you may have soreness and stiffness for 1-2 days. Follow these  instructions at home: Injection site care Remove your dressing in a few hours, or as told by your health care provider. Check your injection site every day for signs of infection. Check for: Redness, swelling, or pain. Fluid or blood. Warmth. Pus or a bad smell. Managing pain, stiffness, and swelling If directed, put ice on the affected area. To do this: Put ice in a plastic bag. Place a towel between your skin and the bag. Leave the ice on for 20 minutes, 2-3 times a day. Remove the ice if your skin turns bright red. This is very important. If you cannot feel pain, heat, or cold, you have a greater risk of damage to the area. Activity If you were given a sedative during the procedure, it can affect you for several hours. Do not drive or operate machinery until your health care provider says that it is safe. Do not take baths, swim, or use a hot tub until your health care provider approves. Return to your normal activities as told by your health care provider. Ask your health care provider what activities are safe for you. General instructions If you were asked to stop your regular medicines, ask your health care provider when you may start taking them again. You may be asked to see an occupational or physical therapist for exercises that reduce muscle strain and stretch the area of the trigger point. Keep all follow-up visits. This is important. Contact a health care provider if: Your pain comes back, and it is worse than before the injection. You may need more injections. You have chills or a fever. The injection site becomes more painful, red, swollen, or warm to the touch. Summary A trigger point injection is a shot given in the trigger point to help relieve pain. Common places for trigger point injections are the neck, shoulders, upper back, and lower back. These injections do not always work for every person, but for some people, the injections can help to relieve pain for a few  days to a few months. Contact a health care provider if symptoms come back or if they are worse than before treatment. Also, get help if the injection site becomes more painful, red, swollen, or warm to the touch. This information is not intended to replace advice given to you by your health care provider. Make sure you discuss any questions you have with your health care provider. Document Revised: 12/28/2020 Document Reviewed: 12/28/2020 Elsevier Patient Education  2024 [employer] Epidural Steroid Injection Patient Information  Description: The epidural space surrounds the nerves as they exit the spinal cord.  In some patients, the nerves can be compressed and inflamed by a bulging disc or a tight spinal canal (spinal stenosis).  By injecting steroids into the epidural space, we can bring irritated nerves into direct contact with a potentially helpful medication.  These steroids act directly on the irritated nerves and can reduce swelling and inflammation which often leads to decreased pain.  Epidural steroids may be injected anywhere along the spine and from the neck to the low back depending upon the location of your pain.   After numbing the skin with local anesthetic (like Novocaine), a small needle is passed into the epidural space slowly.  You may experience a sensation of pressure while this is being done.  The entire block usually last less than 10 minutes.  Conditions which may be treated by epidural steroids:  Low back and leg pain Neck and arm pain Spinal stenosis Post-laminectomy syndrome Herpes zoster (shingles) pain Pain from compression fractures  Preparation for the injection:  Do not eat any solid food or dairy products within 8 hours of your appointment.  You may drink clear liquids up to 3 hours before appointment.  Clear liquids include water, black coffee, juice or soda.  No milk or cream please. You may take your regular medication, including pain medications, with  a sip of water before your appointment  Diabetics should hold regular insulin (if taken separately) and take 1/2 normal NPH dos the morning of the procedure.  Carry some sugar containing items with you to your appointment. A driver must accompany you and be prepared to drive you home after your procedure.  Bring all your current medications with your. An IV may be inserted and sedation may be given at the discretion of the physician.   A blood pressure cuff, EKG and other monitors will often be applied during the procedure.  Some patients may need to have extra oxygen administered for a short period. You will be asked to provide medical information, including your allergies, prior to the procedure.  We must know immediately if you are taking blood thinners (like Coumadin/Warfarin)  Or if you are allergic to IV iodine contrast (dye). We must know if you could possible be pregnant.  Possible side-effects: Bleeding from needle site Infection (rare, may require surgery) Nerve injury (rare) Numbness & tingling (temporary) Difficulty urinating (rare, temporary) Spinal headache ( a headache worse with upright posture) Light -headedness (temporary) Pain at injection site (several days) Decreased blood pressure (temporary) Weakness in arm/leg (temporary) Pressure sensation in back/neck (temporary)  Call if you experience: Fever/chills associated with headache or increased back/neck pain. Headache worsened by an upright position. New onset weakness or numbness of an extremity below the injection site Hives or difficulty breathing (go to the emergency room) Inflammation or drainage at the infection site Severe back/neck pain Any new symptoms which are concerning to you  Please note:  Although the local anesthetic injected can often make your back or neck feel good for several hours after the injection, the pain will likely return.  It takes 3-7 days for steroids to work in the epidural space.   You may not notice any pain relief for at least that one week.  If effective, we will often do a series of three injections spaced 3-6 weeks apart to maximally decrease your pain.  After the initial series, we generally will wait several months before considering a repeat injection of the same type.  If you have any questions, please call [phone] [facility]

## 2023-02-28 NOTE — Progress Notes (Signed)
1139 capnography 28, HR 69, sat 100% 6LNC, RR 21, 108/74  1140 Cardiopulmonary here for STAT EKG.  1142 113/[id number]-[id number], 100% 6LNC Alert, continues to complain of Chest pain. Dr. Cherylann Ratel remains at bedside. 1144 117/77-66-18-100% . Color pale, eyes open and responding to verbal commands. No further diaphoresis noted.  1153 Lab here for cardia panel draw. 1155 Decision per Dr. Cherylann Ratel to call rapid response. 1156 Rapid response called. 1202 Rapid response team here. Report per Truett Perna and Dr. Cherylann Ratel. Patient to ED via stretcher.

## 2023-02-28 NOTE — ED Notes (Signed)
Sent 2 Lav and lt grn to lab

## 2023-02-28 NOTE — Progress Notes (Signed)
   02/28/23 1300  Spiritual Encounters  Type of Visit Follow up  Care provided to: Pt and family  Referral source Chaplain team  Reason for visit Urgent spiritual support  OnCall Visit Yes  Spiritual Framework  Presenting Themes Meaning/purpose/sources of inspiration  Community/Connection Friend(s);Family;Faith community  Patient Stress Factors Health changes  Interventions  Spiritual Care Interventions Made Established relationship of care and support;Compassionate presence;Reflective listening;Encouragement  Intervention Outcomes  Outcomes Connection to spiritual care  Spiritual Care Plan  Spiritual Care Issues Still Outstanding Chaplain will continue to follow   Follow up with patient. Tilford Pillar originally saw the patient and family when he was rushed to the ED. Chaplain went to see how the patient and family were doing. Informed the family that I also went upstairs to speak with their other family members to check on their dad who is in the hospital as well. Also told family if they need the chaplain to come back to have the nurse page chaplain services.

## 2023-02-28 NOTE — ED Provider Notes (Signed)
[facility] Provider Note    Event Date/Time   First MD Initiated Contact with Patient 02/28/23 1238     (approximate)   History   Chest Pain and Neck Pain   HPI  Shawn [name] is a 62 y.o. male   Past medical history of ALS, chronic pain, CAD, hyperlipidemia, prior stroke, here from his pain medicine cervical epidural injection appointment after developing acute onset chest pain neck pain radiating to left arm severe during his procedure.  He reports being in his regular state of health prior to this procedure.  Continues to have some left-sided chest pain and left sided neck pain, with some radiation of the pain down his arm on the left side.  Independent Historian contributed to assessment above: His family member at bedside corroborates information given above  External Medical Documents Reviewed: From earlier today's pain clinic documenting his acute onset pain      Physical Exam   Triage Vital Signs: ED Triage Vitals  Enc Vitals Group     BP 02/28/23 1221 (!) 134/90     Pulse Rate 02/28/23 1221 80     Resp 02/28/23 1221 13     Temp 02/28/23 1221 (!) 97.3 F (36.3 C)     Temp Source 02/28/23 1221 Axillary     SpO2 02/28/23 1230 97 %     Weight --      Height --      Head Circumference --      Peak Flow --      Pain Score --      Pain Loc --      Pain Edu? --      Excl. in GC? --     Most recent vital signs: Vitals:   02/28/23 1445 02/28/23 1500  BP:  125/83  Pulse: 91 89  Resp: 19 18  Temp:    SpO2: 95% 96%    General: Awake, no distress.  CV:  Good peripheral perfusion.  Resp:  Normal effort.  Abd:  No distention.  Other:  Dysarthria is at baseline per patient and wife at bedside.  Lungs clear abdomen soft and nontender -no pulses equal bilaterally.  Moving all extremities.  Neck supple with full range of motion.   ED Results / Procedures / Treatments   Labs (all labs ordered are listed, but only abnormal  results are displayed) Labs Reviewed  CBC - Abnormal; Notable for the following components:      Result Value   RBC 3.79 (*)    Hemoglobin 12.9 (*)    MCV 103.7 (*)    All other components within normal limits  COMPREHENSIVE METABOLIC PANEL - Abnormal; Notable for the following components:   Total Protein 6.3 (*)    All other components within normal limits  BRAIN NATRIURETIC PEPTIDE  TROPONIN I (HIGH SENSITIVITY)  TROPONIN I (HIGH SENSITIVITY)     I ordered and reviewed the above labs they are notable for initial troponin is negative.  White blood cell count normal.  EKG  ED ECG REPORT I, Pilar Jarvis, the attending physician, personally viewed and interpreted this ECG.   Date: 02/28/2023  EKG Time: 1220  Rate: 76  Rhythm: sinus tachycardia  Axis: nl  Intervals:none  ST&T Change: no stemi    RADIOLOGY I independently reviewed and interpreted CT angiogram of the chest abdomen pelvis see no obvious dissection   PROCEDURES:  Critical Care performed: No  Procedures   MEDICATIONS ORDERED IN ED:  Medications  HYDROmorphone (DILAUDID) injection 1 mg (1 mg Intravenous Given 02/28/23 1257)  iohexol (OMNIPAQUE) 350 MG/ML injection 100 mL (100 mLs Intravenous Contrast Given 02/28/23 1427)  HYDROmorphone (DILAUDID) injection 1 mg (1 mg Intravenous Given 02/28/23 1457)    IMPRESSION / MDM / ASSESSMENT AND PLAN / ED COURSE  I reviewed the triage vital signs and the nursing notes.                                Patient's presentation is most consistent with acute presentation with potential threat to life or bodily function.  Differential diagnosis includes, but is not limited to, ACS, dissection, epidural hematoma, intra-abdominal infection, PE   The patient is on the cardiac monitor to evaluate for evidence of arrhythmia and/or significant heart rate changes.  MDM: Broad differential diagnosis with this patient with acute onset chest pain and neck pain but fortunately  CT angiogram shows no signs of dissection and MRI of the cervical spine shows no hematoma.  His pain was controlled with IV Dilaudid.  Troponins have been flat and so at this time I doubt any cardiopulmonary vascular emergencies or spinal cord pathologies that account for his pain.  He will follow-up with his pain management clinic as scheduled.     Clinical Course as of 02/28/23 1614  Wed Feb 28, 2023  1547 Patient presents with chest pain.  Currently chest pain-free.  CTA without signs of pulmonary embolism.  Initial troponin was negative.  Plan for repeat troponin and if negative plan to discharge home with outpatient follow-up. [SM]    Clinical Course User Index [SM] Corena Herter, MD     FINAL CLINICAL IMPRESSION(S) / ED DIAGNOSES   Final diagnoses:  None     Rx / DC Orders   ED Discharge Orders     None        Note:  This document was prepared using Dragon voice recognition software and may include unintentional dictation errors.    Pilar Jarvis, MD 02/28/23 [phone]

## 2023-02-28 NOTE — Progress Notes (Signed)
Please refer to my note for vital signs from 1125-1150.

## 2023-02-28 NOTE — Progress Notes (Signed)
Safety precautions to be maintained throughout the outpatient stay will include: orient to surroundings, keep bed in low position, maintain call bell within reach at all times, provide assistance with transfer out of bed and ambulation.  

## 2023-02-28 NOTE — Discharge Instructions (Signed)
Follow-up with your pain clinic for appointment this week.  If you have any new, worsening, or unexpected symptoms come back to the emergency department for reevaluation.

## 2023-02-28 NOTE — ED Triage Notes (Signed)
Pt arrives via Rapid response w.c.o CP radiating to left arm. Pt was in pain clinic having a LP procedure when he starting c/o CP radiating to left arm and a HR down to 45. On arrival in room. Pt is also c/o neck pain. Pt has hx of ALS

## 2023-02-28 NOTE — Progress Notes (Signed)
  Chaplain On-Call responded to Rapid Response notification to [facility] [address] (Pain Clinic).  The patient, Shawn [name], was being treated by the RR Team, and was being transported to the Emergency Department, room 9.  Chaplain met patient's brother, Sela Hilding, in the Pain Clinic Waiting Area. Chaplain accompanied him to Inpatient Room 254 where his step-father, Doyle Askew, is a patient. Trey Paula informed his mother, Madie Reno, about the Rapid Response event for [facility].  Chaplain accompanied Ollen Gross and Trey Paula to the Emergency Department, room 9, where they could be with Fredrik Cove. Chaplain provided prayer and much spiritual and emotional support.  Oncoming Chaplain Ricka Burdock will follow up this afternoon.  Chaplain Morene Crocker., [facility]

## 2023-02-28 NOTE — Progress Notes (Signed)
PROVIDER NOTE: Interpretation of information contained herein should be left to medically-trained personnel. Specific patient instructions are provided elsewhere under "Patient Instructions" section of medical record. This document was created in part using STT-dictation technology, any transcriptional errors that may result from this process are unintentional.  Patient: Shawn [name] Type: Established DOB: 09-26-1961 MRN: [id number] PCP: [facility]  Service: Procedure DOS: 02/28/2023 Setting: Ambulatory Location: Ambulatory outpatient facility Delivery: Face-to-face Provider: Edward Jolly, MD Specialty: Interventional Pain Management Specialty designation: 09 Location: Outpatient facility Ref. Prov.: [employer]*       Interventional Therapy   Procedure: Suprascapular nerve block (SSNB) #1  Laterality:  Right  Level: Superior to scapular spine, lateral to supraspinatus fossa (Suprascapular notch).  Imaging: Fluoroscopic guidance         Anesthesia: Local anesthesia (1-2% Lidocaine)  DOS: 02/28/2023  Performed by: Edward Jolly, MD  Purpose: Diagnostic/Therapeutic Indications: Shoulder pain, severe enough to impact quality of life and/or function. 1. Cervical radicular pain   2. Foraminal stenosis of cervical region   3. Chronic right shoulder pain   4. Chest pain, unspecified type    NAS-11 score:   Pre-procedure: 8 /10   Post-procedure: 9 /10     Target: Suprascapular nerve Location: midway between the medial border of the scapula and the acromion as it runs through the suprascapular notch. Region: Suprascapular, posterior shoulder  Approach: Percutaneous  Neuroanatomy: The suprascapular nerve is the lateral branch of the superior trunk of the brachial plexus. It receives nerve fibers that originate in the nerve roots C5 and C6 (and sometimes C4). It is a mixed nerve, meaning that it provides both sensory and motor supply for the suprascapular  region. Function: The main function of this nerve is to provide motor innervation for two muscles, the supraspinatus and infraspinatus muscles. They are part of the rotator cuff muscles. In addition, the suprascapular nerve provides a sensory supply to the joints of the scapula (glenohumeral and acromioclavicular joints). Rationale (medical necessity): procedure needed and proper for the diagnosis and/or treatment of the patient's medical symptoms and needs.  Position / Prep / Materials:  Position: Prone Materials:  Tray: Block Needle(s):  Type: Spinal  Gauge (G): 22  Length: 3.5 in.  Qty: 1 Prep solution: DuraPrep (Iodine Povacrylex [0.7% available iodine] and Isopropyl Alcohol, 74% w/w) Prep Area: Entire posterior shoulder area. From upper spine to shoulder proper (upper arm), and from lateral neck to lower tip of shoulder blade.   Pre-op H&P Assessment:  [name] is a 62 y.o. (year old), male patient, seen today for interventional treatment. He  has a past surgical history that includes Cystoscopy with stent placement (Left, 05/20/2015); Knee surgery (Right, 84,96, 2003); Shoulder surgery (Left, 2010); Cardiac catheterization (2006); Cystoscopy/retrograde/ureteroscopy/stone extraction with basket (2000); Cystoscopy/ureteroscopy/holmium laser (Left, 06/11/2015); Cystoscopy w/ ureteral stent removal (Left, 06/11/2015); and IR KYPHO LUMBAR INC FX REDUCE BONE BX UNI/BIL CANNULATION INC/IMAGING (01/06/2022). [name] has a current medication list which includes the following prescription(s): acetaminophen, albuterol, atorvastatin, baclofen, fluticasone furoate-vilanterol, gabapentin, mirtazapine, montelukast, and riluzole. His primarily concern today is the Neck Pain  Initial Vital Signs:  Pulse/HCG Rate: 73ECG Heart Rate: 65 Temp: 98.1 F (36.7 C) Resp: 16 BP: 105/71 SpO2: 96 %  BMI: Estimated body mass index is 19.66 kg/m as calculated from the following:   Height as of this encounter:  5\' 10"  (1.778 m).   Weight as of this encounter: 137 lb (62.1 kg).  Risk Assessment: Allergies: Reviewed. He is allergic  to aspirin, compazine [prochlorperazine], morphine and codeine, naproxen, penicillins, and tramadol.  Allergy Precautions: None required Coagulopathies: Reviewed. None identified.  Blood-thinner therapy: None at this time Active Infection(s): Reviewed. None identified. [name] is afebrile  Site Confirmation: [name] was asked to confirm the procedure and laterality before marking the site Procedure checklist: Completed Consent: Before the procedure and under the influence of no sedative(s), amnesic(s), or anxiolytics, the patient was informed of the treatment options, risks and possible complications. To fulfill our ethical and legal obligations, as recommended by the American Medical Association's Code of Ethics, I have informed the patient of my clinical impression; the nature and purpose of the treatment or procedure; the risks, benefits, and possible complications of the intervention; the alternatives, including doing nothing; the risk(s) and benefit(s) of the alternative treatment(s) or procedure(s); and the risk(s) and benefit(s) of doing nothing. The patient was provided information about the general risks and possible complications associated with the procedure. These may include, but are not limited to: failure to achieve desired goals, infection, bleeding, organ or nerve damage, allergic reactions, paralysis, and death. In addition, the patient was informed of those risks and complications associated to the procedure, such as failure to decrease pain; infection; bleeding; organ or nerve damage with subsequent damage to sensory, motor, and/or autonomic systems, resulting in permanent pain, numbness, and/or weakness of one or several areas of the body; allergic reactions; (i.e.: anaphylactic reaction); and/or death. Furthermore, the patient was informed of those  risks and complications associated with the medications. These include, but are not limited to: allergic reactions (i.e.: anaphylactic or anaphylactoid reaction(s)); adrenal axis suppression; blood sugar elevation that in diabetics may result in ketoacidosis or comma; water retention that in patients with history of congestive heart failure may result in shortness of breath, pulmonary edema, and decompensation with resultant heart failure; weight gain; swelling or edema; medication-induced neural toxicity; particulate matter embolism and blood vessel occlusion with resultant organ, and/or nervous system infarction; and/or aseptic necrosis of one or more joints. Finally, the patient was informed that Medicine is not an exact science; therefore, there is also the possibility of unforeseen or unpredictable risks and/or possible complications that may result in a catastrophic outcome. The patient indicated having understood very clearly. We have given the patient no guarantees and we have made no promises. Enough time was given to the patient to ask questions, all of which were answered to the patient's satisfaction. Mr. Elzey has indicated that he wanted to continue with the procedure. Attestation: I, the ordering provider, attest that I have discussed with the patient the benefits, risks, side-effects, alternatives, likelihood of achieving goals, and potential problems during recovery for the procedure that I have provided informed consent. Date  Time: 02/28/2023  9:58 AM  Pre-Procedure Preparation:  Monitoring: As per clinic protocol. Respiration, ETCO2, SpO2, BP, heart rate and rhythm monitor placed and checked for adequate function Safety Precautions: Patient was assessed for positional comfort and pressure points before starting the procedure. Time-out: I initiated and conducted the "Time-out" before starting the procedure, as per protocol. The patient was asked to participate by confirming the accuracy  of the "Time Out" information. Verification of the correct person, site, and procedure were performed and confirmed by me, the nursing staff, and the patient. "Time-out" conducted as per Joint Commission's Universal Protocol (UP.01.01.01). Time: 1110 Start Time: 1110 hrs.  Description of Procedure:          Procedural Technique Safety Precautions: Aspiration looking for blood return was  conducted prior to all injections. At no point did we inject any substances, as a needle was being advanced. No attempts were made at seeking any paresthesias. Safe injection practices and needle disposal techniques used. Medications properly checked for expiration dates. SDV (single dose vial) medications used. Description of the Procedure: Protocol guidelines were followed. The patient was placed in position over the procedure table. The target area was identified and the area prepped in the usual manner. Skin & deeper tissues infiltrated with local anesthetic. Appropriate amount of time allowed to pass for local anesthetics to take effect. The procedure needles were then advanced to the target area. Proper needle placement secured. Negative aspiration confirmed. Solution injected in intermittent fashion, asking for systemic symptoms every 0.5cc of injectate. The needles were then removed and the area cleansed, making sure to leave some of the prepping solution back to take advantage of its long term bactericidal properties.  5 cc solution made of 4 cc of 0.2% ropivacaine, 1 cc of methylprednisolone, 40 mg/cc.  Injected along the right suprascapular nerve.  Vitals:   02/28/23 1125 02/28/23 1150 02/28/23 1155 02/28/23 1200  BP: (!) 70/49 122/81 120/75 120/80  Pulse:      Resp: (!) 23 16 16 18   Temp:      TempSrc:      SpO2: 96% 100% 100% 100%  Weight:      Height:         Start Time: 1110 hrs. End Time: 1118 hrs.  Imaging Guidance (Spinal):          Type of Imaging Technique: Fluoroscopy Guidance  (Spinal) Indication(s): Assistance in needle guidance and placement for procedures requiring needle placement in or near specific anatomical locations not easily accessible without such assistance. Exposure Time: Please see nurses notes. Contrast: None used. Fluoroscopic Guidance: I was personally present during the use of fluoroscopy. "Tunnel Vision Technique" used to obtain the best possible view of the target area. Parallax error corrected before commencing the procedure. "Direction-depth-direction" technique used to introduce the needle under continuous pulsed fluoroscopy. Once target was reached, antero-posterior, oblique, and lateral fluoroscopic projection used confirm needle placement in all planes. Images permanently stored in EMR. Interpretation: No contrast injected. I personally interpreted the imaging intraoperatively. Adequate needle placement confirmed in multiple planes. Permanent images saved into the patient's record.  Antibiotic Prophylaxis:   Anti-infectives (From admission, onward)    None      Indication(s): None identified  Post-operative Assessment:  Post-procedure Vital Signs:  Pulse/HCG Rate: 7369 Temp: 98.1 F (36.7 C) Resp: 18 BP: 120/80 SpO2: 100 %  EBL: None  Complications: No immediate post-treatment complications observed by team, or reported by patient.  Note: See procedure note from cervical epidural steroid injection detailing vasovagal response and adverse event.  Comments:  No additional relevant information.  Plan of Care (POC)  Orders:  Orders Placed This Encounter  Procedures   DG PAIN CLINIC C-ARM 1-60 MIN NO REPORT    Intraoperative interpretation by procedural physician at [facility].    Standing Status:   Standing    Number of Occurrences:   1    Order Specific Question:   Reason for exam:    Answer:   Assistance in needle guidance and placement for procedures requiring needle placement in or near specific anatomical  locations not easily accessible without such assistance.   Glucose, capillary   [facility] ([facility] only)    Standing Status:   Standing    Number of Occurrences:  1   EKG 12-Lead    Standing Status:   Standing    Number of Occurrences:   1     Medications ordered for procedure: Meds ordered this encounter  Medications   iohexol (OMNIPAQUE) 180 MG/ML injection 10 mL    Must be Myelogram-compatible. If not available, you may substitute with a water-soluble, non-ionic, hypoallergenic, myelogram-compatible radiological contrast medium.   lidocaine (XYLOCAINE) 2 % (with pres) injection 400 mg   ropivacaine (PF) 2 mg/mL (0.2%) (NAROPIN) injection 1 mL   sodium chloride flush (NS) 0.9 % injection 1 mL   dexamethasone (DECADRON) injection 10 mg   methylPREDNISolone acetate (DEPO-MEDROL) injection 40 mg   Medications administered: We administered iohexol, lidocaine, ropivacaine (PF) 2 mg/mL (0.2%), sodium chloride flush, dexamethasone, and methylPREDNISolone acetate.  See the medical record for exact dosing, route, and time of administration.  Follow-up plan:   Return in about 6 weeks (around 04/11/2023) for Post Procedure Evaluation, in person.       Recent Visits Date Type Provider Dept  02/06/23 Office Visit Edward Jolly, MD [facility]  12/14/22 Office Visit Edward Jolly, MD [facility]  Showing recent visits within past 90 days and meeting all other requirements Today's Visits Date Type Provider Dept  02/28/23 Procedure visit Edward Jolly, MD [facility]  Showing today's visits and meeting all other requirements Future Appointments Date Type Provider Dept  04/11/23 Appointment Edward Jolly, MD [facility]  Showing future appointments within next 90 days and meeting all other requirements  Disposition: Discharge home  Discharge (Date  Time): 02/28/2023; 1130 hrs.   Primary Care Physician: [facility] Location: [facility]  Outpatient Pain Management Facility Note by: Edward Jolly, MD (TTS technology used. I apologize for any typographical errors that were not detected and corrected.) Date: 02/28/2023; Time: 1:45 PM  Disclaimer:  Medicine is not an Visual merchandiser. The only guarantee in medicine is that nothing is guaranteed. It is important to note that the decision to proceed with this intervention was based on the information collected from the patient. The Data and conclusions were drawn from the patient's questionnaire, the interview, and the physical examination. Because the information was provided in large part by the patient, it cannot be guaranteed that it has not been purposely or unconsciously manipulated. Every effort has been made to obtain as much relevant data as possible for this evaluation. It is important to note that the conclusions that lead to this procedure are derived in large part from the available data. Always take into account that the treatment will also be dependent on availability of resources and existing treatment guidelines, considered by other Pain Management Practitioners as being common knowledge and practice, at the time of the intervention. For Medico-Legal purposes, it is also important to point out that variation in procedural techniques and pharmacological choices are the acceptable norm. The indications, contraindications, technique, and results of the above procedure should only be interpreted and judged by a Board-Certified Interventional Pain Specialist with extensive familiarity and expertise in the same exact procedure and technique.

## 2023-03-01 ENCOUNTER — Telehealth: Payer: Self-pay

## 2023-03-01 NOTE — Telephone Encounter (Signed)
Called PP. Phone went directly to voicemail. Message to check on him and see how things were going. Instructed to call if needed/

## 2023-04-11 ENCOUNTER — Ambulatory Visit
Payer: Medicare Other | Attending: Student in an Organized Health Care Education/Training Program | Admitting: Student in an Organized Health Care Education/Training Program

## 2023-04-11 ENCOUNTER — Encounter: Payer: Self-pay | Admitting: Student in an Organized Health Care Education/Training Program

## 2023-04-11 VITALS — BP 118/92 | HR 104 | Temp 98.1°F | Ht 70.0 in | Wt 137.0 lb

## 2023-04-11 DIAGNOSIS — R079 Chest pain, unspecified: Secondary | ICD-10-CM | POA: Insufficient documentation

## 2023-04-11 DIAGNOSIS — M25511 Pain in right shoulder: Secondary | ICD-10-CM | POA: Diagnosis not present

## 2023-04-11 DIAGNOSIS — M4802 Spinal stenosis, cervical region: Secondary | ICD-10-CM | POA: Diagnosis not present

## 2023-04-11 DIAGNOSIS — M5412 Radiculopathy, cervical region: Secondary | ICD-10-CM | POA: Insufficient documentation

## 2023-04-11 DIAGNOSIS — G8929 Other chronic pain: Secondary | ICD-10-CM | POA: Diagnosis present

## 2023-04-11 DIAGNOSIS — G894 Chronic pain syndrome: Secondary | ICD-10-CM | POA: Insufficient documentation

## 2023-04-11 MED ORDER — HYDROCODONE-ACETAMINOPHEN 5-325 MG PO TABS: 1.0000 | ORAL_TABLET | Freq: Three times a day (TID) | ORAL | 0 refills | Status: AC | PRN

## 2023-04-11 MED ORDER — PREDNISONE 20 MG PO TABS: ORAL_TABLET | ORAL | 0 refills | Status: AC

## 2023-04-11 MED ORDER — HYDROCODONE-ACETAMINOPHEN 5-325 MG PO TABS: 1.0000 | ORAL_TABLET | Freq: Three times a day (TID) | ORAL | 0 refills | Status: DC | PRN

## 2023-04-11 NOTE — Progress Notes (Signed)
Nursing Pain Medication Assessment:  Safety precautions to be maintained throughout the outpatient stay will include: orient to surroundings, keep bed in low position, maintain call bell within reach at all times, provide assistance with transfer out of bed and ambulation.  Medication Inspection Compliance: Pill count conducted under aseptic conditions, in front of the patient. Neither the pills nor the bottle was removed from the patient's sight at any time. Once count was completed pills were immediately returned to the patient in their original bottle.  Medication: Hydrocodone/APAP Pill/Patch Count:  8 of 90 pills remain Pill/Patch Appearance: Markings consistent with prescribed medication Bottle Appearance: Standard pharmacy container. Clearly labeled. Filled Date: 4 / 32 / 2024 Last Medication intake:  TodaySafety precautions to be maintained throughout the outpatient stay will include: orient to surroundings, keep bed in low position, maintain call bell within reach at all times, provide assistance with transfer out of bed and ambulation.

## 2023-04-11 NOTE — Progress Notes (Signed)
PROVIDER NOTE: Information contained herein reflects review and annotations entered in association with encounter. Interpretation of such information and data should be left to medically-trained personnel. Information provided to patient can be located elsewhere in the medical record under "Patient Instructions". Document created using STT-dictation technology, any transcriptional errors that may result from process are unintentional.    Patient: Shawn [name]  Service Category: E/M  Provider: Edward Jolly, MD  DOB: 1961/07/07  DOS: 04/11/2023  Referring Provider: [facility]*  MRN: [id number]  Specialty: Interventional Pain Management  PCP: [facility]  Type: Established Patient  Setting: Ambulatory outpatient    Location: Office  Delivery: Face-to-face     HPI  Shawn [name], a 62 y.o. year old male, is here today because of his Cervical radicular pain [M54.12]. Shawn [name] primary complain today is Headache (Back of head and lower back) and Neck Pain  Pertinent problems: Shawn [name] has Compression fracture of L2 (HCC); Pain in both lower legs; ALS (amyotrophic lateral sclerosis) (HCC); Closed compression fracture of body of L1 vertebra (HCC); Chronic pain syndrome; and Pain management contract signed on their pertinent problem list. Pain Assessment: Severity of   is reported as a 8 /10. Location:    / . Onset:  . Quality:  . Timing:  . Modifying factor(s):  Marland Kitchen Vitals:  height is 5\' 10"  (1.778 m) and weight is 137 lb (62.1 kg). His temperature is 98.1 F (36.7 C). His blood pressure is 118/92 (abnormal) and his pulse is 104 (abnormal). His oxygen saturation is 98%.  BMI: Estimated body mass index is 19.66 kg/m as calculated from the following:   Height as of this encounter: 5\' 10"  (1.778 m).   Weight as of this encounter: 137 lb (62.1 kg). Last encounter: 02/06/2023. Last procedure: 02/28/2023.  Reason for encounter: both, medication management and  post-procedure evaluation and assessment.   -unfortunately no benefit with right SSNB and C-ESI, patient went to ED afterwards for atypical chest pain, Cardiac work up negative, C-MRI negative for any epidural hematoma  -Having increased headaches (not positional) with some vision changes -Saw Orthopedics for his shoulder  Pharmacotherapy Assessment  Analgesic: Hydrocodone 5 mg TID prn  Monitoring: [place] PMP: PDMP reviewed during this encounter.       Pharmacotherapy: No side-effects or adverse reactions reported. Compliance: No problems identified. Effectiveness: Clinically acceptable.  Florina Ou, RN  04/11/2023  2:19 PM  Sign when Signing Visit Nursing Pain Medication Assessment:  Safety precautions to be maintained throughout the outpatient stay will include: orient to surroundings, keep bed in low position, maintain call bell within reach at all times, provide assistance with transfer out of bed and ambulation.  Medication Inspection Compliance: Pill count conducted under aseptic conditions, in front of the patient. Neither the pills nor the bottle was removed from the patient's sight at any time. Once count was completed pills were immediately returned to the patient in their original bottle.  Medication: Hydrocodone/APAP Pill/Patch Count:  8 of 90 pills remain Pill/Patch Appearance: Markings consistent with prescribed medication Bottle Appearance: Standard pharmacy container. Clearly labeled. Filled Date: 4 / 70 / 2024 Last Medication intake:  TodaySafety precautions to be maintained throughout the outpatient stay will include: orient to surroundings, keep bed in low position, maintain call bell within reach at all times, provide assistance with transfer out of bed and ambulation.     No results found for: "CBDTHCR" No results found for: "D8THCCBX" No results found for: "D9THCCBX"  UDS:  Summary  Date Value Ref Range Status  06/14/2022 Note  Final    Comment:     ==================================================================== Compliance Drug Analysis, Ur ==================================================================== Test                             Result       Flag       Units  Drug Present and Declared for Prescription Verification   Gabapentin                     PRESENT      EXPECTED   Baclofen                       PRESENT      EXPECTED  Drug Present not Declared for Prescription Verification   Carboxy-THC                    262          UNEXPECTED ng/mg creat    Carboxy-THC is a metabolite of tetrahydrocannabinol (THC). Source of    THC is most commonly herbal marijuana or marijuana-based products,    but THC is also present in a scheduled prescription medication.    Trace amounts of THC can be present in hemp and cannabidiol (CBD)    products. This test is not intended to distinguish between delta-9-    tetrahydrocannabinol, the predominant form of THC in most herbal or    marijuana-based products, and delta-8-tetrahydrocannabinol.  Drug Absent but Declared for Prescription Verification   Trazodone                      Not Detected UNEXPECTED   Acetaminophen                  Not Detected UNEXPECTED    Acetaminophen, as indicated in the declared medication list, is not    always detected even when used as directed.  ==================================================================== Test                      Result    Flag   Units      Ref Range   Creatinine              34               mg/dL      >=40 ==================================================================== Declared Medications:  The flagging and interpretation on this report are based on the  following declared medications.  Unexpected results may arise from  inaccuracies in the declared medications.   **Note: The testing scope of this panel includes these medications:   Baclofen  Gabapentin (Neurontin)  Trazodone (Desyrel)   **Note: The testing scope  of this panel does not include small to  moderate amounts of these reported medications:   Acetaminophen (Tylenol)   **Note: The testing scope of this panel does not include the  following reported medications:   Albuterol (Ventolin HFA)  Atorvastatin (Lipitor)  Fluticasone (Breo)  Montelukast (Singulair)  Riluzole  Vilanterol (Breo) ==================================================================== For clinical consultation, please call [phone]. ====================================================================       ROS  Constitutional: Denies any fever or chills Gastrointestinal: No reported hemesis, hematochezia, vomiting, or acute GI distress Musculoskeletal:  headches, neck pain, shoulder pain Neurological: No reported episodes of acute onset apraxia, aphasia, dysarthria, agnosia, amnesia, paralysis, loss of coordination, or loss  of consciousness  Medication Review  Baclofen, HYDROcodone-acetaminophen, acetaminophen, albuterol, atorvastatin, fluticasone furoate-vilanterol, gabapentin, metoCLOPramide, mirtazapine, montelukast, predniSONE, riluzole, and traZODone  History Review  Allergy: Shawn [name] is allergic to aspirin, compazine [prochlorperazine], morphine and codeine, naproxen, penicillins, and tramadol. Drug: Shawn [name]  reports no history of drug use. Alcohol:  reports that he does not currently use alcohol. Tobacco:  reports that he has never smoked. He has never used smokeless tobacco. Social: Shawn [name]  reports that he has never smoked. He has never used smokeless tobacco. He reports that he does not currently use alcohol. He reports that he does not use drugs. Medical:  has a past medical history of ALS (amyotrophic lateral sclerosis) (HCC), Asthma, Coronary artery disease, Hyperlipidemia, Renal stone, and Stroke (HCC). Surgical: Shawn [name]  has a past surgical history that includes Cystoscopy with stent placement (Left, 05/20/2015); Knee  surgery (Right, 84,96, 2003); Shoulder surgery (Left, 2010); Cardiac catheterization (2006); Cystoscopy/retrograde/ureteroscopy/stone extraction with basket (2000); Cystoscopy/ureteroscopy/holmium laser (Left, 06/11/2015); Cystoscopy w/ ureteral stent removal (Left, 06/11/2015); and IR KYPHO LUMBAR INC FX REDUCE BONE BX UNI/BIL CANNULATION INC/IMAGING (01/06/2022). Family: family history includes CVA in his mother; Cancer in his mother; Diabetes in an other family member; Heart failure in his mother; Hypertension in his mother; Other in his father.  Laboratory Chemistry Profile   Renal Lab Results  Component Value Date   BUN 18 02/28/2023   CREATININE 0.67 02/28/2023   GFRAA >60 05/20/2015   GFRNONAA >60 02/28/2023    Hepatic Lab Results  Component Value Date   AST 31 02/28/2023   ALT 28 02/28/2023   ALBUMIN 3.8 02/28/2023   ALKPHOS 87 02/28/2023   LIPASE 27 05/20/2015    Electrolytes Lab Results  Component Value Date   NA 138 02/28/2023   K 4.1 02/28/2023   CL 108 02/28/2023   CALCIUM 9.0 02/28/2023    Bone No results found for: "VD25OH", "VD125OH2TOT", "OZ3086VH8", "IO9629BM8", "25OHVITD1", "25OHVITD2", "25OHVITD3", "TESTOFREE", "TESTOSTERONE"  Inflammation (CRP: Acute Phase) (ESR: Chronic Phase) No results found for: "CRP", "ESRSEDRATE", "LATICACIDVEN"       Note: Above Lab results reviewed.  Recent Imaging Review  CT Angio Chest/Abd/Pel for Dissection W and/or Wo Contrast CLINICAL DATA:  Chest pain radiating to left arm. Symptoms began while undergoing lumbar puncture.  EXAM: CT ANGIOGRAPHY CHEST, ABDOMEN AND PELVIS  TECHNIQUE: Non-contrast CT of the chest was initially obtained.  Multidetector CT imaging through the chest, abdomen and pelvis was performed using the standard protocol during bolus administration of intravenous contrast. Multiplanar reconstructed images and MIPs were obtained and reviewed to evaluate the vascular anatomy.  RADIATION DOSE REDUCTION:  This exam was performed according to the departmental dose-optimization program which includes automated exposure control, adjustment of the mA and/or kV according to patient size and/or use of iterative reconstruction technique.  CONTRAST:  OMNIPAQUE IOHEXOL 350 MG/ML SOLN  COMPARISON:  CT angiogram chest 11/12/2021. Renal stone CT 05/20/2015  FINDINGS: CTA CHEST FINDINGS  Cardiovascular: No intramural hematoma seen on the noncontrast dataset along the thoracic aorta. Overall the thoracic aorta has a normal course and caliber. Minimal atherosclerotic calcified plaque along the proximal descending thoracic aorta. Diameter of the ascending aorta at the level of the main pulmonary artery measures 2.8 by 2.9 cm. There is some pulsation artifact along the ascending aorta. No mediastinal hematoma. Diameter of the descending thoracic aorta at the same level measures 2.2 by 2.2 cm. Diameter of the distal aortic arch measures up to 2.4 cm. Lastly, the diameter of  the ascending aorta just above the aortic valves measures 2.3 cm on coronal imaging. No dissection or aneurysm formation. The origin of the great vessels is grossly preserved. Heart is nonenlarged. No significant pericardial effusion.  Mediastinum/Nodes: Preserved thyroid gland. Slightly patulous thoracic esophagus. No specific abnormal lymph node enlargement identified in the axillary regions, hilum or mediastinum.  Lungs/Pleura: There is some dependent atelectasis. Both lower lobes greater than lingula. No consolidation, pneumothorax or effusion. Calcified right-sided lung nodule on series 7, image 89 in the lower lobe consistent with old granulomatous disease.  Musculoskeletal: Mild degenerative changes along the spine. Scattered Schmorl's node changes. There is some soft tissue gas along the upper right back along the paraspinal muscles. Is also a small amount of air in the epidural space of the thoracic  spine. Please correlate with recent procedure.  Review of the MIP images confirms the above findings.  CTA ABDOMEN AND PELVIS FINDINGS  VASCULAR  Aorta: Normal caliber aorta without aneurysm, dissection, vasculitis or significant stenosis.  Celiac: Patent without evidence of aneurysm, dissection, vasculitis or significant stenosis.  SMA: Patent without evidence of aneurysm, dissection, vasculitis or significant stenosis.  Renals: Both renal arteries are patent without evidence of aneurysm, dissection, vasculitis, fibromuscular dysplasia or significant stenosis.  IMA: Patent without evidence of aneurysm, dissection, vasculitis or significant stenosis.  Inflow: Minimal atherosclerotic changes along the right common iliac artery. No significant stenosis. Minimal plaque along the internal iliac vessels as well which is calcified.  Veins: No obvious venous abnormality within the limitations of this arterial phase study.  Review of the MIP images confirms the above findings.  NON-VASCULAR  Hepatobiliary: With the limits of the early phase of the arterial bolus, the liver is grossly preserved. The gallbladder is nondilated.  Pancreas: Mild dilatation of the pancreatic duct in the head, neck and body measuring up to 4 mm. No pancreatic atrophy or mass. Partial divisum. No obvious mass.  Spleen: Spleen is nonenlarged. Mosaic enhancement in the arterial phase as expected.  Adrenals/Urinary Tract: Adrenal glands are unremarkable. Kidneys are normal, without renal calculi, focal lesion, or hydronephrosis. Bladder is unremarkable.  Stomach/Bowel: G-tube in the nondilated stomach. On this non oral contrast exam, bowel overall is nondilated. There is moderate left-sided colonic stool. Significant stool in the rectum. Normal appendix in the right lower quadrant.  Lymphatic: No abnormal lymph node enlargement identified in the abdomen and pelvis.  Reproductive: Prostate is  unremarkable.  Other: Small fat containing right inguinal hernia. No free air or free fluid.  Musculoskeletal: Augmentation cement within the compressed L2 vertebral body. There is minimal compression of L1 with the margins are sclerotic. Please correlate with history. Mild degenerative changes are identified along the spine and pelvis.  Review of the MIP images confirms the above findings.  IMPRESSION: Minimal atherosclerotic plaque. No dissection or aneurysm formation.  Dependent lung base atelectasis.  No pneumothorax or effusion.  No bowel obstruction, free air or free fluid. G-tube in place. Moderate stool.  Small amount of air in the epidural space in the upper thoracic spine as well as some air in the paraspinal muscular regions. Please correlate with patient's procedure history.  Mildly dilated pancreatic duct. Similar to previous examination. No parenchymal atrophy or obvious mass.  Electronically Signed   By: Karen Kays M.D.   On: 02/28/2023 14:54 MR CERVICAL SPINE WO CONTRAST CLINICAL DATA:  cervical epidural intraprcedural severe neck pain, back pain.  EXAM: MRI CERVICAL SPINE WITHOUT CONTRAST  TECHNIQUE: Multiplanar, multisequence MR imaging of the  cervical spine was performed. No intravenous contrast was administered.  COMPARISON:  CT cervical spine 04/03/2022.  FINDINGS: Alignment: No listhesis. Mild degenerative reversal of the normal cervical lordosis.  Vertebrae: Normal vertebral body heights. Modic type 2 degenerative endplate marrow signal changes at C5-6.  Cord: Normal spinal cord signal.  No epidural hematoma.  Posterior Fossa, vertebral arteries, paraspinal tissues: Unremarkable.  Disc levels:  C2-C3:  Unremarkable.  C3-C4: Disc bulge results in mild spinal canal stenosis with mild deformity of the ventral cord. Left-greater-than-right facet arthropathy and uncovertebral joint spurring results in moderate left neural foraminal  narrowing.  C4-C5: Central disc protrusion results deformity of the ventral cord. No significant neural foraminal narrowing.  C5-C6: Central disc-osteophyte complex results in mild spinal canal stenosis with mild deformity of the ventral cord. Right-greater-than-left facet arthropathy and uncovertebral joint spurring results in moderate right neural foraminal narrowing.  C6-C7:  Unremarkable.  C7-T1:  Normal.  IMPRESSION: 1. No evidence of epidural hematoma or spinal cord signal abnormality. 2. Multilevel cervical spondylosis resulting in deformity of the ventral cord at C3-4, C4-5 and C5-6. 3. Moderate neural foraminal narrowing on the left at C3-4 and on the right at C5-6.  Electronically Signed   By: Orvan Falconer M.D.   On: 02/28/2023 14:37 DG Chest Port 1 View CLINICAL DATA:  Chest pain  EXAM: PORTABLE CHEST 1 VIEW  COMPARISON:  11/12/2021  FINDINGS: No consolidation, pneumothorax or effusion. No edema. Slight linear opacity lung bases. Atelectasis is favored. Recommend follow-up. Apical pleural thickening. Overlapping cardiac leads. Stable cardiopericardial silhouette.  IMPRESSION: Mild linear opacity lung bases. Atelectasis is favored but recommend follow-up.  Electronically Signed   By: Karen Kays M.D.   On: 02/28/2023 13:26 DG PAIN CLINIC C-ARM 1-60 MIN NO REPORT Fluoro was used, but no Radiologist interpretation will be provided.  Please refer to "NOTES" tab for provider progress note. Note: Reviewed        Physical Exam  General appearance: Well nourished, well developed, and well hydrated. In no apparent acute distress Mental status: Alert, oriented x 3 (person, place, & time)       Respiratory: No evidence of acute respiratory distress Eyes: PERLA Vitals: BP (!) 118/92   Pulse (!) 104   Temp 98.1 F (36.7 C)   Ht 5\' 10"  (1.778 m)   Wt 137 lb (62.1 kg)   SpO2 98%   BMI 19.66 kg/m  BMI: Estimated body mass index is 19.66 kg/m as  calculated from the following:   Height as of this encounter: 5\' 10"  (1.778 m).   Weight as of this encounter: 137 lb (62.1 kg). Ideal: Ideal body weight: 73 kg (160 lb 15 oz)  Right cervical spine pain with parasthesias of right hand Right shoulder pain with shoulder abduction   Lumbar Spine Area Exam  Skin & Axial Inspection: Well healed scar from previous spine surgery detected Alignment: Symmetrical Functional ROM: Decreased ROM       Stability: No instability detected Muscle Tone/Strength: Increased muscle tone over affected area Sensory (Neurological): Musculoskeletal pain pattern   Gait & Posture Assessment  Ambulation: Patient came in today with a walker Gait: Significantly limited. Dependent on assistive device to ambulate Posture: Difficulty standing up straight, due to pain   Lower Extremity Exam      Side: Right lower extremity   Side: Left lower extremity  Stability: No instability observed           Stability: No instability observed  Skin & Extremity Inspection: Skin color, temperature, and hair growth are WNL. No peripheral edema or cyanosis. No masses, redness, swelling, asymmetry, or associated skin lesions. No contractures.   Skin & Extremity Inspection: Skin color, temperature, and hair growth are WNL. No peripheral edema or cyanosis. No masses, redness, swelling, asymmetry, or associated skin lesions. No contractures.  Functional ROM: Pain restricted ROM for all joints of the lower extremity specifically right hip           Functional ROM: Pain restricted ROM for all joints of the lower extremity          Muscle Tone/Strength: Generalized lower extremity weakness   Muscle Tone/Strength: Generalized lower extremity weakness  Sensory (Neurological): Neurogenic pain pattern         Sensory (Neurological): Neurogenic pain pattern        DTR: Patellar: deferred today Achilles: deferred today Plantar: deferred today   DTR: Patellar: deferred today Achilles:  deferred today Plantar: deferred today  Palpation: No palpable anomalies   Palpation: No palpable anomalies    4- out of 5 strength bilateral lower extremity: Plantar flexion, dorsiflexion, knee flexion, knee extension.    Assessment   Diagnosis Status  1. Cervical radicular pain   2. Foraminal stenosis of cervical region   3. Chronic right shoulder pain   4. Chest pain, unspecified type   5. Chronic pain syndrome    Persistent Persistent Persistent   Updated Problems: No problems updated.  Plan of Care  Problem-specific:  No problem-specific Assessment & Plan notes found for this encounter.  Shawn [name] has a current medication list which includes the following long-term medication(s): atorvastatin, gabapentin, metoclopramide, mirtazapine, and montelukast.  Pharmacotherapy (Medications Ordered): Meds ordered this encounter  Medications   HYDROcodone-acetaminophen (NORCO/VICODIN) 5-325 MG tablet    Sig: Take 1 tablet by mouth every 8 (eight) hours as needed for severe pain. Must last 30 days.    Dispense:  90 tablet    Refill:  0    Chronic Pain: STOP Act (Not applicable) Fill 1 day early if closed on refill date. Avoid benzodiazepines within 8 hours of opioids   predniSONE (DELTASONE) 20 MG tablet    Sig: Take 3 tablets (60 mg total) by mouth daily with breakfast for 3 days, THEN 2 tablets (40 mg total) daily with breakfast for 3 days, THEN 1 tablet (20 mg total) daily with breakfast for 3 days.    Dispense:  18 tablet    Refill:  0   HYDROcodone-acetaminophen (NORCO/VICODIN) 5-325 MG tablet    Sig: Take 1 tablet by mouth every 8 (eight) hours as needed for severe pain. Must last 30 days.    Dispense:  90 tablet    Refill:  0    Chronic Pain: STOP Act (Not applicable) Fill 1 day early if closed on refill date. Avoid benzodiazepines within 8 hours of opioids   HYDROcodone-acetaminophen (NORCO/VICODIN) 5-325 MG tablet    Sig: Take 1 tablet by mouth every 8  (eight) hours as needed for severe pain. Must last 30 days.    Dispense:  90 tablet    Refill:  0    Chronic Pain: STOP Act (Not applicable) Fill 1 day early if closed on refill date. Avoid benzodiazepines within 8 hours of opioids   Orders:  No orders of the defined types were placed in this encounter.  Follow-up plan:   Return in about 3 months (around 07/12/2023) for Medication Management, in person.  Right cervical epidural steroid injection, right suprascapular nerve block 02/28/23     Recent Visits Date Type Provider Dept  02/28/23 Procedure visit Edward Jolly, MD [facility]  02/06/23 Office Visit Edward Jolly, MD [facility]  Showing recent visits within past 90 days and meeting all other requirements Today's Visits Date Type Provider Dept  04/11/23 Office Visit Edward Jolly, MD [facility]  Showing today's visits and meeting all other requirements Future Appointments No visits were found meeting these conditions. Showing future appointments within next 90 days and meeting all other requirements  I discussed the assessment and treatment plan with the patient. The patient was provided an opportunity to ask questions and all were answered. The patient agreed with the plan and demonstrated an understanding of the instructions.  Patient advised to call back or seek an in-person evaluation if the symptoms or condition worsens.  Duration of encounter: 30 minutes.  Total time on encounter, as per AMA guidelines included both the face-to-face and non-face-to-face time personally spent by the physician and/or other qualified health care professional(s) on the day of the encounter (includes time in activities that require the physician or other qualified health care professional and does not include time in activities normally performed by clinical staff). Physician's time may include the following activities when performed: Preparing to see the  patient (e.g., pre-charting review of records, searching for previously ordered imaging, lab work, and nerve conduction tests) Review of prior analgesic pharmacotherapies. Reviewing PMP Interpreting ordered tests (e.g., lab work, imaging, nerve conduction tests) Performing post-procedure evaluations, including interpretation of diagnostic procedures Obtaining and/or reviewing separately obtained history Performing a medically appropriate examination and/or evaluation Counseling and educating the patient/family/caregiver Ordering medications, tests, or procedures Referring and communicating with other health care professionals (when not separately reported) Documenting clinical information in the electronic or other health record Independently interpreting results (not separately reported) and communicating results to the patient/ family/caregiver Care coordination (not separately reported)  Note by: Edward Jolly, MD Date: 04/11/2023; Time: 2:34 PM

## 2023-05-19 ENCOUNTER — Emergency Department: Payer: Medicare Other

## 2023-05-19 ENCOUNTER — Other Ambulatory Visit: Payer: Self-pay

## 2023-05-19 DIAGNOSIS — R531 Weakness: Secondary | ICD-10-CM | POA: Diagnosis not present

## 2023-05-19 DIAGNOSIS — R0789 Other chest pain: Secondary | ICD-10-CM | POA: Insufficient documentation

## 2023-05-19 DIAGNOSIS — G1221 Amyotrophic lateral sclerosis: Secondary | ICD-10-CM | POA: Diagnosis not present

## 2023-05-19 DIAGNOSIS — R079 Chest pain, unspecified: Secondary | ICD-10-CM | POA: Diagnosis present

## 2023-05-19 LAB — CBC
HCT: 44.8 % (ref 39.0–52.0)
Hemoglobin: 15 g/dL (ref 13.0–17.0)
MCH: 33.4 pg (ref 26.0–34.0)
MCHC: 33.5 g/dL (ref 30.0–36.0)
MCV: 99.8 fL (ref 80.0–100.0)
Platelets: 222 10*3/uL (ref 150–400)
RBC: 4.49 MIL/uL (ref 4.22–5.81)
RDW: 12.8 % (ref 11.5–15.5)
WBC: 4.7 10*3/uL (ref 4.0–10.5)
nRBC: 0 % (ref 0.0–0.2)

## 2023-05-19 LAB — CBG MONITORING, ED: Glucose-Capillary: 104 mg/dL — ABNORMAL HIGH (ref 70–99)

## 2023-05-19 NOTE — ED Notes (Signed)
Pt reports feeling increasingly dizzy during triage, vitals reassessed, vitals stable.

## 2023-05-19 NOTE — ED Triage Notes (Signed)
Pt sister present during triage, says the patient has been weaker than normal lately. Tonight, he had vomiting x2, got up, was dizzy and passed out. Unsure if he hit his head. Left sided chest pain. Bleeding around his G tube. Denies blood thinners. Hx of ALS.

## 2023-05-20 ENCOUNTER — Emergency Department: Payer: Medicare Other

## 2023-05-20 ENCOUNTER — Emergency Department
Admission: EM | Admit: 2023-05-20 | Discharge: 2023-05-20 | Disposition: A | Discharge status: Home / Self Care | Payer: Medicare Other | Attending: Emergency Medicine | Admitting: Emergency Medicine

## 2023-05-20 DIAGNOSIS — R531 Weakness: Secondary | ICD-10-CM

## 2023-05-20 DIAGNOSIS — R0789 Other chest pain: Secondary | ICD-10-CM | POA: Diagnosis not present

## 2023-05-20 DIAGNOSIS — G1221 Amyotrophic lateral sclerosis: Secondary | ICD-10-CM

## 2023-05-20 LAB — BASIC METABOLIC PANEL
Anion gap: 9 (ref 5–15)
BUN: 21 mg/dL (ref 8–23)
CO2: 27 mmol/L (ref 22–32)
Calcium: 9.8 mg/dL (ref 8.9–10.3)
Chloride: 102 mmol/L (ref 98–111)
Creatinine, Ser: 0.56 mg/dL — ABNORMAL LOW (ref 0.61–1.24)
GFR, Estimated: >60 mL/min (ref >60)
Glucose, Bld: 109 mg/dL — ABNORMAL HIGH (ref 70–99)
Potassium: 4.4 mmol/L (ref 3.5–5.1)
Sodium: 138 mmol/L (ref 135–145)

## 2023-05-20 LAB — TROPONIN I (HIGH SENSITIVITY)
Troponin I (High Sensitivity): 3 ng/L (ref <18)
Troponin I (High Sensitivity): 3 ng/L (ref <18)

## 2023-05-20 LAB — LIPASE, BLOOD: Lipase: 49 U/L (ref 11–51)

## 2023-05-20 MED ORDER — SODIUM CHLORIDE 0.9 % IV BOLUS
500.0000 mL | Freq: Once | INTRAVENOUS | Status: AC
  Administered 2023-05-20: 500 mL via INTRAVENOUS

## 2023-05-20 MED ORDER — FENTANYL CITRATE PF 50 MCG/ML IJ SOSY
50.0000 ug | PREFILLED_SYRINGE | Freq: Once | INTRAMUSCULAR | Status: AC
  Administered 2023-05-20: 50 ug via INTRAVENOUS
  Filled 2023-05-20: qty 1

## 2023-05-20 NOTE — ED Provider Notes (Signed)
[facility] Provider Note    Event Date/Time   First MD Initiated Contact with Patient 05/20/23 0159     (approximate)   History   Chest Pain   HPI Shawn [name] is a 62 y.o. male whose medical history notably includes ALS with chronic dysarthria and a feeding tube.  He presents for evaluation after passing out at home.  He states that he has been feeling weaker than usual over the last 4 days and then he threw up twice tonight, stood up, became lightheaded, and then passed out.  Unsure if he hit his head but he said that his neck is hurting.  He has also had some pain in the left side of his chest.  Unclear if this was happening before or only after the fall.  He reports a little bit of bleeding around his G-tube.  He lives independently and walks with a walker but gets a lot of assistance from his sister.  No new numbness or weakness.  No current abdominal pain or vomiting and not actively having chest pain at this time.     Physical Exam   Triage Vital Signs: ED Triage Vitals  Encounter Vitals Group     BP 05/19/23 2329 125/86     Systolic BP Percentile --      Diastolic BP Percentile --      Pulse Rate 05/19/23 2329 87     Resp 05/19/23 2329 16     Temp 05/19/23 2329 98.5 F (36.9 C)     Temp Source 05/19/23 2329 Oral     SpO2 05/19/23 2329 95 %     Weight --      Height --      Head Circumference --      Peak Flow --      Pain Score 05/19/23 2328 10     Pain Loc --      Pain Education --      Exclude from Growth Chart --     Most recent vital signs: Vitals:   05/20/23 0330 05/20/23 0415  BP: 120/82   Pulse: 75   Resp: 17   Temp:  98.9 F (37.2 C)  SpO2: 94%     General: Awake, chronically debilitated and cachectic. CV:  Good peripheral perfusion.  Regular rate and rhythm. Resp:  Normal effort. no accessory muscle usage nor intercostal retractions.  Patient speaks in "bursts" because he seems to have to take in a deep breath and  then force the words out, so communication is a bit difficult.  However his lungs are clear to auscultation and he is not having any dyspnea. Abd:  No distention.  Cachectic.  G-tube in place, appears stable, no tenderness to palpation of the abdomen. Other:  Tenderness to palpation along the cervical spine.  No visible trauma to head or to his extremities.   ED Results / Procedures / Treatments   Labs (all labs ordered are listed, but only abnormal results are displayed) Labs Reviewed  BASIC METABOLIC PANEL - Abnormal; Notable for the following components:      Result Value   Glucose, Bld 109 (*)    Creatinine, Ser 0.56 (*)    All other components within normal limits  CBG MONITORING, ED - Abnormal; Notable for the following components:   Glucose-Capillary 104 (*)    All other components within normal limits  CBC  LIPASE, BLOOD  TROPONIN I (HIGH SENSITIVITY)  TROPONIN I (HIGH SENSITIVITY)  EKG  ED ECG REPORT I, Loleta Rose, the attending physician, personally viewed and interpreted this ECG.  Date: 05/19/2023 EKG Time: 23: 29 Rate: 86 Rhythm: normal sinus rhythm QRS Axis: normal Intervals: normal ST/T Wave abnormalities: normal Narrative Interpretation: no evidence of acute ischemia    RADIOLOGY I viewed and interpreted the patient's chest x-ray.  I see no evidence of any acute or emergent abnormalities.  I also read the radiologist's report, which confirmed no acute findings.  I also viewed and interpreted the patient's CT head and CT cervical spine.  No indication of fracture, intracranial bleed, nor cervical spine injury.  PROCEDURES:  Critical Care performed: No  .1-3 Lead EKG Interpretation  Performed by: Loleta Rose, MD Authorized by: Loleta Rose, MD     Interpretation: normal     ECG rate:  72   ECG rate assessment: normal     Rhythm: sinus rhythm     Ectopy: none     Conduction: normal       IMPRESSION / MDM / ASSESSMENT AND PLAN / ED  COURSE  I reviewed the triage vital signs and the nursing notes.                              Differential diagnosis includes, but is not limited to, general debilitation secondary to ALS, volume depletion, orthostatic syncope, ACS, PE, fracture or other injury as result of fall.  Patient's presentation is most consistent with acute presentation with potential threat to life or bodily function.  Labs/studies ordered: CT head, CT cervical spine, two-view chest x-ray, CBC, BMP, lipase, high-sensitivity troponin x 2, urinalysis  Interventions/Medications given:  Medications  fentaNYL (SUBLIMAZE) injection 50 mcg (50 mcg Intravenous Given 05/20/23 0411)  sodium chloride 0.9 % bolus 500 mL (0 mLs Intravenous Stopped 05/20/23 0510)    (Note:  hospital course my include additional interventions and/or labs/studies not listed above.)   No ischemia on EKG, vital signs stable, chest x-ray normal, labs are all essentially normal other than low creatinine which is likely due to his decreased muscle mass.  Will obtain urinalysis to look for the possibility of a UTI causing his increased weakness, but I suspect this is more of a acute on chronic issue.  The patient is on the cardiac monitor to evaluate for evidence of arrhythmia and/or significant heart rate changes.   Clinical Course as of 05/20/23 0555  Sun May 20, 2023  0507 Reassessed patient.  He says he feels better after the fluids.  I let him know that the CT of the head and CT cervical spine are normal.  He said he is ready to go home.  He does not want to check the urine.  I think that is reasonable given that he has had no symptoms.  He will follow-up with his ALS physician. [CF]    Clinical Course User Index [CF] Loleta Rose, MD     FINAL CLINICAL IMPRESSION(S) / ED DIAGNOSES   Final diagnoses:  Atypical chest pain  Generalized weakness  ALS (amyotrophic lateral sclerosis) (HCC)     Rx / DC Orders   ED Discharge Orders      None        Note:  This document was prepared using Dragon voice recognition software and may include unintentional dictation errors.   Loleta Rose, MD 05/20/23 [phone]

## 2023-05-20 NOTE — Discharge Instructions (Addendum)

## 2023-07-05 ENCOUNTER — Encounter: Payer: Self-pay | Admitting: Student in an Organized Health Care Education/Training Program

## 2023-07-05 ENCOUNTER — Ambulatory Visit
Payer: Medicare Other | Attending: Student in an Organized Health Care Education/Training Program | Admitting: Student in an Organized Health Care Education/Training Program

## 2023-07-05 VITALS — BP 117/83 | HR 77 | Temp 98.2°F | Resp 16 | Ht 70.0 in | Wt 137.0 lb

## 2023-07-05 DIAGNOSIS — G8929 Other chronic pain: Secondary | ICD-10-CM | POA: Diagnosis present

## 2023-07-05 DIAGNOSIS — M5412 Radiculopathy, cervical region: Secondary | ICD-10-CM

## 2023-07-05 DIAGNOSIS — G1221 Amyotrophic lateral sclerosis: Secondary | ICD-10-CM

## 2023-07-05 DIAGNOSIS — G894 Chronic pain syndrome: Secondary | ICD-10-CM | POA: Diagnosis present

## 2023-07-05 DIAGNOSIS — M4802 Spinal stenosis, cervical region: Secondary | ICD-10-CM | POA: Diagnosis present

## 2023-07-05 DIAGNOSIS — R079 Chest pain, unspecified: Secondary | ICD-10-CM

## 2023-07-05 DIAGNOSIS — M25511 Pain in right shoulder: Secondary | ICD-10-CM | POA: Diagnosis present

## 2023-07-05 MED ORDER — HYDROCODONE-ACETAMINOPHEN 5-325 MG PO TABS: 1.0000 | ORAL_TABLET | Freq: Three times a day (TID) | ORAL | 0 refills | Status: DC | PRN

## 2023-07-05 MED ORDER — HYDROCODONE-ACETAMINOPHEN 5-325 MG PO TABS: 1.0000 | ORAL_TABLET | Freq: Three times a day (TID) | ORAL | 0 refills | Status: AC | PRN

## 2023-07-05 NOTE — Progress Notes (Signed)
PROVIDER NOTE: Information contained herein reflects review and annotations entered in association with encounter. Interpretation of such information and data should be left to medically-trained personnel. Information provided to patient can be located elsewhere in the medical record under "Patient Instructions". Document created using STT-dictation technology, any transcriptional errors that may result from process are unintentional.    Patient: Shawn [name]  Service Category: E/M  Provider: Edward Jolly, MD  DOB: [birth year]  DOS: 07/05/2023  Referring Provider: [facility]*  MRN: [id number]  Specialty: Interventional Pain Management  PCP: [facility]  Type: Established Patient  Setting: Ambulatory outpatient    Location: Office  Delivery: Face-to-face     HPI  Mr. GREGEORY DIANGELO, a 62 y.o. year old male, is here today because of his Chronic pain syndrome [G89.4]. Mr. Berling primary complain today is Back Pain  Pertinent problems: Mr. Seppala has Compression fracture of L2 (HCC); Pain in both lower legs; ALS (amyotrophic lateral sclerosis) (HCC); Closed compression fracture of body of L1 vertebra (HCC); Chronic pain syndrome; and Pain management contract signed on their pertinent problem list. Pain Assessment: Severity of Chronic pain is reported as a 8 /10. Location: Back Right, Left/from lower back to shoulders bilat and down legs bilat to toes. Onset: More than a month ago. Quality: Sharp, Constant. Timing: Constant. Modifying factor(s): meds. Vitals:  height is 5\' 10"  (1.778 m) and weight is 137 lb (62.1 kg). His temperature is 98.2 F (36.8 C). His blood pressure is 117/83 and his pulse is 77. His respiration is 16 and oxygen saturation is 95%.  BMI: Estimated body mass index is 19.66 kg/m as calculated from the following:   Height as of this encounter: 5\' 10"  (1.778 m).   Weight as of this encounter: 137 lb (62.1 kg). Last encounter: 02/06/2023. Last  procedure: 02/28/2023.  Reason for encounter: medication management.   No change in medical history since last visit.  Patient's pain is at baseline.  Patient continues multimodal pain regimen as prescribed.  States that it provides pain relief and improvement in functional status.   Pharmacotherapy Assessment  Analgesic: Hydrocodone 5 mg TID prn  Monitoring: [place] PMP: PDMP reviewed during this encounter.       Pharmacotherapy: No side-effects or adverse reactions reported. Compliance: No problems identified. Effectiveness: Clinically acceptable.  Nonah Mattes, RN  07/05/2023 10:48 AM  Sign when Signing Visit Nursing Pain Medication Assessment:  Safety precautions to be maintained throughout the outpatient stay will include: orient to surroundings, keep bed in low position, maintain call bell within reach at all times, provide assistance with transfer out of bed and ambulation.  Medication Inspection Compliance: Pill count conducted under aseptic conditions, in front of the patient. Neither the pills nor the bottle was removed from the patient's sight at any time. Once count was completed pills were immediately returned to the patient in their original bottle.  Medication: Hydrocodone/APAP Pill/Patch Count:  6 of 90 pills remain Pill/Patch Appearance: Markings consistent with prescribed medication Bottle Appearance: Standard pharmacy container. Clearly labeled. Filled Date: 8 / 10 / 2024 Last Medication intake:  Yesterday  Pt also presented with bottled filled 06/11/23 that has not been used yet.       No results found for: "CBDTHCR" No results found for: "D8THCCBX" No results found for: "D9THCCBX"  UDS:  Summary  Date Value Ref Range Status  06/14/2022 Note  Final    Comment:    ==================================================================== Compliance Drug Analysis, Ur ==================================================================== Test  Result       Flag       Units  Drug Present and Declared for Prescription Verification   Gabapentin                     PRESENT      EXPECTED   Baclofen                       PRESENT      EXPECTED  Drug Present not Declared for Prescription Verification   Carboxy-THC                    262          UNEXPECTED ng/mg creat    Carboxy-THC is a metabolite of tetrahydrocannabinol (THC). Source of    THC is most commonly herbal marijuana or marijuana-based products,    but THC is also present in a scheduled prescription medication.    Trace amounts of THC can be present in hemp and cannabidiol (CBD)    products. This test is not intended to distinguish between delta-9-    tetrahydrocannabinol, the predominant form of THC in most herbal or    marijuana-based products, and delta-8-tetrahydrocannabinol.  Drug Absent but Declared for Prescription Verification   Trazodone                      Not Detected UNEXPECTED   Acetaminophen                  Not Detected UNEXPECTED    Acetaminophen, as indicated in the declared medication list, is not    always detected even when used as directed.  ==================================================================== Test                      Result    Flag   Units      Ref Range   Creatinine              34               mg/dL      >=16 ==================================================================== Declared Medications:  The flagging and interpretation on this report are based on the  following declared medications.  Unexpected results may arise from  inaccuracies in the declared medications.   **Note: The testing scope of this panel includes these medications:   Baclofen  Gabapentin (Neurontin)  Trazodone (Desyrel)   **Note: The testing scope of this panel does not include small to  moderate amounts of these reported medications:   Acetaminophen (Tylenol)   **Note: The testing scope of this panel does not include the  following reported  medications:   Albuterol (Ventolin HFA)  Atorvastatin (Lipitor)  Fluticasone (Breo)  Montelukast (Singulair)  Riluzole  Vilanterol (Breo) ==================================================================== For clinical consultation, please call [phone]. ====================================================================       ROS  Constitutional: Denies any fever or chills Gastrointestinal: No reported hemesis, hematochezia, vomiting, or acute GI distress Musculoskeletal:  headches, neck pain, shoulder pain Neurological: No reported episodes of acute onset apraxia, aphasia, dysarthria, agnosia, amnesia, paralysis, loss of coordination, or loss of consciousness  Medication Review  Baclofen, HYDROcodone-acetaminophen, acetaminophen, albuterol, atorvastatin, fluticasone furoate-vilanterol, gabapentin, metoCLOPramide, mirtazapine, montelukast, riluzole, and traZODone  History Review  Allergy: Mr. Sobczak is allergic to aspirin, compazine [prochlorperazine], morphine and codeine, naproxen, penicillins, and tramadol. Drug: Mr. Diemert  reports no history of drug use. Alcohol:  reports that he does not  currently use alcohol. Tobacco:  reports that he has never smoked. He has never used smokeless tobacco. Social: Mr. Score  reports that he has never smoked. He has never used smokeless tobacco. He reports that he does not currently use alcohol. He reports that he does not use drugs. Medical:  has a past medical history of ALS (amyotrophic lateral sclerosis) (HCC), Asthma, Coronary artery disease, Hyperlipidemia, Renal stone, and Stroke (HCC). Surgical: Mr. Rister  has a past surgical history that includes Cystoscopy with stent placement (Left, 05/20/2015); Knee surgery (Right, 84,96, 2003); Shoulder surgery (Left, 2010); Cardiac catheterization (2006); Cystoscopy/retrograde/ureteroscopy/stone extraction with basket (2000); Cystoscopy/ureteroscopy/holmium laser (Left, 06/11/2015);  Cystoscopy w/ ureteral stent removal (Left, 06/11/2015); and IR KYPHO LUMBAR INC FX REDUCE BONE BX UNI/BIL CANNULATION INC/IMAGING (01/06/2022). Family: family history includes CVA in his mother; Cancer in his mother; Diabetes in an other family member; Heart failure in his mother; Hypertension in his mother; Other in his father.  Laboratory Chemistry Profile   Renal Lab Results  Component Value Date   BUN 21 05/19/2023   CREATININE 0.56 (L) 05/19/2023   GFRAA >60 05/20/2015   GFRNONAA >60 05/19/2023    Hepatic Lab Results  Component Value Date   AST 31 02/28/2023   ALT 28 02/28/2023   ALBUMIN 3.8 02/28/2023   ALKPHOS 87 02/28/2023   LIPASE 49 05/19/2023    Electrolytes Lab Results  Component Value Date   NA 138 05/19/2023   K 4.4 05/19/2023   CL 102 05/19/2023   CALCIUM 9.8 05/19/2023    Bone No results found for: "VD25OH", "VD125OH2TOT", "WU9811BJ4", "NW2956OZ3", "25OHVITD1", "25OHVITD2", "25OHVITD3", "TESTOFREE", "TESTOSTERONE"  Inflammation (CRP: Acute Phase) (ESR: Chronic Phase) No results found for: "CRP", "ESRSEDRATE", "LATICACIDVEN"       Note: Above Lab results reviewed.  Recent Imaging Review  CT Cervical Spine Wo Contrast CLINICAL DATA:  Neck trauma, impaired range of motion. Vomiting and weakness with syncope.  EXAM: CT HEAD WITHOUT CONTRAST  CT CERVICAL SPINE WITHOUT CONTRAST  TECHNIQUE: Multidetector CT imaging of the head and cervical spine was performed following the standard protocol without intravenous contrast. Multiplanar CT image reconstructions of the cervical spine were also generated.  RADIATION DOSE REDUCTION: This exam was performed according to the departmental dose-optimization program which includes automated exposure control, adjustment of the mA and/or kV according to patient size and/or use of iterative reconstruction technique.  COMPARISON:  04/03/2022  FINDINGS: CT HEAD FINDINGS  Brain: No evidence of acute infarction,  hemorrhage, hydrocephalus, extra-axial collection or mass lesion/mass effect.  Vascular: No hyperdense vessel or unexpected calcification.  Skull: Normal. Negative for fracture or focal lesion.  Sinuses/Orbits: Negative for injury.  CT CERVICAL SPINE FINDINGS  Alignment: No traumatic malalignment  Skull base and vertebrae: No acute fracture. No primary bone lesion or focal pathologic process.  Soft tissues and spinal canal: No prevertebral fluid or swelling. No visible canal hematoma.  Disc levels: Degenerative spurring especially at the C5-6 disc space.  Upper chest: No visible injury  IMPRESSION: No evidence of intracranial or cervical spine injury.  Electronically Signed   By: Tiburcio Pea M.D.   On: 05/20/2023 04:14 CT Head Wo Contrast CLINICAL DATA:  Neck trauma, impaired range of motion. Vomiting and weakness with syncope.  EXAM: CT HEAD WITHOUT CONTRAST  CT CERVICAL SPINE WITHOUT CONTRAST  TECHNIQUE: Multidetector CT imaging of the head and cervical spine was performed following the standard protocol without intravenous contrast. Multiplanar CT image reconstructions of the cervical spine were also generated.  RADIATION  DOSE REDUCTION: This exam was performed according to the departmental dose-optimization program which includes automated exposure control, adjustment of the mA and/or kV according to patient size and/or use of iterative reconstruction technique.  COMPARISON:  04/03/2022  FINDINGS: CT HEAD FINDINGS  Brain: No evidence of acute infarction, hemorrhage, hydrocephalus, extra-axial collection or mass lesion/mass effect.  Vascular: No hyperdense vessel or unexpected calcification.  Skull: Normal. Negative for fracture or focal lesion.  Sinuses/Orbits: Negative for injury.  CT CERVICAL SPINE FINDINGS  Alignment: No traumatic malalignment  Skull base and vertebrae: No acute fracture. No primary bone lesion or focal pathologic  process.  Soft tissues and spinal canal: No prevertebral fluid or swelling. No visible canal hematoma.  Disc levels: Degenerative spurring especially at the C5-6 disc space.  Upper chest: No visible injury  IMPRESSION: No evidence of intracranial or cervical spine injury.  Electronically Signed   By: Tiburcio Pea M.D.   On: 05/20/2023 04:14 DG Chest 2 View CLINICAL DATA:  Weakness with dizziness and vomiting.  EXAM: CHEST - 2 VIEW  COMPARISON:  Feb 28, 2023  FINDINGS: The heart size and mediastinal contours are within normal limits. Low lung volumes are noted. There is no evidence of acute infiltrate, pleural effusion or pneumothorax. Evidence of prior vertebroplasty is seen within the visualized portion of the upper lumbar spine. No acute osseous abnormalities are identified.  IMPRESSION: No active cardiopulmonary disease.  Electronically Signed   By: Aram Candela M.D.   On: 05/19/2023 23:58 Note: Reviewed        Physical Exam  General appearance: Well nourished, well developed, and well hydrated. In no apparent acute distress Mental status: Alert, oriented x 3 (person, place, & time)       Respiratory: No evidence of acute respiratory distress Eyes: PERLA Vitals: BP 117/83   Pulse 77   Temp 98.2 F (36.8 C)   Resp 16   Ht 5\' 10"  (1.778 m)   Wt 137 lb (62.1 kg)   SpO2 95%   BMI 19.66 kg/m  BMI: Estimated body mass index is 19.66 kg/m as calculated from the following:   Height as of this encounter: 5\' 10"  (1.778 m).   Weight as of this encounter: 137 lb (62.1 kg). Ideal: Ideal body weight: 73 kg (160 lb 15 oz)  Right cervical spine pain with parasthesias of right hand Right shoulder pain with shoulder abduction   Lumbar Spine Area Exam  Skin & Axial Inspection: Well healed scar from previous spine surgery detected Alignment: Symmetrical Functional ROM: Decreased ROM       Stability: No instability detected Muscle Tone/Strength: Increased  muscle tone over affected area Sensory (Neurological): Musculoskeletal pain pattern   Gait & Posture Assessment  Ambulation: Patient came in today with a walker Gait: Significantly limited. Dependent on assistive device to ambulate Posture: Difficulty standing up straight, due to pain   Lower Extremity Exam      Side: Right lower extremity   Side: Left lower extremity  Stability: No instability observed           Stability: No instability observed          Skin & Extremity Inspection: Skin color, temperature, and hair growth are WNL. No peripheral edema or cyanosis. No masses, redness, swelling, asymmetry, or associated skin lesions. No contractures.   Skin & Extremity Inspection: Skin color, temperature, and hair growth are WNL. No peripheral edema or cyanosis. No masses, redness, swelling, asymmetry, or associated skin lesions. No contractures.  Functional  ROM: Pain restricted ROM for all joints of the lower extremity specifically right hip           Functional ROM: Pain restricted ROM for all joints of the lower extremity          Muscle Tone/Strength: Generalized lower extremity weakness   Muscle Tone/Strength: Generalized lower extremity weakness  Sensory (Neurological): Neurogenic pain pattern         Sensory (Neurological): Neurogenic pain pattern        DTR: Patellar: deferred today Achilles: deferred today Plantar: deferred today   DTR: Patellar: deferred today Achilles: deferred today Plantar: deferred today  Palpation: No palpable anomalies   Palpation: No palpable anomalies    4- out of 5 strength bilateral lower extremity: Plantar flexion, dorsiflexion, knee flexion, knee extension.    Assessment   Diagnosis Status  1. Chronic pain syndrome   2. Cervical radicular pain   3. Foraminal stenosis of cervical region   4. Chronic right shoulder pain   5. ALS (amyotrophic lateral sclerosis) (HCC)   6. Chest pain, unspecified type    Persistent Persistent Persistent     Plan of Care  Problem-specific:  No problem-specific Assessment & Plan notes found for this encounter.  Mr. BECKET CIANCIULLI has a current medication list which includes the following long-term medication(s): atorvastatin, gabapentin, metoclopramide, mirtazapine, and montelukast.  Pharmacotherapy (Medications Ordered): Meds ordered this encounter  Medications   HYDROcodone-acetaminophen (NORCO/VICODIN) 5-325 MG tablet    Sig: Take 1 tablet by mouth every 8 (eight) hours as needed for severe pain. Must last 30 days.    Dispense:  90 tablet    Refill:  0    Chronic Pain: STOP Act (Not applicable) Fill 1 day early if closed on refill date. Avoid benzodiazepines within 8 hours of opioids   HYDROcodone-acetaminophen (NORCO/VICODIN) 5-325 MG tablet    Sig: Take 1 tablet by mouth every 8 (eight) hours as needed for severe pain. Must last 30 days.    Dispense:  90 tablet    Refill:  0    Chronic Pain: STOP Act (Not applicable) Fill 1 day early if closed on refill date. Avoid benzodiazepines within 8 hours of opioids   HYDROcodone-acetaminophen (NORCO/VICODIN) 5-325 MG tablet    Sig: Take 1 tablet by mouth every 8 (eight) hours as needed for severe pain. Must last 30 days.    Dispense:  90 tablet    Refill:  0    Chronic Pain: STOP Act (Not applicable) Fill 1 day early if closed on refill date. Avoid benzodiazepines within 8 hours of opioids   Orders:  Orders Placed This Encounter  Procedures   Compliance Drug Analysis, Ur    Volume: 30 ml(s). Minimum 3 ml of urine is needed. Document temperature of fresh sample. Indications: Long term (current) use of opiate analgesic (Z30.865) Test#: [phone] (Comprehensive Profile)    Order Specific Question:   Release to patient    Answer:   Immediate   Follow-up plan:   Return in about 4 months (around 11/05/2023) for MM, F2F.      Right cervical epidural steroid injection, right suprascapular nerve block 02/28/23     Recent Visits Date Type  Provider Dept  04/11/23 Office Visit Edward Jolly, MD [facility]  Showing recent visits within past 90 days and meeting all other requirements Today's Visits Date Type Provider Dept  07/05/23 Office Visit Edward Jolly, MD [facility]  Showing today's visits and meeting all other requirements Future  Appointments No visits were found meeting these conditions. Showing future appointments within next 90 days and meeting all other requirements  I discussed the assessment and treatment plan with the patient. The patient was provided an opportunity to ask questions and all were answered. The patient agreed with the plan and demonstrated an understanding of the instructions.  Patient advised to call back or seek an in-person evaluation if the symptoms or condition worsens.  Duration of encounter: 30 minutes.  Total time on encounter, as per AMA guidelines included both the face-to-face and non-face-to-face time personally spent by the physician and/or other qualified health care professional(s) on the day of the encounter (includes time in activities that require the physician or other qualified health care professional and does not include time in activities normally performed by clinical staff). Physician's time may include the following activities when performed: Preparing to see the patient (e.g., pre-charting review of records, searching for previously ordered imaging, lab work, and nerve conduction tests) Review of prior analgesic pharmacotherapies. Reviewing PMP Interpreting ordered tests (e.g., lab work, imaging, nerve conduction tests) Performing post-procedure evaluations, including interpretation of diagnostic procedures Obtaining and/or reviewing separately obtained history Performing a medically appropriate examination and/or evaluation Counseling and educating the patient/family/caregiver Ordering medications, tests, or procedures Referring and communicating  with other health care professionals (when not separately reported) Documenting clinical information in the electronic or other health record Independently interpreting results (not separately reported) and communicating results to the patient/ family/caregiver Care coordination (not separately reported)  Note by: Edward Jolly, MD Date: 07/05/2023; Time: 11:03 AM

## 2023-07-05 NOTE — Progress Notes (Signed)
Nursing Pain Medication Assessment:  Safety precautions to be maintained throughout the outpatient stay will include: orient to surroundings, keep bed in low position, maintain call bell within reach at all times, provide assistance with transfer out of bed and ambulation.  Medication Inspection Compliance: Pill count conducted under aseptic conditions, in front of the patient. Neither the pills nor the bottle was removed from the patient's sight at any time. Once count was completed pills were immediately returned to the patient in their original bottle.  Medication: Hydrocodone/APAP Pill/Patch Count:  6 of 90 pills remain Pill/Patch Appearance: Markings consistent with prescribed medication Bottle Appearance: Standard pharmacy container. Clearly labeled. Filled Date: 8 / 10 / 2024 Last Medication intake:  Yesterday  Pt also presented with bottled filled 06/11/23 that has not been used yet.

## 2023-07-12 LAB — COMPLIANCE DRUG ANALYSIS, UR

## 2023-09-13 ENCOUNTER — Emergency Department: Payer: Medicare Other

## 2023-09-13 ENCOUNTER — Encounter: Payer: Self-pay | Admitting: Emergency Medicine

## 2023-09-13 ENCOUNTER — Other Ambulatory Visit: Payer: Self-pay

## 2023-09-13 ENCOUNTER — Emergency Department
Admission: EM | Admit: 2023-09-13 | Discharge: 2023-09-13 | Disposition: A | Discharge status: Home / Self Care | Payer: Medicare Other | Attending: Emergency Medicine | Admitting: Emergency Medicine

## 2023-09-13 DIAGNOSIS — W1839XA Other fall on same level, initial encounter: Secondary | ICD-10-CM | POA: Diagnosis not present

## 2023-09-13 DIAGNOSIS — S39012A Strain of muscle, fascia and tendon of lower back, initial encounter: Secondary | ICD-10-CM | POA: Diagnosis not present

## 2023-09-13 DIAGNOSIS — M4856XA Collapsed vertebra, not elsewhere classified, lumbar region, initial encounter for fracture: Secondary | ICD-10-CM | POA: Insufficient documentation

## 2023-09-13 DIAGNOSIS — M25511 Pain in right shoulder: Secondary | ICD-10-CM | POA: Diagnosis not present

## 2023-09-13 DIAGNOSIS — S3992XA Unspecified injury of lower back, initial encounter: Secondary | ICD-10-CM | POA: Diagnosis present

## 2023-09-13 DIAGNOSIS — S0990XA Unspecified injury of head, initial encounter: Secondary | ICD-10-CM | POA: Insufficient documentation

## 2023-09-13 MED ORDER — OXYCODONE HCL 5 MG/5ML PO SOLN: 5.0000 mg | ORAL | 0 refills | Status: AC | PRN

## 2023-09-13 MED ORDER — HYDROCODONE-ACETAMINOPHEN 5-325 MG PO TABS: 1.0000 | ORAL_TABLET | Freq: Once | ORAL | Status: DC

## 2023-09-13 MED ORDER — OXYCODONE HCL 5 MG/5ML PO SOLN
5.0000 mg | ORAL | Status: AC
  Administered 2023-09-13: 5 mg via ORAL

## 2023-09-13 NOTE — ED Provider Triage Note (Signed)
Emergency Medicine Provider Triage Evaluation Note  Shawn [name] , a 62 y.o. male with a history of ALS was evaluated in triage.  Pt complains of mechanical trip and fall.  +HS, no LOC, no vomiting. Able to walk with walker. No anticoagulation. Reports right side chest pain and low back pain.  Review of Systems  Positive: Right chest pain, right shoulder Negative: Headache  Physical Exam  There were no vitals taken for this visit. Gen:   Awake, no distress   Resp:  Normal effort  MSK:   Moves extremities without difficulty  Other:    Medical Decision Making  Medically screening exam initiated at 1:53 PM.  Appropriate orders placed.  Shawn [name] was informed that the remainder of the evaluation will be completed by another provider, this initial triage assessment does not replace that evaluation, and the importance of remaining in the ED until their evaluation is complete.     Jackelyn Hoehn, PA-C 09/13/23 1356

## 2023-09-13 NOTE — TOC Progression Note (Signed)
Transition of Care [facility]) - Progression Note    Patient Details  Name: Shawn [name] MRN: [id number] Date of Birth: 11-13-1960  [facility] CM/SW Contact  Marlowe Sax, RN Phone Number: 09/13/2023, 3:20 PM  Clinical Narrative:     Received a call from [facility] with [facility], they are going to open the patient at [facility] and can take care of all his meds, send to local pharmacy and they will manage and please fax or call at [place] fax [phone] or call [phone]       Expected Discharge Plan and Services                                               Social Determinants of Health (SDOH) Interventions SDOH Screenings   Depression (PHQ2-9): Low Risk  (07/05/2023)  Tobacco Use: Low Risk  (09/13/2023)    Readmission Risk Interventions     No data to display

## 2023-09-13 NOTE — ED Provider Notes (Signed)
[facility] Provider Note    Event Date/Time   First MD Initiated Contact with Patient 09/13/23 1534     (approximate)   History   Chief Complaint: Fall   HPI  Shawn [name] is a 62 y.o. male with a history of ALS, chronic pain who comes to the ED complaining of fall 3 days ago causing right low back pain and right shoulder pain.  No head injury, no loss of consciousness, no blood thinner use.  Normally ambulates with a walker, which he was using at the time but became unsteady and fell.  He has continued to ambulate with a walker since then.  He is normally on hydrocodone 3 times daily, last filled August 09, 2023.  Reviewed PDMP which indicates a refill on December 7, but patient reports that at that time they were told that the medication had to be ordered and was not yet available.  He has not had any pain medication for the last 2 days.  Normal oral intake.  No vomiting or diarrhea or dizziness, no other acute complaints.        Physical Exam   Triage Vital Signs: ED Triage Vitals  Encounter Vitals Group     BP 09/13/23 1358 116/83     Systolic BP Percentile --      Diastolic BP Percentile --      Pulse Rate 09/13/23 1358 91     Resp 09/13/23 1358 14     Temp 09/13/23 1356 98.7 F (37.1 C)     Temp Source 09/13/23 1356 Oral     SpO2 09/13/23 1358 97 %     Weight 09/13/23 1356 136 lb (61.7 kg)     Height 09/13/23 1544 5\' 10"  (1.778 m)     Head Circumference --      Peak Flow --      Pain Score 09/13/23 1356 10     Pain Loc --      Pain Education --      Exclude from Growth Chart --     Most recent vital signs: Vitals:   09/13/23 1356 09/13/23 1358  BP:  116/83  Pulse:  91  Resp:  14  Temp: 98.7 F (37.1 C)   SpO2:  97%    General: Awake, no distress.  CV:  Good peripheral perfusion.  Regular rate rhythm Resp:  Normal effort.  Clear to auscultation bilaterally Abd:  No distention.  Soft nontender Other:  Right low back  tenderness.  Mild tenderness bilateral hips.  No limb shortening.  Long bones are stable, full range of motion all extremities.  Head atraumatic.  No midline spinal tenderness.   ED Results / Procedures / Treatments   Labs (all labs ordered are listed, but only abnormal results are displayed) Labs Reviewed - No data to display   EKG    RADIOLOGY X-ray pelvis interpreted by me, negative for fracture.  Radiology report reviewed.  CT head, cervical spine, lumbar spine unremarkable.  X-ray ribs right unremarkable   PROCEDURES:  Procedures   MEDICATIONS ORDERED IN ED: Medications  oxyCODONE (ROXICODONE) 5 MG/5ML solution 5 mg (5 mg Oral Given 09/13/23 1721)     IMPRESSION / MDM / ASSESSMENT AND PLAN / ED COURSE  I reviewed the triage vital signs and the nursing notes.  DDx: L-spine fracture, pelvis fracture, intracranial hemorrhage, C-spine fracture, rib fracture, soft tissue contusion  Patient's presentation is most consistent with acute presentation with potential threat to  life or bodily function.  Patient presents with multiple musculoskeletal pain complaints after a mechanical fall related to his underlying neurologic disease.  He remains at baseline, ambulatory with his walker as usual.  Needs pain control.  Reviewed PDMP, will prescribe oxycodone solution for the patient.       FINAL CLINICAL IMPRESSION(S) / ED DIAGNOSES   Final diagnoses:  Strain of lumbar region, initial encounter     Rx / DC Orders   ED Discharge Orders          Ordered    oxyCODONE (ROXICODONE) 5 MG/5ML solution  Every 4 hours PRN        09/13/23 1730             Note:  This document was prepared using Dragon voice recognition software and may include unintentional dictation errors.   Sharman Cheek, MD 09/13/23 [phone]

## 2023-09-13 NOTE — ED Triage Notes (Signed)
Pt presents for mechanical fall Monday for which he has not yet been treated. Endorses R low back pain, R shoulder pain. Not on a thinner. No LOC.  Baseline ambulation with walker after incident. H/o ALS

## 2023-10-20 ENCOUNTER — Inpatient Hospital Stay
Admission: EM | Admit: 2023-10-20 | Discharge: 2023-10-23 | DRG: 057 | Disposition: A | Discharge status: Hospice | Attending: Internal Medicine | Admitting: Internal Medicine

## 2023-10-20 ENCOUNTER — Emergency Department

## 2023-10-20 ENCOUNTER — Inpatient Hospital Stay

## 2023-10-20 DIAGNOSIS — G1221 Amyotrophic lateral sclerosis: Principal | ICD-10-CM | POA: Diagnosis present

## 2023-10-20 DIAGNOSIS — I2510 Atherosclerotic heart disease of native coronary artery without angina pectoris: Secondary | ICD-10-CM | POA: Diagnosis present

## 2023-10-20 DIAGNOSIS — E440 Moderate protein-calorie malnutrition: Secondary | ICD-10-CM | POA: Diagnosis present

## 2023-10-20 DIAGNOSIS — Z886 Allergy status to analgesic agent status: Secondary | ICD-10-CM | POA: Diagnosis not present

## 2023-10-20 DIAGNOSIS — W1830XA Fall on same level, unspecified, initial encounter: Secondary | ICD-10-CM | POA: Diagnosis present

## 2023-10-20 DIAGNOSIS — Z888 Allergy status to other drugs, medicaments and biological substances status: Secondary | ICD-10-CM | POA: Diagnosis not present

## 2023-10-20 DIAGNOSIS — Z833 Family history of diabetes mellitus: Secondary | ICD-10-CM

## 2023-10-20 DIAGNOSIS — S22000A Wedge compression fracture of unspecified thoracic vertebra, initial encounter for closed fracture: Principal | ICD-10-CM

## 2023-10-20 DIAGNOSIS — Z8673 Personal history of transient ischemic attack (TIA), and cerebral infarction without residual deficits: Secondary | ICD-10-CM | POA: Diagnosis not present

## 2023-10-20 DIAGNOSIS — Z66 Do not resuscitate: Secondary | ICD-10-CM | POA: Diagnosis present

## 2023-10-20 DIAGNOSIS — Z8616 Personal history of COVID-19: Secondary | ICD-10-CM | POA: Diagnosis not present

## 2023-10-20 DIAGNOSIS — Z823 Family history of stroke: Secondary | ICD-10-CM | POA: Diagnosis not present

## 2023-10-20 DIAGNOSIS — R0789 Other chest pain: Secondary | ICD-10-CM | POA: Diagnosis present

## 2023-10-20 DIAGNOSIS — Z681 Body mass index (BMI) 19 or less, adult: Secondary | ICD-10-CM

## 2023-10-20 DIAGNOSIS — Z880 Allergy status to penicillin: Secondary | ICD-10-CM

## 2023-10-20 DIAGNOSIS — Y92000 Kitchen of unspecified non-institutional (private) residence as  the place of occurrence of the external cause: Secondary | ICD-10-CM

## 2023-10-20 DIAGNOSIS — R5381 Other malaise: Secondary | ICD-10-CM | POA: Diagnosis present

## 2023-10-20 DIAGNOSIS — S22079A Unspecified fracture of T9-T10 vertebra, initial encounter for closed fracture: Secondary | ICD-10-CM | POA: Diagnosis present

## 2023-10-20 DIAGNOSIS — G8929 Other chronic pain: Secondary | ICD-10-CM | POA: Diagnosis present

## 2023-10-20 DIAGNOSIS — Y92009 Unspecified place in unspecified non-institutional (private) residence as the place of occurrence of the external cause: Secondary | ICD-10-CM | POA: Diagnosis not present

## 2023-10-20 DIAGNOSIS — J45909 Unspecified asthma, uncomplicated: Secondary | ICD-10-CM | POA: Diagnosis present

## 2023-10-20 DIAGNOSIS — Z87442 Personal history of urinary calculi: Secondary | ICD-10-CM | POA: Diagnosis not present

## 2023-10-20 DIAGNOSIS — R55 Syncope and collapse: Secondary | ICD-10-CM | POA: Diagnosis present

## 2023-10-20 DIAGNOSIS — Z931 Gastrostomy status: Secondary | ICD-10-CM

## 2023-10-20 DIAGNOSIS — M549 Dorsalgia, unspecified: Secondary | ICD-10-CM | POA: Diagnosis present

## 2023-10-20 DIAGNOSIS — E785 Hyperlipidemia, unspecified: Secondary | ICD-10-CM | POA: Diagnosis present

## 2023-10-20 DIAGNOSIS — Z885 Allergy status to narcotic agent status: Secondary | ICD-10-CM | POA: Diagnosis not present

## 2023-10-20 DIAGNOSIS — Z8249 Family history of ischemic heart disease and other diseases of the circulatory system: Secondary | ICD-10-CM

## 2023-10-20 DIAGNOSIS — W19XXXA Unspecified fall, initial encounter: Secondary | ICD-10-CM

## 2023-10-20 DIAGNOSIS — S0990XA Unspecified injury of head, initial encounter: Secondary | ICD-10-CM | POA: Diagnosis present

## 2023-10-20 DIAGNOSIS — Z993 Dependence on wheelchair: Secondary | ICD-10-CM

## 2023-10-20 DIAGNOSIS — I358 Other nonrheumatic aortic valve disorders: Secondary | ICD-10-CM | POA: Diagnosis not present

## 2023-10-20 DIAGNOSIS — Z79899 Other long term (current) drug therapy: Secondary | ICD-10-CM

## 2023-10-20 LAB — CBC
HCT: 40.4 % (ref 39.0–52.0)
Hemoglobin: 13.7 g/dL (ref 13.0–17.0)
MCH: 34.4 pg — ABNORMAL HIGH (ref 26.0–34.0)
MCHC: 33.9 g/dL (ref 30.0–36.0)
MCV: 101.5 fL — ABNORMAL HIGH (ref 80.0–100.0)
Platelets: 177 10*3/uL (ref 150–400)
RBC: 3.98 MIL/uL — ABNORMAL LOW (ref 4.22–5.81)
RDW: 13 % (ref 11.5–15.5)
WBC: 6.3 10*3/uL (ref 4.0–10.5)
nRBC: 0 % (ref 0.0–0.2)

## 2023-10-20 LAB — TROPONIN I (HIGH SENSITIVITY)
Troponin I (High Sensitivity): 2 ng/L (ref <18)
Troponin I (High Sensitivity): 3 ng/L (ref <18)

## 2023-10-20 LAB — URINALYSIS, ROUTINE W REFLEX MICROSCOPIC
Bilirubin Urine: NEGATIVE
Glucose, UA: NEGATIVE mg/dL
Hgb urine dipstick: NEGATIVE
Ketones, ur: NEGATIVE mg/dL
Leukocytes,Ua: NEGATIVE
Nitrite: NEGATIVE
Protein, ur: NEGATIVE mg/dL
Specific Gravity, Urine: 1.017 (ref 1.005–1.030)
pH: 7 (ref 5.0–8.0)

## 2023-10-20 LAB — COMPREHENSIVE METABOLIC PANEL
ALT: 35 U/L (ref 0–44)
AST: 28 U/L (ref 15–41)
Albumin: 3.9 g/dL (ref 3.5–5.0)
Alkaline Phosphatase: 110 U/L (ref 38–126)
Anion gap: 8 (ref 5–15)
BUN: 26 mg/dL — ABNORMAL HIGH (ref 8–23)
CO2: 25 mmol/L (ref 22–32)
Calcium: 9.3 mg/dL (ref 8.9–10.3)
Chloride: 105 mmol/L (ref 98–111)
Creatinine, Ser: 0.59 mg/dL — ABNORMAL LOW (ref 0.61–1.24)
GFR, Estimated: >60 mL/min (ref >60)
Glucose, Bld: 117 mg/dL — ABNORMAL HIGH (ref 70–99)
Potassium: 4 mmol/L (ref 3.5–5.1)
Sodium: 138 mmol/L (ref 135–145)
Total Bilirubin: 0.3 mg/dL (ref 0.0–1.2)
Total Protein: 6.7 g/dL (ref 6.5–8.1)

## 2023-10-20 LAB — GLUCOSE, CAPILLARY: Glucose-Capillary: 132 mg/dL — ABNORMAL HIGH (ref 70–99)

## 2023-10-20 LAB — CK: Total CK: 68 U/L (ref 49–397)

## 2023-10-20 MED ORDER — ENOXAPARIN SODIUM 40 MG/0.4ML IJ SOSY
40.0000 mg | PREFILLED_SYRINGE | INTRAMUSCULAR | Status: DC
  Administered 2023-10-20 – 2023-10-22 (×3): 40 mg via SUBCUTANEOUS
  Filled 2023-10-20 (×3): qty 0.4

## 2023-10-20 MED ORDER — SODIUM CHLORIDE 0.9% FLUSH
3.0000 mL | Freq: Two times a day (BID) | INTRAVENOUS | Status: DC
  Administered 2023-10-20 – 2023-10-23 (×6): 3 mL via INTRAVENOUS

## 2023-10-20 MED ORDER — ONDANSETRON HCL 4 MG/2ML IJ SOLN: 4.0000 mg | Freq: Four times a day (QID) | INTRAMUSCULAR | Status: DC | PRN

## 2023-10-20 MED ORDER — HYDROMORPHONE HCL 1 MG/ML IJ SOLN
0.5000 mg | INTRAMUSCULAR | Status: DC | PRN
  Administered 2023-10-20 – 2023-10-22 (×12): 0.5 mg via INTRAVENOUS
  Filled 2023-10-20 (×13): qty 0.5

## 2023-10-20 MED ORDER — FENTANYL CITRATE PF 50 MCG/ML IJ SOSY
25.0000 ug | PREFILLED_SYRINGE | Freq: Once | INTRAMUSCULAR | Status: AC
  Administered 2023-10-20: 25 ug via INTRAVENOUS
  Filled 2023-10-20: qty 1

## 2023-10-20 MED ORDER — ONDANSETRON HCL 4 MG PO TABS: 4.0000 mg | ORAL_TABLET | Freq: Four times a day (QID) | ORAL | Status: DC | PRN

## 2023-10-20 MED ORDER — SODIUM CHLORIDE 0.9 % IV SOLN: INTRAVENOUS | Status: AC

## 2023-10-20 MED ORDER — HYDROMORPHONE HCL 1 MG/ML IJ SOLN
0.5000 mg | INTRAMUSCULAR | Status: AC
  Administered 2023-10-20: 0.5 mg via INTRAVENOUS
  Filled 2023-10-20: qty 0.5

## 2023-10-20 MED ORDER — RILUZOLE 50 MG PO TABS: 50.0000 mg | ORAL_TABLET | Freq: Two times a day (BID) | ORAL | Status: DC

## 2023-10-20 MED ORDER — FLUTICASONE FUROATE-VILANTEROL 100-25 MCG/ACT IN AEPB
1.0000 | INHALATION_SPRAY | Freq: Every morning | RESPIRATORY_TRACT | Status: DC
  Administered 2023-10-21: 1 via RESPIRATORY_TRACT
  Filled 2023-10-20: qty 28

## 2023-10-20 MED ORDER — FENTANYL CITRATE PF 50 MCG/ML IJ SOSY
50.0000 ug | PREFILLED_SYRINGE | Freq: Once | INTRAMUSCULAR | Status: AC
  Administered 2023-10-20: 50 ug via INTRAVENOUS
  Filled 2023-10-20: qty 1

## 2023-10-20 MED ORDER — ALBUTEROL SULFATE (2.5 MG/3ML) 0.083% IN NEBU: 2.5000 mg | INHALATION_SOLUTION | Freq: Four times a day (QID) | RESPIRATORY_TRACT | Status: DC | PRN

## 2023-10-20 MED ORDER — OSMOLITE 1.2 CAL PO LIQD
1000.0000 mL | ORAL | Status: DC
  Administered 2023-10-20: 1000 mL

## 2023-10-20 MED ORDER — LIDOCAINE HCL URETHRAL/MUCOSAL 2 % EX GEL
1.0000 | Freq: Once | CUTANEOUS | Status: AC
  Administered 2023-10-20: 1 via URETHRAL

## 2023-10-20 MED ORDER — ACETAMINOPHEN 160 MG/5ML PO SOLN: 650.0000 mg | Freq: Four times a day (QID) | ORAL | Status: DC | PRN

## 2023-10-20 MED ORDER — MORPHINE SULFATE (PF) 2 MG/ML IV SOLN
2.0000 mg | INTRAVENOUS | Status: DC | PRN
  Filled 2023-10-20: qty 1

## 2023-10-20 MED ORDER — FLUTICASONE FUROATE-VILANTEROL 100-25 MCG/ACT IN AEPB
1.0000 | INHALATION_SPRAY | Freq: Every morning | RESPIRATORY_TRACT | Status: DC
Start: 2023-10-20 — End: 2023-10-20
  Filled 2023-10-20: qty 28

## 2023-10-20 NOTE — Assessment & Plan Note (Signed)
Positive syncopal versus presyncopal event at home in the setting of baseline ALS and poor functional status Unable to assess orthostatics as patient is unable to fully ambulate Will plan for formal neurocardiogenic workup given baseline comorbidities Plan for MRI and 2D echo IV fluid hydration PT OT evaluation Monitor

## 2023-10-20 NOTE — ED Notes (Signed)
Pt back from MRI 

## 2023-10-20 NOTE — ED Notes (Addendum)
Pt mother brought feeding supplement from home. Pt fed self with assistance setting up feeding tube supplies.

## 2023-10-20 NOTE — Assessment & Plan Note (Signed)
Baseline CAD Minimal active CP  Trop WNL  EKG NSR  Otherwise monitor for now

## 2023-10-20 NOTE — ED Notes (Addendum)
Pt communicating via whiteboard that he needs to use the bathroom. Attempted to stand pt up with x1 assist and pt unable to stand with help. Pt stating via whiteboard that he needs to stand in order to void. Dr. Alvester Morin at bedside and discussed with pt that possibility of a cath placement if unable to void.

## 2023-10-20 NOTE — ED Notes (Addendum)
Pt able to void upon standing with x2 assist. Pt unsteady when standing. of ouput. Pt repositioned in bed.

## 2023-10-20 NOTE — ED Provider Notes (Signed)
[facility] Provider Note    Event Date/Time   First MD Initiated Contact with Patient 10/20/23 0157     (approximate)   History   Fall  EM caveat, some limitation in communication abilities due to severity of ALS.  He communicates by writing on a white board, and also spoke with his sister who also provides history as well  HPI  Shawn [name] is a 63 y.o. male he has fairly severe ALS.  He was at home.  Today he had a fall in his kitchen.  He reports he passed out.  He is not sure why.  He laid on the floor for about an hour and then was able to call his sister.  He reports that his neck is hurting.  He does suffer some chronic back pain as well but also has pain over his right lower ribs.  No difficulty breathing.  He feels like a little bit weak in his legs like he cannot use him as well as he typically does but he does also have ALS that causes lower limb debility as well.  No new sensory changes or sensory loss.  Able to use his arms as normal.     Physical Exam   Triage Vital Signs: ED Triage Vitals  Encounter Vitals Group     BP 10/20/23 0148 109/73     Systolic BP Percentile --      Diastolic BP Percentile --      Pulse Rate 10/20/23 0148 89     Resp 10/20/23 0152 18     Temp 10/20/23 0148 97.6 F (36.4 C)     Temp Source 10/20/23 0148 Oral     SpO2 10/20/23 0148 91 %     Weight --      Height --      Head Circumference --      Peak Flow --      Pain Score --      Pain Loc --      Pain Education --      Exclude from Growth Chart --     Most recent vital signs: Vitals:   10/20/23 0152 10/20/23 0636  BP:  128/89  Pulse:  (!) 104  Resp: 18 18  Temp:  98.3 F (36.8 C)  SpO2:  94%     General: Awake, no distress.  Normocephalic atraumatic.  Placed into a cervical collar with spinal precautions, full CV:  Good peripheral perfusion.  Normal tones and rate Resp:  Normal effort.  Clear bilateral Abd:  No distention.  Soft  nontender nondistended Other:  He is able to wiggle his toes and discern touch in the lower extremities.  He does have about 3 out of 5 strength to borderline 4-5 strength in the lower extremities bilateral, is hard to know what his exact baseline is.  He does report feeling slightly increased weakness in both of his lower legs however.  He was continued in full spinal precautions at this time   ED Results / Procedures / Treatments   Labs (all labs ordered are listed, but only abnormal results are displayed) Labs Reviewed  CBC - Abnormal; Notable for the following components:      Result Value   RBC 3.98 (*)    MCV 101.5 (*)    MCH 34.4 (*)    All other components within normal limits  COMPREHENSIVE METABOLIC PANEL - Abnormal; Notable for the following components:   Glucose, Bld 117 (*)  BUN 26 (*)    Creatinine, Ser 0.59 (*)    All other components within normal limits  CK  URINALYSIS, ROUTINE W REFLEX MICROSCOPIC  TROPONIN I (HIGH SENSITIVITY)  TROPONIN I (HIGH SENSITIVITY)     EKG    interpreted by me at 205 heart rate 90 QRS 70 QTc 460, Normal sinus rhythm no evidence of acute ischemia    RADIOLOGY  DG Femur Min 2 Views Right Result Date: 10/20/2023 CLINICAL DATA:  Fall, right leg pain EXAM: RIGHT FEMUR 2 VIEWS COMPARISON:  None Available. FINDINGS: There is no evidence of fracture or other focal bone lesions. Soft tissues are unremarkable. IMPRESSION: Negative. Electronically Signed   By: Helyn Numbers M.D.   On: 10/20/2023 04:27    MRI result did not cross to [facility] but summary is "IMPRESSION: Cervical spine:  1. No acute finding. 2. 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 No significant spinal stenosis.  Thoracic spine:  1. Acute T10 compression fracture with mild height loss. 2. Remote and healed T9 and L1 compression fractures. "  PROCEDURES:  Critical Care performed: No  Procedures   MEDICATIONS ORDERED IN ED: Medications   fentaNYL (SUBLIMAZE) injection 50 mcg (50 mcg Intravenous Given 10/20/23 0210)  fentaNYL (SUBLIMAZE) injection 25 mcg (25 mcg Intravenous Given 10/20/23 0410)  HYDROmorphone (DILAUDID) injection 0.5 mg (0.5 mg Intravenous Given 10/20/23 0611)     IMPRESSION / MDM / ASSESSMENT AND PLAN / ED COURSE  I reviewed the triage vital signs and the nursing notes.                              Differential diagnosis includes, but is not limited to, potential causes for syncope such as orthostatic, vasovagal, anemia, cardiac dysrhythmia, etc.  He has no acute chest symptoms at this time other than a discomfort over his right rib cage after falling.  In today he was on the ground for about 1 hour, he is currently awake alert oriented.  He does report concerns of lower extremity weakness though no obvious acute unilateral deficit is present, he does complain also of neck pain think it is prudent to rule out potential spinal column injury.  CT imaging of the head neck chest abdomen pelvis with reconstructions of the lumbar and thoracic spine been ordered.  Regarding syncope, ECG reassuring, will maintain patient on cardiac monitoring, await lab testing etc.    Patient's presentation is most consistent with acute complicated illness / injury requiring diagnostic workup.      Clinical Course as of 10/20/23 0728  Sat Oct 20, 2023  1610 CTs read by radology: IMPRESSION:  1. No acute intracranial abnormality.  2. No acute fracture in the cervical spine.  3. No acute abnormality in the chest, abdomen, or pelvis.  4. Age indeterminate superior endplate compression fracture of T10.  5. No acute fracture in the lumbar spine.   Aortic Atherosclerosis (ICD10-I70.0).    [MQ]    Clinical Course User Index [MQ] Sharyn Creamer, MD   ----------------------------------------- 3:45 AM on 10/20/2023 ----------------------------------------- Patient reports he continues to feel just a sense of weakness in his legs  and is also now noticed pain over his right hip.  He is able to move his extremities and there is again no obvious frank deficit but somewhat difficult to delineate if a subtle deficit could be present in the setting of his ALS.  A small thoracic endplate fracture is  noted, indeterminate age.  I will order MRI of his cervical spine and thoracic spine.  If this imaging is negative I think we could reasonably exclude acute traumatic injury to the spinal column.  Patient advises no pacemaker or pain pumps.  He is had previous MRI.  Will give small additional dose of fentanyl he appears to be relaxed and resting much more comfortably at this time but now reports some pain over the right hip.  No obvious traumatic findings on exam or inspection  ----------------------------------------- 7:26 AM on 10/20/2023 ----------------------------------------- Patient accepted in admission to the hospitalist service.  Will admit due to debility including pain, debility due to new onset compression fracture of the lower thoracic spine.  His MRI imaging is reassuring with no evidence of acute spinal cord injury.  Cervical collar removed  Patient and his sister both understanding agreeable plan for admission, will trial hydromorphone for pain control at this time.  He is able to sit up with significant assistance, but not able to ambulate to his baseline due to pain and given his underlying debility as well as syncope I think it would be reasonable to admit for further workup.  Patient agreeable  FINAL CLINICAL IMPRESSION(S) / ED DIAGNOSES   Final diagnoses:  Compression fracture of body of thoracic vertebra (HCC)  Fall, initial encounter  Syncope and collapse     Rx / DC Orders   ED Discharge Orders     None        Note:  This document was prepared using Dragon voice recognition software and may include unintentional dictation errors.   Sharyn Creamer, MD 10/20/23 [phone]

## 2023-10-20 NOTE — Progress Notes (Signed)
PT Cancellation Note  Patient Details Name: Shawn [name] MRN: [id number] DOB: 1960-12-12   Cancelled Treatment:    Reason Eval/Treat Not Completed: Medical issues which prohibited therapy Patient has acute T10 fracture. MD notified. Will evaluate when cleared.   [name] 10/20/2023, 3:12 PM

## 2023-10-20 NOTE — ED Notes (Signed)
Patient transported to CT at this time. 

## 2023-10-20 NOTE — ED Notes (Signed)
Hospitalist at bedside 

## 2023-10-20 NOTE — ED Notes (Signed)
C-collar placed on patient at this time

## 2023-10-20 NOTE — ED Notes (Signed)
Ardine Eng aware of assigned bed

## 2023-10-20 NOTE — Plan of Care (Signed)
  Problem: Education: Goal: Knowledge of General Education information will improve Description: Including pain rating scale, medication(s)/side effects and non-pharmacologic comfort measures Outcome: Progressing   Problem: Clinical Measurements: Goal: Respiratory complications will improve Outcome: Progressing   Problem: Clinical Measurements: Goal: Cardiovascular complication will be avoided Outcome: Progressing   Problem: Nutrition: Goal: Adequate nutrition will be maintained Outcome: Progressing   Problem: Elimination: Goal: Will not experience complications related to bowel motility Outcome: Progressing   Problem: Elimination: Goal: Will not experience complications related to urinary retention Outcome: Progressing   Problem: Pain Managment: Goal: General experience of comfort will improve and/or be controlled Outcome: Progressing   Problem: Safety: Goal: Ability to remain free from injury will improve Outcome: Progressing

## 2023-10-20 NOTE — Progress Notes (Signed)
Follow-up thoracic MRI with acute T10 compression fracture with height loss Case discussed with on-call neurosurgeon Dr. Marveen Reeks Will formally consult in the morning

## 2023-10-20 NOTE — ED Notes (Signed)
Called dietary at this time for feeding supplement.

## 2023-10-20 NOTE — Assessment & Plan Note (Signed)
Fall Noted fall at home in setting of ALS, syncope vs. Presyncope and t10 compression fracture on imaging (? Chronic)  Pain control  Fall precautions  PT/OT evaluation  Monitor

## 2023-10-20 NOTE — Assessment & Plan Note (Signed)
Stable from a respiratory standpoint Continue inhalers

## 2023-10-20 NOTE — Assessment & Plan Note (Signed)
Baseline ALS w/ progressive generalized weakness  On Riluzole  Cont

## 2023-10-20 NOTE — ED Notes (Signed)
Pt unable to urinate in the urinal at this time. MD made aware.

## 2023-10-20 NOTE — ED Notes (Signed)
Orders placed for urinary catheter. Urinary cath placed. Pt tolerated well. Chux pad placed under pt. Peri care provided.

## 2023-10-20 NOTE — Progress Notes (Signed)
OT Cancellation Note  Patient Details Name: Shawn [name] MRN: [id number] DOB: 1961-03-07   Cancelled Treatment:    Reason Eval/Treat Not Completed: Other (comment). Pt with new acute T10 fx and per MD hold on therapy evaluations until ortho is consulted. Will re-attempt evaluations tomorrow following recommendations from ortho.  Constance Goltz 10/20/2023, 3:11 PM

## 2023-10-20 NOTE — Assessment & Plan Note (Signed)
Nutrition consult for tube feeding

## 2023-10-20 NOTE — ED Notes (Signed)
Pt c/o catheter leaking. Upon assessment pt seems to be trying to urinate around the cath. RN educated pt.

## 2023-10-20 NOTE — H&P (Addendum)
History and Physical    Patient: Shawn [name] YNW:[id number] DOB: 1961/05/22 DOA: 10/20/2023 DOS: the patient was seen and examined on 10/20/2023 PCP: [facility]  Patient coming from: Home  Chief Complaint:  Chief Complaint  Patient presents with   Fall   HPI: Shawn [name] is a 63 y.o. male with medical history significant of ALS, CAD, hyperlipidemia, history of CVA presenting with syncope versus presyncope, T12 compression fracture, fall.  Patient reports feeling dizzy at home with patient subsequently fell on the floor.  Positive head trauma per patient.  Noted baseline ALS with significant generalized weakness chronically.  PEG tube also in place.  Positive chest discomfort and weakness prior to event.  Reports remote history of similar episode roughly 6 months ago.  Does not report true passing out.  No fevers or chills.  PEG tube in place.  No reported nausea.  Positive generalized weakness numbness and worsening functional status. Presented to the ER afebrile, hemodynamically stable.  Satting well on room air.  White count 6.3, hemoglobin 13.7, platelets 177, creatinine 0.6.  CK 68.  Imaging including CT head, CT CT and L-spine grossly stable apart from T10 compression fracture which may be chronic/age-indeterminate.. Review of Systems: As mentioned in the history of present illness. All other systems reviewed and are negative. Past Medical History:  Diagnosis Date   ALS (amyotrophic lateral sclerosis) (HCC)    Asthma    Coronary artery disease    Hyperlipidemia    Renal stone    Stroke [facility])    Past Surgical History:  Procedure Laterality Date   CARDIAC CATHETERIZATION  2006   negative   CYSTOSCOPY W/ URETERAL STENT REMOVAL Left 06/11/2015   Procedure: CYSTOSCOPY WITH STENT EXCHANGE;  Surgeon: Malen Gauze, MD;  Location: [facility] ORS;  Service: Urology;  Laterality: Left;   CYSTOSCOPY WITH STENT PLACEMENT Left 05/20/2015   Procedure: CYSTOSCOPY WITH  STENT PLACEMENT;  Surgeon: Crist Fat, MD;  Location: [facility] ORS;  Service: Urology;  Laterality: Left;   CYSTOSCOPY/RETROGRADE/URETEROSCOPY/STONE EXTRACTION WITH BASKET  2000   pt. unsure of which side done   CYSTOSCOPY/URETEROSCOPY/HOLMIUM LASER Left 06/11/2015   Procedure: CYSTOSCOPY/URETEROSCOPY/HOLMIUM LASER;  Surgeon: Malen Gauze, MD;  Location: [facility] ORS;  Service: Urology;  Laterality: Left;   IR KYPHO LUMBAR INC FX REDUCE BONE BX UNI/BIL CANNULATION INC/IMAGING  01/06/2022   KNEE SURGERY Right 84,96, 2003   x3   SHOULDER SURGERY Left 2010   Social History:  reports that he has never smoked. He has never used smokeless tobacco. He reports that he does not currently use alcohol. He reports that he does not use drugs.  Allergies  Allergen Reactions   Aspirin Other (See Comments), Itching, Shortness Of Breath and Swelling    Upset stomach  GI Upset   Compazine [Prochlorperazine] Other (See Comments)    Added per pt. Pt had tachycardia chest discomfort   Morphine And Codeine Itching   Naproxen Other (See Comments)    Upset stomach  GI Upset   Penicillins Rash   Tramadol Itching    Family History  Problem Relation Age of Onset   Cancer Mother    Heart failure Mother    Hypertension Mother    CVA Mother    Diabetes Other    Other Father        unknown medical history    Prior to Admission medications   Medication Sig Start Date End Date Taking? Authorizing Provider  acetaminophen (TYLENOL) 160 MG/5ML  solution Take 480 mg by mouth every 6 (six) hours as needed for mild pain or moderate pain.    [provider]  albuterol (VENTOLIN HFA) 108 (90 Base) MCG/ACT inhaler Inhale 1-2 puffs into the lungs every 6 (six) hours as needed for wheezing or shortness of breath.    [provider]  atorvastatin (LIPITOR) 20 MG tablet Take 20 mg by mouth daily. 06/25/20   [provider]  Baclofen 5 MG TABS Take 10 mg by mouth 3 (three) times daily.     [provider]  fluticasone furoate-vilanterol (BREO ELLIPTA) 100-25 MCG/ACT AEPB Inhale 1 puff into the lungs in the morning.    [provider]  gabapentin (NEURONTIN) 250 MG/5ML solution Place 12 mLs into feeding tube 3 (three) times daily. 08/22/22   [provider]  HYDROcodone-acetaminophen (NORCO/VICODIN) 5-325 MG tablet Take 1 tablet by mouth every 8 (eight) hours as needed for severe pain. Must last 30 days. 10/08/23 11/07/23  Edward Jolly, MD  metoCLOPramide (REGLAN) 5 MG tablet Take 5 mg by mouth 2 (two) times daily. 02/28/23   [provider]  mirtazapine (REMERON) 7.5 MG tablet Take 7.5 mg by mouth at bedtime. 11/28/22   [provider]  montelukast (SINGULAIR) 10 MG tablet Take 10 mg by mouth at bedtime.    [provider]  riluzole (RILUTEK) 50 MG tablet Take 50 mg by mouth 2 (two) times daily.    [provider]  traZODone (DESYREL) 50 MG tablet Take 50 mg by mouth at bedtime. 03/29/23   [provider]    Physical Exam: Vitals:   10/20/23 0148 10/20/23 0152 10/20/23 0636  BP: 109/73  128/89  Pulse: 89  (!) 104  Resp: 18 18 18   Temp: 97.6 F (36.4 C)  98.3 F (36.8 C)  TempSrc: Oral  Oral  SpO2: 91%  94%   Physical Exam Constitutional:      Comments: Underweight  Communicates with writing on ink-board  Nonverbal    HENT:     Head: Normocephalic.     Nose: Nose normal.     Mouth/Throat:     Mouth: Mucous membranes are dry.  Eyes:     Pupils: Pupils are equal, round, and reactive to light.  Cardiovascular:     Rate and Rhythm: Normal rate and regular rhythm.  Pulmonary:     Effort: Pulmonary effort is normal.  Abdominal:     General: Bowel sounds are normal.  Musculoskeletal:     Comments: + generalized weakness and tremor   Skin:    General: Skin is dry.  Neurological:     Comments: + generalized weakness and tremor       Data Reviewed:  There are no new results to review at this  time.  Assessment and Plan: * Syncope Positive syncopal versus presyncopal event at home in the setting of baseline ALS and poor functional status Unable to assess orthostatics as patient is unable to fully ambulate Will plan for formal neurocardiogenic workup given baseline comorbidities Plan for MRI and 2D echo IV fluid hydration PT OT evaluation Monitor  Compression fracture of body of thoracic vertebra (HCC) Fall Noted fall at home in setting of ALS, syncope vs. Presyncope and t10 compression fracture on imaging (? Chronic)  Pain control  Fall precautions  PT/OT evaluation  Monitor   CAD (coronary artery disease) Baseline CAD Minimal active CP  Trop WNL  EKG NSR  Otherwise monitor for now   S/P percutaneous endoscopic  gastrostomy (PEG) tube placement [facility]) Nutrition consult for tube feeding    Asthma, chronic Stable from a respiratory standpoint Continue inhalers  ALS (amyotrophic lateral sclerosis) (HCC) Baseline ALS w/ progressive generalized weakness  On Riluzole  Cont       Advance Care Planning:   Code Status: Limited: Do not attempt resuscitation (DNR) -DNR-LIMITED -Do Not Intubate/DNI    Consults: None   Family Communication: No family at the bedside   Severity of Illness: The appropriate patient status for this patient is INPATIENT. Inpatient status is judged to be reasonable and necessary in order to provide the required intensity of service to ensure the patient's safety. The patient's presenting symptoms, physical exam findings, and initial radiographic and laboratory data in the context of their chronic comorbidities is felt to place them at high risk for further clinical deterioration. Furthermore, it is not anticipated that the patient will be medically stable for discharge from the hospital within 2 midnights of admission.   * I certify that at the point of admission it is my clinical judgment that the patient will require inpatient hospital care  spanning beyond 2 midnights from the point of admission due to high intensity of service, high risk for further deterioration and high frequency of surveillance required.*  Author: Floydene Flock, MD 10/20/2023 8:21 AM  For on call review [email or web address].

## 2023-10-20 NOTE — Plan of Care (Signed)
  Problem: Education: Goal: Knowledge of General Education information will improve Description Including pain rating scale, medication(s)/side effects and non-pharmacologic comfort measures Outcome: Progressing   

## 2023-10-20 NOTE — Progress Notes (Signed)
Brief Nutrition Note  Consult received for enteral/tube feeding initiation and management.  Pt is still in the Emergency Department at this time. Adult Enteral Nutrition Protocol initiated. Full assessment to follow.  PEG tube in place per chart review and H&P, not documented on LDA Avatar at this time.   Admitting Dx: Syncope [R55]  There is no height or weight on file to calculate BMI.  Labs:  Recent Labs  Lab 10/20/23 0151  NA 138  K 4.0  CL 105  CO2 25  BUN 26*  CREATININE 0.59*  CALCIUM 9.3  GLUCOSE 117*    Mertie Clause, MS, RD, LDN Registered Dietitian II Please see AMiON for contact information.

## 2023-10-20 NOTE — ED Notes (Signed)
Pt to MRI

## 2023-10-20 NOTE — ED Triage Notes (Addendum)
Per EMS, sister called out for a fall tonight. Patient C/O right head and neck pain and new onset weakness in the right leg. Not on blood thinners. Per patient, he lost consciousness after the fall. Hx of ALS and is non-verbal at baseline.

## 2023-10-21 ENCOUNTER — Inpatient Hospital Stay (HOSPITAL_COMMUNITY)
Admit: 2023-10-21 | Discharge: 2023-10-21 | Disposition: A | Discharge status: Home / Self Care | Attending: Family Medicine | Admitting: Family Medicine

## 2023-10-21 DIAGNOSIS — I358 Other nonrheumatic aortic valve disorders: Secondary | ICD-10-CM

## 2023-10-21 DIAGNOSIS — R55 Syncope and collapse: Secondary | ICD-10-CM

## 2023-10-21 LAB — COMPREHENSIVE METABOLIC PANEL
ALT: 26 U/L (ref 0–44)
AST: 31 U/L (ref 15–41)
Albumin: 3.6 g/dL (ref 3.5–5.0)
Alkaline Phosphatase: 109 U/L (ref 38–126)
Anion gap: 8 (ref 5–15)
BUN: 15 mg/dL (ref 8–23)
CO2: 22 mmol/L (ref 22–32)
Calcium: 8.9 mg/dL (ref 8.9–10.3)
Chloride: 109 mmol/L (ref 98–111)
Creatinine, Ser: 0.61 mg/dL (ref 0.61–1.24)
GFR, Estimated: >60 mL/min (ref >60)
Glucose, Bld: 109 mg/dL — ABNORMAL HIGH (ref 70–99)
Potassium: 4.6 mmol/L (ref 3.5–5.1)
Sodium: 139 mmol/L (ref 135–145)
Total Bilirubin: 0.9 mg/dL (ref 0.0–1.2)
Total Protein: 6.3 g/dL — ABNORMAL LOW (ref 6.5–8.1)

## 2023-10-21 LAB — CBC
HCT: 37.2 % — ABNORMAL LOW (ref 39.0–52.0)
Hemoglobin: 12.5 g/dL — ABNORMAL LOW (ref 13.0–17.0)
MCH: 34.4 pg — ABNORMAL HIGH (ref 26.0–34.0)
MCHC: 33.6 g/dL (ref 30.0–36.0)
MCV: 102.5 fL — ABNORMAL HIGH (ref 80.0–100.0)
Platelets: 179 10*3/uL (ref 150–400)
RBC: 3.63 MIL/uL — ABNORMAL LOW (ref 4.22–5.81)
RDW: 13.2 % (ref 11.5–15.5)
WBC: 6.1 10*3/uL (ref 4.0–10.5)
nRBC: 0 % (ref 0.0–0.2)

## 2023-10-21 LAB — ECHOCARDIOGRAM COMPLETE
AR max vel: 1.76 cm2
AV Peak grad: 7.2 mm[Hg]
Ao pk vel: 1.34 m/s
Area-P 1/2: 2.84 cm2
S' Lateral: 2.3 cm
Weight: 2172.85 [oz_av]

## 2023-10-21 LAB — GLUCOSE, CAPILLARY
Glucose-Capillary: 108 mg/dL — ABNORMAL HIGH (ref 70–99)
Glucose-Capillary: 109 mg/dL — ABNORMAL HIGH (ref 70–99)
Glucose-Capillary: 111 mg/dL — ABNORMAL HIGH (ref 70–99)
Glucose-Capillary: 126 mg/dL — ABNORMAL HIGH (ref 70–99)
Glucose-Capillary: 127 mg/dL — ABNORMAL HIGH (ref 70–99)
Glucose-Capillary: 98 mg/dL (ref 70–99)

## 2023-10-21 LAB — HIV ANTIBODY (ROUTINE TESTING W REFLEX): HIV Screen 4th Generation wRfx: NONREACTIVE

## 2023-10-21 MED ORDER — KATE FARMS STANDARD 1.4 PO LIQD
325.0000 mL | Freq: Four times a day (QID) | ORAL | Status: DC
  Administered 2023-10-21 – 2023-10-22 (×7): 325 mL
  Filled 2023-10-21: qty 325

## 2023-10-21 MED ORDER — CHLORHEXIDINE GLUCONATE CLOTH 2 % EX PADS
6.0000 | MEDICATED_PAD | Freq: Every day | CUTANEOUS | Status: DC
  Administered 2023-10-22: 6 via TOPICAL

## 2023-10-21 MED ORDER — TRAZODONE HCL 50 MG PO TABS
50.0000 mg | ORAL_TABLET | Freq: Every evening | ORAL | Status: DC | PRN
  Administered 2023-10-21: 50 mg via ORAL
  Filled 2023-10-21: qty 1

## 2023-10-21 MED ORDER — VITAL 1.5 CAL PO LIQD: 237.0000 mL | Freq: Every day | ORAL | Status: DC

## 2023-10-21 MED ORDER — FREE WATER
145.0000 mL | Status: DC
Start: 2023-10-21 — End: 2023-10-23
  Administered 2023-10-21 – 2023-10-23 (×13): 145 mL

## 2023-10-21 NOTE — Progress Notes (Signed)
       CROSS COVER NOTE  NAME: Shawn STOLTMAN MRN: [id number] DOB : 03-Jan-1961 ATTENDING PHYSICIAN: Lucile Shutters, MD    Date of Service   10/21/2023   HPI/Events of Note   Message received from nurse via secure chat    Interventions   Assessment/Plan:S-Admitted with compression fx of vertebra after fall at home  B- DX of ALS, NPO, non-verbal, but communicates with board. Wrote that the continuous feeding that was started makes him sick, states he administers 1 bottle four times a day at home. Has g-tube since 05/2022. Unhooked g-tube himself at 2am and asked for it to be stopped.  A-Pt is A/Ox4 able to make needs known. G-tube site clean and abdomen non tender to touch. Spoke with patient at this time after checking blood glucose and stated I would restart his feeding. Pt very concerned about his feeding and asking nutritionist to explain to him and answer questions. Blood glucose has been stable q4H, ranging from 111-132  R- This is an FYI of his wants, unsure of what can be done to change his mind until nutritionist can talk to him?  Dietician consult to discuss patient concerns placed      Donnie Mesa NP [facility] Cross Cover 7pm-7am - check amion for availability Pager [phone]

## 2023-10-21 NOTE — Progress Notes (Signed)
Orthopedic Tech Progress Note Patient Details:  Shawn [name] 1961-06-22 [id number]  Order for a TLSO has been called into [facility].  Patient ID: Shawn [name], male   DOB: Nov 08, 1960, 63 y.o.   MRN: [id number]  Docia Furl 10/21/2023, 5:56 PM

## 2023-10-21 NOTE — Progress Notes (Signed)
Called ortho tech and ordered a TLSO brace. Most likely to be delivered tomorrow.

## 2023-10-21 NOTE — Progress Notes (Signed)
  Progress Note   Patient: Shawn [name] NWG:[id number] DOB: Apr 22, 1961 DOA: 10/20/2023     1 DOS: the patient was seen and examined on 10/21/2023   Brief hospital course:   Shawn [name] is a 63 y.o. male with medical history significant of ALS, CAD, hyperlipidemia, history of CVA presenting with syncope versus presyncope, T12 compression fracture, fall.  Patient reports feeling dizzy at home with patient subsequently fell on the floor.  Positive head trauma per patient.  Noted baseline ALS with significant generalized weakness chronically.  PEG tube also in place.  Positive chest discomfort and weakness prior to event.  Reports remote history of similar episode roughly 6 months ago.  Does not report true passing out.  No fevers or chills.  PEG tube in place.  No reported nausea.  Positive generalized weakness numbness and worsening functional status. Presented to the ER afebrile, hemodynamically stable.  Satting well on room air.  White count 6.3, hemoglobin 13.7, platelets 177, creatinine 0.6.  CK 68.  Imaging including CT head, CT CT and L-spine grossly stable apart from T10 compression fracture which may be chronic/age-indeterminate..    Assessment and Plan:  * Syncope Had a syncopal episode  in the setting of baseline ALS and poor functional status Unable to assess orthostatics as patient is unable to fully ambulate MRI of the brain negative for any acute findings 2D echocardiogram pending    Acute compression fracture of body of thoracic vertebra (HCC) (T10) Fall Traumatic and following a fall Appreciate neurosurgery input Continue pain control No indication for surgical repair 2) TLSO brace 3) Standing xrays with brace 4) OK to mobilize once brace in place.     History of CVA ALS S/P percutaneous endoscopic gastrostomy (PEG) tube placement (HCC) Continue tube feeds 4 times daily Administer free water Continue Riluzole     Asthma, chronic Stable from a respiratory  standpoint Continue as needed bronchodilators           Subjective: Nonverbal.  Communicates using an ink board  Physical Exam: Vitals:   10/20/23 1955 10/21/23 0500 10/21/23 0844 10/21/23 0921  BP: 118/74  (!) 135/90   Pulse: 94  76   Resp: 18  18   Temp: 98.8 F (37.1 C)  97.6 F (36.4 C)   TempSrc: Oral  Oral   SpO2: 94%  93%   Weight:  61.6 kg  58.2 kg   HENT:     Head: Normocephalic.     Nose: Nose normal.     Mouth/Throat:     Mouth: Mucous membranes are dry.  Eyes:     Pupils: Pupils are equal, round, and reactive to light.  Cardiovascular:     Rate and Rhythm: Normal rate and regular rhythm.  Pulmonary:     Effort: Pulmonary effort is normal.  Abdominal:     General: Bowel sounds are normal.  Musculoskeletal:     Comments: + generalized weakness and tremor   Skin:    General: Skin is dry.  Neurological:     Comments: + generalized weakness and tremor    Data Reviewed: Labs reviewed There are no new results to review at this time.  Family Communication: Plan of care discussed with patient  Disposition: Status is: Inpatient Remains inpatient appropriate because: Pain control  Planned Discharge Destination:  TBD    Time spent: 35  minutes  Author: Lucile Shutters, MD 10/21/2023 12:43 PM  For on call review [email or web address].

## 2023-10-21 NOTE — Consult Note (Signed)
Referring Physician:  No referring provider defined for this encounter.  Primary Physician:  [facility]  Chief Complaint:  back pain, thigh pain.  History of Present Illness: Shawn [name] is a 63 y.o. male who presents with the chief complaint of back pain, thigh pain.  63 y.o.with PMHx significant for ALS and non verbal who had a fall from standing 2 days ago.  9/10 back pain.  Also feels thigh pain and weakness.  No bowel or bladder issues.       Review of Systems:  A 10 point review of systems is negative, except for the pertinent positives and negatives detailed in the HPI.  Past Medical History: Past Medical History:  Diagnosis Date   ALS (amyotrophic lateral sclerosis) (HCC)    Asthma    Coronary artery disease    Hyperlipidemia    Renal stone    Stroke [facility])     Past Surgical History: Past Surgical History:  Procedure Laterality Date   CARDIAC CATHETERIZATION  2006   negative   CYSTOSCOPY W/ URETERAL STENT REMOVAL Left 06/11/2015   Procedure: CYSTOSCOPY WITH STENT EXCHANGE;  Surgeon: Malen Gauze, MD;  Location: [facility] ORS;  Service: Urology;  Laterality: Left;   CYSTOSCOPY WITH STENT PLACEMENT Left 05/20/2015   Procedure: CYSTOSCOPY WITH STENT PLACEMENT;  Surgeon: Crist Fat, MD;  Location: [facility] ORS;  Service: Urology;  Laterality: Left;   CYSTOSCOPY/RETROGRADE/URETEROSCOPY/STONE EXTRACTION WITH BASKET  2000   pt. unsure of which side done   CYSTOSCOPY/URETEROSCOPY/HOLMIUM LASER Left 06/11/2015   Procedure: CYSTOSCOPY/URETEROSCOPY/HOLMIUM LASER;  Surgeon: Malen Gauze, MD;  Location: [facility] ORS;  Service: Urology;  Laterality: Left;   IR KYPHO LUMBAR INC FX REDUCE BONE BX UNI/BIL CANNULATION INC/IMAGING  01/06/2022   KNEE SURGERY Right 84,96, 2003   x3   SHOULDER SURGERY Left 2010    Problem List: Patient Active Problem List   Diagnosis Date Noted   Syncope 10/20/2023   Compression fracture of body of thoracic vertebra  (HCC) 10/20/2023   S/P percutaneous endoscopic gastrostomy (PEG) tube placement (HCC) 10/20/2023   CAD (coronary artery disease) 10/20/2023   Cervical radicular pain 02/06/2023   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   Chronic right shoulder pain 02/06/2023   Chronic pain syndrome 06/28/2022   Pain management contract signed 06/28/2022   Closed compression fracture of body of L1 vertebra (HCC) 04/03/2022   Dyslipidemia 04/03/2022   Right leg pain 11/13/2021   COVID-19 virus infection 11/13/2021   Acute hypoxemic respiratory failure due to COVID-19 (HCC) 11/13/2021   Acute respiratory failure with hypoxia (HCC) 11/12/2021   Compression fracture of L2 (HCC) 11/07/2021   Lumbago 11/07/2021   Pain in both lower legs 11/07/2021   ALS (amyotrophic lateral sclerosis) (HCC) 11/07/2021   Asthma, chronic 11/07/2021   Ureteral stone 05/20/2015    Allergies: Allergies as of 10/20/2023 - Review Complete 10/20/2023  Allergen Reaction Noted   Aspirin Other (See Comments), Itching, Shortness Of Breath, and Swelling 01/20/2015   Compazine [prochlorperazine] Other (See Comments) 11/08/2021   Morphine and codeine Itching 05/20/2015   Naproxen Other (See Comments) 01/20/2015   Penicillins Rash 05/20/2015   Tramadol Itching 09/25/2017    Medications: @ENCMED @  Social History: Social History   Tobacco Use   Smoking status: Never   Smokeless tobacco: Never  Vaping Use   Vaping status: Never Used  Substance Use Topics   Alcohol use: Not Currently    Alcohol/week: 0.0 standard drinks of alcohol  Comment: occasionally   Drug use: Never    Family Medical History: Family History  Problem Relation Age of Onset   Cancer Mother    Heart failure Mother    Hypertension Mother    CVA Mother    Diabetes Other    Other Father        unknown medical history    Physical Examination: @VITALWITHPAIN @  General: Patient is well developed, well nourished, calm, collected, and in  no apparent distress.  Psychiatric: Patient is non-anxious.  Head:  Pupils equal, round, and reactive to light.  ENT:  Oral mucosa appears well hydrated.  Neck:   Supple.  Full range of motion.  Respiratory: Patient is breathing without any difficulty.  Extremities: No edema.  Vascular: Palpable pulses.  Skin:   On exposed skin, there are no abnormal skin lesions.  NEUROLOGICAL:  General: In no acute distress.   Awake, alert, oriented to person, place, and time by communication with board.  Pupils equal round and reactive to light.  Facial tone is symmetric.  Tongue protrusion is midline.  There is no pronator drift.    Strength: Side Biceps Triceps Deltoid Interossei Grip Wrist Ext. Wrist Flex.  R 5 5 5 5 5 5 5   L 5 5 5 5 5 5 5    Side Iliopsoas Quads Hamstring PF DF EHL  R 3 3 4- 4 4 4   L 3 3 4- 4 4 4    Reflexes are 2+ and symmetric at the biceps, triceps, brachioradialis, patella and achilles.   Bilateral upper and lower extremity sensation is intact to light touch and pin prick.  Clonus is not present.  Toes are down-going.   Hoffman's is absent.  Imaging: CT:Age indeterminate superior endplate compression fracture of T10.   MRI: Acute T10 compression fracture with mild height loss.   I have personally reviewed the images and agree with the above interpretation.  Assessment and Plan: Mr. Kendzierski is a pleasant 63 y.o. male with acute T10 compression fracture  I have discussed the condition with the patient, including showing the radiographs and discussing treatment options in layman's terms.  The patient may benefit from conservative management.  Thus, I have recommended the following:   1)  No acute surgical indications.    2) TLSO brace  3)  Standing xrays with brace   4) OK to mobilize once brace in place.     Will continue to follow  Thank you for involving me in the care of this patient. I will keep you apprised of the patient's progress.   This note  was partially dictated using voice recognition software, so please excuse any errors that were not corrected.   Peterson Ao MD, PHD

## 2023-10-21 NOTE — Progress Notes (Signed)
OT Cancellation Note  Patient Details Name: Shawn [name] MRN: [id number] DOB: 03-04-1961   Cancelled Treatment:    Reason Eval/Treat Not Completed: Medical issues which prohibited therapy. Patient has neurosurgery consult pending for acute T10. Will hold OT until after consult completed   Constance Goltz 10/21/2023, 11:12 AM

## 2023-10-21 NOTE — Plan of Care (Signed)
  Problem: Education: Goal: Knowledge of condition and prescribed therapy will improve Outcome: Progressing   Problem: Education: Goal: Knowledge of General Education information will improve Description: Including pain rating scale, medication(s)/side effects and non-pharmacologic comfort measures Outcome: Progressing   Problem: Clinical Measurements: Goal: Will remain free from infection Outcome: Progressing   Problem: Coping: Goal: Level of anxiety will decrease Outcome: Progressing

## 2023-10-21 NOTE — Progress Notes (Addendum)
Initial Nutrition Assessment  DOCUMENTATION CODES:   Not applicable  INTERVENTION:  Change feeding from continuous to Bolus feedings.  Bolus feeding: Shawn [name] farm1.4  one carton 4 x day =1820kcal, 80 g pro, 924 ml FW FWF: 145 ml Every 4 hours =   NUTRITION DIAGNOSIS:   Inadequate oral intake related to inability to eat as evidenced by NPO status.    GOAL:   Patient will meet greater than or equal to 90% of their needs    MONITOR:   TF tolerance  REASON FOR ASSESSMENT:   Consult Enteral/tube feeding initiation and management, Assessment of nutrition requirement/status (patient wanted to speak with RD about not tolerating current feeding.)  ASSESSMENT:  63 y.o. M, Presented to ED with complaints of syncope, T12 compression fracture, due to fall.  Patient reported dizzy at home consequently causing fall to floor hitting head. PMH: ALS, CAD, HLD, CVA, asthma, renal stone. PEG dependent.  Remote assessment. Patient non verbal communicates by writing.  All information obtained through EMR and team.  RN reports patient home feeding regimen is bolus feeding of Nutren 1.5 1 carton ( ) four times day. Currently pt is receiving Osmolite 1.2 continuous feeding with a goal rate of 50/ml hr. NP reports that that pt communicated that continuous feeding was making him sick and he stopped the feeding his self.  Patient has had PEG tube since 05/2022.  NP further reported that the patient had concerns about current feeding and requested to speak with RD.  Will change patient to comparable formula and bolus feedings.  ENN calculated with current admit weight although suspect maybe inaccurate due to it is exact number as December weight.  Admit weight: 58.2Kg  Weight history: 10/21/23 61.6 kg  09/13/23 61.6 kg  07/05/23 62.1 kg  04/11/23 62.1 kg  02/28/23 62.1 kg  02/06/23 62.1 kg  01/12/23 62.1 kg  12/14/22 62.1 kg  10/12/22 55.3 kg      Average Meal Intake:  NPO  Nutritionally Relevant Medications: Scheduled Meds: Reviewed  Continuous Infusions:  feeding supplement (OSMOLITE 1.2 CAL) Stopped (10/21/23 0200)   PRN Meds:.acetaminophen, albuterol, HYDROmorphone (DILAUDID) injection, ondansetron **OR** ondansetron (ZOFRAN) IV  Labs Reviewed:  CBG ranges from 109-117 mg/dL over the last 24 hours    NUTRITION - FOCUSED PHYSICAL EXAM:  deferred  Diet Order:   Diet Order     None       EDUCATION NEEDS:   No education needs have been identified at this time  Skin:  Skin Assessment: Reviewed RN Assessment  Last BM:  PTA  Height:   Ht Readings from Last 1 Encounters:  09/13/23 5\' 10"  (1.778 m)    Weight:   Wt Readings from Last 1 Encounters:  10/21/23 61.6 kg    Ideal Body Weight:     BMI:  Body mass index is 19.49 kg/m.  Estimated Nutritional Needs:   Kcal:  1545-1855 kcal  Protein:  80-100 g  Fluid:  62ml/kcal    Jamelle Haring RDN, LDN Clinical Dietitian   If unable to reach, please contact "RD Inpatient" secure chat group between 8 am-4 pm daily"

## 2023-10-21 NOTE — Progress Notes (Signed)
  Echocardiogram 2D Echocardiogram has been performed.  Shawn [name] 10/21/2023, 9:14 AM

## 2023-10-21 NOTE — Plan of Care (Signed)
  Problem: Education: Goal: Knowledge of General Education information will improve Description: Including pain rating scale, medication(s)/side effects and non-pharmacologic comfort measures Outcome: Progressing   Problem: Clinical Measurements: Goal: Respiratory complications will improve 10/21/2023 0637 by Myles Gip, RN Outcome: Progressing 10/20/2023 2226 by Myles Gip, RN Outcome: Progressing   Problem: Clinical Measurements: Goal: Cardiovascular complication will be avoided 10/21/2023 4742 by Myles Gip, RN Outcome: Progressing 10/20/2023 2226 by Myles Gip, RN Outcome: Progressing   Problem: Nutrition: Goal: Adequate nutrition will be maintained Outcome: Progressing   Problem: Pain Managment: Goal: General experience of comfort will improve and/or be controlled 10/21/2023 0637 by Myles Gip, RN Outcome: Progressing 10/20/2023 2226 by Myles Gip, RN Outcome: Progressing

## 2023-10-21 NOTE — Progress Notes (Signed)
PT Cancellation Note  Patient Details Name: HUTSON NAJARRO MRN: [id number] DOB: [birth year]   Cancelled Treatment:    Reason Eval/Treat Not Completed: Medical issues which prohibited therapy Patient has neurosurgery consult pending for acute T10. Will hold PT until after consult completed.    Anan Dapolito 10/21/2023, 8:50 AM

## 2023-10-22 DIAGNOSIS — R55 Syncope and collapse: Secondary | ICD-10-CM

## 2023-10-22 LAB — GLUCOSE, CAPILLARY
Glucose-Capillary: 102 mg/dL — ABNORMAL HIGH (ref 70–99)
Glucose-Capillary: 106 mg/dL — ABNORMAL HIGH (ref 70–99)
Glucose-Capillary: 128 mg/dL — ABNORMAL HIGH (ref 70–99)
Glucose-Capillary: 128 mg/dL — ABNORMAL HIGH (ref 70–99)
Glucose-Capillary: 139 mg/dL — ABNORMAL HIGH (ref 70–99)
Glucose-Capillary: 139 mg/dL — ABNORMAL HIGH (ref 70–99)

## 2023-10-22 MED ORDER — HYDROMORPHONE HCL 1 MG/ML IJ SOLN
1.0000 mg | INTRAMUSCULAR | Status: DC | PRN
  Administered 2023-10-22 – 2023-10-23 (×7): 1 mg via INTRAVENOUS
  Filled 2023-10-22 (×7): qty 1

## 2023-10-22 MED ORDER — POLYETHYLENE GLYCOL 3350 17 G PO PACK
17.0000 g | PACK | Freq: Every day | ORAL | Status: DC
  Administered 2023-10-22: 17 g via ORAL
  Filled 2023-10-22 (×2): qty 1

## 2023-10-22 NOTE — Evaluation (Signed)
Physical Therapy Evaluation Patient Details Name: Shawn [name] MRN: [id number] DOB: Jan 04, 1961 Today's Date: 10/22/2023  History of Present Illness  Pt admitted for syncope and fall. History includes ALS, CAD, HLD, CVA, has PEG and is non verbal. Pt now s/p T10 compression fx and is required to wear TLSO.  Clinical Impression  Pt is a pleasant 63 year old male who was admitted for syncope and fall. Pt performs bed mobility/transfers with mod assist +2 using HHA. Will bring RW for next session as he uses AD at baseline. Is non ambulatory at baseline. Pt demonstrates deficits with strength/mobility/balance. Is able to SPT from bed<>chair, however needs +2 at this time. Pt unsure if he would qualify for services due to hospice care- concerns relayed to TOC. Would benefit from skilled PT to address above deficits and promote optimal return to PLOF. Pt will continue to receive skilled PT services while admitted and will defer to TOC/care team for updates regarding disposition planning.         If plan is discharge home, recommend the following: A lot of help with walking and/or transfers;A lot of help with bathing/dressing/bathroom   Can travel by private vehicle        Equipment Recommendations None recommended by PT  Recommendations for Other Services       Functional Status Assessment Patient has had a recent decline in their functional status and/or demonstrates limited ability to make significant improvements in function in a reasonable and predictable amount of time     Precautions / Restrictions Precautions Precautions: Fall;Back Precaution Booklet Issued: No Required Braces or Orthoses: Spinal Brace Spinal Brace: Thoracolumbosacral orthotic Restrictions Weight Bearing Restrictions Per Provider Order: No      Mobility  Bed Mobility Overal bed mobility: Needs Assistance Bed Mobility: Sit to Supine       Sit to supine: Mod assist, +2 for physical assistance   General  bed mobility comments: needs assist to return supine and +2 for scooting up towards HOB.    Transfers Overall transfer level: Needs assistance Equipment used: 2 person hand held assist Transfers: Bed to chair/wheelchair/BSC   Stand pivot transfers: Mod assist, +2 physical assistance, +2 safety/equipment         General transfer comment: Pt requesting +2 assist as no AD available in room. Able to perform SPT from recliner to bed    Ambulation/Gait               General Gait Details: non amb at baseline  Stairs            Wheelchair Mobility     Tilt Bed    Modified Rankin (Stroke Patients Only)       Balance Overall balance assessment: Needs assistance, History of Falls Sitting-balance support: Feet supported, Bilateral upper extremity supported Sitting balance-Leahy Scale: Good     Standing balance support: Bilateral upper extremity supported Standing balance-Leahy Scale: Fair                               Pertinent Vitals/Pain Pain Assessment Pain Assessment: Faces Faces Pain Scale: Hurts whole lot Pain Location: mid back Pain Descriptors / Indicators: Aching Pain Intervention(s): Limited activity within patient's tolerance, Repositioned, Premedicated before session    Home Living Family/patient expects to be discharged to:: Private residence Living Arrangements: Alone Available Help at Discharge: Family Type of Home: House Home Access: Ramped entrance       Home Layout:  One level Home Equipment: Agricultural consultant (2 wheels);Wheelchair - power;Hospital bed Additional Comments: reports he has all DME required at home    Prior Function Prior Level of Function : Driving;Patient poor historian/Family not available             Mobility Comments: pt reports he SPT between surfaces and uses his RW. Uses WC for all mobility. ADLs Comments: reports he is able to drive     Extremity/Trunk Assessment   Upper Extremity  Assessment Upper Extremity Assessment: Generalized weakness    Lower Extremity Assessment Lower Extremity Assessment: Generalized weakness       Communication   Communication Communication: Difficulty communicating thoughts/reduced clarity of speech (uses whiteboard)  Cognition Arousal: Alert Behavior During Therapy: WFL for tasks assessed/performed Overall Cognitive Status: Within Functional Limits for tasks assessed                                 General Comments: is able to understand everything I say, however takes increased time for communication        General Comments      Exercises     Assessment/Plan    PT Assessment Patient needs continued PT services  PT Problem List Decreased strength;Decreased balance;Decreased mobility       PT Treatment Interventions DME instruction;Therapeutic activities;Therapeutic exercise;Balance training;Wheelchair mobility training    PT Goals (Current goals can be found in the Care Plan section)  Acute Rehab PT Goals Patient Stated Goal: to go home PT Goal Formulation: With patient Time For Goal Achievement: 11/05/23 Potential to Achieve Goals: Fair    Frequency Min 1X/week     Co-evaluation               AM-PAC PT "6 Clicks" Mobility  Outcome Measure Help needed turning from your back to your side while in a flat bed without using bedrails?: A Lot Help needed moving from lying on your back to sitting on the side of a flat bed without using bedrails?: A Lot Help needed moving to and from a bed to a chair (including a wheelchair)?: A Lot Help needed standing up from a chair using your arms (e.g., wheelchair or bedside chair)?: A Lot Help needed to walk in hospital room?: Total Help needed climbing 3-5 steps with a railing? : Total 6 Click Score: 10    End of Session   Activity Tolerance: Patient tolerated treatment well Patient left: in bed;with bed alarm set Nurse Communication: Mobility  status PT Visit Diagnosis: Unsteadiness on feet (R26.81);Muscle weakness (generalized) (M62.81);History of falling (Z91.81);Difficulty in walking, not elsewhere classified (R26.2);Pain Pain - Right/Left:  (midline) Pain - part of body:  (back)    Time: 1610-9604 PT Time Calculation (min) (ACUTE ONLY): 29 min   Charges:   PT Evaluation $PT Eval Moderate Complexity: 1 Mod PT Treatments $Therapeutic Activity: 8-22 mins PT General Charges $$ ACUTE PT VISIT: 1 Visit         Elizabeth Palau, PT, DPT, GCS [phone]   Jerri Hargadon 10/22/2023, 3:42 PM

## 2023-10-22 NOTE — Plan of Care (Signed)

## 2023-10-22 NOTE — Evaluation (Signed)
Occupational Therapy Evaluation Patient Details Name: Shawn [name] MRN: [id number] DOB: 1961/05/06 Today's Date: 10/22/2023   History of Present Illness Pt admitted for syncope and fall. History includes ALS, CAD, HLD, CVA, has PEG and is non verbal. Pt now s/p T10 compression fx and is required to wear TLSO.   Clinical Impression   Pt was seen for OT evaluation this date. Prior to hospital admission, pt was mod indep with ADL, tube feeds, reports transferring himself at baseline and driving. Pt lives alone and has hospice care coming 3x/wk for assist. Pt presents to acute OT demonstrating impaired ADL performance and functional mobility 2/2 back pain, decreased LB access, strength, and balance (See OT problem list for additional functional deficits). Pt currently requires MIN A +2 improving to MOD A +1 for SPT to/from [facility] for loose BM incontinence. Pt used whiteboard to write out his goal of returning home asap. Expressed not feeling heard regarding being given a medication to help him have a BM. Charge nurse notified. MAX A for LB ADL, pericare in standing and from bed level, and pt declined TLSO due to urgent need to use the [facility]. Pt would benefit from skilled OT services to address noted impairments and functional limitations (see below for any additional details) in order to maximize safety and independence while minimizing falls risk and caregiver burden.     If plan is discharge home, recommend the following: A lot of help with walking and/or transfers;A lot of help with bathing/dressing/bathroom;Assistance with cooking/housework;Assist for transportation;Help with stairs or ramp for entrance    Functional Status Assessment  Patient has had a recent decline in their functional status and demonstrates the ability to make significant improvements in function in a reasonable and predictable amount of time.  Equipment Recommendations  Other (comment) (defer)    Recommendations for Other  Services       Precautions / Restrictions Precautions Precautions: Fall;Back;Other (comment) Precaution Booklet Issued: No Precaution Comments: PEG tube Required Braces or Orthoses: Spinal Brace Spinal Brace: Thoracolumbosacral orthotic (pt declined TLSO due to urgent need to use BSC) Restrictions Weight Bearing Restrictions Per Provider Order: No      Mobility Bed Mobility Overal bed mobility: Needs Assistance Bed Mobility: Rolling, Sit to Sidelying Rolling: Supervision, Used rails       Sit to sidelying: Mod assist      Transfers Overall transfer level: Needs assistance Equipment used: 2 person hand held assist, 1 person hand held assist Transfers: Bed to chair/wheelchair/BSC   Stand pivot transfers: Min assist, +2 physical assistance, Mod assist         General transfer comment: MIN A +2 for initial transfer, MOD A +1 for secondary transfers      Balance Overall balance assessment: Needs assistance, History of Falls Sitting-balance support: Feet supported, Bilateral upper extremity supported Sitting balance-Leahy Scale: Fair     Standing balance support: Bilateral upper extremity supported Standing balance-Leahy Scale: Poor                             ADL either performed or assessed with clinical judgement   ADL Overall ADL's : Needs assistance/impaired                                       General ADL Comments: Pt required MAX A for pericare, MOD A for SPT to BSC,  MAX A for LB ADL, and MIN A For UB dressing.     Vision         Perception         Praxis         Pertinent Vitals/Pain Pain Assessment Pain Assessment: Faces Faces Pain Scale: Hurts whole lot Pain Location: low back, radiating down RLE Pain Descriptors / Indicators: Grimacing, Guarding, Radiating Pain Intervention(s): Limited activity within patient's tolerance, Monitored during session, Repositioned, Patient requesting pain meds-RN notified, RN  gave pain meds during session     Extremity/Trunk Assessment Upper Extremity Assessment Upper Extremity Assessment: Generalized weakness   Lower Extremity Assessment Lower Extremity Assessment: Generalized weakness       Communication Communication Communication: Difficulty communicating thoughts/reduced clarity of speech   Cognition Arousal: Alert Behavior During Therapy: WFL for tasks assessed/performed Overall Cognitive Status: Within Functional Limits for tasks assessed                                 General Comments: is able to understand everything I say, however takes increased time for communication using writing board and stylist     General Comments  Pt endorsed getting dizzy and clammy after prolonged sit on BSC for BM, pivoted back to bed and pt endorsing improvement once supine. RN, NT notified. L nipple became pinched when gait belt was being removed. RN notified. Pt declined RN to assess.    Exercises     Shoulder Instructions      Home Living Family/patient expects to be discharged to:: Private residence Living Arrangements: Alone Available Help at Discharge: Family Type of Home: House Home Access: Ramped entrance     Home Layout: One level               Home Equipment: Agricultural consultant (2 wheels);Wheelchair - power;Hospital bed   Additional Comments: reports he has all DME required at home      Prior Functioning/Environment Prior Level of Function : Driving;Patient poor historian/Family not available             Mobility Comments: pt reports he SPT between surfaces and uses his RW. Uses WC for all mobility. ADLs Comments: reports he is able to drive        OT Problem List: Decreased strength;Pain;Decreased activity tolerance;Decreased knowledge of use of DME or AE;Impaired balance (sitting and/or standing);Decreased knowledge of precautions;Impaired UE functional use      OT Treatment/Interventions: Self-care/ADL  training;Therapeutic exercise;Therapeutic activities;DME and/or AE instruction;Patient/family education;Balance training    OT Goals(Current goals can be found in the care plan section) Acute Rehab OT Goals Patient Stated Goal: go home OT Goal Formulation: With patient Time For Goal Achievement: 11/05/23 Potential to Achieve Goals: Fair ADL Goals Pt Will Perform Lower Body Dressing: with caregiver independent in assisting;sitting/lateral leans;sit to/from stand Pt Will Transfer to Toilet: with contact guard assist;stand pivot transfer;bedside commode (LRAD) Pt Will Perform Toileting - Clothing Manipulation and hygiene: sitting/lateral leans;sit to/from stand;with contact guard assist Additional ADL Goal #1: Pt will don/doff LSO with mod indep  OT Frequency: Min 1X/week    Co-evaluation              AM-PAC OT "6 Clicks" Daily Activity     Outcome Measure Help from another person eating meals?: None Help from another person taking care of personal grooming?: A Little Help from another person toileting, which includes using toliet, bedpan, or urinal?: A Lot  Help from another person bathing (including washing, rinsing, drying)?: A Lot Help from another person to put on and taking off regular upper body clothing?: A Little Help from another person to put on and taking off regular lower body clothing?: A Lot 6 Click Score: 16   End of Session Equipment Utilized During Treatment: Gait belt Nurse Communication: Mobility status;Patient requests pain meds;Other (comment) (nipple scratch, needs meds, dizziness)  Activity Tolerance: Patient limited by pain Patient left: in bed;with call bell/phone within reach;with bed alarm set;with family/visitor present  OT Visit Diagnosis: Other abnormalities of gait and mobility (R26.89);Muscle weakness (generalized) (M62.81);Pain Pain - Right/Left: Right (low back) Pain - part of body: Hip;Leg                Time: 2952-8413 OT Time Calculation  (min): 57 min Charges:  OT General Charges $OT Visit: 1 Visit OT Evaluation $OT Eval Moderate Complexity: 1 Mod OT Treatments $Self Care/Home Management : 38-52 mins  Arman Filter., MPH, MS, OTR/L ascom [phone] 10/22/23, 5:21 PM

## 2023-10-22 NOTE — Progress Notes (Signed)
Progress Note   Patient: Shawn [name]:[id number] DOB: 12-01-1960 DOA: 10/20/2023     2 DOS: the patient was seen and examined on 10/22/2023   Brief hospital course: Shawn [name] is a 63 y.o. male with medical history significant of ALS, CAD, hyperlipidemia, history of CVA presenting with syncope versus presyncope, T12 compression fracture, fall.  Patient reports feeling dizzy at home with patient subsequently fell on the floor.  Positive head trauma per patient.  Noted baseline ALS with significant generalized weakness chronically.  PEG tube also in place.  Positive chest discomfort and weakness prior to event.  Reports remote history of similar episode roughly 6 months ago.  Does not report true passing out.  No fevers or chills.  PEG tube in place.  No reported nausea.  Positive generalized weakness numbness and worsening functional status. Presented to the ER afebrile, hemodynamically stable.  Satting well on room air.  White count 6.3, hemoglobin 13.7, platelets 177, creatinine 0.6.  CK 68.  Imaging including CT head, CT CT and L-spine grossly stable apart from T10 compression fracture which may be chronic/age-indeterminate.    Assessment and Plan:  * Syncope Had a syncopal episode  in the setting of baseline ALS and poor functional status Unable to assess orthostatics as patient is unable to fully ambulate, patient is wheelchair-bound MRI of the brain negative for any acute findings 2D echocardiogram shows a normal LVEF with no evidence of aortic stenosis     Acute compression fracture of body of thoracic vertebra (HCC) (T10) Fall Traumatic and following a fall Appreciate neurosurgery input Continue pain control, increase Dilaudid to 1 mg every 3 hours No indication for surgical repair 2) TLSO brace 3) Standing xrays with brace 4) OK to mobilize once brace in place.      History of CVA ALS S/P percutaneous endoscopic gastrostomy (PEG) tube placement (HCC) Continue  tube feeds 4 times daily Administer free water Continue Riluzole     Asthma, chronic Stable from a respiratory standpoint Continue as needed bronchodilators              Subjective: Patient is seen and examined at the bedside.  Complains of worsening back pain  Physical Exam: Vitals:   10/21/23 1535 10/21/23 2326 10/22/23 0500 10/22/23 0830  BP: 109/71 (!) 104/58  130/83  Pulse: 70 67  73  Resp: 14 18  16   Temp: 98.3 F (36.8 C) 98.4 F (36.9 C)  98.2 F (36.8 C)  TempSrc: Oral   Oral  SpO2: 94% 94%  92%  Weight:   61.7 kg     Head: Normocephalic.     Nose: Nose normal.     Mouth/Throat:     Mouth: Mucous membranes are moist Eyes:     Pupils: Pupils are equal, round, and reactive to light.  Cardiovascular:     Rate and Rhythm: Normal rate and regular rhythm.  Pulmonary:     Effort: Pulmonary effort is normal.  Abdominal:     General: Bowel sounds are normal.  Musculoskeletal:     Comments: + generalized weakness and tremor   Skin:    General: Skin is dry.  Neurological:     Comments: + generalized weakness and tremor     .   Data Reviewed: Labs reviewed There are no new results to review at this time.  Family Communication: Plan of care discussed with patient.  Patient is nonverbal and a dry erase board was used for communication  Disposition: Status  is: Inpatient Remains inpatient appropriate because: Pain control  Planned Discharge Destination:  TBD    Time spent: 33 minutes  Author: Lucile Shutters, MD 10/22/2023 12:28 PM  For on call review [email or web address].

## 2023-10-23 DIAGNOSIS — R55 Syncope and collapse: Secondary | ICD-10-CM | POA: Diagnosis not present

## 2023-10-23 DIAGNOSIS — E440 Moderate protein-calorie malnutrition: Secondary | ICD-10-CM | POA: Insufficient documentation

## 2023-10-23 LAB — GLUCOSE, CAPILLARY
Glucose-Capillary: 105 mg/dL — ABNORMAL HIGH (ref 70–99)
Glucose-Capillary: 128 mg/dL — ABNORMAL HIGH (ref 70–99)

## 2023-10-23 MED ORDER — OSMOLITE 1.5 CAL PO LIQD: 237.0000 mL | Freq: Four times a day (QID) | ORAL | Status: DC

## 2023-10-23 MED ORDER — HYDROCODONE-ACETAMINOPHEN 5-325 MG PO TABS: 2.0000 | ORAL_TABLET | Freq: Four times a day (QID) | ORAL | 0 refills | Status: AC | PRN

## 2023-10-23 NOTE — Progress Notes (Signed)
Nutrition Follow-up  DOCUMENTATION CODES:   Underweight, Non-severe (moderate) malnutrition in context of chronic illness  INTERVENTION:   -If pt does not discharge today, will attempt to obtain home formula:  1 carton 250 ml Nutren 1.5 4 times daily  100 ml free water flush before and after each feeding administration  Tube feeding regimen provides 1520 kcal (90% of needs), 68 grams of protein, and 764 ml of H2O. Total free water: 1564 ml daily  NUTRITION DIAGNOSIS:   Moderate Malnutrition related to chronic illness (ALS, stroke) as evidenced by mild fat depletion, moderate fat depletion, moderate muscle depletion, severe muscle depletion.  Ongoing  GOAL:   Patient will meet greater than or equal to 90% of their needs  Progressing   MONITOR:   TF tolerance  REASON FOR ASSESSMENT:   Consult Enteral/tube feeding initiation and management  ASSESSMENT:   63 y.o. M, Presented to ED with complaints of syncope, T12 compression fracture, due to fall.  Patient reported dizzy at home consequently causing fall to floor hitting head. PMH: ALS, CAD, HLD, CVA, asthma, renal stone. PEG dependent.  Reviewed I/O's: -850 ml x 24 hours and -3.3 L since admission  UOP: 850 ml x 24 hours  RD received consult for enteral nutrition management. Per prior RD notes, TF protocol was started on 10/20/23 while pt was still in the ED. Pt reports he did not tolerate continuous feedings (made him sick) and stopped them himself. RD switched pt to [employer] bolus feedings on 10/21/23.   Pt lying in bed at time of visit. Pt with ALS and communicates using a white board. He reports not feeling well and has pain in his back and legs, which is chronic (pt reports that have been giving medication for pain relief).   Discussed TF regimen with pt. Pt reports home regimen of 1 carton Nutren 1.5 4 times daily. He does not consume anything by mouth and uses PEG for sole source nutrition (which was placed in  05/2022). Per pt, he has been on Nutren formula since that time. Pt receives his supplies from [employer] and denies any difficulty obtaining supplies and formula; pt also has an adequate supply at home.   RD explained that Nutren 1.5 was not available on the hospital formulary; discussed alternative formulas (Osmolite 1.5 or Molli Posey which is currently ordered). Pt refused formulary alternative. RD explained to pt that formula could be ordered, but unsure how long it will take. Pt expressed that he believes he will be discharged home today and would like to forego TF administration in the hospital until he goes home.   Reviewed wt hx; pt has experienced a 7.2% wt loss over the past 6 months. While this is not significant for time frame, it is concerning given malnutrition, underweight status, and ALS.  Case discussed with RN and MD; reported above findings. Per MD, unsure if pt will discharge home today. Discharge was not discussed yesterday secondary to pain. RD will order home TF formula if pt does not discharge.   Medications reviewed and include miralax.   Labs reviewed: CBGS: 102-139 (inpatient orders for glycemic control are none).    NUTRITION - FOCUSED PHYSICAL EXAM:  Flowsheet Row Most Recent Value  Orbital Region Mild depletion  Upper Arm Region Moderate depletion  Thoracic and Lumbar Region Moderate depletion  Buccal Region Mild depletion  Temple Region Severe depletion  Clavicle Bone Region Moderate depletion  Clavicle and Acromion Bone Region Moderate depletion  Scapular Bone  Region Moderate depletion  Dorsal Hand Moderate depletion  Patellar Region Severe depletion  Anterior Thigh Region Severe depletion  Posterior Calf Region Severe depletion  Edema (RD Assessment) None  Hair Reviewed  Eyes Reviewed  Mouth Reviewed  Skin Reviewed  Nails Reviewed       Diet Order:   Diet Order     None       EDUCATION NEEDS:   Education needs have been  addressed  Skin:  Skin Assessment: Reviewed RN Assessment  Last BM:  10/22/23 (type 4)  Height:   Ht Readings from Last 1 Encounters:  09/13/23 5\' 10"  (1.778 m)    Weight:   Wt Readings from Last 1 Encounters:  10/23/23 57.6 kg    Ideal Body Weight:  75.5 kg  BMI:  Body mass index is 18.22 kg/m.  Estimated Nutritional Needs:   Kcal:  1700-1900  Protein:  85-100 grams  Fluid:  > 1.7 L    Levada Schilling, RD, LDN, CDCES Registered Dietitian III Certified Diabetes Care and Education Specialist If unable to reach this RD, please use "RD Inpatient" group chat on secure chat between hours of 8am-4 pm daily

## 2023-10-23 NOTE — TOC Progression Note (Signed)
Transition of Care [facility]) - Progression Note    Patient Details  Name: Shawn [name] MRN: [id number] Date of Birth: 1961-09-05  Transition of Care [facility]) CM/SW Contact  Marlowe Sax, RN Phone Number: 10/23/2023, 12:15 PM  Clinical Narrative:     Spoke with the patient and he is a current hospice patient  He drives He is mute He has a rolling walker and all other needed DME I called [facility] and requested to see if he can also get PT, They will not be able to do so but will alert his Hospice nurse of the DC       Expected Discharge Plan and Services         Expected Discharge Date: 10/23/23                                     Social Determinants of Health (SDOH) Interventions SDOH Screenings   Food Insecurity: No Food Insecurity (10/20/2023)  Housing: Unknown (10/20/2023)  Transportation Needs: No Transportation Needs (10/20/2023)  Utilities: Not At Risk (10/20/2023)  Depression (PHQ2-9): Low Risk  (07/05/2023)  Social Connections: Moderately Isolated (10/20/2023)  Tobacco Use: Low Risk  (10/20/2023)    Readmission Risk Interventions     No data to display

## 2023-10-23 NOTE — Care Management Important Message (Signed)
Important Message  Patient Details  Name: Shawn [name] MRN: [id number] Date of Birth: 1960/10/03   Important Message Given:  Yes - Medicare IM     Baer Hinton W, CMA 10/23/2023, 11:01 AM

## 2023-10-23 NOTE — Progress Notes (Signed)
Per MD pt will d/c with foley due to retention. Hospice nurse will follow up with foley care once pt is home.

## 2023-10-23 NOTE — Plan of Care (Signed)
Patient alert and oriented, using written tablet to communicate. No distress noted. No complaints voiced. All needs attended to. Will continue to monitor.   Problem: Education: Goal: Knowledge of condition and prescribed therapy will improve Outcome: Progressing   Problem: Physical Regulation: Goal: Complications related to the disease process, condition or treatment will be avoided or minimized Outcome: Progressing   Problem: Clinical Measurements: Goal: Ability to maintain clinical measurements within normal limits will improve Outcome: Progressing   Problem: Clinical Measurements: Goal: Will remain free from infection Outcome: Progressing   Problem: Elimination: Goal: Will not experience complications related to bowel motility Outcome: Progressing   Problem: Pain Managment: Goal: General experience of comfort will improve and/or be controlled Outcome: Progressing   Problem: Safety: Goal: Ability to remain free from injury will improve Outcome: Progressing

## 2023-10-23 NOTE — Discharge Summary (Signed)
Physician Discharge Summary   Patient: Shawn [name] MRN: [id number] DOB: Jul 06, 1961  Admit date:     10/20/2023  Discharge date: 10/23/23  Discharge Physician: Ksean Vale   PCP: [facility]   Recommendations at discharge:   Use TLSO brace as recommended  Discharge Diagnoses: Principal Problem:   Syncope Active Problems:   Compression fracture of body of thoracic vertebra (HCC)   ALS (amyotrophic lateral sclerosis) (HCC)   Asthma, chronic   S/P percutaneous endoscopic gastrostomy (PEG) tube placement (HCC)   CAD (coronary artery disease)   Malnutrition of moderate degree  Resolved Problems:   * No resolved hospital problems. *  Hospital Course:  Shawn [name] is a 63 y.o. male with medical history significant of ALS, CAD, hyperlipidemia, history of CVA presenting with syncope versus presyncope, T12 compression fracture, fall.  Patient reports feeling dizzy at home with patient subsequently fell on the floor.  Positive head trauma per patient.  Noted baseline ALS with significant generalized weakness chronically.  PEG tube also in place.  Positive chest discomfort and weakness prior to event.  Reports remote history of similar episode roughly 6 months ago.  Does not report true passing out.  No fevers or chills.  PEG tube in place.  No reported nausea.  Positive generalized weakness numbness and worsening functional status. Presented to the ER afebrile, hemodynamically stable.  Satting well on room air.  White count 6.3, hemoglobin 13.7, platelets 177, creatinine 0.6.  CK 68.  Imaging including CT head, CT CT and L-spine grossly stable apart from T10 compression fracture which may be chronic/age-indeterminate..   Assessment and Plan:  * Syncope Had a syncopal episode  in the setting of baseline ALS and poor functional status Unable to assess orthostatics as patient is unable to fully ambulate, patient is wheelchair-bound MRI of the brain negative for  any acute findings 2D echocardiogram shows a normal LVEF with no evidence of aortic stenosis     Acute compression fracture of body of thoracic vertebra (HCC) (T10) Fall Traumatic and following a fall Appreciate neurosurgery input Continue pain control No indication for surgical repair 2) TLSO brace 3) Standing xrays with brace 4) OK to mobilize once brace in place.      History of CVA ALS S/P percutaneous endoscopic gastrostomy (PEG) tube placement (HCC) Continue tube feeds 4 times daily Administer free water Continue Riluzole     Asthma, chronic Stable from a respiratory standpoint Continue as needed bronchodilators    Moderate malnutrition Moderate Malnutrition related to chronic illness (ALS, stroke) as evidenced by mild fat depletion, moderate fat depletion, moderate muscle depletion, severe muscle depletion. Continue tube feeds       Consultants: Neurosurgery Procedures performed: None Disposition: Home Diet recommendation:  NPO . DISCHARGE MEDICATION: Allergies as of 10/23/2023       Reactions   Aspirin Other (See Comments), Itching, Shortness Of Breath, Swelling   Upset stomach  GI Upset   Compazine [prochlorperazine] Other (See Comments)   Added per pt. Pt had tachycardia chest discomfort   Morphine And Codeine Itching   Naproxen Other (See Comments)   Upset stomach  GI Upset   Penicillins Rash   Tramadol Itching        Medication List     PAUSE taking these medications    HYDROcodone-acetaminophen 5-325 MG tablet Wait to take this until: October 28, 2023 Commonly known as: NORCO/VICODIN Take 1 tablet by mouth every 8 (eight) hours as needed for severe pain. Must  last 30 days. You also have another medication with the same name that you may need to continue taking.       STOP taking these medications    metoCLOPramide 5 MG tablet Commonly known as: REGLAN   oxyCODONE 5 MG/5ML solution Commonly known as: ROXICODONE   traZODone  50 MG tablet Commonly known as: DESYREL       TAKE these medications    acetaminophen 160 MG/5ML solution Commonly known as: TYLENOL Take 480 mg by mouth every 6 (six) hours as needed for mild pain or moderate pain.   albuterol 108 (90 Base) MCG/ACT inhaler Commonly known as: VENTOLIN HFA Inhale 1-2 puffs into the lungs every 6 (six) hours as needed for wheezing or shortness of breath.   arformoterol 15 MCG/2ML Nebu Commonly known as: BROVANA Take 15 mcg by nebulization every 12 (twelve) hours.   Baclofen 5 MG Tabs Take 10 mg by mouth 3 (three) times daily.   budesonide 0.25 MG/2ML nebulizer solution Commonly known as: PULMICORT Take 0.25 mg by nebulization 2 (two) times daily.   cyclobenzaprine 5 MG tablet Commonly known as: FLEXERIL Take 5 mg by mouth 3 (three) times daily.   fluticasone furoate-vilanterol 100-25 MCG/ACT Aepb Commonly known as: BREO ELLIPTA Inhale 1 puff into the lungs in the morning.   gabapentin 250 MG/5ML solution Commonly known as: NEURONTIN Place 12 mLs into feeding tube 3 (three) times daily.   HYDROcodone-acetaminophen 5-325 MG tablet Commonly known as: Norco Take 2 tablets by mouth every 6 (six) hours as needed for up to 5 days for severe pain (pain score 7-10). What changed: Another medication with the same name was paused. Ask your nurse or doctor if you should take this medication.   mirtazapine 15 MG tablet Commonly known as: REMERON Take 15 mg by mouth at bedtime.   montelukast 10 MG tablet Commonly known as: SINGULAIR Take 10 mg by mouth at bedtime.   promethazine 25 MG tablet Commonly known as: PHENERGAN Take 25 mg by mouth every 6 (six) hours.   riluzole 50 MG tablet Commonly known as: RILUTEK Take 50 mg by mouth 2 (two) times daily.        Discharge Exam: Filed Weights   10/21/23 0921 10/22/23 0500 10/23/23 0434  Weight: 58.2 kg 61.7 kg 57.6 kg    Head: Normocephalic.     Nose: Nose normal.     Mouth/Throat:      Mouth: Mucous membranes are moist Eyes:     Pupils: Pupils are equal, round, and reactive to light.  Cardiovascular:     Rate and Rhythm: Normal rate and regular rhythm.  Pulmonary:     Effort: Pulmonary effort is normal.  Abdominal:     General: Bowel sounds are normal.  Musculoskeletal:     Comments: + generalized weakness and tremor   Skin:    General: Skin is dry.  Neurological:     Comments: + generalized weakness and tremor        Condition at discharge: stable  The results of significant diagnostics from this hospitalization (including imaging, microbiology, ancillary and laboratory) are listed below for reference.   Imaging Studies: ECHOCARDIOGRAM COMPLETE Result Date: 10/21/2023    ECHOCARDIOGRAM REPORT   Patient Name:   DINARI HUSEBY Date of Exam: 10/21/2023 Medical Rec #:  [id number]        Height:       70.0 in Accession #:    [id number]       Weight:  135.8 lb Date of Birth:  Feb 03, 1961       BSA:          1.771 m Patient Age:    62 years         BP:           118/74 mmHg Patient Gender: M                HR:           76 bpm. Exam Location:  [facility] Procedure: 2D Echo Indications:     Syncope R55  History:         Patient has no prior history of Echocardiogram examinations.  Sonographer:     Overton Mam RDCS, FASE Referring Phys:  [id number] Francoise Schaumann [place] Diagnosing Phys: Armanda Magic MD  Sonographer Comments: No subcostal window. Image acquisition challenging due to respiratory motion. IMPRESSIONS  1. Left ventricular ejection fraction, by estimation, is 60 to 65%. The left ventricle has normal function. The left ventricle has no regional wall motion abnormalities. Left ventricular diastolic parameters were normal.  2. Right ventricular systolic function is normal. The right ventricular size is normal.  3. The mitral valve is normal in structure. No evidence of mitral valve regurgitation. No evidence of mitral stenosis.  4. The aortic valve is tricuspid. Aortic valve  regurgitation is not visualized. Aortic valve sclerosis/calcification is present, without any evidence of aortic stenosis. Aortic valve Vmax measures 1.34 m/s. FINDINGS  Left Ventricle: Left ventricular ejection fraction, by estimation, is 60 to 65%. The left ventricle has normal function. The left ventricle has no regional wall motion abnormalities. The left ventricular internal cavity size was normal in size. There is  no left ventricular hypertrophy. Left ventricular diastolic parameters were normal. Normal left ventricular filling pressure. Right Ventricle: The right ventricular size is normal. No increase in right ventricular wall thickness. Right ventricular systolic function is normal. Left Atrium: Left atrial size was normal in size. Right Atrium: Right atrial size was normal in size. Pericardium: There is no evidence of pericardial effusion. Mitral Valve: The mitral valve is normal in structure. No evidence of mitral valve regurgitation. No evidence of mitral valve stenosis. Tricuspid Valve: The tricuspid valve is normal in structure. Tricuspid valve regurgitation is trivial. No evidence of tricuspid stenosis. Aortic Valve: The aortic valve is tricuspid. Aortic valve regurgitation is not visualized. Aortic valve sclerosis/calcification is present, without any evidence of aortic stenosis. Aortic valve peak gradient measures 7.2 mmHg. Pulmonic Valve: The pulmonic valve was normal in structure. Pulmonic valve regurgitation is not visualized. No evidence of pulmonic stenosis. Aorta: The aortic root is normal in size and structure. Venous: The inferior vena cava was not well visualized. IAS/Shunts: No atrial level shunt detected by color flow Doppler.  LEFT VENTRICLE PLAX 2D LVIDd:         3.90 cm   Diastology LVIDs:         2.30 cm   LV e' medial:    12.90 cm/s LV PW:         0.90 cm   LV E/e' medial:  5.1 LV IVS:        1.00 cm   LV e' lateral:   14.10 cm/s LVOT diam:     1.80 cm   LV E/e' lateral: 4.7 LV SV:          44 LV SV Index:   25 LVOT Area:     2.54 cm  RIGHT VENTRICLE RV Basal diam:  2.80 cm RV S prime:     15.20 cm/s TAPSE (M-mode): 1.6 cm LEFT ATRIUM           Index        RIGHT ATRIUM          Index LA diam:      2.90 cm 1.64 cm/m   RA Area:     8.54 cm LA Vol (A2C): 15.6 ml 8.81 ml/m   RA Volume:   14.40 ml 8.13 ml/m LA Vol (A4C): 26.7 ml 15.08 ml/m  AORTIC VALVE                 PULMONIC VALVE AV Area (Vmax): 1.76 cm     PV Vmax:        0.81 m/s AV Vmax:        134.00 cm/s  PV Peak grad:   2.7 mmHg AV Peak Grad:   7.2 mmHg     RVOT Peak grad: 2 mmHg LVOT Vmax:      92.70 cm/s LVOT Vmean:     58.900 cm/s LVOT VTI:       0.171 m  AORTA Ao Root diam: 2.70 cm Ao Asc diam:  2.80 cm MITRAL VALVE               TRICUSPID VALVE MV Area (PHT): 2.84 cm    TR Peak grad:   15.1 mmHg MV Decel Time: 267 msec    TR Vmax:        194.00 cm/s MV E velocity: 65.60 cm/s MV A velocity: 66.80 cm/s  SHUNTS MV E/A ratio:  0.98        Systemic VTI:  0.17 m                            Systemic Diam: 1.80 cm Armanda Magic MD Electronically signed by Armanda Magic MD Signature Date/Time: 10/21/2023/4:22:54 PM    Final    MR BRAIN WO CONTRAST Result Date: 10/20/2023 CLINICAL DATA:  Syncope/presyncope with cerebrovascular cause suspected. ALS ; fall in kitchen. EXAM: MRI HEAD WITHOUT CONTRAST TECHNIQUE: Multiplanar, multiecho pulse sequences of the brain and surrounding structures were obtained without intravenous contrast. COMPARISON:  Head CT from earlier today FINDINGS: Brain: No acute infarction, hemorrhage, hydrocephalus, extra-axial collection or mass lesion. Vascular: Normal flow voids. Skull and upper cervical spine: Normal marrow signal. Sinuses/Orbits: Negative. IMPRESSION: No acute finding.  No occult intracranial injury. Electronically Signed   By: Tiburcio Pea M.D.   On: 10/20/2023 09:39   MR Cervical Spine Wo Contrast Result Date: 10/20/2023 CLINICAL DATA:  Back trauma with abnormal neuro exam. New onset weakness  in the right leg. Fall tonight. EXAM: MRI CERVICAL AND THORACIC SPINE WITHOUT CONTRAST TECHNIQUE: Multiplanar and multiecho pulse sequences of the cervical spine, to include the craniocervical junction and cervicothoracic junction, and the thoracic spine, were obtained without intravenous contrast. COMPARISON:  Thoracic and lumbar CT from earlier today FINDINGS: MRI CERVICAL SPINE FINDINGS Alignment: Reversal of cervical lordosis, degenerative. No traumatic malalignment. Vertebrae: No fracture, evidence of discitis, or bone lesion. Cord: Normal signal and morphology. Posterior Fossa, vertebral arteries, paraspinal tissues: Negative for swelling or masslike appearance Disc levels: C2-3: Spurring on the right with facet ankylosis and fatty marrow conversion. C3-4: Mild facet spurring and disc bulging. C4-5: Disc height loss and bulging with small central protrusion. Negative facets. C5-6: Intervertebral collapse and ankylosis. Posterior ridging contacts the cord but no compression, posterior CSF space is  well maintained. C6-7: Unremarkable. C7-T1:Unremarkable. MRI THORACIC SPINE FINDINGS Alignment:  Physiologic. Vertebrae: Marrow edematous signal in the T10 body with horizontal hypointense fracture plane following the mildly depressed superior endplate. There have been remote and healed compression fractures of T9 and L1. T3-4 non segmentation. Cord:  Normal signal and morphology. Paraspinal and other soft tissues: Negative. Disc levels: Disc desiccation diffusely. No herniation, facet spurring, or neural impingement. IMPRESSION: Cervical spine: 1. No acute finding. 2. 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 No significant spinal stenosis. Thoracic spine: 1. Acute T10 compression fracture with mild height loss. 2. Remote and healed T9 and L1 compression fractures. Electronically Signed   By: Tiburcio Pea M.D.   On: 10/20/2023 05:16   MR THORACIC SPINE WO CONTRAST Result Date:  10/20/2023 CLINICAL DATA:  Back trauma with abnormal neuro exam. New onset weakness in the right leg. Fall tonight. EXAM: MRI CERVICAL AND THORACIC SPINE WITHOUT CONTRAST TECHNIQUE: Multiplanar and multiecho pulse sequences of the cervical spine, to include the craniocervical junction and cervicothoracic junction, and the thoracic spine, were obtained without intravenous contrast. COMPARISON:  Thoracic and lumbar CT from earlier today FINDINGS: MRI CERVICAL SPINE FINDINGS Alignment: Reversal of cervical lordosis, degenerative. No traumatic malalignment. Vertebrae: No fracture, evidence of discitis, or bone lesion. Cord: Normal signal and morphology. Posterior Fossa, vertebral arteries, paraspinal tissues: Negative for swelling or masslike appearance Disc levels: C2-3: Spurring on the right with facet ankylosis and fatty marrow conversion. C3-4: Mild facet spurring and disc bulging. C4-5: Disc height loss and bulging with small central protrusion. Negative facets. C5-6: Intervertebral collapse and ankylosis. Posterior ridging contacts the cord but no compression, posterior CSF space is well maintained. C6-7: Unremarkable. C7-T1:Unremarkable. MRI THORACIC SPINE FINDINGS Alignment:  Physiologic. Vertebrae: Marrow edematous signal in the T10 body with horizontal hypointense fracture plane following the mildly depressed superior endplate. There have been remote and healed compression fractures of T9 and L1. T3-4 non segmentation. Cord:  Normal signal and morphology. Paraspinal and other soft tissues: Negative. Disc levels: Disc desiccation diffusely. No herniation, facet spurring, or neural impingement. IMPRESSION: Cervical spine: 1. No acute finding. 2. 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 No significant spinal stenosis. Thoracic spine: 1. Acute T10 compression fracture with mild height loss. 2. Remote and healed T9 and L1 compression fractures. Electronically Signed   By: Tiburcio Pea M.D.    On: 10/20/2023 05:16   DG Femur Min 2 Views Right Result Date: 10/20/2023 CLINICAL DATA:  Fall, right leg pain EXAM: RIGHT FEMUR 2 VIEWS COMPARISON:  None Available. FINDINGS: There is no evidence of fracture or other focal bone lesions. Soft tissues are unremarkable. IMPRESSION: Negative. Electronically Signed   By: Helyn Numbers M.D.   On: 10/20/2023 04:27   CT Head Wo Contrast Result Date: 10/20/2023 CLINICAL DATA:  Blunt chest trauma from fall. Right head and neck pain. Loss consciousness during the fall. History of ALS. Nonverbal at baseline. Weakness in legs post fall. EXAM: CT HEAD WITHOUT CONTRAST CT CERVICAL SPINE WITHOUT CONTRAST CT CHEST, ABDOMEN AND PELVIS WITHOUT CONTRAST CT Thoracic and Lumbar spine  Contrast TECHNIQUE: Multiplanar CT images of the thoracic and lumbar spine were reconstructed from contemporary CT of the Chest, Abdomen, and Pelvis. Contiguous axial images were obtained from the base of the skull through the vertex without intravenous contrast. Multidetector CT imaging of the cervical spine was performed without intravenous contrast. Multiplanar CT image reconstructions were also generated. Multidetector CT imaging of the chest, abdomen and  pelvis was performed following the standard protocol without IV contrast. RADIATION DOSE REDUCTION: This exam was performed according to the departmental dose-optimization program which includes automated exposure control, adjustment of the mA and/or kV according to patient size and/or use of iterative reconstruction technique. COMPARISON:  CT 09/13/2023 and 02/28/2023 FINDINGS: CT HEAD FINDINGS Brain: No intracranial hemorrhage, mass effect, or evidence of acute infarct. No hydrocephalus. No extra-axial fluid collection. Age-commensurate cerebral atrophy and chronic small vessel ischemic disease. Vascular: No hyperdense vessel. Intracranial arterial calcification. Skull: No fracture or focal lesion. Sinuses/Orbits: No acute finding. Other:  None. CT CERVICAL FINDINGS Alignment: No evidence of traumatic listhesis. Skull base and vertebrae: No acute fracture. Soft tissues and spinal canal: No prevertebral fluid or swelling. No visible canal hematoma. Disc levels: Unchanged spondylosis and disc space height loss greatest at C5-C6 where it is advanced. No severe spinal canal narrowing. Other: None. CT CHEST FINDINGS Cardiovascular: Normal heart size. No pericardial effusion. Mild aortic atherosclerotic calcification. Mediastinum/Nodes: Trachea and esophagus are unremarkable. No mediastinal adenopathy. Lungs/Pleura: Biapical pleural-parenchymal scarring. Bibasilar atelectasis/scarring. No pleural effusion or pneumothorax. Musculoskeletal: No acute fracture. CT ABDOMEN AND PELVIS FINDINGS Hepatobiliary: No acute abnormality. Pancreas: Unremarkable. Spleen: Unremarkable. Adrenals/Urinary Tract: Normal adrenal glands. Nonobstructing left nephrolithiasis. No urinary calculi or hydronephrosis. Unremarkable bladder. Stomach/Bowel: Normal caliber large and small bowel. No bowel wall thickening. Percutaneous gastrostomy tube in the stomach. Unremarkable appendix. Vascular/Lymphatic: No significant vascular findings are present. No enlarged abdominal or pelvic lymph nodes. Reproductive: Unremarkable. Other: No free intraperitoneal fluid or air. Musculoskeletal: No acute fracture in the pelvis. CT THORACIC SPINE FINDINGS Alignment: No evidence of traumatic listhesis. Vertebrae: Age indeterminate superior endplate compression fracture of T10, new since 02/28/2023. There is approximately 10-20% vertebral body height loss. No retropulsion. No additional fractures. Paraspinal and other soft tissues: See above. Disc levels: Mild multilevel spondylosis. No severe spinal canal or neural foraminal narrowing. CT LUMBAR SPINE FINDINGS Segmentation: 5 lumbar type vertebrae. Alignment: No evidence of traumatic listhesis. Vertebrae: Chronic superior endplate compression fracture  of L1. Vertebroplasty of L2. No acute fracture. Paraspinal and other soft tissues: See above. Disc levels: Intervertebral disc space height is maintained. No severe spinal canal or neural foraminal narrowing. IMPRESSION: 1. No acute intracranial abnormality. 2. No acute fracture in the cervical spine. 3. No acute abnormality in the chest, abdomen, or pelvis. 4. Age indeterminate superior endplate compression fracture of T10. 5. No acute fracture in the lumbar spine. Aortic Atherosclerosis (ICD10-I70.0). Electronically Signed   By: Minerva Fester M.D.   On: 10/20/2023 03:34   CT Cervical Spine Wo Contrast Result Date: 10/20/2023 CLINICAL DATA:  Blunt chest trauma from fall. Right head and neck pain. Loss consciousness during the fall. History of ALS. Nonverbal at baseline. Weakness in legs post fall. EXAM: CT HEAD WITHOUT CONTRAST CT CERVICAL SPINE WITHOUT CONTRAST CT CHEST, ABDOMEN AND PELVIS WITHOUT CONTRAST CT Thoracic and Lumbar spine  Contrast TECHNIQUE: Multiplanar CT images of the thoracic and lumbar spine were reconstructed from contemporary CT of the Chest, Abdomen, and Pelvis. Contiguous axial images were obtained from the base of the skull through the vertex without intravenous contrast. Multidetector CT imaging of the cervical spine was performed without intravenous contrast. Multiplanar CT image reconstructions were also generated. Multidetector CT imaging of the chest, abdomen and pelvis was performed following the standard protocol without IV contrast. RADIATION DOSE REDUCTION: This exam was performed according to the departmental dose-optimization program which includes automated exposure control, adjustment of the mA and/or kV according to patient size  and/or use of iterative reconstruction technique. COMPARISON:  CT 09/13/2023 and 02/28/2023 FINDINGS: CT HEAD FINDINGS Brain: No intracranial hemorrhage, mass effect, or evidence of acute infarct. No hydrocephalus. No extra-axial fluid collection.  Age-commensurate cerebral atrophy and chronic small vessel ischemic disease. Vascular: No hyperdense vessel. Intracranial arterial calcification. Skull: No fracture or focal lesion. Sinuses/Orbits: No acute finding. Other: None. CT CERVICAL FINDINGS Alignment: No evidence of traumatic listhesis. Skull base and vertebrae: No acute fracture. Soft tissues and spinal canal: No prevertebral fluid or swelling. No visible canal hematoma. Disc levels: Unchanged spondylosis and disc space height loss greatest at C5-C6 where it is advanced. No severe spinal canal narrowing. Other: None. CT CHEST FINDINGS Cardiovascular: Normal heart size. No pericardial effusion. Mild aortic atherosclerotic calcification. Mediastinum/Nodes: Trachea and esophagus are unremarkable. No mediastinal adenopathy. Lungs/Pleura: Biapical pleural-parenchymal scarring. Bibasilar atelectasis/scarring. No pleural effusion or pneumothorax. Musculoskeletal: No acute fracture. CT ABDOMEN AND PELVIS FINDINGS Hepatobiliary: No acute abnormality. Pancreas: Unremarkable. Spleen: Unremarkable. Adrenals/Urinary Tract: Normal adrenal glands. Nonobstructing left nephrolithiasis. No urinary calculi or hydronephrosis. Unremarkable bladder. Stomach/Bowel: Normal caliber large and small bowel. No bowel wall thickening. Percutaneous gastrostomy tube in the stomach. Unremarkable appendix. Vascular/Lymphatic: No significant vascular findings are present. No enlarged abdominal or pelvic lymph nodes. Reproductive: Unremarkable. Other: No free intraperitoneal fluid or air. Musculoskeletal: No acute fracture in the pelvis. CT THORACIC SPINE FINDINGS Alignment: No evidence of traumatic listhesis. Vertebrae: Age indeterminate superior endplate compression fracture of T10, new since 02/28/2023. There is approximately 10-20% vertebral body height loss. No retropulsion. No additional fractures. Paraspinal and other soft tissues: See above. Disc levels: Mild multilevel spondylosis.  No severe spinal canal or neural foraminal narrowing. CT LUMBAR SPINE FINDINGS Segmentation: 5 lumbar type vertebrae. Alignment: No evidence of traumatic listhesis. Vertebrae: Chronic superior endplate compression fracture of L1. Vertebroplasty of L2. No acute fracture. Paraspinal and other soft tissues: See above. Disc levels: Intervertebral disc space height is maintained. No severe spinal canal or neural foraminal narrowing. IMPRESSION: 1. No acute intracranial abnormality. 2. No acute fracture in the cervical spine. 3. No acute abnormality in the chest, abdomen, or pelvis. 4. Age indeterminate superior endplate compression fracture of T10. 5. No acute fracture in the lumbar spine. Aortic Atherosclerosis (ICD10-I70.0). Electronically Signed   By: Minerva Fester M.D.   On: 10/20/2023 03:34   CT T-SPINE NO CHARGE Result Date: 10/20/2023 CLINICAL DATA:  Blunt chest trauma from fall. Right head and neck pain. Loss consciousness during the fall. History of ALS. Nonverbal at baseline. Weakness in legs post fall. EXAM: CT HEAD WITHOUT CONTRAST CT CERVICAL SPINE WITHOUT CONTRAST CT CHEST, ABDOMEN AND PELVIS WITHOUT CONTRAST CT Thoracic and Lumbar spine  Contrast TECHNIQUE: Multiplanar CT images of the thoracic and lumbar spine were reconstructed from contemporary CT of the Chest, Abdomen, and Pelvis. Contiguous axial images were obtained from the base of the skull through the vertex without intravenous contrast. Multidetector CT imaging of the cervical spine was performed without intravenous contrast. Multiplanar CT image reconstructions were also generated. Multidetector CT imaging of the chest, abdomen and pelvis was performed following the standard protocol without IV contrast. RADIATION DOSE REDUCTION: This exam was performed according to the departmental dose-optimization program which includes automated exposure control, adjustment of the mA and/or kV according to patient size and/or use of iterative  reconstruction technique. COMPARISON:  CT 09/13/2023 and 02/28/2023 FINDINGS: CT HEAD FINDINGS Brain: No intracranial hemorrhage, mass effect, or evidence of acute infarct. No hydrocephalus. No extra-axial fluid collection. Age-commensurate cerebral atrophy and chronic  small vessel ischemic disease. Vascular: No hyperdense vessel. Intracranial arterial calcification. Skull: No fracture or focal lesion. Sinuses/Orbits: No acute finding. Other: None. CT CERVICAL FINDINGS Alignment: No evidence of traumatic listhesis. Skull base and vertebrae: No acute fracture. Soft tissues and spinal canal: No prevertebral fluid or swelling. No visible canal hematoma. Disc levels: Unchanged spondylosis and disc space height loss greatest at C5-C6 where it is advanced. No severe spinal canal narrowing. Other: None. CT CHEST FINDINGS Cardiovascular: Normal heart size. No pericardial effusion. Mild aortic atherosclerotic calcification. Mediastinum/Nodes: Trachea and esophagus are unremarkable. No mediastinal adenopathy. Lungs/Pleura: Biapical pleural-parenchymal scarring. Bibasilar atelectasis/scarring. No pleural effusion or pneumothorax. Musculoskeletal: No acute fracture. CT ABDOMEN AND PELVIS FINDINGS Hepatobiliary: No acute abnormality. Pancreas: Unremarkable. Spleen: Unremarkable. Adrenals/Urinary Tract: Normal adrenal glands. Nonobstructing left nephrolithiasis. No urinary calculi or hydronephrosis. Unremarkable bladder. Stomach/Bowel: Normal caliber large and small bowel. No bowel wall thickening. Percutaneous gastrostomy tube in the stomach. Unremarkable appendix. Vascular/Lymphatic: No significant vascular findings are present. No enlarged abdominal or pelvic lymph nodes. Reproductive: Unremarkable. Other: No free intraperitoneal fluid or air. Musculoskeletal: No acute fracture in the pelvis. CT THORACIC SPINE FINDINGS Alignment: No evidence of traumatic listhesis. Vertebrae: Age indeterminate superior endplate compression  fracture of T10, new since 02/28/2023. There is approximately 10-20% vertebral body height loss. No retropulsion. No additional fractures. Paraspinal and other soft tissues: See above. Disc levels: Mild multilevel spondylosis. No severe spinal canal or neural foraminal narrowing. CT LUMBAR SPINE FINDINGS Segmentation: 5 lumbar type vertebrae. Alignment: No evidence of traumatic listhesis. Vertebrae: Chronic superior endplate compression fracture of L1. Vertebroplasty of L2. No acute fracture. Paraspinal and other soft tissues: See above. Disc levels: Intervertebral disc space height is maintained. No severe spinal canal or neural foraminal narrowing. IMPRESSION: 1. No acute intracranial abnormality. 2. No acute fracture in the cervical spine. 3. No acute abnormality in the chest, abdomen, or pelvis. 4. Age indeterminate superior endplate compression fracture of T10. 5. No acute fracture in the lumbar spine. Aortic Atherosclerosis (ICD10-I70.0). Electronically Signed   By: Minerva Fester M.D.   On: 10/20/2023 03:34   CT L-SPINE NO CHARGE Result Date: 10/20/2023 CLINICAL DATA:  Blunt chest trauma from fall. Right head and neck pain. Loss consciousness during the fall. History of ALS. Nonverbal at baseline. Weakness in legs post fall. EXAM: CT HEAD WITHOUT CONTRAST CT CERVICAL SPINE WITHOUT CONTRAST CT CHEST, ABDOMEN AND PELVIS WITHOUT CONTRAST CT Thoracic and Lumbar spine  Contrast TECHNIQUE: Multiplanar CT images of the thoracic and lumbar spine were reconstructed from contemporary CT of the Chest, Abdomen, and Pelvis. Contiguous axial images were obtained from the base of the skull through the vertex without intravenous contrast. Multidetector CT imaging of the cervical spine was performed without intravenous contrast. Multiplanar CT image reconstructions were also generated. Multidetector CT imaging of the chest, abdomen and pelvis was performed following the standard protocol without IV contrast. RADIATION  DOSE REDUCTION: This exam was performed according to the departmental dose-optimization program which includes automated exposure control, adjustment of the mA and/or kV according to patient size and/or use of iterative reconstruction technique. COMPARISON:  CT 09/13/2023 and 02/28/2023 FINDINGS: CT HEAD FINDINGS Brain: No intracranial hemorrhage, mass effect, or evidence of acute infarct. No hydrocephalus. No extra-axial fluid collection. Age-commensurate cerebral atrophy and chronic small vessel ischemic disease. Vascular: No hyperdense vessel. Intracranial arterial calcification. Skull: No fracture or focal lesion. Sinuses/Orbits: No acute finding. Other: None. CT CERVICAL FINDINGS Alignment: No evidence of traumatic listhesis. Skull base and vertebrae: No acute fracture.  Soft tissues and spinal canal: No prevertebral fluid or swelling. No visible canal hematoma. Disc levels: Unchanged spondylosis and disc space height loss greatest at C5-C6 where it is advanced. No severe spinal canal narrowing. Other: None. CT CHEST FINDINGS Cardiovascular: Normal heart size. No pericardial effusion. Mild aortic atherosclerotic calcification. Mediastinum/Nodes: Trachea and esophagus are unremarkable. No mediastinal adenopathy. Lungs/Pleura: Biapical pleural-parenchymal scarring. Bibasilar atelectasis/scarring. No pleural effusion or pneumothorax. Musculoskeletal: No acute fracture. CT ABDOMEN AND PELVIS FINDINGS Hepatobiliary: No acute abnormality. Pancreas: Unremarkable. Spleen: Unremarkable. Adrenals/Urinary Tract: Normal adrenal glands. Nonobstructing left nephrolithiasis. No urinary calculi or hydronephrosis. Unremarkable bladder. Stomach/Bowel: Normal caliber large and small bowel. No bowel wall thickening. Percutaneous gastrostomy tube in the stomach. Unremarkable appendix. Vascular/Lymphatic: No significant vascular findings are present. No enlarged abdominal or pelvic lymph nodes. Reproductive: Unremarkable. Other: No  free intraperitoneal fluid or air. Musculoskeletal: No acute fracture in the pelvis. CT THORACIC SPINE FINDINGS Alignment: No evidence of traumatic listhesis. Vertebrae: Age indeterminate superior endplate compression fracture of T10, new since 02/28/2023. There is approximately 10-20% vertebral body height loss. No retropulsion. No additional fractures. Paraspinal and other soft tissues: See above. Disc levels: Mild multilevel spondylosis. No severe spinal canal or neural foraminal narrowing. CT LUMBAR SPINE FINDINGS Segmentation: 5 lumbar type vertebrae. Alignment: No evidence of traumatic listhesis. Vertebrae: Chronic superior endplate compression fracture of L1. Vertebroplasty of L2. No acute fracture. Paraspinal and other soft tissues: See above. Disc levels: Intervertebral disc space height is maintained. No severe spinal canal or neural foraminal narrowing. IMPRESSION: 1. No acute intracranial abnormality. 2. No acute fracture in the cervical spine. 3. No acute abnormality in the chest, abdomen, or pelvis. 4. Age indeterminate superior endplate compression fracture of T10. 5. No acute fracture in the lumbar spine. Aortic Atherosclerosis (ICD10-I70.0). Electronically Signed   By: Minerva Fester M.D.   On: 10/20/2023 03:34   CT CHEST ABDOMEN PELVIS WO CONTRAST Result Date: 10/20/2023 CLINICAL DATA:  Blunt chest trauma from fall. Right head and neck pain. Loss consciousness during the fall. History of ALS. Nonverbal at baseline. Weakness in legs post fall. EXAM: CT HEAD WITHOUT CONTRAST CT CERVICAL SPINE WITHOUT CONTRAST CT CHEST, ABDOMEN AND PELVIS WITHOUT CONTRAST CT Thoracic and Lumbar spine  Contrast TECHNIQUE: Multiplanar CT images of the thoracic and lumbar spine were reconstructed from contemporary CT of the Chest, Abdomen, and Pelvis. Contiguous axial images were obtained from the base of the skull through the vertex without intravenous contrast. Multidetector CT imaging of the cervical spine was  performed without intravenous contrast. Multiplanar CT image reconstructions were also generated. Multidetector CT imaging of the chest, abdomen and pelvis was performed following the standard protocol without IV contrast. RADIATION DOSE REDUCTION: This exam was performed according to the departmental dose-optimization program which includes automated exposure control, adjustment of the mA and/or kV according to patient size and/or use of iterative reconstruction technique. COMPARISON:  CT 09/13/2023 and 02/28/2023 FINDINGS: CT HEAD FINDINGS Brain: No intracranial hemorrhage, mass effect, or evidence of acute infarct. No hydrocephalus. No extra-axial fluid collection. Age-commensurate cerebral atrophy and chronic small vessel ischemic disease. Vascular: No hyperdense vessel. Intracranial arterial calcification. Skull: No fracture or focal lesion. Sinuses/Orbits: No acute finding. Other: None. CT CERVICAL FINDINGS Alignment: No evidence of traumatic listhesis. Skull base and vertebrae: No acute fracture. Soft tissues and spinal canal: No prevertebral fluid or swelling. No visible canal hematoma. Disc levels: Unchanged spondylosis and disc space height loss greatest at C5-C6 where it is advanced. No severe spinal canal narrowing. Other: None.  CT CHEST FINDINGS Cardiovascular: Normal heart size. No pericardial effusion. Mild aortic atherosclerotic calcification. Mediastinum/Nodes: Trachea and esophagus are unremarkable. No mediastinal adenopathy. Lungs/Pleura: Biapical pleural-parenchymal scarring. Bibasilar atelectasis/scarring. No pleural effusion or pneumothorax. Musculoskeletal: No acute fracture. CT ABDOMEN AND PELVIS FINDINGS Hepatobiliary: No acute abnormality. Pancreas: Unremarkable. Spleen: Unremarkable. Adrenals/Urinary Tract: Normal adrenal glands. Nonobstructing left nephrolithiasis. No urinary calculi or hydronephrosis. Unremarkable bladder. Stomach/Bowel: Normal caliber large and small bowel. No bowel  wall thickening. Percutaneous gastrostomy tube in the stomach. Unremarkable appendix. Vascular/Lymphatic: No significant vascular findings are present. No enlarged abdominal or pelvic lymph nodes. Reproductive: Unremarkable. Other: No free intraperitoneal fluid or air. Musculoskeletal: No acute fracture in the pelvis. CT THORACIC SPINE FINDINGS Alignment: No evidence of traumatic listhesis. Vertebrae: Age indeterminate superior endplate compression fracture of T10, new since 02/28/2023. There is approximately 10-20% vertebral body height loss. No retropulsion. No additional fractures. Paraspinal and other soft tissues: See above. Disc levels: Mild multilevel spondylosis. No severe spinal canal or neural foraminal narrowing. CT LUMBAR SPINE FINDINGS Segmentation: 5 lumbar type vertebrae. Alignment: No evidence of traumatic listhesis. Vertebrae: Chronic superior endplate compression fracture of L1. Vertebroplasty of L2. No acute fracture. Paraspinal and other soft tissues: See above. Disc levels: Intervertebral disc space height is maintained. No severe spinal canal or neural foraminal narrowing. IMPRESSION: 1. No acute intracranial abnormality. 2. No acute fracture in the cervical spine. 3. No acute abnormality in the chest, abdomen, or pelvis. 4. Age indeterminate superior endplate compression fracture of T10. 5. No acute fracture in the lumbar spine. Aortic Atherosclerosis (ICD10-I70.0). Electronically Signed   By: Minerva Fester M.D.   On: 10/20/2023 03:34    Microbiology: Results for orders placed or performed during the hospital encounter of 11/12/21  Resp Panel by RT-PCR (Flu A&B, Covid) Nasopharyngeal Swab     Status: Abnormal   Collection Time: 11/12/21 11:02 PM   Specimen: Nasopharyngeal Swab; Nasopharyngeal(NP) swabs in vial transport medium  Result Value Ref Range Status   SARS Coronavirus 2 by RT PCR POSITIVE (A) NEGATIVE Final    Comment: (NOTE) SARS-CoV-2 target nucleic acids are  DETECTED.  The SARS-CoV-2 RNA is generally detectable in upper respiratory specimens during the acute phase of infection. Positive results are indicative of the presence of the identified virus, but do not rule out bacterial infection or co-infection with other pathogens not detected by the test. Clinical correlation with patient history and other diagnostic information is necessary to determine patient infection status. The expected result is Negative.  Fact Sheet for Patients: BloggerCourse.com  Fact Sheet for Healthcare Providers: SeriousBroker.it  This test is not yet approved or cleared by the [place] FDA and  has been authorized for detection and/or diagnosis of SARS-CoV-2 by FDA under an Emergency Use Authorization (EUA).  This EUA will remain in effect (meaning this test can be used) for the duration of  the COVID-19 declaration under Section 564(b)(1) of the A ct, 21 U.S.C. section 360bbb-3(b)(1), unless the authorization is terminated or revoked sooner.     Influenza A by PCR NEGATIVE NEGATIVE Final   Influenza B by PCR NEGATIVE NEGATIVE Final    Comment: (NOTE) The Xpert Xpress SARS-CoV-2/FLU/RSV plus assay is intended as an aid in the diagnosis of influenza from Nasopharyngeal swab specimens and should not be used as a sole basis for treatment. Nasal washings and aspirates are unacceptable for Xpert Xpress SARS-CoV-2/FLU/RSV testing.  Fact Sheet for Patients: BloggerCourse.com  Fact Sheet for Healthcare Providers: SeriousBroker.it  This test is not yet approved  or cleared by the [place] and has been authorized for detection and/or diagnosis of SARS-CoV-2 by FDA under an Emergency Use Authorization (EUA). This EUA will remain in effect (meaning this test can be used) for the duration of the COVID-19 declaration under Section 564(b)(1) of the Act, 21  U.S.C. section 360bbb-3(b)(1), unless the authorization is terminated or revoked.  Performed at [facility], [address] [place], [place] [address]   MRSA Next Gen by PCR, Nasal     Status: None   Collection Time: 11/13/21 12:15 PM   Specimen: Nasal Mucosa; Nasal Swab  Result Value Ref Range Status   MRSA by PCR Next Gen NOT DETECTED NOT DETECTED Final    Comment: (NOTE) The GeneXpert MRSA Assay (FDA approved for NASAL specimens only), is one component of a comprehensive MRSA colonization surveillance program. It is not intended to diagnose MRSA infection nor to guide or monitor treatment for MRSA infections. Test performance is not FDA approved in patients less than 68 years old. Performed at [facility], [address] [place], [place] [address]     Labs: CBC: Recent Labs  Lab 10/20/23 0151 10/21/23 0339  WBC 6.3 6.1  HGB 13.7 12.5*  HCT 40.4 37.2*  MCV 101.5* 102.5*  PLT 177 179   Basic Metabolic Panel: Recent Labs  Lab 10/20/23 0151 10/21/23 0339  NA 138 139  K 4.0 4.6  CL 105 109  CO2 25 22  GLUCOSE 117* 109*  BUN 26* 15  CREATININE 0.59* 0.61  CALCIUM 9.3 8.9   Liver Function Tests: Recent Labs  Lab 10/20/23 0151 10/21/23 0339  AST 28 31  ALT 35 26  ALKPHOS 110 109  BILITOT 0.3 0.9  PROT 6.7 6.3*  ALBUMIN 3.9 3.6   CBG: Recent Labs  Lab 10/22/23 1328 10/22/23 1637 10/22/23 2011 10/23/23 0003 10/23/23 0751  GLUCAP 128* 139* 139* 128* 105*    Discharge time spent: greater than 30 minutes.  Signed: Lucile Shutters, MD [facility] 10/23/2023

## 2023-10-25 ENCOUNTER — Telehealth: Payer: Self-pay

## 2023-10-25 NOTE — Telephone Encounter (Signed)
-----   Message from Susanne Borders sent at 10/25/2023  1:49 PM EST ----- Regarding: RE: hospital follow up I haven't. I didn't realize he was discharged. I honestly don't know the plan since I never saw him. He can probably follow up with xrays in one of the APP clinics in the next couple of weeks ----- Message ----- From: Sharlot Gowda, RN Sent: 10/25/2023   1:47 PM EST To: Susanne Borders, PA Subject: hospital follow up                             Looks like he was discharged without the xrays. I don't see an appointment scheduled w/ our office. Did you already send a message to the front desk about an appointment for him?  If not, what is the follow up plan for him?  Thanks!

## 2023-10-25 NOTE — Telephone Encounter (Signed)
Dr Emogene Morgan saw as a consult on 10/21/23. Per Dr Emogene Morgan: "ALS and non verbal. Had a fall at home 3 days ago.  t10 compression fracture.  Fracture did not look concerning.  Recommended TLSO brace and xrays when standing".  He did not have xrays prior to being discharged.  Please offer a new patient appointment with a PA in the next couple of weeks for T10 fracture. Will need xrays. *He is mute (doesn't speak), writes on a white board to communicate*

## 2023-10-25 NOTE — Telephone Encounter (Signed)
Recoding on his number "Call can not be completed at this time" Mother's number who is also the emergency contact "Call cannot be completed at this time"

## 2023-10-29 NOTE — Telephone Encounter (Signed)
I called his number but it was a recording "Call can not be completed at this time" I called his emergency contact who is also his mom Ollen Gross, she said that there is no way she can bring him to the office. He stays in bed all day. He has difficulty walking. He has a hospice nurse that comes out everyday. She will discuss this with him and the nurse to see if they are able to bring him out.

## 2023-10-30 ENCOUNTER — Emergency Department

## 2023-10-30 ENCOUNTER — Emergency Department
Admission: EM | Admit: 2023-10-30 | Discharge: 2023-10-30 | Discharge status: Against Medical Advice | Attending: Emergency Medicine | Admitting: Emergency Medicine

## 2023-10-30 ENCOUNTER — Other Ambulatory Visit: Payer: Self-pay

## 2023-10-30 DIAGNOSIS — M549 Dorsalgia, unspecified: Secondary | ICD-10-CM | POA: Diagnosis not present

## 2023-10-30 DIAGNOSIS — Z5321 Procedure and treatment not carried out due to patient leaving prior to being seen by health care provider: Secondary | ICD-10-CM | POA: Insufficient documentation

## 2023-10-30 DIAGNOSIS — R109 Unspecified abdominal pain: Secondary | ICD-10-CM | POA: Insufficient documentation

## 2023-10-30 DIAGNOSIS — W19XXXA Unspecified fall, initial encounter: Secondary | ICD-10-CM | POA: Insufficient documentation

## 2023-10-30 LAB — COMPREHENSIVE METABOLIC PANEL
ALT: 89 U/L — ABNORMAL HIGH (ref 0–44)
AST: 43 U/L — ABNORMAL HIGH (ref 15–41)
Albumin: 3.7 g/dL (ref 3.5–5.0)
Alkaline Phosphatase: 105 U/L (ref 38–126)
Anion gap: 11 (ref 5–15)
BUN: 16 mg/dL (ref 8–23)
CO2: 22 mmol/L (ref 22–32)
Calcium: 9.4 mg/dL (ref 8.9–10.3)
Chloride: 105 mmol/L (ref 98–111)
Creatinine, Ser: 0.52 mg/dL — ABNORMAL LOW (ref 0.61–1.24)
GFR, Estimated: >60 mL/min (ref >60)
Glucose, Bld: 108 mg/dL — ABNORMAL HIGH (ref 70–99)
Potassium: 4.3 mmol/L (ref 3.5–5.1)
Sodium: 138 mmol/L (ref 135–145)
Total Bilirubin: 0.6 mg/dL (ref 0.0–1.2)
Total Protein: 6.8 g/dL (ref 6.5–8.1)

## 2023-10-30 LAB — CBC WITH DIFFERENTIAL/PLATELET
Abs Immature Granulocytes: 0.01 10*3/uL (ref 0.00–0.07)
Basophils Absolute: 0 10*3/uL (ref 0.0–0.1)
Basophils Relative: 0 %
Eosinophils Absolute: 0.1 10*3/uL (ref 0.0–0.5)
Eosinophils Relative: 2 %
HCT: 37.6 % — ABNORMAL LOW (ref 39.0–52.0)
Hemoglobin: 12.7 g/dL — ABNORMAL LOW (ref 13.0–17.0)
Immature Granulocytes: 0 %
Lymphocytes Relative: 33 %
Lymphs Abs: 1.6 10*3/uL (ref 0.7–4.0)
MCH: 34.3 pg — ABNORMAL HIGH (ref 26.0–34.0)
MCHC: 33.8 g/dL (ref 30.0–36.0)
MCV: 101.6 fL — ABNORMAL HIGH (ref 80.0–100.0)
Monocytes Absolute: 0.5 10*3/uL (ref 0.1–1.0)
Monocytes Relative: 10 %
Neutro Abs: 2.7 10*3/uL (ref 1.7–7.7)
Neutrophils Relative %: 55 %
Platelets: 339 10*3/uL (ref 150–400)
RBC: 3.7 MIL/uL — ABNORMAL LOW (ref 4.22–5.81)
RDW: 12.7 % (ref 11.5–15.5)
WBC: 5 10*3/uL (ref 4.0–10.5)
nRBC: 0 % (ref 0.0–0.2)

## 2023-10-30 MED ORDER — OXYCODONE-ACETAMINOPHEN 5-325 MG PO TABS
1.0000 | ORAL_TABLET | Freq: Once | ORAL | Status: DC
  Filled 2023-10-30: qty 1

## 2023-10-30 NOTE — ED Triage Notes (Addendum)
Pt to ED via EMS from home, pt fell last Saturday and per ems has hairline fx in back, pt began to have right side flank pain, hx kidney stones but pt reports pain is worse than kidney stone. Pt is on 2L at baseline as needed. Per ems R side breath sound diminished. Pt has hx ALS and is mute at baseline

## 2023-10-30 NOTE — ED Provider Triage Note (Signed)
Emergency Medicine Provider Triage Evaluation Note  Shawn [name], a 63 y.o. male  was evaluated in triage.  Pt complains of ongoing back pain. He presents via EMS from home, reporting ongoing pain due to fall last week, resulting in a stable, mild compression fracture of T10. He is reporting right flank tenderness, with a history of kidney stones. Patient communicates by writing due to ALS.   Review of Systems  Positive: Flank pain Negative: FCS, NVD  Physical Exam  BP 127/81   Pulse 83   Temp 98.3 F (36.8 C) (Oral)   Resp 16   Ht 5\' 10"  (1.778 m)   Wt 65.3 kg   SpO2 98%   BMI 20.65 kg/m  Gen:   Awake, no distress  NAD Resp:  Normal effort CTA MSK:   Moves extremities without difficulty  Other:    Medical Decision Making  Medically screening exam initiated at 10:36 PM.  Appropriate orders placed.  OLLIVER BOYADJIAN was informed that the remainder of the evaluation will be completed by another provider, this initial triage assessment does not replace that evaluation, and the importance of remaining in the ED until their evaluation is complete.  Patient to the ED for evaluation of right flank pain with recent acute T10 compression fracture. No new injury noted.    Lissa Hoard, PA-C 10/30/23 2325

## 2023-11-01 ENCOUNTER — Encounter: Payer: Medicare Other | Admitting: Student in an Organized Health Care Education/Training Program

## 2023-11-05 ENCOUNTER — Inpatient Hospital Stay
Admission: EM | Admit: 2023-11-05 | Discharge: 2023-11-09 | DRG: 871 | Disposition: A | Discharge status: Hospice | Payer: Medicare Other | Attending: Family Medicine | Admitting: Family Medicine

## 2023-11-05 ENCOUNTER — Other Ambulatory Visit: Payer: Self-pay

## 2023-11-05 ENCOUNTER — Emergency Department: Payer: Medicare Other

## 2023-11-05 DIAGNOSIS — J690 Pneumonitis due to inhalation of food and vomit: Secondary | ICD-10-CM | POA: Diagnosis present

## 2023-11-05 DIAGNOSIS — Z515 Encounter for palliative care: Secondary | ICD-10-CM | POA: Diagnosis not present

## 2023-11-05 DIAGNOSIS — Z8616 Personal history of COVID-19: Secondary | ICD-10-CM | POA: Diagnosis not present

## 2023-11-05 DIAGNOSIS — J189 Pneumonia, unspecified organism: Secondary | ICD-10-CM | POA: Diagnosis present

## 2023-11-05 DIAGNOSIS — Z1152 Encounter for screening for COVID-19: Secondary | ICD-10-CM | POA: Diagnosis not present

## 2023-11-05 DIAGNOSIS — I2510 Atherosclerotic heart disease of native coronary artery without angina pectoris: Secondary | ICD-10-CM | POA: Diagnosis present

## 2023-11-05 DIAGNOSIS — Z87442 Personal history of urinary calculi: Secondary | ICD-10-CM

## 2023-11-05 DIAGNOSIS — S22080S Wedge compression fracture of T11-T12 vertebra, sequela: Secondary | ICD-10-CM | POA: Diagnosis not present

## 2023-11-05 DIAGNOSIS — Z681 Body mass index (BMI) 19 or less, adult: Secondary | ICD-10-CM | POA: Diagnosis not present

## 2023-11-05 DIAGNOSIS — R64 Cachexia: Secondary | ICD-10-CM | POA: Diagnosis present

## 2023-11-05 DIAGNOSIS — Z7951 Long term (current) use of inhaled steroids: Secondary | ICD-10-CM | POA: Diagnosis not present

## 2023-11-05 DIAGNOSIS — J4520 Mild intermittent asthma, uncomplicated: Secondary | ICD-10-CM | POA: Diagnosis not present

## 2023-11-05 DIAGNOSIS — Z931 Gastrostomy status: Secondary | ICD-10-CM

## 2023-11-05 DIAGNOSIS — Z7189 Other specified counseling: Secondary | ICD-10-CM | POA: Diagnosis not present

## 2023-11-05 DIAGNOSIS — Z886 Allergy status to analgesic agent status: Secondary | ICD-10-CM

## 2023-11-05 DIAGNOSIS — Z880 Allergy status to penicillin: Secondary | ICD-10-CM

## 2023-11-05 DIAGNOSIS — Z8249 Family history of ischemic heart disease and other diseases of the circulatory system: Secondary | ICD-10-CM

## 2023-11-05 DIAGNOSIS — F32A Depression, unspecified: Secondary | ICD-10-CM | POA: Diagnosis present

## 2023-11-05 DIAGNOSIS — Z66 Do not resuscitate: Secondary | ICD-10-CM | POA: Diagnosis present

## 2023-11-05 DIAGNOSIS — Z885 Allergy status to narcotic agent status: Secondary | ICD-10-CM

## 2023-11-05 DIAGNOSIS — J45909 Unspecified asthma, uncomplicated: Secondary | ICD-10-CM | POA: Diagnosis present

## 2023-11-05 DIAGNOSIS — Z8673 Personal history of transient ischemic attack (TIA), and cerebral infarction without residual deficits: Secondary | ICD-10-CM | POA: Diagnosis not present

## 2023-11-05 DIAGNOSIS — Z823 Family history of stroke: Secondary | ICD-10-CM

## 2023-11-05 DIAGNOSIS — Z79899 Other long term (current) drug therapy: Secondary | ICD-10-CM

## 2023-11-05 DIAGNOSIS — F4320 Adjustment disorder, unspecified: Secondary | ICD-10-CM | POA: Diagnosis present

## 2023-11-05 DIAGNOSIS — Z833 Family history of diabetes mellitus: Secondary | ICD-10-CM | POA: Diagnosis not present

## 2023-11-05 DIAGNOSIS — A419 Sepsis, unspecified organism: Principal | ICD-10-CM | POA: Diagnosis present

## 2023-11-05 DIAGNOSIS — R531 Weakness: Secondary | ICD-10-CM | POA: Diagnosis not present

## 2023-11-05 DIAGNOSIS — E785 Hyperlipidemia, unspecified: Secondary | ICD-10-CM | POA: Diagnosis present

## 2023-11-05 DIAGNOSIS — Z888 Allergy status to other drugs, medicaments and biological substances status: Secondary | ICD-10-CM

## 2023-11-05 DIAGNOSIS — G1221 Amyotrophic lateral sclerosis: Secondary | ICD-10-CM | POA: Diagnosis present

## 2023-11-05 DIAGNOSIS — G8929 Other chronic pain: Secondary | ICD-10-CM | POA: Diagnosis present

## 2023-11-05 DIAGNOSIS — M549 Dorsalgia, unspecified: Secondary | ICD-10-CM | POA: Diagnosis present

## 2023-11-05 DIAGNOSIS — M62838 Other muscle spasm: Secondary | ICD-10-CM | POA: Diagnosis present

## 2023-11-05 LAB — RESP PANEL BY RT-PCR (RSV, FLU A&B, COVID)  RVPGX2
Influenza A by PCR: NEGATIVE
Influenza B by PCR: NEGATIVE
Resp Syncytial Virus by PCR: NEGATIVE
SARS Coronavirus 2 by RT PCR: NEGATIVE

## 2023-11-05 LAB — BASIC METABOLIC PANEL
Anion gap: 11 (ref 5–15)
BUN: 24 mg/dL — ABNORMAL HIGH (ref 8–23)
CO2: 25 mmol/L (ref 22–32)
Calcium: 9.6 mg/dL (ref 8.9–10.3)
Chloride: 108 mmol/L (ref 98–111)
Creatinine, Ser: 0.56 mg/dL — ABNORMAL LOW (ref 0.61–1.24)
GFR, Estimated: >60 mL/min (ref >60)
Glucose, Bld: 107 mg/dL — ABNORMAL HIGH (ref 70–99)
Potassium: 3.5 mmol/L (ref 3.5–5.1)
Sodium: 144 mmol/L (ref 135–145)

## 2023-11-05 LAB — CBC
HCT: 40.5 % (ref 39.0–52.0)
Hemoglobin: 13.6 g/dL (ref 13.0–17.0)
MCH: 33.4 pg (ref 26.0–34.0)
MCHC: 33.6 g/dL (ref 30.0–36.0)
MCV: 99.5 fL (ref 80.0–100.0)
Platelets: 473 10*3/uL — ABNORMAL HIGH (ref 150–400)
RBC: 4.07 MIL/uL — ABNORMAL LOW (ref 4.22–5.81)
RDW: 12.5 % (ref 11.5–15.5)
WBC: 5.5 10*3/uL (ref 4.0–10.5)
nRBC: 0 % (ref 0.0–0.2)

## 2023-11-05 LAB — TROPONIN I (HIGH SENSITIVITY): Troponin I (High Sensitivity): 3 ng/L (ref <18)

## 2023-11-05 LAB — LACTIC ACID, PLASMA: Lactic Acid, Venous: 0.8 mmol/L (ref 0.5–1.9)

## 2023-11-05 LAB — GROUP A STREP BY PCR: Group A Strep by PCR: NOT DETECTED

## 2023-11-05 MED ORDER — BIOTENE DRY MOUTH MT LIQD: 15.0000 mL | OROMUCOSAL | Status: DC | PRN

## 2023-11-05 MED ORDER — SODIUM CHLORIDE 0.9 % IV SOLN: INTRAVENOUS | Status: DC

## 2023-11-05 MED ORDER — ONDANSETRON HCL 4 MG/2ML IJ SOLN
4.0000 mg | Freq: Once | INTRAMUSCULAR | Status: AC
Start: 2023-11-05 — End: 2023-11-05
  Administered 2023-11-05: 4 mg via INTRAVENOUS
  Filled 2023-11-05: qty 2

## 2023-11-05 MED ORDER — ONDANSETRON HCL 4 MG/2ML IJ SOLN
4.0000 mg | Freq: Four times a day (QID) | INTRAMUSCULAR | Status: DC | PRN
  Administered 2023-11-07: 4 mg via INTRAVENOUS
  Filled 2023-11-05: qty 2

## 2023-11-05 MED ORDER — METRONIDAZOLE 500 MG/100ML IV SOLN: 500.0000 mg | Freq: Two times a day (BID) | INTRAVENOUS | Status: DC

## 2023-11-05 MED ORDER — ONDANSETRON HCL 4 MG PO TABS: 4.0000 mg | ORAL_TABLET | Freq: Four times a day (QID) | ORAL | Status: DC | PRN

## 2023-11-05 MED ORDER — HALOPERIDOL LACTATE 5 MG/ML IJ SOLN
0.5000 mg | INTRAMUSCULAR | Status: DC | PRN
  Administered 2023-11-05 – 2023-11-06 (×3): 0.5 mg via INTRAVENOUS
  Filled 2023-11-05 (×3): qty 1

## 2023-11-05 MED ORDER — ENOXAPARIN SODIUM 40 MG/0.4ML IJ SOSY: 40.0000 mg | PREFILLED_SYRINGE | INTRAMUSCULAR | Status: DC

## 2023-11-05 MED ORDER — GLYCOPYRROLATE 0.2 MG/ML IJ SOLN: 0.2000 mg | INTRAMUSCULAR | Status: DC | PRN

## 2023-11-05 MED ORDER — SODIUM CHLORIDE 0.9 % IV SOLN: 2.0000 g | INTRAVENOUS | Status: DC

## 2023-11-05 MED ORDER — HALOPERIDOL LACTATE 2 MG/ML PO CONC: 0.5000 mg | ORAL | Status: DC | PRN

## 2023-11-05 MED ORDER — POLYVINYL ALCOHOL 1.4 % OP SOLN: 1.0000 [drp] | Freq: Four times a day (QID) | OPHTHALMIC | Status: DC | PRN

## 2023-11-05 MED ORDER — GLYCOPYRROLATE 1 MG PO TABS: 1.0000 mg | ORAL_TABLET | ORAL | Status: DC | PRN

## 2023-11-05 MED ORDER — FENTANYL CITRATE PF 50 MCG/ML IJ SOSY
25.0000 ug | PREFILLED_SYRINGE | Freq: Once | INTRAMUSCULAR | Status: AC
Start: 2023-11-05 — End: 2023-11-05
  Administered 2023-11-05: 25 ug via INTRAVENOUS
  Filled 2023-11-05: qty 1

## 2023-11-05 MED ORDER — HYDROMORPHONE HCL 1 MG/ML IJ SOLN
0.5000 mg | INTRAMUSCULAR | Status: DC | PRN
  Administered 2023-11-05 – 2023-11-06 (×5): 0.5 mg via INTRAVENOUS
  Filled 2023-11-05 (×5): qty 0.5

## 2023-11-05 MED ORDER — SODIUM CHLORIDE 0.9 % IV BOLUS
1000.0000 mL | Freq: Once | INTRAVENOUS | Status: AC
Start: 2023-11-05 — End: 2023-11-05
  Administered 2023-11-05: 1000 mL via INTRAVENOUS

## 2023-11-05 MED ORDER — METRONIDAZOLE 500 MG/100ML IV SOLN
500.0000 mg | Freq: Once | INTRAVENOUS | Status: AC
  Administered 2023-11-05: 500 mg via INTRAVENOUS
  Filled 2023-11-05: qty 100

## 2023-11-05 MED ORDER — ACETAMINOPHEN 650 MG RE SUPP: 650.0000 mg | Freq: Four times a day (QID) | RECTAL | Status: DC | PRN

## 2023-11-05 MED ORDER — HALOPERIDOL 0.5 MG PO TABS: 0.5000 mg | ORAL_TABLET | ORAL | Status: DC | PRN

## 2023-11-05 MED ORDER — SODIUM CHLORIDE 0.9 % IV SOLN
2.0000 g | Freq: Once | INTRAVENOUS | Status: AC
  Administered 2023-11-05: 2 g via INTRAVENOUS
  Filled 2023-11-05: qty 20

## 2023-11-05 MED ORDER — ACETAMINOPHEN 325 MG PO TABS: 650.0000 mg | ORAL_TABLET | Freq: Four times a day (QID) | ORAL | Status: DC | PRN

## 2023-11-05 MED ORDER — GLYCOPYRROLATE 0.2 MG/ML IJ SOLN
0.2000 mg | INTRAMUSCULAR | Status: DC | PRN
  Administered 2023-11-08: 0.2 mg via SUBCUTANEOUS
  Filled 2023-11-05 (×2): qty 1

## 2023-11-05 NOTE — Assessment & Plan Note (Signed)
Resp status relatively stable in setting of aspiration PNA  Prn duonebs  Monitor

## 2023-11-05 NOTE — ED Triage Notes (Signed)
Patient arrive to triage with cough, pointing to his chest. Patient is non-verbal at baseline. Patient shakes his head yes when asked his he is having chest pain.

## 2023-11-05 NOTE — ED Notes (Signed)
This tech and pt's RN changed pt's soiled brief. Pt comfortable and has no other needs at the moment.

## 2023-11-05 NOTE — ED Notes (Signed)
Pt removed IV and refusing to have monitoring equipment. Pt removed all monitoring equipment. Refusing IV placement.

## 2023-11-05 NOTE — ED Triage Notes (Signed)
Per EMS called out for weakness, pt has ALS, nonverbal at baseline, hospice recently started seeing patient, fentanyl patch to L upper chest. Pt with noted strong wet cough in triage. Per EMS pt c/o SOB, placed on 2L for comfort.   EMS also reports that patient's wife showed them a note that patient wrote stating he wanted to die and asking for his gun.    At baseline patient writes with a whiteboard to communicate.

## 2023-11-05 NOTE — Consult Note (Signed)
Consultation Note Date: 11/05/2023 at 0815  Patient Name: Shawn [name]  DOB: [birth year]  MRN: [id number]  Age / Sex: 63 y.o., male  PCP: [facility] Referring Physician: Andris Baumann, MD  HPI/Patient Profile: 63 y.o. male  with past medical history of ALS, CAD, HLD, history of CVA admitted on 11/05/2023 with weakness. Patient is being treated for mild sepsis and likely aspiration pneumonia in setting of PEG tube.    As per Ed Dr. Alvester Morin, patient is active with [facility] hosp[facility] and is wishing to go home.  PMT was consulted to support patient and goals of care and medical decision making.   Clinical Assessment and Goals of Care: Extensive chart review completed prior to meeting patient including labs, vital signs, imaging, progress notes, orders, and available advanced directive documents from current and previous encounters. I then met with patient at bedside to discuss diagnosis prognosis, GOC, EOL wishes, disposition and options. RN at bedside during my visit. Pt is writing for communication. His paper says he wants to go home several times.   I introduced Palliative Medicine as specialized medical care for people living with serious illness. It focuses on providing relief from the symptoms and stress of a serious illness. The goal is to improve quality of life for both the patient and the family.  Patient is awake, alert, and oriented.  His communication is via writing.  However, he is able to respond appropriately and endorses what is accurate/true in regards to his plan of care.  He is able to engage in goals of care discussions and make medical decision making independently at this time.  I discussed patient's functional, nutritional, and cognitive status PTA.  Denies difficulty or acute issues at home.  He endorses that his mother called and had him transferred to the hospital on his  behalf.  He was adamantly against coming to the hospital.  As per chart review, patient was active with [facility] hosp[facility].  Patient endorsed that hospice services were coming every day.  He denied any difficulty at home.  He continued to reiterate that he would just wants to go home.  Hospice services, hospice philosophy, aging in place, and comfort measures discussed in detail.  Patient nodded in agreement several times during this discussion.  He remains in agreement to discharge home with hospice services to continue.  Discussed code status and boundaries of care during hospitalization. He remains in agreement with DNR and comfort measures and that he would never like to be rehospitalized.  He also does not want to be admitted to the hospital during this hospitalization.  His main objective is to go home ASAP.  I conveyed these goals and wishes to Dr. Alvester Morin, RN, and TOC.  TOC following closely to help facilitate discharge from ED..  Orders adjusted to reflect full comfort measures as per patient's wishes.  PMT remains available to patient and family throughout his hospitalization.  Primary Decision Maker PATIENT  Physical Exam Vitals reviewed.  Constitutional:      General: He is not in acute distress.    Appearance: He is normal weight.  HENT:     Head: Normocephalic.  Eyes:     Pupils: Pupils are equal, round, and reactive to light.  Cardiovascular:     Rate and Rhythm: Tachycardia present.  Pulmonary:     Breath sounds: Examination of the right-lower field reveals decreased breath sounds. Examination of the left-lower field reveals decreased breath sounds. Decreased breath sounds present.  Musculoskeletal:     Comments: Generalized weakness  Skin:    General: Skin is warm and dry.  Neurological:     Mental Status: He is alert and oriented to person, place, and time.  Psychiatric:        Mood and Affect: Mood normal. Mood  is not anxious.        Behavior: Behavior normal. Behavior is not agitated.     Palliative Assessment/Data: 30%     Thank you for this consult. Palliative medicine will continue to follow and assist holistically.   Time Total: 75 minutes  Time spent includes: Detailed review of medical records (labs, imaging, vital signs), medically appropriate exam (mental status, respiratory, cardiac, skin), discussed with treatment team, counseling and educating patient, family and staff, documenting clinical information, medication management and coordination of care.  Signed by: Georgiann Cocker, DNP, FNP-BC Palliative Medicine   Please contact Palliative Medicine Team providers via [facility] for questions and concerns.

## 2023-11-05 NOTE — ED Notes (Signed)
Patient cleaned from urine incontinence including full linen change, brief, and chucks. CB in reach.

## 2023-11-05 NOTE — ED Notes (Signed)
2 person assist from bed to chair, fall alarm placed under pt and call bell within reach

## 2023-11-05 NOTE — Progress Notes (Signed)
Patient removing monitor equipment and refusing further monitoring. Patient stating he wants to walk out of room and leave AMA via pen/paper. Patient nods yes when asked if someone is coming to pick him up. Patient cleaned, changed, new brief applied. Patient refusing oxygen at this time. Patient with call bell. Patient states via pen/paper we are lying when we tell him he needs to stay here + in bed for safety. Patient in bed with siderails up and curtain open.

## 2023-11-05 NOTE — Assessment & Plan Note (Signed)
Baseline ALS with significant generalized weakness Nonverbal  Pt is working with outpatient hospice

## 2023-11-05 NOTE — Assessment & Plan Note (Signed)
Recently evaluated by [facility] for PEG tube management  Was due for IR PEG exchange  Some concern for delayed gastric emptying assd w/ ALS- on reglan  Pending nutrition consult to assess tube feeds given aspiration pneumonia
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
## 2023-11-05 NOTE — Assessment & Plan Note (Signed)
Noted cough and inability to tolerate p.o. fluids at home with concern for aspiration on imaging today Baseline ALS with PEG tube in place Will place on Rocephin and Flagyl for aspiration coverage Blood and respiratory cultures Supplemental oxygen as needed Nutrition consult to assess tube feeds Monitor

## 2023-11-05 NOTE — H&P (Addendum)
History and Physical    Patient: Shawn [name] ZOX:[id number] DOB: 1960/12/26 DOA: 11/05/2023 DOS: the patient was seen and examined on 11/05/2023 PCP: [facility]  Patient coming from: Home  Chief Complaint:  Chief Complaint  Patient presents with   Cough   Shortness of Breath   HPI: Shawn [name] is a 63 y.o. male with medical history significant of ALS, CAD, hyperlipidemia, history of CVA, T12 compression fracture presented with aspiration pneumonia.  Limited history in the setting of patient being nonverbal.  History from staff as well as mother.  Per report, patient with worsening cough at home.  Baseline ALS and nonverbal at baseline.  Lives at home alone per the mother.  Has had worsening cough as well as inability tolerate p.o. fluids over the past few days.  Baseline PEG tube in place.  Noted to have been recently evaluated in the [facility] system by surgery for issues with the PEG tube.  Some concern for excessive PEG tube volume as well as delayed gastric emptying.  Plan was for outpatient IR PEG tube exchange also.  Nutrition also consulted to help with tube feed management.  Mother also reports worsening functional status at home.  Mother states that she is 69 years old and feels very overwhelmed taking care of the patient.  Mother also reports patient being more combative at home.  Hospice has been consulted formally on the patient in addition to a court order being filed for potential placement.  Was brought to the ER because of worsening weakness at home.  Noted recent admission January 18 through July 21 for issues including syncope as well as T12 compression fracture. Presented to the ER Tmax 98.2, heart rate 100s, BP stable.  Satting well on room air.  White count 5.5, hemoglobin 13.6, platelets 473, COVID flu and RSV negative, creatinine 0.56.  Chest x-ray with trace bibasilar lung changes.     Initial plan was for admission for aspiration pneumonia with  nutrition consultation in addition to social work and palliative care consultation. However, patient is adamant about going home.  I will formally consult palliative care as well as social work to further coordinate patient's overall care and review of recent court order for placement.  The mother denies being the active healthcare power of attorney and does report that patient is currently his own power of attorney at present.  Cannot fully delineate mental status given baseline ALS and nonverbal status.  Hopefully social work as well as palliative care can work on appropriate dispo for patient whether would be placement versus inpatient hospice versus patient going home on comfort care.  Will follow-up on the recommendations.    Review of Systems: Unable to review all systems due to lack of cooperation from patient. Past Medical History:  Diagnosis Date   ALS (amyotrophic lateral sclerosis) (HCC)    Asthma    Coronary artery disease    Hyperlipidemia    Renal stone    Stroke [facility])    Past Surgical History:  Procedure Laterality Date   CARDIAC CATHETERIZATION  2006   negative   CYSTOSCOPY W/ URETERAL STENT REMOVAL Left 06/11/2015   Procedure: CYSTOSCOPY WITH STENT EXCHANGE;  Surgeon: Malen Gauze, MD;  Location: [facility] ORS;  Service: Urology;  Laterality: Left;   CYSTOSCOPY WITH STENT PLACEMENT Left 05/20/2015   Procedure: CYSTOSCOPY WITH STENT PLACEMENT;  Surgeon: Crist Fat, MD;  Location: [facility] ORS;  Service: Urology;  Laterality: Left;   CYSTOSCOPY/RETROGRADE/URETEROSCOPY/STONE EXTRACTION WITH  BASKET  2000   pt. unsure of which side done   CYSTOSCOPY/URETEROSCOPY/HOLMIUM LASER Left 06/11/2015   Procedure: CYSTOSCOPY/URETEROSCOPY/HOLMIUM LASER;  Surgeon: Malen Gauze, MD;  Location: [facility] ORS;  Service: Urology;  Laterality: Left;   IR KYPHO LUMBAR INC FX REDUCE BONE BX UNI/BIL CANNULATION INC/IMAGING  01/06/2022   KNEE SURGERY Right 84,96, 2003   x3   SHOULDER SURGERY Left  2010   Social History:  reports that he has never smoked. He has never used smokeless tobacco. He reports that he does not currently use alcohol. He reports that he does not use drugs.  Allergies  Allergen Reactions   Aspirin Other (See Comments), Itching, Shortness Of Breath and Swelling    Upset stomach  GI Upset   Compazine [Prochlorperazine] Other (See Comments)    Added per pt. Pt had tachycardia chest discomfort   Morphine And Codeine Itching   Naproxen Other (See Comments)    Upset stomach  GI Upset   Penicillins Rash   Tramadol Itching    Family History  Problem Relation Age of Onset   Cancer Mother    Heart failure Mother    Hypertension Mother    CVA Mother    Diabetes Other    Other Father        unknown medical history    Prior to Admission medications   Medication Sig Start Date End Date Taking? Authorizing Provider  acetaminophen (TYLENOL) 160 MG/5ML solution Take 480 mg by mouth every 6 (six) hours as needed for mild pain or moderate pain.    [provider]  albuterol (VENTOLIN HFA) 108 (90 Base) MCG/ACT inhaler Inhale 1-2 puffs into the lungs every 6 (six) hours as needed for wheezing or shortness of breath.    [provider]  arformoterol (BROVANA) 15 MCG/2ML NEBU Take 15 mcg by nebulization every 12 (twelve) hours.    [provider]  Baclofen 5 MG TABS Take 10 mg by mouth 3 (three) times daily.    [provider]  budesonide (PULMICORT) 0.25 MG/2ML nebulizer solution Take 0.25 mg by nebulization 2 (two) times daily.    [provider]  cyclobenzaprine (FLEXERIL) 5 MG tablet Take 5 mg by mouth 3 (three) times daily. 06/14/23   [provider]  fluticasone furoate-vilanterol (BREO ELLIPTA) 100-25 MCG/ACT AEPB Inhale 1 puff into the lungs in the morning.    [provider]  gabapentin (NEURONTIN) 250 MG/5ML solution Place 12 mLs into feeding tube 3 (three) times daily. 08/22/22   [provider]  HYDROcodone-acetaminophen (NORCO/VICODIN) 5-325 MG tablet Take 1 tablet by mouth every 8 (eight) hours as needed for severe pain. Must last 30 days. 10/08/23 11/07/23  Edward Jolly, MD  mirtazapine (REMERON) 15 MG tablet Take 15 mg by mouth at bedtime.    [provider]  montelukast (SINGULAIR) 10 MG tablet Take 10 mg by mouth at bedtime.    [provider]  promethazine (PHENERGAN) 25 MG tablet Take 25 mg by mouth every 6 (six) hours. 09/20/23   [provider]  riluzole (RILUTEK) 50 MG tablet Take 50 mg by mouth 2 (two) times daily.    [provider]    Physical Exam: Vitals:   11/05/23 0344 11/05/23 0400 11/05/23 0700 11/05/23 0755  BP: 137/81 (!) 123/55 114/66   Pulse: 93 (!) 106 88   Resp: 15 (!) 21    Temp: 98.2 F (36.8 C)     TempSrc: Axillary     SpO2:  96% 96%   Height:    5\' 10"  (1.778 m)   Physical Exam Constitutional:      Comments: Underweight, cachectic  HENT:     Head: Normocephalic and atraumatic.     Mouth/Throat:     Mouth: Mucous membranes are dry.  Eyes:     Pupils: Pupils are equal, round, and reactive to light.  Cardiovascular:     Rate and Rhythm: Normal rate and regular rhythm.  Pulmonary:     Effort: Pulmonary effort is normal.  Abdominal:     General: Bowel sounds are normal.  Musculoskeletal:     Comments: Positive generalized weakness in setting of ALS  Skin:    General: Skin is dry.  Neurological:     Comments: Positive generalized weakness and significant ALS No gross focal neurological deficits noted Patient nonverbal  Psychiatric:        Mood and Affect: Mood normal.  Data Reviewed:  There are no new results to review at this time.  DG Chest Port 1 View CLINICAL DATA:  Chest pain  EXAM: PORTABLE CHEST 1 VIEW  COMPARISON:  10/30/2023  FINDINGS: Low volume chest with streaky density at the bases attributed atelectasis. Normal heart size and mediastinal contours. No  visible effusion or pneumothorax.  IMPRESSION: Low volume chest with atelectasis at the bases.  Electronically Signed   By: Tiburcio Pea M.D.   On: 11/05/2023 04:34  Lab Results  Component Value Date   WBC 5.5 11/05/2023   HGB 13.6 11/05/2023   HCT 40.5 11/05/2023   MCV 99.5 11/05/2023   PLT 473 (H) 11/05/2023   Last metabolic panel Lab Results  Component Value Date   GLUCOSE 107 (H) 11/05/2023   NA 144 11/05/2023   K 3.5 11/05/2023   CL 108 11/05/2023   CO2 25 11/05/2023   BUN 24 (H) 11/05/2023   CREATININE 0.56 (L) 11/05/2023   GFRNONAA >60 11/05/2023   CALCIUM 9.6 11/05/2023   PROT 6.8 10/30/2023   ALBUMIN 3.7 10/30/2023   BILITOT 0.6 10/30/2023   ALKPHOS 105 10/30/2023   AST 43 (H) 10/30/2023   ALT 89 (H) 10/30/2023   ANIONGAP 11 11/05/2023    Assessment and Plan: * Aspiration pneumonia (HCC) Noted cough and inability to tolerate p.o. fluids at home with concern for aspiration on imaging today Baseline ALS with PEG tube in place Will place on Rocephin and Flagyl for aspiration coverage Blood and respiratory cultures Supplemental oxygen as needed Nutrition consult to assess tube feeds Monitor  S/P percutaneous endoscopic gastrostomy (PEG) tube placement [facility]) Recently evaluated by [facility] for PEG tube management  Was due for IR PEG exchange  Some concern for delayed gastric emptying assd w/ ALS- on reglan  Pending nutrition consult to assess tube feeds given aspiration pneumonia  Asthma, chronic Resp status relatively stable in setting of aspiration PNA  Prn duonebs  Monitor  ALS (amyotrophic lateral sclerosis) (HCC) Baseline ALS with significant generalized weakness Nonverbal  Pt is working with outpatient hospice        Advance Care Planning:   Code Status: Limited: Do not attempt resuscitation (DNR) -DNR-LIMITED -Do Not Intubate/DNI    Consults: Palliative care, TOC   Family Communication: Case discussed w/ daughter   Severity  of Illness: The appropriate patient status for this patient is OBSERVATION. Observation status is judged to be reasonable and necessary in order to provide the required intensity of service to ensure the patient's safety. The patient's presenting symptoms, physical exam findings, and  initial radiographic and laboratory data in the context of their medical condition is felt to place them at decreased risk for further clinical deterioration. Furthermore, it is anticipated that the patient will be medically stable for discharge from the hospital within 2 midnights of admission.   Author: Floydene Flock, MD 11/05/2023 8:55 AM  For on call review [email or web address].

## 2023-11-05 NOTE — ED Notes (Signed)
Pt had wet brief and bed linen, pt was cleaned, linen changed and dry brief placed.

## 2023-11-05 NOTE — TOC Initial Note (Signed)
Transition of Care [facility]) - Initial/Assessment Note    Patient Details  Name: Shawn [name] MRN: [id number] Date of Birth: Aug 30, 1961  Transition of Care [facility]) CM/SW Contact:    Margarito Liner, LCSW Phone Number: 11/05/2023, 12:21 PM  Clinical Narrative:  Per team, plan to return home and resume hospice services. CSW called [facility] liaison. She voiced concerns about patient returning home due to threatening his mother. MD is aware and will consult psych. CSW explained to the hospice liaison that patient is fully oriented so we cannot place him in a facility against his will. Received call from hospice agency social worker, Kia. She has made the APS report and is requesting psych evaluate for capacity. MD is aware. CSW left voicemail for assigned DSS social worker.            Expected Discharge Plan: Home w Hospice Care Barriers to Discharge: Continued Medical Work up   Patient Goals and CMS Choice            Expected Discharge Plan and Services     Post Acute Care Choice: Resumption of Svcs/PTA Provider Living arrangements for the past 2 months: Apartment                                      Prior Living Arrangements/Services Living arrangements for the past 2 months: Apartment   Patient language and need for interpreter reviewed:: Yes Do you feel safe going back to the place where you live?: Yes      Need for Family Participation in Patient Care: Yes (Comment) Care giver support system in place?: Yes (comment) Current home services: Hospice Criminal Activity/Legal Involvement Pertinent to Current Situation/Hospitalization: No - Comment as needed  Activities of Daily Living   ADL Screening (condition at time of admission) Independently performs ADLs?: No Does the patient have a NEW difficulty with bathing/dressing/toileting/self-feeding that is expected to last >3 days?: Yes (Initiates electronic notice to provider for possible OT consult) Does the patient  have a NEW difficulty with getting in/out of bed, walking, or climbing stairs that is expected to last >3 days?: Yes (Initiates electronic notice to provider for possible PT consult) Does the patient have a NEW difficulty with communication that is expected to last >3 days?: No Is the patient deaf or have difficulty hearing?: No Does the patient have difficulty seeing, even when wearing glasses/contacts?: No Does the patient have difficulty concentrating, remembering, or making decisions?: No  Permission Sought/Granted                  Emotional Assessment       Orientation: : Oriented to Self, Oriented to Place, Oriented to  Time, Oriented to Situation Alcohol / Substance Use: Not Applicable Psych Involvement: Yes (comment) (Will be consulted)  Admission diagnosis:  Aspiration pneumonia (HCC) [J69.0] Patient Active Problem List   Diagnosis Date Noted   Aspiration pneumonia (HCC) 11/05/2023   Malnutrition of moderate degree 10/23/2023   Syncope 10/20/2023   Compression fracture of body of thoracic vertebra (HCC) 10/20/2023   S/P percutaneous endoscopic gastrostomy (PEG) tube placement (HCC) 10/20/2023   CAD (coronary artery disease) 10/20/2023   Cervical radicular pain 02/06/2023   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   Chronic right shoulder pain 02/06/2023   Chronic pain syndrome 06/28/2022   Pain management contract signed 06/28/2022   Closed compression fracture of body of L1 vertebra (HCC)  04/03/2022   Dyslipidemia 04/03/2022   Right leg pain 11/13/2021   COVID-19 virus infection 11/13/2021   Acute hypoxemic respiratory failure due to COVID-19 [facility]) 11/13/2021   Acute respiratory failure with hypoxia (HCC) 11/12/2021   Compression fracture of L2 (HCC) 11/07/2021   Lumbago 11/07/2021   Pain in both lower legs 11/07/2021   ALS (amyotrophic lateral sclerosis) (HCC) 11/07/2021   Asthma, chronic 11/07/2021   Ureteral stone 05/20/2015   PCP:  [employer] Pharmacy:   [id number] [place], [place] - [address] [place] [address] Phone: [phone] Fax: [phone]     Social Drivers of Health (SDOH) Social History: SDOH Screenings   Food Insecurity: No Food Insecurity (10/20/2023)  Housing: Unknown (10/20/2023)  Transportation Needs: No Transportation Needs (10/20/2023)  Utilities: Not At Risk (10/20/2023)  Depression (PHQ2-9): Low Risk  (07/05/2023)  Social Connections: Moderately Isolated (10/20/2023)  Tobacco Use: Low Risk  (11/05/2023)   SDOH Interventions:     Readmission Risk Interventions     No data to display

## 2023-11-06 DIAGNOSIS — G1221 Amyotrophic lateral sclerosis: Secondary | ICD-10-CM

## 2023-11-06 DIAGNOSIS — Z931 Gastrostomy status: Secondary | ICD-10-CM | POA: Diagnosis not present

## 2023-11-06 DIAGNOSIS — A419 Sepsis, unspecified organism: Secondary | ICD-10-CM | POA: Diagnosis not present

## 2023-11-06 DIAGNOSIS — Z515 Encounter for palliative care: Secondary | ICD-10-CM

## 2023-11-06 DIAGNOSIS — J690 Pneumonitis due to inhalation of food and vomit: Secondary | ICD-10-CM | POA: Diagnosis not present

## 2023-11-06 MED ORDER — HYDROMORPHONE HCL 1 MG/ML IJ SOLN
0.5000 mg | INTRAMUSCULAR | Status: DC | PRN
  Administered 2023-11-06: 0.5 mg via INTRAVENOUS
  Filled 2023-11-06: qty 0.5

## 2023-11-06 MED ORDER — ALPRAZOLAM 0.5 MG PO TABS
0.5000 mg | ORAL_TABLET | Freq: Two times a day (BID) | ORAL | Status: DC | PRN
  Administered 2023-11-06 – 2023-11-09 (×2): 0.5 mg via ORAL
  Filled 2023-11-06 (×2): qty 1

## 2023-11-06 MED ORDER — FENTANYL 25 MCG/HR TD PT72
1.0000 | MEDICATED_PATCH | TRANSDERMAL | Status: DC
  Administered 2023-11-06 – 2023-11-09 (×2): 1 via TRANSDERMAL
  Filled 2023-11-06 (×2): qty 1

## 2023-11-06 MED ORDER — HYDROMORPHONE HCL 1 MG/ML IJ SOLN
1.0000 mg | INTRAMUSCULAR | Status: DC | PRN
  Administered 2023-11-06 – 2023-11-07 (×7): 1 mg via INTRAVENOUS
  Filled 2023-11-06 (×7): qty 1

## 2023-11-06 MED ORDER — HYDROMORPHONE HCL 1 MG/ML IJ SOLN
1.0000 mg | Freq: Once | INTRAMUSCULAR | Status: AC
  Administered 2023-11-06: 1 mg via INTRAVENOUS
  Filled 2023-11-06: qty 1

## 2023-11-06 NOTE — ED Notes (Signed)
Pt wrote on paper requesting this tech to call hospice. Explained to pt that the writer cannot do that

## 2023-11-06 NOTE — ED Notes (Signed)
Patient's brief checked at this time and noted to be clean and dry. Patient repositioned in bed.

## 2023-11-06 NOTE — Consult Note (Signed)
 [facility] Consult Note  Patient Name: Shawn [name] MRN: [id number] DOB: 1961-05-20 DATE OF Consult: 11/06/2023  PRIMARY PSYCHIATRIC DIAGNOSES  1.  Difficulty with self-care  2.  Adjustment disorder 3.  ALS  RECOMMENDATIONS  Medication recommendations: continue medications as prescribed Non-Medication/therapeutic recommendations: Consider medical/hospitalist consult to assess patient's chronic condition and inability to care for himself Communication: Treatment team members (and family members if applicable) who were involved in treatment/care discussions and planning, and with whom we spoke or engaged with via secure text/chat, include the following:  patient's treatment team. Thank you for involving us  in the care of this patient. If you have any additional questions or concerns, please call [phone] and ask for me or the provider on-call.  TELEPSYCHIATRY ATTESTATION & CONSENT  As the provider for this telehealth consult, I attest that I verified the patient's identity using two separate identifiers, introduced myself to the patient, provided my credentials, disclosed my location, and performed this encounter via a HIPAA-compliant, real-time, face-to-face, two-way, interactive audio and video platform and with the full consent and agreement of the patient (or guardian as applicable.)  Patient physical location: [place]. Telehealth provider physical location: home office in state of [place].  Video start time: 0237 (Central Time) Video end time: 0311 (Central Time)  IDENTIFYING DATA  Shawn [name] is a 63 y.o. year-old male for whom a psychiatric consultation has been ordered by the primary provider. The patient was identified using two separate identifiers.  CHIEF COMPLAINT/REASON FOR CONSULT  Psych eval  HISTORY OF PRESENT ILLNESS (HPI)  The patient, a 63 year old male with a history of ALS. He was brought to the ER for evaluation due to family reporting patient writing a  suicide note, threatening his 54 year old mother and inability to care for himself. Patient is none verbal and had to communicate using a notepad and pen.  During evaluation, patient reports that he was brought to the hospital by EMS. Patient reported that  he was at home and when he found himself in the hospital. Upon further inquiry, he mentioned that he was brought in for a heart check. The patient's mother, aged 48, had expressed concerns to ER staff about his behavior, indicating that he had been difficult at home. However, the patient stated that he lives alone, with hospice care visiting twice a week and his sister assisting on other days.  There was a report of a suicide note, allegedly written by the patient, expressing a desire to die and mentioning a gun. The patient denied writing such a note and claimed no knowledge of its origin. He also denied having any suicidal thoughts or previous attempts to harm himself. The patient reports that he has not been admitted to a mental health facility before and does not believe anyone is trying to harm him.  Despite his family's concerns about his ability to care for himself, the patient insisted that he is capable of living independently. He mentioned using a power chair to assist with mobility and claimed to manage his daily activities, including personal hygiene and feeding, independently. However, there was some contradiction in his statements, as it was observed that he is not self-sufficient and unable to care for himself.  The patient was reluctant to sign documents related to Medicare rights, expressing a desire not to be admitted. This reluctance raised concerns about his decision-making capacity regarding his health. Throughout the conversation, the patient appeared to be stretching and was advised to use the call button for assistance  if needed.  PAST PSYCHIATRIC HISTORY  denies past psychiatric hospitalizations denies history of self-harm or  suicidal attempts denies history of violence towards others  PAST MEDICAL HISTORY  Past Medical History:  Diagnosis Date   ALS (amyotrophic lateral sclerosis) (HCC)    Asthma    Coronary artery disease    Hyperlipidemia    Renal stone    Stroke [facility])      HOME MEDICATIONS  Facility Ordered Medications  Medication   [COMPLETED] fentaNYL  (SUBLIMAZE ) injection 25 mcg   [COMPLETED] ondansetron  (ZOFRAN ) injection 4 mg   [COMPLETED] cefTRIAXone  (ROCEPHIN ) 2 g in sodium chloride  0.9 % 100 mL IVPB   [COMPLETED] metroNIDAZOLE  (FLAGYL ) IVPB 500 mg   [COMPLETED] sodium chloride  0.9 % bolus 1,000 mL   ondansetron  (ZOFRAN ) tablet 4 mg   Or   ondansetron  (ZOFRAN ) injection 4 mg   acetaminophen  (TYLENOL ) tablet 650 mg   Or   acetaminophen  (TYLENOL ) suppository 650 mg   haloperidol  (HALDOL ) tablet 0.5 mg   Or   haloperidol  (HALDOL ) 2 MG/ML solution 0.5 mg   Or   haloperidol  lactate (HALDOL ) injection 0.5 mg   glycopyrrolate  (ROBINUL ) tablet 1 mg   Or   glycopyrrolate  (ROBINUL ) injection 0.2 mg   Or   glycopyrrolate  (ROBINUL ) injection 0.2 mg   antiseptic oral rinse (BIOTENE) solution 15 mL   polyvinyl alcohol  (LIQUIFILM TEARS) 1.4 % ophthalmic solution 1 drop   HYDROmorphone  (DILAUDID ) injection 0.5 mg   PTA Medications  Medication Sig   Baclofen  5 MG TABS Take 10 mg by mouth 3 (three) times daily.   montelukast  (SINGULAIR ) 10 MG tablet Take 10 mg by mouth at bedtime.   riluzole  (RILUTEK ) 50 MG tablet Take 50 mg by mouth 2 (two) times daily.   gabapentin  (NEURONTIN ) 250 MG/5ML solution Place 12 mLs into feeding tube 3 (three) times daily.   HYDROcodone -acetaminophen  (NORCO/VICODIN) 5-325 MG tablet Take 1 tablet by mouth every 8 (eight) hours as needed for severe pain. Must last 30 days.   budesonide (PULMICORT) 0.25 MG/2ML nebulizer solution Take 0.25 mg by nebulization 2 (two) times daily.   cyclobenzaprine (FLEXERIL) 5 MG tablet Take 5 mg by mouth 3 (three) times daily.    promethazine (PHENERGAN) 25 MG tablet Take 25 mg by mouth every 6 (six) hours.   mirtazapine (REMERON) 15 MG tablet Take 15 mg by mouth at bedtime.   fentaNYL  (DURAGESIC ) 75 MCG/HR Place 1 patch onto the skin every 3 (three) days.   fluticasone  furoate-vilanterol (BREO ELLIPTA ) 100-25 MCG/ACT AEPB Inhale 1 puff into the lungs in the morning. (Patient not taking: Reported on 11/05/2023)   acetaminophen  (TYLENOL ) 160 MG/5ML solution Take 480 mg by mouth every 6 (six) hours as needed for mild pain or moderate pain.   albuterol  (VENTOLIN  HFA) 108 (90 Base) MCG/ACT inhaler Inhale 1-2 puffs into the lungs every 6 (six) hours as needed for wheezing or shortness of breath.   arformoterol (BROVANA) 15 MCG/2ML NEBU Take 15 mcg by nebulization every 12 (twelve) hours.     ALLERGIES  Allergies  Allergen Reactions   Aspirin Other (See Comments), Itching, Shortness Of Breath and Swelling    Upset stomach  GI Upset   Compazine  [Prochlorperazine ] Other (See Comments)    Added per pt. Pt had tachycardia chest discomfort   Morphine  And Codeine Itching   Naproxen Other (See Comments)    Upset stomach  GI Upset   Penicillins Rash   Tramadol  Itching    SOCIAL & SUBSTANCE USE HISTORY  Social  History   Socioeconomic History   Marital status: Single    Spouse name: Not on file   Number of children: Not on file   Years of education: Not on file   Highest education level: Not on file  Occupational History   Not on file  Tobacco Use   Smoking status: Never   Smokeless tobacco: Never  Vaping Use   Vaping status: Never Used  Substance and Sexual Activity   Alcohol  use: Not Currently    Alcohol /week: 0.0 standard drinks of alcohol     Comment: occasionally   Drug use: Never   Sexual activity: Not on file  Other Topics Concern   Not on file  Social History Narrative   PT has a feeding tube if needed when he doesn't eat enough.    Social Drivers of Corporate Investment Banker Strain: Not on file   Food Insecurity: Patient Declined (11/05/2023)   Hunger Vital Sign    Worried About Running Out of Food in the Last Year: Patient declined    Ran Out of Food in the Last Year: Patient declined  Transportation Needs: Patient Declined (11/05/2023)   PRAPARE - Administrator, Civil Service (Medical): Patient declined    Lack of Transportation (Non-Medical): Patient declined  Physical Activity: Not on file  Stress: Not on file  Social Connections: Patient Declined (11/05/2023)   Social Connection and Isolation Panel [NHANES]    Frequency of Communication with Friends and Family: Patient declined    Frequency of Social Gatherings with Friends and Family: Patient declined    Attends Religious Services: Patient declined    Database Administrator or Organizations: Patient declined    Attends Banker Meetings: Patient declined    Marital Status: Patient declined  Recent Concern: Social Connections - Moderately Isolated (10/20/2023)   Social Connection and Isolation Panel [NHANES]    Frequency of Communication with Friends and Family: Twice a week    Frequency of Social Gatherings with Friends and Family: Once a week    Attends Religious Services: Never    Database Administrator or Organizations: No    Attends Engineer, Structural: 1 to 4 times per year    Marital Status: Never married   Social History   Tobacco Use  Smoking Status Never  Smokeless Tobacco Never   Social History   Substance and Sexual Activity  Alcohol  Use Not Currently   Alcohol /week: 0.0 standard drinks of alcohol    Comment: occasionally   Social History   Substance and Sexual Activity  Drug Use Never    Additional pertinent information .  FAMILY HISTORY  Family History  Problem Relation Age of Onset   Cancer Mother    Heart failure Mother    Hypertension Mother    CVA Mother    Diabetes Other    Other Father        unknown medical history   Family Psychiatric History (if  known):  unknown  MENTAL STATUS EXAM (MSE)  Mental Status Exam: General Appearance: Disheveled  Orientation:  Other:  person and place  Memory:  Immediate;   Poor Recent;   Poor Remote;   Poor  Concentration:  Concentration: Poor and Attention Span: Poor  Recall:  Poor  Attention  Poor  Eye Contact:  Poor  Speech:   non verbal  Language:  NA  Volume:   na  Mood: unable to assess  Affect:  Blunt  Thought Process:  NA  Thought Content:  NA  Suicidal Thoughts:  No  Homicidal Thoughts:  No  Judgement:  Poor  Insight:  Lacking  Psychomotor Activity:  Psychomotor Retardation and Restlessness  Akathisia:  NA  Fund of Knowledge:  NA    Assets:  Manufacturing Systems Engineer Social Support  Cognition:  Impaired,  Severe  ADL's:  Impaired  AIMS (if indicated):       VITALS  Blood pressure 120/78, pulse (!) 102, temperature 98.2 F (36.8 C), temperature source Axillary, resp. rate 14, height 5' 10 (1.778 m), SpO2 97%.  LABS  Admission on 11/05/2023  Component Date Value Ref Range Status   Group A Strep by PCR 11/05/2023 NOT DETECTED  NOT DETECTED Final   Performed at [facility], [address] [place], [place] [address]   SARS Coronavirus 2 by RT PCR 11/05/2023 NEGATIVE  NEGATIVE Final   Comment: (NOTE) SARS-CoV-2 target nucleic acids are NOT DETECTED.  The SARS-CoV-2 RNA is generally detectable in upper respiratory specimens during the acute phase of infection. The lowest concentration of SARS-CoV-2 viral copies this assay can detect is 138 copies/mL. A negative result does not preclude SARS-Cov-2 infection and should not be used as the sole basis for treatment or other patient management decisions. A negative result may occur with  improper specimen collection/handling, submission of specimen other than nasopharyngeal swab, presence of viral mutation(s) within the areas targeted by this assay, and inadequate number of viral copies(<138 copies/mL). A negative result  must be combined with clinical observations, patient history, and epidemiological information. The expected result is Negative.  Fact Sheet for Patients:  bloggercourse.com  Fact Sheet for Healthcare Providers:  seriousbroker.it  This test is no                          t yet approved or cleared by the [place]  FDA and  has been authorized for detection and/or diagnosis of SARS-CoV-2 by FDA under an Emergency Use Authorization (EUA). This EUA will remain  in effect (meaning this test can be used) for the duration of the COVID-19 declaration under Section 564(b)(1) of the Act, 21 U.S.C.section 360bbb-3(b)(1), unless the authorization is terminated  or revoked sooner.       Influenza A by PCR 11/05/2023 NEGATIVE  NEGATIVE Final   Influenza B by PCR 11/05/2023 NEGATIVE  NEGATIVE Final   Comment: (NOTE) The Xpert Xpress SARS-CoV-2/FLU/RSV plus assay is intended as an aid in the diagnosis of influenza from Nasopharyngeal swab specimens and should not be used as a sole basis for treatment. Nasal washings and aspirates are unacceptable for Xpert Xpress SARS-CoV-2/FLU/RSV testing.  Fact Sheet for Patients: bloggercourse.com  Fact Sheet for Healthcare Providers: seriousbroker.it  This test is not yet approved or cleared by the [place]  FDA and has been authorized for detection and/or diagnosis of SARS-CoV-2 by FDA under an Emergency Use Authorization (EUA). This EUA will remain in effect (meaning this test can be used) for the duration of the COVID-19 declaration under Section 564(b)(1) of the Act, 21 U.S.C. section 360bbb-3(b)(1), unless the authorization is terminated or revoked.     Resp Syncytial Virus by PCR 11/05/2023 NEGATIVE  NEGATIVE Final   Comment: (NOTE) Fact Sheet for Patients: bloggercourse.com  Fact Sheet for Healthcare  Providers: seriousbroker.it  This test is not yet approved or cleared by the [place]  FDA and has been authorized for detection and/or diagnosis of SARS-CoV-2 by FDA under an Emergency Use Authorization (EUA). This EUA  will remain in effect (meaning this test can be used) for the duration of the COVID-19 declaration under Section 564(b)(1) of the Act, 21 U.S.C. section 360bbb-3(b)(1), unless the authorization is terminated or revoked.  Performed at [facility], [address] [place], [place] [address]    Sodium 11/05/2023 144  135 - 145 mmol/L Final   Potassium 11/05/2023 3.5  3.5 - 5.1 mmol/L Final   Chloride 11/05/2023 108  98 - 111 mmol/L Final   CO2 11/05/2023 25  22 - 32 mmol/L Final   Glucose, Bld 11/05/2023 107 (H)  70 - 99 mg/dL Final   Glucose reference range applies only to samples taken after fasting for at least 8 hours.   BUN 11/05/2023 24 (H)  8 - 23 mg/dL Final   Creatinine, Ser 11/05/2023 0.56 (L)  0.61 - 1.24 mg/dL Final   Calcium  11/05/2023 9.6  8.9 - 10.3 mg/dL Final   GFR, Estimated 11/05/2023 >60  >60 mL/min Final   Comment: (NOTE) Calculated using the CKD-EPI Creatinine Equation (2021)    Anion gap 11/05/2023 11  5 - 15 Final   Performed at [facility], [address] [place], [place] [address]   WBC 11/05/2023 5.5  4.0 - 10.5 K/uL Final   RBC 11/05/2023 4.07 (L)  4.22 - 5.81 MIL/uL Final   Hemoglobin 11/05/2023 13.6  13.0 - 17.0 g/dL Final   HCT 97/96/7974 40.5  39.0 - 52.0 % Final   MCV 11/05/2023 99.5  80.0 - 100.0 fL Final   MCH 11/05/2023 33.4  26.0 - 34.0 pg Final   MCHC 11/05/2023 33.6  30.0 - 36.0 g/dL Final   RDW 97/96/7974 12.5  11.5 - 15.5 % Final   Platelets 11/05/2023 473 (H)  150 - 400 K/uL Final   nRBC 11/05/2023 0.0  0.0 - 0.2 % Final   Performed at [facility], [address] [place], [place] [address]   Troponin I (High Sensitivity) 11/05/2023 3  <18 ng/L Final    Comment: (NOTE) Elevated high sensitivity troponin I (hsTnI) values and significant  changes across serial measurements may suggest ACS but many other  chronic and acute conditions are known to elevate hsTnI results.  Refer to the Links section for chest pain algorithms and additional  guidance. Performed at [facility], [address] [place], [place] [address]    Lactic Acid, Venous 11/05/2023 0.8  0.5 - 1.9 mmol/L Final   Performed at [facility], [address] [place], [place] [address]    PSYCHIATRIC REVIEW OF SYSTEMS (ROS)  ROS: Notable for the following relevant positive findings: ROS  Additional findings:      Musculoskeletal: Impaired      Gait & Station: Laying/Sitting      Pain Screening: Present - mild to moderate      Nutrition & Dental Concerns:   RISK FORMULATION/ASSESSMENT  Is the patient experiencing any suicidal or homicidal ideations: No       Explain if yes:  Protective factors considered for safety management: access to appropriate clinical services   Risk factors/concerns considered for safety management:  Depression Physical illness/chronic pain Age over 88 Unwillingness to seek help Male gender Unmarried  Is there a safety management plan with the patient and treatment team to minimize risk factors and promote protective factors: Yes           Explain: Consider medical/hospitalist consult to assess patient's chronic condition and inability to care for himself. Is crisis care  placement or psychiatric hospitalization recommended: No     Based on my current evaluation and risk assessment, patient is determined at this time to be at:  Moderate Risk  *RISK ASSESSMENT Risk assessment is a dynamic process; it is possible that this patient's condition, and risk level, may change. This should be re-evaluated and managed over time as appropriate. Please re-consult psychiatric consult services if additional assistance is needed in  terms of risk assessment and management. If your team decides to discharge this patient, please advise the patient how to best access emergency psychiatric services, or to call 911, if their condition worsens or they feel unsafe in any way.   Ines Hock, NP Telepsychiatry Consult Services

## 2023-11-06 NOTE — ED Notes (Signed)
 Writer in room due to safety fall alarm going off. Pt attempting to go over side rail with legs over. Pt repositioned and writer enquiring reason of behavior. Pt uses pen and paper to communicate that he request to leave facility and for staff to call his mother or sister to pick him up and take him home. Pt advised that he is awaiting social work and psych to complete his evaluation due to his 65 y/o mother concerned of SI, increased aggression and her inability to continue to care for him at home. Assigned RN, Ship broker requesting reason for no psych consult prior completed. Sitter order placed due to continuous need for pt re-direction and poor safety judgement.

## 2023-11-06 NOTE — TOC CM/SW Note (Signed)
 Transition of Care [facility]) - Inpatient Brief Assessment   Patient Details  Name: Shawn [name] MRN: [id number] Date of Birth: [birth year]  Transition of Care [facility]) CM/SW Contact:    Silvano Molt, LCSW Phone Number: 11/06/2023, 3:40 PM   Clinical Narrative:  CSW received notification from CMA stating pt's mother is here to visit with pt and DSS is also involved, Harlene Georgi, [phone]. CSW made contact with Harlene to discuss current status. Harlene reports she attempted to assess pt last week in ED; however, pt was sleeping and she did not want to disturb him. Harlene reports hospice social worker made APS report as pt was left home alone. Pt lives independently, but often has the support and supervision from his sister (who works) and his 17 year old mother. DSS believes pt needs 24/7 care, but still needs to assess. CSW reviewed psych consult note, which reports concerns of pt being unable to care for self independently. DSS expresses additional concerns of pt having capacity to make his own decisions, as pt is refusing home hospice care and wants to return to his home. CSW consulted with medical team, to include Psych NPs to request capacity. DSS wants to know if pt has capacity to make his own medical decisions. Attending reports if pt wants to return home and family can support with hospice services in place, pt can discharge home. CSW went to meet with pt and family, but pt no longer in ED and admitted to floor. CSW added Faye, RN CM in chat to take over.   Transition of Care Asessment: Insurance and Status: Insurance coverage has been reviewed Patient has primary care physician: Yes (w/hospice) Home environment has been reviewed: Pt currently lives home alone, has some supervision with sister and mother   Prior/Current Home Services: Current home services (Home hospice) Social Drivers of Health Review: SDOH reviewed no interventions necessary Readmission risk has been reviewed:  Yes Transition of care needs: transition of care needs identified, TOC will continue to follow

## 2023-11-06 NOTE — Progress Notes (Signed)
 Progress Note   Patient: Shawn [name] FMW:[id number] DOB: 07-31-1961 DOA: 11/05/2023     1 DOS: the patient was seen and examined on 11/06/2023   Brief hospital course: From HPI Shawn [name] is a 63 y.o. male with medical history significant of ALS, CAD, hyperlipidemia, history of CVA, T12 compression fracture presented with aspiration pneumonia.  Limited history in the setting of patient being nonverbal.  History from staff as well as mother.  Per report, patient with worsening cough at home.  Baseline ALS and nonverbal at baseline.  Lives at home alone per the mother.  Has had worsening cough as well as inability tolerate p.o. fluids over the past few days.  Baseline PEG tube in place.  Noted to have been recently evaluated in the [facility] system by surgery for issues with the PEG tube.  Some concern for excessive PEG tube volume as well as delayed gastric emptying.  Plan was for outpatient IR PEG tube exchange also.    After discussion by palliative team with patient's decision was madedmissio To transition to comfort care measures only.     Assessment and Plan:   Aspiration pneumonia (HCC) After discussion by palliative team with patient's decision was made Patient has been transitioned to comfort care measures only.  Palliative care on board Patient does not desire antibiotic therapy except comfort care measures only Continue as needed Dilaudid , glycopyrrolate , alprazolam  TOC has APS as well as DSS involvement as patient is said to be on CPAP at home    S/P percutaneous endoscopic gastrostomy (PEG) tube placement [facility]) Recently evaluated by [facility] for PEG tube management  Was due for IR PEG exchange  Dietitian following   Asthma, chronic Continue only comfort care measures   ALS (amyotrophic lateral sclerosis) (HCC) Baseline ALS with significant generalized weakness Nonverbal  Patient has outpatient hospice follow-up    Advance Care Planning:   Code Status: Limited:  Do not attempt resuscitation (DNR) -DNR-LIMITED -Do Not Intubate/DNI     Consults: Palliative care, TOC    Family Communication: Case discussed w/ daughter   Subjective:  Patient seen and examined at baseline He is alert however nonverbal and communicates by writing on a sheet of paper He has no complaints today  Physical Exam:  Constitutional:      Comments: Underweight, cachectic  HENT:     Head: Normocephalic and atraumatic.  Eyes:     Pupils: Pupils are equal, round, and reactive to light.  Cardiovascular:     Rate and Rhythm: Normal rate and regular rhythm.  Pulmonary:     Effort: Pulmonary effort is normal.  Abdominal:     General: Bowel sounds are normal.  Musculoskeletal:     Comments: Positive generalized weakness in setting of ALS  Skin:    General: Skin is dry.  Neurological:     Comments: Positive generalized weakness and significant ALS No gross focal neurological deficits noted Patient nonverbal  Psychiatric:        Mood and Affect: Mood normal.   Data Reviewed: I have reviewed patient's chest x-ray that showed findings of atelectasis at the bases    Latest Ref Rng & Units 11/05/2023    4:03 AM 10/30/2023    8:36 PM 10/21/2023    3:39 AM  BMP  Glucose 70 - 99 mg/dL 892  891  890   BUN 8 - 23 mg/dL 24  16  15    Creatinine 0.61 - 1.24 mg/dL 9.43  9.47  9.38  Sodium 135 - 145 mmol/L 144  138  139   Potassium 3.5 - 5.1 mmol/L 3.5  4.3  4.6   Chloride 98 - 111 mmol/L 108  105  109   CO2 22 - 32 mmol/L 25  22  22    Calcium  8.9 - 10.3 mg/dL 9.6  9.4  8.9        Latest Ref Rng & Units 11/05/2023    4:03 AM 10/30/2023    8:36 PM 10/21/2023    3:39 AM  CBC  WBC 4.0 - 10.5 K/uL 5.5  5.0  6.1   Hemoglobin 13.0 - 17.0 g/dL 86.3  87.2  87.4   Hematocrit 39.0 - 52.0 % 40.5  37.6  37.2   Platelets 150 - 400 K/uL 473  339  179     Vitals:   11/06/23 0500 11/06/23 0938 11/06/23 1354 11/06/23 1414  BP: (!) 138/92 114/80 122/77 128/68  Pulse: 96 95 94 96  Resp:  15 20    Temp: (!) 97.2 F (36.2 C) (!) 97 F (36.1 C)  (!) 97.4 F (36.3 C)  TempSrc: Oral Oral    SpO2: 95% 97% 96% 96%  Height:        Time spent: 55 minutes  Author: Drue ONEIDA Potter, MD 11/06/2023 3:21 PM  For on call review [email or web address].

## 2023-11-06 NOTE — ED Notes (Signed)
Per TTS Jasmine and tele-psych NP, pt to be evaluated at scheduled time of 0400 with need for TTS to assist in interpreting for written responses made by pt during evaluation.

## 2023-11-06 NOTE — Plan of Care (Signed)
  Problem: Education: Goal: Knowledge of General Education information will improve Description: Including pain rating scale, medication(s)/side effects and non-pharmacologic comfort measures Outcome: Progressing   Problem: Health Behavior/Discharge Planning: Goal: Ability to manage health-related needs will improve Outcome: Progressing   Problem: Clinical Measurements: Goal: Ability to maintain clinical measurements within normal limits will improve Outcome: Progressing Goal: Will remain free from infection Outcome: Progressing Goal: Diagnostic test results will improve Outcome: Progressing Goal: Respiratory complications will improve Outcome: Progressing Goal: Cardiovascular complication will be avoided Outcome: Progressing   Problem: Activity: Goal: Risk for activity intolerance will decrease Outcome: Progressing   Problem: Nutrition: Goal: Adequate nutrition will be maintained Outcome: Progressing   Problem: Coping: Goal: Level of anxiety will decrease Outcome: Progressing   Problem: Elimination: Goal: Will not experience complications related to bowel motility Outcome: Progressing Goal: Will not experience complications related to urinary retention Outcome: Progressing   Problem: Pain Managment: Goal: General experience of comfort will improve and/or be controlled Outcome: Progressing   Problem: Safety: Goal: Ability to remain free from injury will improve Outcome: Progressing   Problem: Skin Integrity: Goal: Risk for impaired skin integrity will decrease Outcome: Progressing   Problem: Education: Goal: Knowledge of the prescribed therapeutic regimen will improve Outcome: Progressing   Problem: Coping: Goal: Ability to identify and develop effective coping behavior will improve Outcome: Progressing   Problem: Clinical Measurements: Goal: Quality of life will improve Outcome: Progressing   Problem: Respiratory: Goal: Verbalizations of increased ease  of respirations will increase Outcome: Progressing   Problem: Role Relationship: Goal: Family's ability to cope with current situation will improve Outcome: Progressing Goal: Ability to verbalize concerns, feelings, and thoughts to partner or family member will improve Outcome: Progressing   Problem: Pain Management: Goal: Satisfaction with pain management regimen will improve Outcome: Progressing   Problem: Activity: Goal: Ability to tolerate increased activity will improve Outcome: Progressing   Problem: Clinical Measurements: Goal: Ability to maintain a body temperature in the normal range will improve Outcome: Progressing   Problem: Respiratory: Goal: Ability to maintain adequate ventilation will improve Outcome: Progressing Goal: Ability to maintain a clear airway will improve Outcome: Progressing

## 2023-11-06 NOTE — Progress Notes (Signed)
Palliative Care Progress Note, Assessment & Plan   Patient Name: Shawn [name]       Date: 11/06/2023 DOB: 1961/05/03  Age: 63 y.o. MRN#: [id number] Attending Physician: Loyce Dys, MD Primary Care Physician: [facility] Admit Date: 11/05/2023  Subjective: Patient is lying in bed.  He is in mild distress.  His legs have mild spasms.  He is pointing to his legs and endorsing pain.  RN is at bedside.  No family or friends present during my visit.  HPI: 63 y.o. male  with past medical history of ALS, CAD, HLD, history of CVA admitted on 11/05/2023 with weakness. Patient is being treated for mild sepsis and likely aspiration pneumonia in setting of PEG tube.     As per ED Dr. Alvester Morin, patient is active with [facility] hospice and is wishing to go home.   PMT was consulted to support patient and goals of care and medical decision making.  Summary of counseling/coordination of care: Extensive chart review completed prior to meeting patient including labs, vital signs, imaging, progress notes, orders, and available advanced directive documents from current and previous encounters.   After reviewing the patient's chart and assessing the patient at bedside, I spoke with patient in regards to symptom management and goals of care.   Symptoms assessed.  Patient is able to communicate clearly with yes/no answers.  Through a series of yes/no questions, patient endorses pain and muscle tightness in his lower extremities.  I confirmed with the patient that PTA patient was taking Dilaudid and fentanyl.  He shook his head no and waved his finger no to me when we discussed morphine.  In review of patient's PDMP, patient was using fentanyl patch in conjunction with alprazolam.  I discussed with attending  who is in agreement to restart these medications.  Orders placed.  Additionally, I modified his IV Dilaudid so it can be given every 2 hours instead of every 4 to more appropriately manage patient's pain while fentanyl patch is taking effect.  Additionally, patient placed on comfort feeds with understanding that food is given when patient is awake, alert, and asking for it.  Attending aware of all adjustments to [facility] and remains in agreement with these changes.  RN Gavin Pound was present during my discussion with patient.  He was able to appropriately communicate the reason for his admission, his preferred treatment options, the benefits and risks of accepting and not accepting treatment.  From our discussion, patient does not present with impairments in memory, attention span, or intelligence.  Patient is not experiencing any acute medical scenario that I believe has compromised his mental capacity.  Patient conveyed that he has ALS, he wishes to longer receive aggressive treatments for it, that he would like to be kept comfortable and free from pain, and that he would like to return home soon as possible.  These are the same sentiments that patient shared with me yesterday.    At this time, there is insufficient evidence to warrant removal of patient's rights for independent medical decision making.  He has been clear with his goals and wishes for this hospitalization as well as his discharge plan.  Extensive discussion with TOC,  RN, and attending in regards to next steps for patient.  From my assessment both yesterday and today, patient has capacity and ability to make medical decision making independently at this time.    PMT will continue to follow and support patient throughout his hospitalization.  TOC following closely for discharge planning.  Physical Exam Vitals reviewed.  Constitutional:      General: He is not in acute distress.    Appearance: He is not toxic-appearing.  HENT:     Head:  Normocephalic.     Mouth/Throat:     Mouth: Mucous membranes are moist.  Cardiovascular:     Rate and Rhythm: Normal rate.  Pulmonary:     Effort: Pulmonary effort is normal.  Musculoskeletal:     Comments: Generalized weakness  Skin:    General: Skin is warm and dry.  Neurological:     Mental Status: He is alert and oriented to person, place, and time.  Psychiatric:        Behavior: Behavior is not agitated.             Total Time 120 minutes   Time spent includes: Detailed review of medical records (labs, imaging, vital signs), medically appropriate exam (mental status, respiratory, cardiac, skin), discussed with treatment team, counseling and educating patient, family and staff, documenting clinical information, medication management and coordination of care.  Samara Deist L. Bonita Quin, DNP, FNP-BC Palliative Medicine Team

## 2023-11-06 NOTE — ED Notes (Signed)
Pt was a 2 person assist to the chair and back

## 2023-11-07 DIAGNOSIS — Z515 Encounter for palliative care: Secondary | ICD-10-CM

## 2023-11-07 DIAGNOSIS — G1221 Amyotrophic lateral sclerosis: Secondary | ICD-10-CM | POA: Diagnosis not present

## 2023-11-07 DIAGNOSIS — Z931 Gastrostomy status: Secondary | ICD-10-CM

## 2023-11-07 DIAGNOSIS — J690 Pneumonitis due to inhalation of food and vomit: Secondary | ICD-10-CM | POA: Diagnosis not present

## 2023-11-07 MED ORDER — METHOCARBAMOL 1000 MG/10ML IJ SOLN
500.0000 mg | Freq: Four times a day (QID) | INTRAMUSCULAR | Status: DC | PRN
  Administered 2023-11-07: 500 mg via INTRAVENOUS
  Filled 2023-11-07 (×2): qty 5

## 2023-11-07 MED ORDER — DIPHENHYDRAMINE HCL 50 MG/ML IJ SOLN: 12.5000 mg | Freq: Four times a day (QID) | INTRAMUSCULAR | Status: DC | PRN

## 2023-11-07 MED ORDER — HYDROMORPHONE 1 MG/ML IV SOLN
INTRAVENOUS | Status: DC
Start: 2023-11-07 — End: 2023-11-07
  Filled 2023-11-07 (×3): qty 30

## 2023-11-07 MED ORDER — SODIUM CHLORIDE 0.9% FLUSH: 9.0000 mL | INTRAVENOUS | Status: DC | PRN

## 2023-11-07 MED ORDER — NALOXONE HCL 0.4 MG/ML IJ SOLN: 0.4000 mg | INTRAMUSCULAR | Status: DC | PRN

## 2023-11-07 MED ORDER — DIPHENHYDRAMINE HCL 12.5 MG/5ML PO ELIX: 12.5000 mg | ORAL_SOLUTION | Freq: Four times a day (QID) | ORAL | Status: DC | PRN

## 2023-11-07 MED ORDER — HYDROMORPHONE HCL-NACL 50-0.9 MG/50ML-% IV SOLN
1.0000 mg/h | INTRAVENOUS | Status: DC
  Administered 2023-11-07: 1 mg/h via INTRAVENOUS
  Filled 2023-11-07: qty 50

## 2023-11-07 NOTE — Progress Notes (Signed)
 PROGRESS NOTE    Shawn [name]  FMW:[id number] DOB: 1961-08-30 DOA: 11/05/2023 PCP: [facility]  Chief Complaint  Patient presents with   Cough   Shortness of Breath    Hospital Course:  AVIAN KONIGSBERG is 63 y.o. male with ALS, CAD, hyperlipidemia, history of prior CVA, T12 compression fracture, who presented with aspiration pneumonia.  History was provided from staff and patient's mother as patient is nonverbal at baseline secondary to ALS.  Patient has a PEG tube at baseline and was recently evaluated at [facility] due to issues with this tube.  Appears he was still tolerating some p.o. fluids until recently.  Per report patient was actively on hospice at home.  Palliative care was consulted and ultimately family made decision to transition to comfort measures only.  Subjective: No acute events overnight. On evaluation today patient complains on some muscle spasms   Objective: Vitals:   11/06/23 1710 11/06/23 2049 11/06/23 2121 11/07/23 0750  BP: 115/76  122/78 115/69  Pulse: 91  91 80  Resp: 18  18 15   Temp: 97.6 F (36.4 C)  97.9 F (36.6 C) 98.1 F (36.7 C)  TempSrc:      SpO2: 97%  97% 95%  Weight:  51.8 kg    Height:  5' 10 (1.778 m)      Intake/Output Summary (Last 24 hours) at 11/07/2023 0826 Last data filed at 11/07/2023 0505 Gross per 24 hour  Intake 240 ml  Output 300 ml  Net -60 ml   Filed Weights   11/06/23 2049  Weight: 51.8 kg    Examination: General exam: Appears calm and comfortable, NAD, thin Respiratory system: No work of breathing, symmetric chest wall expansion Cardiovascular system: S1 & S2 heard, RRR.  Gastrointestinal system: Abdomen is nondistended, soft and nontender.  Neuro: Alert, nonverbal, communicates with pen and paper Extremities: Symmetric Skin: No rashes, lesions Psychiatry: Calm. Difficult to assess  Assessment & Plan:  Principal Problem:   Aspiration pneumonia (HCC) Active Problems:   S/P percutaneous  endoscopic gastrostomy (PEG) tube placement (HCC)   ALS (amyotrophic lateral sclerosis) (HCC)   Asthma, chronic   Comfort measures only status   Palliative care by specialist    Comfort measures only - Palliative care consulted and assisting with symptom management - Continue home fentanyl  patch   - Will continue with as needed Dilaudid , glycopyrrolate , alprazolam   Aspiration pneumonia - Patient has now been transferred to comfort measures only as above, will discontinue further antibiotic therapy as directed by the family  Status post percutaneous endoscopy gastrotomy tube placement - Has recently been seen by [facility] for PEG tube malfunction and was due for IR PEG exchange - Given he is now comfort measures only we will discontinue PEG feeds and proceed with comfort feeds only  Asthma, chronic Amyotrophic lateral sclerosis History of CVA Hyperlipidemia CAD - Discontinue all medications that do not immediately provide the patient comfort  DVT prophylaxis: n/a   Code Status: Do not attempt resuscitation (DNR) - Comfort care Family Communication: Discussed directly with patient Disposition:  Status is: Inpatient, pending outpatient hospice arrangements.    Consultants:  Palliative  Procedures:    Antimicrobials:  Anti-infectives (From admission, onward)    Start     Dose/Rate Route Frequency Ordered Stop   11/06/23 0530  cefTRIAXone  (ROCEPHIN ) 2 g in sodium chloride  0.9 % 100 mL IVPB  Status:  Discontinued        2 g 200 mL/hr over 30 Minutes Intravenous  Every 24 hours 11/05/23 0726 11/05/23 1135   11/05/23 1700  metroNIDAZOLE  (FLAGYL ) IVPB 500 mg  Status:  Discontinued        500 mg 100 mL/hr over 60 Minutes Intravenous Every 12 hours 11/05/23 0727 11/05/23 1135   11/05/23 0730  metroNIDAZOLE  (FLAGYL ) IVPB 500 mg  Status:  Discontinued        500 mg 100 mL/hr over 60 Minutes Intravenous Every 12 hours 11/05/23 0726 11/05/23 0727   11/05/23 0530  cefTRIAXone  (ROCEPHIN )  2 g in sodium chloride  0.9 % 100 mL IVPB        2 g 200 mL/hr over 30 Minutes Intravenous  Once 11/05/23 0522 11/05/23 0655   11/05/23 0530  metroNIDAZOLE  (FLAGYL ) IVPB 500 mg        500 mg 100 mL/hr over 60 Minutes Intravenous  Once 11/05/23 0522 11/05/23 0655       Data Reviewed: I have personally reviewed following labs and imaging studies CBC: Recent Labs  Lab 11/05/23 0403  WBC 5.5  HGB 13.6  HCT 40.5  MCV 99.5  PLT 473*   Basic Metabolic Panel: Recent Labs  Lab 11/05/23 0403  NA 144  K 3.5  CL 108  CO2 25  GLUCOSE 107*  BUN 24*  CREATININE 0.56*  CALCIUM  9.6   GFR: Estimated Creatinine Clearance: 68.3 mL/min (A) (by C-G formula based on SCr of 0.56 mg/dL (L)). Liver Function Tests: No results for input(s): AST, ALT, ALKPHOS, BILITOT, PROT, ALBUMIN in the last 168 hours. CBG: No results for input(s): GLUCAP in the last 168 hours.  Recent Results (from the past 240 hours)  Blood culture (routine x 2)     Status: None (Preliminary result)   Collection Time: 11/05/23  4:02 AM   Specimen: BLOOD  Result Value Ref Range Status   Specimen Description BLOOD BLOOD RIGHT HAND  Final   Special Requests   Final    BOTTLES DRAWN AEROBIC AND ANAEROBIC Blood Culture adequate volume   Culture   Final    NO GROWTH 2 DAYS Performed at [facility], [address], [place], [place] [address]    Report Status PENDING  Incomplete  Culture, blood (Routine X 2) w Reflex to ID Panel     Status: None (Preliminary result)   Collection Time: 11/05/23  4:02 AM   Specimen: BLOOD  Result Value Ref Range Status   Specimen Description BLOOD BLOOD RIGHT WRIST  Final   Special Requests   Final    BOTTLES DRAWN AEROBIC AND ANAEROBIC Blood Culture adequate volume   Culture   Final    NO GROWTH 2 DAYS Performed at [facility], [address] [place], [place] [address]    Report Status PENDING  Incomplete  Group A Strep by PCR     Status: None    Collection Time: 11/05/23  4:03 AM   Specimen: Throat; Sterile Swab  Result Value Ref Range Status   Group A Strep by PCR NOT DETECTED NOT DETECTED Final    Comment: Performed at [facility], [address] [place], [place] [address]  Resp panel by RT-PCR (RSV, Flu A&B, Covid) Throat     Status: None   Collection Time: 11/05/23  4:03 AM   Specimen: Throat; Nasal Swab  Result Value Ref Range Status   SARS Coronavirus 2 by RT PCR NEGATIVE NEGATIVE Final    Comment: (NOTE) SARS-CoV-2 target nucleic acids are NOT DETECTED.  The SARS-CoV-2 RNA is generally detectable in  upper respiratory specimens during the acute phase of infection. The lowest concentration of SARS-CoV-2 viral copies this assay can detect is 138 copies/mL. A negative result does not preclude SARS-Cov-2 infection and should not be used as the sole basis for treatment or other patient management decisions. A negative result may occur with  improper specimen collection/handling, submission of specimen other than nasopharyngeal swab, presence of viral mutation(s) within the areas targeted by this assay, and inadequate number of viral copies(<138 copies/mL). A negative result must be combined with clinical observations, patient history, and epidemiological information. The expected result is Negative.  Fact Sheet for Patients:  bloggercourse.com  Fact Sheet for Healthcare Providers:  seriousbroker.it  This test is no t yet approved or cleared by the [place]  FDA and  has been authorized for detection and/or diagnosis of SARS-CoV-2 by FDA under an Emergency Use Authorization (EUA). This EUA will remain  in effect (meaning this test can be used) for the duration of the COVID-19 declaration under Section 564(b)(1) of the Act, 21 U.S.C.section 360bbb-3(b)(1), unless the authorization is terminated  or revoked sooner.       Influenza A by PCR NEGATIVE  NEGATIVE Final   Influenza B by PCR NEGATIVE NEGATIVE Final    Comment: (NOTE) The Xpert Xpress SARS-CoV-2/FLU/RSV plus assay is intended as an aid in the diagnosis of influenza from Nasopharyngeal swab specimens and should not be used as a sole basis for treatment. Nasal washings and aspirates are unacceptable for Xpert Xpress SARS-CoV-2/FLU/RSV testing.  Fact Sheet for Patients: bloggercourse.com  Fact Sheet for Healthcare Providers: seriousbroker.it  This test is not yet approved or cleared by the [place]  FDA and has been authorized for detection and/or diagnosis of SARS-CoV-2 by FDA under an Emergency Use Authorization (EUA). This EUA will remain in effect (meaning this test can be used) for the duration of the COVID-19 declaration under Section 564(b)(1) of the Act, 21 U.S.C. section 360bbb-3(b)(1), unless the authorization is terminated or revoked.     Resp Syncytial Virus by PCR NEGATIVE NEGATIVE Final    Comment: (NOTE) Fact Sheet for Patients: bloggercourse.com  Fact Sheet for Healthcare Providers: seriousbroker.it  This test is not yet approved or cleared by the [place]  FDA and has been authorized for detection and/or diagnosis of SARS-CoV-2 by FDA under an Emergency Use Authorization (EUA). This EUA will remain in effect (meaning this test can be used) for the duration of the COVID-19 declaration under Section 564(b)(1) of the Act, 21 U.S.C. section 360bbb-3(b)(1), unless the authorization is terminated or revoked.  Performed at [facility], [address], [place], [place] [address]      Radiology Studies: No results found.  Scheduled Meds:  fentaNYL   1 patch Transdermal Q72H   Continuous Infusions:   LOS: 2 days    Time spent:  55min  Kameah Rawl, DO [facility]  To contact the attending physician between 7A-7P  please use Epic Chat. To contact the covering physician during after hours 7P-7A, please review Amion.   11/07/2023, 8:26 AM   *This document has been created with the assistance of dictation software. Please excuse typographical errors. *

## 2023-11-07 NOTE — Progress Notes (Signed)
                                                     Palliative Care Progress Note, Assessment & Plan   Patient Name: Shawn [name]       Date: 11/07/2023 DOB: 01/24/1961  Age: 63 y.o. MRN#: [id number] Attending Physician: Leesa Kast, DO Primary Care Physician: [facility] Admit Date: 11/05/2023  Subjective: Patient is lying in bed in no apparent distress.  He acknowledges my presence.  He cannot make vocalizations but is able to make his wishes known with appropriately answering yes/no questions.  He complains of muscle tightness in his lower extremities.  They are not tremoring at this moment but he endorses that they do spasm and shake.  He denies pain at this time.  No family or friends present during my visit.  HPI: 63 y.o. male  with past medical history of ALS, CAD, HLD, history of CVA admitted on 11/05/2023 with weakness. Patient is being treated for mild sepsis and likely aspiration pneumonia in setting of PEG tube.     As per ED Dr. Eldonna, patient is active with [facility] hosp[facility] and is wishing to go home.   PMT was consulted to support patient and goals of care and medical decision making.  Summary of counseling/coordination of care: Extensive chart review completed prior to meeting patient including labs, vital signs, imaging, progress notes, orders, and available advanced directive documents from current and previous encounters.   After reviewing the patient's chart and assessing the patient at bedside, I spoke with patient in regards to symptom management and goals of care.   After reviewing MAR and assessing patient, I adjusted medications and added Robaxin  IV.  Discussed with patient he is in agreement for Robaxin  to be given.  He also endorses that he is having difficulty swallowing pills and would prefer all  medications to be given.  Medications available I will have alternate forms for IV administration.  No further adjustment to [facility] needed.  Full comfort measures continue.  TOC following closely for discharge planning.  Physical Exam Constitutional:      General: He is not in acute distress. Cardiovascular:     Rate and Rhythm: Normal rate.  Pulmonary:     Effort: Pulmonary effort is normal.  Skin:    General: Skin is warm and dry.  Neurological:     Mental Status: He is alert and oriented to person, place, and time.  Psychiatric:        Mood and Affect: Mood is not anxious.        Behavior: Behavior is not agitated.             Total Time 35 minutes   Time spent includes: Detailed review of medical records (labs, imaging, vital signs), medically appropriate exam (mental status, respiratory, cardiac, skin), discussed with treatment team, counseling and educating patient, family and staff, documenting clinical information, medication management and coordination of care.  Lamarr L. Arvid, DNP, FNP-BC Palliative Medicine Team

## 2023-11-07 NOTE — Plan of Care (Signed)

## 2023-11-07 NOTE — Plan of Care (Signed)
  Problem: Education: Goal: Knowledge of General Education information will improve Description: Including pain rating scale, medication(s)/side effects and non-pharmacologic comfort measures Outcome: Progressing   Problem: Health Behavior/Discharge Planning: Goal: Ability to manage health-related needs will improve Outcome: Progressing   Problem: Clinical Measurements: Goal: Ability to maintain clinical measurements within normal limits will improve Outcome: Progressing Goal: Will remain free from infection Outcome: Progressing Goal: Diagnostic test results will improve Outcome: Progressing Goal: Respiratory complications will improve Outcome: Progressing Goal: Cardiovascular complication will be avoided Outcome: Progressing   Problem: Activity: Goal: Risk for activity intolerance will decrease Outcome: Progressing   Problem: Nutrition: Goal: Adequate nutrition will be maintained Outcome: Progressing   Problem: Coping: Goal: Level of anxiety will decrease Outcome: Progressing   Problem: Elimination: Goal: Will not experience complications related to bowel motility Outcome: Progressing Goal: Will not experience complications related to urinary retention Outcome: Progressing   Problem: Pain Managment: Goal: General experience of comfort will improve and/or be controlled Outcome: Progressing   Problem: Safety: Goal: Ability to remain free from injury will improve Outcome: Progressing   Problem: Skin Integrity: Goal: Risk for impaired skin integrity will decrease Outcome: Progressing   Problem: Education: Goal: Knowledge of the prescribed therapeutic regimen will improve Outcome: Progressing   Problem: Coping: Goal: Ability to identify and develop effective coping behavior will improve Outcome: Progressing   Problem: Clinical Measurements: Goal: Quality of life will improve Outcome: Progressing   Problem: Respiratory: Goal: Verbalizations of increased ease  of respirations will increase Outcome: Progressing   Problem: Role Relationship: Goal: Family's ability to cope with current situation will improve Outcome: Progressing Goal: Ability to verbalize concerns, feelings, and thoughts to partner or family member will improve Outcome: Progressing   Problem: Pain Management: Goal: Satisfaction with pain management regimen will improve Outcome: Progressing   Problem: Activity: Goal: Ability to tolerate increased activity will improve Outcome: Progressing   Problem: Clinical Measurements: Goal: Ability to maintain a body temperature in the normal range will improve Outcome: Progressing   Problem: Respiratory: Goal: Ability to maintain adequate ventilation will improve Outcome: Progressing Goal: Ability to maintain a clear airway will improve Outcome: Progressing

## 2023-11-08 DIAGNOSIS — J4520 Mild intermittent asthma, uncomplicated: Secondary | ICD-10-CM

## 2023-11-08 DIAGNOSIS — Z7189 Other specified counseling: Secondary | ICD-10-CM

## 2023-11-08 DIAGNOSIS — G1221 Amyotrophic lateral sclerosis: Secondary | ICD-10-CM | POA: Diagnosis not present

## 2023-11-08 DIAGNOSIS — Z515 Encounter for palliative care: Secondary | ICD-10-CM | POA: Diagnosis not present

## 2023-11-08 MED ORDER — HYDROMORPHONE HCL-NACL 50-0.9 MG/50ML-% IV SOLN
1.0000 mg/h | INTRAVENOUS | Status: DC
  Administered 2023-11-09: 1 mg/h via INTRAVENOUS
  Filled 2023-11-08 (×2): qty 50

## 2023-11-08 MED ORDER — METHOCARBAMOL 1000 MG/10ML IJ SOLN
500.0000 mg | Freq: Three times a day (TID) | INTRAMUSCULAR | Status: DC
  Administered 2023-11-08: 500 mg via INTRAVENOUS
  Filled 2023-11-08 (×2): qty 5

## 2023-11-08 MED ORDER — METHOCARBAMOL 1000 MG/10ML IJ SOLN
500.0000 mg | Freq: Four times a day (QID) | INTRAMUSCULAR | Status: DC | PRN
  Administered 2023-11-08 – 2023-11-09 (×3): 500 mg via INTRAVENOUS
  Filled 2023-11-08 (×3): qty 5

## 2023-11-08 MED ORDER — DIAZEPAM 5 MG/ML IJ SOLN
2.5000 mg | Freq: Once | INTRAMUSCULAR | Status: AC
  Administered 2023-11-08: 2.5 mg via INTRAVENOUS
  Filled 2023-11-08: qty 2

## 2023-11-08 MED ORDER — LORAZEPAM 2 MG/ML IJ SOLN
1.0000 mg | Freq: Once | INTRAMUSCULAR | Status: AC
  Administered 2023-11-08: 1 mg via INTRAVENOUS
  Filled 2023-11-08: qty 1

## 2023-11-08 NOTE — Care Management Important Message (Signed)
 Important Message  Patient Details  Name: Shawn [name] MRN: [id number] Date of Birth: [birth year]   Important Message Given:  Yes - Medicare IM     Zeev Deakins W, CMA 11/08/2023, 11:41 AM

## 2023-11-08 NOTE — Plan of Care (Signed)
  Problem: Education: Goal: Knowledge of General Education information will improve Description: Including pain rating scale, medication(s)/side effects and non-pharmacologic comfort measures Outcome: Not Progressing   Problem: Health Behavior/Discharge Planning: Goal: Ability to manage health-related needs will improve Outcome: Not Progressing   Problem: Clinical Measurements: Goal: Ability to maintain clinical measurements within normal limits will improve Outcome: Not Progressing Goal: Will remain free from infection Outcome: Not Progressing Goal: Diagnostic test results will improve Outcome: Not Progressing Goal: Respiratory complications will improve Outcome: Not Progressing Goal: Cardiovascular complication will be avoided Outcome: Not Progressing   Problem: Activity: Goal: Risk for activity intolerance will decrease Outcome: Not Progressing   Problem: Nutrition: Goal: Adequate nutrition will be maintained Outcome: Not Progressing   Problem: Coping: Goal: Level of anxiety will decrease Outcome: Not Progressing   Problem: Elimination: Goal: Will not experience complications related to bowel motility Outcome: Not Progressing Goal: Will not experience complications related to urinary retention Outcome: Not Progressing   Problem: Pain Managment: Goal: General experience of comfort will improve and/or be controlled Outcome: Not Progressing   Problem: Safety: Goal: Ability to remain free from injury will improve Outcome: Not Progressing   Problem: Skin Integrity: Goal: Risk for impaired skin integrity will decrease Outcome: Not Progressing   Problem: Education: Goal: Knowledge of the prescribed therapeutic regimen will improve Outcome: Not Progressing   Problem: Coping: Goal: Ability to identify and develop effective coping behavior will improve Outcome: Not Progressing   Problem: Clinical Measurements: Goal: Quality of life will improve Outcome: Not  Progressing   Problem: Respiratory: Goal: Verbalizations of increased ease of respirations will increase Outcome: Not Progressing   Problem: Role Relationship: Goal: Family's ability to cope with current situation will improve Outcome: Not Progressing Goal: Ability to verbalize concerns, feelings, and thoughts to partner or family member will improve Outcome: Not Progressing   Problem: Pain Management: Goal: Satisfaction with pain management regimen will improve Outcome: Not Progressing   Problem: Activity: Goal: Ability to tolerate increased activity will improve Outcome: Not Progressing   Problem: Clinical Measurements: Goal: Ability to maintain a body temperature in the normal range will improve Outcome: Not Progressing   Problem: Respiratory: Goal: Ability to maintain adequate ventilation will improve Outcome: Not Progressing Goal: Ability to maintain a clear airway will improve Outcome: Not Progressing

## 2023-11-08 NOTE — Progress Notes (Signed)
 [facility] Liaison Note  Received a referral from [facility], Nichole Jobs, RN, to evaluate patient for the Hospice Home.  Spoke briefly with patient's mother this evening.  She is interested in discussing services at the [facility] and at a LTC facility.  HL to call her at 9:30am tomorrow to discuss in more detail.     Please don't hesitate to call with any Hospice related questions or concerns.    Thank you for the opportunity to participate in this patient's care. [facility] Liaison [phone]

## 2023-11-08 NOTE — Progress Notes (Signed)
 PROGRESS NOTE    Shawn [name]  FMW:[id number] DOB: May 15, 1961 DOA: 11/05/2023 PCP: [facility]  Chief Complaint  Patient presents with   Cough   Shortness of Breath    Hospital Course:  Shawn [name] is 63 y.o. male with ALS, CAD, hyperlipidemia, history of prior CVA, T12 compression fracture, who presented with aspiration pneumonia.  History was provided from staff and patient's mother as patient is nonverbal at baseline secondary to ALS.  Patient has a PEG tube at baseline and was recently evaluated at [facility] due to issues with this tube.  Appears he was still tolerating some p.o. fluids until recently.  Per report patient was actively on hospice at home.  Palliative care was consulted and ultimately family made decision to transition to comfort measures only.  Subjective: No acute events overnight. On evaluation today patient requests increase in dilaudid  drip.  Objective: Vitals:   11/06/23 2121 11/07/23 0750 11/07/23 1736 11/08/23 0836  BP: 122/78 115/69 126/84 119/79  Pulse: 91 80 83 85  Resp: 18 15 14 18   Temp: 97.9 F (36.6 C) 98.1 F (36.7 C) 98.9 F (37.2 C) 98.2 F (36.8 C)  TempSrc:      SpO2: 97% 95% 97% 97%  Weight:      Height:        Intake/Output Summary (Last 24 hours) at 11/08/2023 1400 Last data filed at 11/08/2023 0458 Gross per 24 hour  Intake 8.35 ml  Output 175 ml  Net -166.65 ml   Filed Weights   11/06/23 2049  Weight: 51.8 kg    Examination: General exam: Appears calm and comfortable, NAD, thin Respiratory system: No work of breathing, symmetric chest wall expansion Cardiovascular system: S1 & S2 heard, RRR.  Gastrointestinal system: Abdomen is nondistended, soft and nontender.  Neuro: Alert, nonverbal, communicates with pen and paper Extremities: Symmetric Skin: No rashes, lesions Psychiatry: Calm. Difficult to assess  Assessment & Plan:  Principal Problem:   Aspiration pneumonia (HCC) Active Problems:   S/P  percutaneous endoscopic gastrostomy (PEG) tube placement (HCC)   ALS (amyotrophic lateral sclerosis) (HCC)   Asthma, chronic   Comfort measures only status   Palliative care by specialist    Comfort measures only - Palliative care consulted and assisting with symptom management - Continue home fentanyl  patch, dilaudid  drip - Will continue with as needed glycopyrrolate , alprazolam   Aspiration pneumonia - Patient has now been transferred to comfort measures only as above, will discontinue further antibiotic therapy as directed by the family  Status post percutaneous endoscopy gastrotomy tube placement - Has recently been seen by [facility] for PEG tube malfunction and was due for IR PEG exchange - Given he is now comfort measures only we will discontinue PEG feeds and proceed with comfort feeds only  Asthma, chronic Amyotrophic lateral sclerosis History of CVA Hyperlipidemia CAD - Discontinue all medications that do not immediately provide the patient comfort  DVT prophylaxis: n/a   Code Status: Do not attempt resuscitation (DNR) - Comfort care Family Communication: Discussed directly with patient Disposition:  Status is: Inpatient, pending outpatient hospice arrangements. TOC aware and working on it.     Consultants:  Palliative  Procedures:    Antimicrobials:  Anti-infectives (From admission, onward)    Start     Dose/Rate Route Frequency Ordered Stop   11/06/23 0530  cefTRIAXone  (ROCEPHIN ) 2 g in sodium chloride  0.9 % 100 mL IVPB  Status:  Discontinued        2 g 200 mL/hr  over 30 Minutes Intravenous Every 24 hours 11/05/23 0726 11/05/23 1135   11/05/23 1700  metroNIDAZOLE  (FLAGYL ) IVPB 500 mg  Status:  Discontinued        500 mg 100 mL/hr over 60 Minutes Intravenous Every 12 hours 11/05/23 0727 11/05/23 1135   11/05/23 0730  metroNIDAZOLE  (FLAGYL ) IVPB 500 mg  Status:  Discontinued        500 mg 100 mL/hr over 60 Minutes Intravenous Every 12 hours 11/05/23 0726 11/05/23  0727   11/05/23 0530  cefTRIAXone  (ROCEPHIN ) 2 g in sodium chloride  0.9 % 100 mL IVPB        2 g 200 mL/hr over 30 Minutes Intravenous  Once 11/05/23 0522 11/05/23 0655   11/05/23 0530  metroNIDAZOLE  (FLAGYL ) IVPB 500 mg        500 mg 100 mL/hr over 60 Minutes Intravenous  Once 11/05/23 0522 11/05/23 0655       Data Reviewed: I have personally reviewed following labs and imaging studies CBC: Recent Labs  Lab 11/05/23 0403  WBC 5.5  HGB 13.6  HCT 40.5  MCV 99.5  PLT 473*   Basic Metabolic Panel: Recent Labs  Lab 11/05/23 0403  NA 144  K 3.5  CL 108  CO2 25  GLUCOSE 107*  BUN 24*  CREATININE 0.56*  CALCIUM  9.6   GFR: Estimated Creatinine Clearance: 68.3 mL/min (A) (by C-G formula based on SCr of 0.56 mg/dL (L)). Liver Function Tests: No results for input(s): AST, ALT, ALKPHOS, BILITOT, PROT, ALBUMIN in the last 168 hours. CBG: No results for input(s): GLUCAP in the last 168 hours.  Recent Results (from the past 240 hours)  Blood culture (routine x 2)     Status: None (Preliminary result)   Collection Time: 11/05/23  4:02 AM   Specimen: BLOOD  Result Value Ref Range Status   Specimen Description BLOOD BLOOD RIGHT HAND  Final   Special Requests   Final    BOTTLES DRAWN AEROBIC AND ANAEROBIC Blood Culture adequate volume   Culture   Final    NO GROWTH 3 DAYS Performed at [facility], [address], [place], [place] [address]    Report Status PENDING  Incomplete  Culture, blood (Routine X 2) w Reflex to ID Panel     Status: None (Preliminary result)   Collection Time: 11/05/23  4:02 AM   Specimen: BLOOD  Result Value Ref Range Status   Specimen Description BLOOD BLOOD RIGHT WRIST  Final   Special Requests   Final    BOTTLES DRAWN AEROBIC AND ANAEROBIC Blood Culture adequate volume   Culture   Final    NO GROWTH 3 DAYS Performed at [facility], [address] [place], [place] [address]    Report Status PENDING   Incomplete  Group A Strep by PCR     Status: None   Collection Time: 11/05/23  4:03 AM   Specimen: Throat; Sterile Swab  Result Value Ref Range Status   Group A Strep by PCR NOT DETECTED NOT DETECTED Final    Comment: Performed at [facility], [address] [place], [place] [address]  Resp panel by RT-PCR (RSV, Flu A&B, Covid) Throat     Status: None   Collection Time: 11/05/23  4:03 AM   Specimen: Throat; Nasal Swab  Result Value Ref Range Status   SARS Coronavirus 2 by RT PCR NEGATIVE NEGATIVE Final    Comment: (NOTE) SARS-CoV-2 target nucleic acids are NOT DETECTED.  The SARS-CoV-2 RNA  is generally detectable in upper respiratory specimens during the acute phase of infection. The lowest concentration of SARS-CoV-2 viral copies this assay can detect is 138 copies/mL. A negative result does not preclude SARS-Cov-2 infection and should not be used as the sole basis for treatment or other patient management decisions. A negative result may occur with  improper specimen collection/handling, submission of specimen other than nasopharyngeal swab, presence of viral mutation(s) within the areas targeted by this assay, and inadequate number of viral copies(<138 copies/mL). A negative result must be combined with clinical observations, patient history, and epidemiological information. The expected result is Negative.  Fact Sheet for Patients:  bloggercourse.com  Fact Sheet for Healthcare Providers:  seriousbroker.it  This test is no t yet approved or cleared by the [place]  FDA and  has been authorized for detection and/or diagnosis of SARS-CoV-2 by FDA under an Emergency Use Authorization (EUA). This EUA will remain  in effect (meaning this test can be used) for the duration of the COVID-19 declaration under Section 564(b)(1) of the Act, 21 U.S.C.section 360bbb-3(b)(1), unless the authorization is terminated  or  revoked sooner.       Influenza A by PCR NEGATIVE NEGATIVE Final   Influenza B by PCR NEGATIVE NEGATIVE Final    Comment: (NOTE) The Xpert Xpress SARS-CoV-2/FLU/RSV plus assay is intended as an aid in the diagnosis of influenza from Nasopharyngeal swab specimens and should not be used as a sole basis for treatment. Nasal washings and aspirates are unacceptable for Xpert Xpress SARS-CoV-2/FLU/RSV testing.  Fact Sheet for Patients: bloggercourse.com  Fact Sheet for Healthcare Providers: seriousbroker.it  This test is not yet approved or cleared by the [place]  FDA and has been authorized for detection and/or diagnosis of SARS-CoV-2 by FDA under an Emergency Use Authorization (EUA). This EUA will remain in effect (meaning this test can be used) for the duration of the COVID-19 declaration under Section 564(b)(1) of the Act, 21 U.S.C. section 360bbb-3(b)(1), unless the authorization is terminated or revoked.     Resp Syncytial Virus by PCR NEGATIVE NEGATIVE Final    Comment: (NOTE) Fact Sheet for Patients: bloggercourse.com  Fact Sheet for Healthcare Providers: seriousbroker.it  This test is not yet approved or cleared by the [place]  FDA and has been authorized for detection and/or diagnosis of SARS-CoV-2 by FDA under an Emergency Use Authorization (EUA). This EUA will remain in effect (meaning this test can be used) for the duration of the COVID-19 declaration under Section 564(b)(1) of the Act, 21 U.S.C. section 360bbb-3(b)(1), unless the authorization is terminated or revoked.  Performed at [facility], [address], [place], [place] [address]      Radiology Studies: No results found.  Scheduled Meds:  fentaNYL   1 patch Transdermal Q72H   methocarbamol  (ROBAXIN ) injection  500 mg Intravenous Q8H   Continuous Infusions:  HYDROmorphone  0.5  mg/hr (11/08/23 1008)     LOS: 3 days    Time spent:  55min  Lemoine Goyne, DO [facility]  To contact the attending physician between 7A-7P please use Epic Chat. To contact the covering physician during after hours 7P-7A, please review Amion.   11/08/2023, 2:00 PM   *This document has been created with the assistance of dictation software. Please excuse typographical errors. *

## 2023-11-08 NOTE — Progress Notes (Signed)
       CROSS COVER NOTE  NAME: Shawn [name] MRN: [id number] DOB : 31-Aug-1961 ATTENDING PHYSICIAN: Dezii, Alexandra, DO    Date of Service   11/08/2023   HPI/Events of Note   Patient on comfort care. Patient having visisble muscle spasms. Robaxin  previously ordered. Also reporting need for sleep med as he was awake all previous night. One time dose of lorazepam  ineffective  Interventions   Assessment/Plan: Valium  2 mg x 1 for sleep Robaxin  for muscle spasms previously ordered       Erminio LITTIE Cone NP [facility] Cross Cover 7pm-7am - check amion for availability Pager [phone]

## 2023-11-08 NOTE — Progress Notes (Signed)
 Daily Progress Note   Patient Name: Shawn [name]       Date: 11/08/2023 DOB: 1961-09-14  Age: 63 y.o. MRN#: [id number] Attending Physician: Leesa Kast, DO Primary Care Physician: [facility] Admit Date: 11/05/2023  Reason for Consultation/Follow-up: Establishing goals of care  Subjective: Notes and labs reviewed.  Patient has been transition to comfort care.  Into see patient.  He is currently resting in bed with nurse at bedside.  He does not speak but shakes and nods his head.  He denies complaint at this time.  Medications are in place for comfort.  No changes to symptom management regimen made at this time.    Goals are set for comfort care and TOC continuing to work on discharge.  Length of Stay: 3  Current Medications: Scheduled Meds:   fentaNYL   1 patch Transdermal Q72H   methocarbamol  (ROBAXIN ) injection  500 mg Intravenous Q8H    Continuous Infusions:  HYDROmorphone  0.5 mg/hr (11/08/23 1008)    PRN Meds: acetaminophen  **OR** acetaminophen , ALPRAZolam , antiseptic oral rinse, glycopyrrolate  **OR** glycopyrrolate  **OR** glycopyrrolate , haloperidol  **OR** haloperidol  **OR** haloperidol  lactate, naloxone , ondansetron  **OR** ondansetron  (ZOFRAN ) IV, polyvinyl alcohol   Physical Exam Pulmonary:     Effort: Pulmonary effort is normal.  Neurological:     Mental Status: He is alert.             Vital Signs: BP 119/79 (BP Location: Left Arm)   Pulse 85   Temp 98.2 F (36.8 C)   Resp 18   Ht 5' 10 (1.778 m)   Wt 51.8 kg   SpO2 97%   BMI 16.39 kg/m  SpO2: SpO2: 97 % O2 Device: O2 Device: Nasal Cannula O2 Flow Rate: O2 Flow Rate (L/min): 2 L/min  Intake/output summary:  Intake/Output Summary (Last 24 hours) at 11/08/2023 1254 Last data filed at  11/08/2023 0458 Gross per 24 hour  Intake 8.35 ml  Output 175 ml  Net -166.65 ml   LBM: Last BM Date :  (nothing charted since admission) Baseline Weight: Weight: 51.8 kg Most recent weight: Weight: 51.8 kg        Patient Active Problem List   Diagnosis Date Noted   Comfort measures only status 11/06/2023   Palliative care by specialist 11/06/2023   Aspiration pneumonia (HCC) 11/05/2023   Malnutrition of moderate  degree 10/23/2023   Syncope 10/20/2023   Compression fracture of body of thoracic vertebra (HCC) 10/20/2023   S/P percutaneous endoscopic gastrostomy (PEG) tube placement (HCC) 10/20/2023   CAD (coronary artery disease) 10/20/2023   Cervical radicular pain 02/06/2023   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   Chronic right shoulder pain 02/06/2023   Chronic pain syndrome 06/28/2022   Pain management contract signed 06/28/2022   Closed compression fracture of body of L1 vertebra (HCC) 04/03/2022   Dyslipidemia 04/03/2022   Right leg pain 11/13/2021   COVID-19 virus infection 11/13/2021   Acute hypoxemic respiratory failure due to COVID-19 (HCC) 11/13/2021   Acute respiratory failure with hypoxia (HCC) 11/12/2021   Compression fracture of L2 (HCC) 11/07/2021   Lumbago 11/07/2021   Pain in both lower legs 11/07/2021   ALS (amyotrophic lateral sclerosis) (HCC) 11/07/2021   Asthma, chronic 11/07/2021   Ureteral stone 05/20/2015    Palliative Care Assessment & Plan     Recommendations/Plan: Goals are set for comfort care and TOC is working on discharge planning. Patient is comfortable at this time, no changes made to medication regimen.  Code Status:    Code Status Orders  (From admission, onward)           Start     Ordered   11/05/23 1136  Do not attempt resuscitation (DNR) - Comfort care  Continuous       Question Answer Comment  If patient has no pulse and is not breathing Do Not Attempt Resuscitation   In Pre-Arrest Conditions (Patient Is  Breathing and Has a Pulse) Provide comfort measures. Relieve any mechanical airway obstruction. Avoid transfer unless required for comfort.   Consent: Discussion documented in EHR or advanced directives reviewed      11/05/23 1135           Code Status History     Date Active Date Inactive Code Status Order ID Comments User Context   11/05/2023 0726 11/05/2023 1135 Limited: Do not attempt resuscitation (DNR) -DNR-LIMITED -Do Not Intubate/DNI  [id number]  Eldonna Elspeth PARAS, MD ED   10/20/2023 0820 10/23/2023 1828 Limited: Do not attempt resuscitation (DNR) -DNR-LIMITED -Do Not Intubate/DNI  [id number]  Eldonna Elspeth PARAS, MD ED   10/20/2023 0807 10/20/2023 0820 Full Code [id number]  Eldonna Elspeth PARAS, MD ED   04/03/2022 2115 04/05/2022 2024 DNR [id number]  Mansy, Madison LABOR, MD ED   11/12/2021 2358 11/14/2021 2308 DNR [id number]  Cox, Amy N, DO ED   11/12/2021 2317 11/12/2021 2358 Full Code [id number]  Cox, Amy N, DO ED   11/07/2021 1606 11/09/2021 1832 Full Code [id number]  Chotiner, Adine RAMAN, MD Inpatient   05/20/2015 1216 05/21/2015 0015 Full Code [id number]  Yisroel Sleight, MD ED      Advance Directive Documentation    Flowsheet Row Most Recent Value  Type of Advance Directive Out of facility DNR (pink MOST or yellow form)  Pre-existing out of facility DNR order (yellow form or pink MOST form) Pink MOST/Yellow Form most recent copy in chart - Physician notified to receive inpatient order  MOST Form in Place? --       Prognosis: Poor overall   Thank you for allowing the Palliative Medicine Team to assist in the care of this patient.    Camelia Lewis, NP  Please contact Palliative Medicine Team phone at [phone] for questions and concerns.

## 2023-11-09 DIAGNOSIS — G1221 Amyotrophic lateral sclerosis: Secondary | ICD-10-CM | POA: Diagnosis not present

## 2023-11-09 DIAGNOSIS — Z931 Gastrostomy status: Secondary | ICD-10-CM | POA: Diagnosis not present

## 2023-11-09 DIAGNOSIS — Z515 Encounter for palliative care: Secondary | ICD-10-CM | POA: Diagnosis not present

## 2023-11-09 DIAGNOSIS — Z7189 Other specified counseling: Secondary | ICD-10-CM | POA: Diagnosis not present

## 2023-11-09 MED ORDER — HYDROMORPHONE BOLUS VIA INFUSION
0.5000 mg | INTRAVENOUS | Status: DC | PRN
  Administered 2023-11-09: 0.5 mg via INTRAVENOUS

## 2023-11-09 MED ORDER — HYDROMORPHONE BOLUS VIA INFUSION
1.0000 mg | INTRAVENOUS | Status: DC | PRN
  Administered 2023-11-09: 1 mg via INTRAVENOUS

## 2023-11-09 MED ORDER — HYDROCORTISONE 1 % EX LOTN: TOPICAL_LOTION | Freq: Two times a day (BID) | CUTANEOUS | Status: DC | PRN

## 2023-11-09 NOTE — Progress Notes (Signed)
 Report called to Sarah RN at hospice, all questions answered.

## 2023-11-09 NOTE — Progress Notes (Signed)
 [facility] Liaison Note   Received request from Transitions of Care Manger for family interest in [facility].  Eligibility has been confirmed.  Met/Spoke with patient and family to confirm interest and explain services. Patient and Family agreeable to transfer today.  Transitions of Care manager aware.  RN please call report to [phone]  [facility]) prior to patient leaving the unit.  Please send signed and completed DNR with patient at discharge.  Thank you  Marinell Nova,  [facility] Liaison 336 [phone]

## 2023-11-09 NOTE — TOC Progression Note (Signed)
 Transition of Care [facility]) - Progression Note    Patient Details  Name: Shawn [name] MRN: [id number] Date of Birth: 31-Mar-1961  Transition of Care [facility]) CM/SW Contact  Lateesha Bezold A Bettymae Yott, RN Phone Number: 11/09/2023, 5:48 AM  Clinical Narrative:    Late entry for 02/06:  Chart reviewed.  Noted that patient was admitted for Aspiration PNA.  Patient now comfort care. Patient would like to be transferred to [facility].  I have informed Chiquita with [facility].  Chiquita informs me that [facility] does not have a Hospice Home and would have to refer to [facility] for possible Hospice Home.  Patient informed that [facility] does not have Hospice Home and would have to make referral for Hospice Home to [facility].  Patient is agreeable.  I have made referral to [facility] with [facility].  Patient is currently on IV Dilaudid  Drip for comfort.    TOC will continue to follow for discharge planning.   Expected Discharge Plan: Home w Hospice Care Barriers to Discharge: Continued Medical Work up  Expected Discharge Plan and Services     Post Acute Care Choice: Resumption of Svcs/PTA Provider Living arrangements for the past 2 months: Apartment                                       Social Determinants of Health (SDOH) Interventions SDOH Screenings   Food Insecurity: Patient Declined (11/05/2023)  Housing: Patient Declined (11/05/2023)  Transportation Needs: Patient Declined (11/05/2023)  Utilities: Patient Declined (11/05/2023)  Depression (PHQ2-9): Low Risk  (07/05/2023)  Social Connections: Patient Declined (11/05/2023)  Recent Concern: Social Connections - Moderately Isolated (10/20/2023)  Tobacco Use: Low Risk  (11/05/2023)    Readmission Risk Interventions     No data to display

## 2023-11-09 NOTE — Progress Notes (Signed)
 Daily Progress Note   Patient Name: Shawn [name]       Date: 11/09/2023 DOB: 09-11-1961  Age: 63 y.o. MRN#: [id number] Attending Physician: Leesa Kast, DO Primary Care Physician: [facility] Admit Date: 11/05/2023  Reason for Consultation/Follow-up: Establishing goals of care  Subjective: Notes reviewed. In to see patient. He is sitting in bed and requests medication for back pain via pen and paper. Mother is at bedside. She states she is here to complete forms for hospice facility.   Medication adjusted.    Length of Stay: 4  Current Medications: Scheduled Meds:   fentaNYL   1 patch Transdermal Q72H    Continuous Infusions:  HYDROmorphone  1 mg/hr (11/09/23 0336)    PRN Meds: acetaminophen  **OR** acetaminophen , ALPRAZolam , antiseptic oral rinse, glycopyrrolate  **OR** glycopyrrolate  **OR** glycopyrrolate , haloperidol  **OR** haloperidol  **OR** haloperidol  lactate, hydrocortisone , HYDROmorphone , methocarbamol  (ROBAXIN ) injection, naloxone , ondansetron  **OR** ondansetron  (ZOFRAN ) IV, polyvinyl alcohol   Physical Exam Pulmonary:     Effort: Pulmonary effort is normal.  Neurological:     Mental Status: He is alert.             Vital Signs: BP 112/77 (BP Location: Left Arm)   Pulse 73   Temp 97.6 F (36.4 C) (Oral)   Resp 16   Ht 5' 10 (1.778 m)   Wt 51.8 kg   SpO2 97%   BMI 16.39 kg/m  SpO2: SpO2: 97 % O2 Device: O2 Device: Nasal Cannula O2 Flow Rate: O2 Flow Rate (L/min): 2 L/min  Intake/output summary:  Intake/Output Summary (Last 24 hours) at 11/09/2023 1110 Last data filed at 11/09/2023 0352 Gross per 24 hour  Intake --  Output 150 ml  Net -150 ml   LBM: Last BM Date :  (no bm charted) Baseline Weight: Weight: 51.8 kg Most recent weight:  Weight: 51.8 kg         Patient Active Problem List   Diagnosis Date Noted   Comfort measures only status 11/06/2023   Palliative care by specialist 11/06/2023   Aspiration pneumonia (HCC) 11/05/2023   Malnutrition of moderate degree 10/23/2023   Syncope 10/20/2023   Compression fracture of body of thoracic vertebra (HCC) 10/20/2023   S/P percutaneous endoscopic gastrostomy (PEG) tube placement (HCC) 10/20/2023   CAD (coronary artery disease) 10/20/2023   Cervical radicular pain 02/06/2023  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   Chronic right shoulder pain 02/06/2023   Chronic pain syndrome 06/28/2022   Pain management contract signed 06/28/2022   Closed compression fracture of body of L1 vertebra (HCC) 04/03/2022   Dyslipidemia 04/03/2022   Right leg pain 11/13/2021   COVID-19 virus infection 11/13/2021   Acute hypoxemic respiratory failure due to COVID-19 (HCC) 11/13/2021   Acute respiratory failure with hypoxia (HCC) 11/12/2021   Compression fracture of L2 (HCC) 11/07/2021   Lumbago 11/07/2021   Pain in both lower legs 11/07/2021   ALS (amyotrophic lateral sclerosis) (HCC) 11/07/2021   Asthma, chronic 11/07/2021   Ureteral stone 05/20/2015    Palliative Care Assessment & Plan    Recommendations/Plan: Patient D/Cing to hospice facility.   Code Status:    Code Status Orders  (From admission, onward)           Start     Ordered   11/05/23 1136  Do not attempt resuscitation (DNR) - Comfort care  Continuous       Question Answer Comment  If patient has no pulse and is not breathing Do Not Attempt Resuscitation   In Pre-Arrest Conditions (Patient Is Breathing and Has a Pulse) Provide comfort measures. Relieve any mechanical airway obstruction. Avoid transfer unless required for comfort.   Consent: Discussion documented in EHR or advanced directives reviewed      11/05/23 1135           Code Status History     Date Active Date Inactive Code  Status Order ID Comments User Context   11/05/2023 0726 11/05/2023 1135 Limited: Do not attempt resuscitation (DNR) -DNR-LIMITED -Do Not Intubate/DNI  [id number]  Eldonna Elspeth PARAS, MD ED   10/20/2023 0820 10/23/2023 1828 Limited: Do not attempt resuscitation (DNR) -DNR-LIMITED -Do Not Intubate/DNI  [id number]  Eldonna Elspeth PARAS, MD ED   10/20/2023 0807 10/20/2023 0820 Full Code [id number]  Eldonna Elspeth PARAS, MD ED   04/03/2022 2115 04/05/2022 2024 DNR [id number]  Mansy, Madison LABOR, MD ED   11/12/2021 2358 11/14/2021 2308 DNR [id number]  Cox, Amy N, DO ED   11/12/2021 2317 11/12/2021 2358 Full Code [id number]  Cox, Amy N, DO ED   11/07/2021 1606 11/09/2021 1832 Full Code [id number]  Chotiner, Adine RAMAN, MD Inpatient   05/20/2015 1216 05/21/2015 0015 Full Code [id number]  Yisroel Sleight, MD ED      Advance Directive Documentation    Flowsheet Row Most Recent Value  Type of Advance Directive Out of facility DNR (pink MOST or yellow form)  Pre-existing out of facility DNR order (yellow form or pink MOST form) Pink MOST/Yellow Form most recent copy in chart - Physician notified to receive inpatient order  MOST Form in Place? --       Prognosis:  < 2 weeks   Care plan was discussed with team  Thank you for allowing the Palliative Medicine Team to assist in the care of this patient.     Camelia Lewis, NP  Please contact Palliative Medicine Team phone at [phone] for questions and concerns.

## 2023-11-09 NOTE — Discharge Summary (Signed)
 Physician Discharge Summary   Patient: Shawn [name] MRN: [id number] DOB: [birth year]  Admit date:     11/05/2023  Discharge date: 11/09/23  Discharge Physician: Lorane Poland   PCP: [facility]   Recommendations at discharge:    Hospice care  Discharge Diagnoses: Principal Problem:   Aspiration pneumonia (HCC) Active Problems:   S/P percutaneous endoscopic gastrostomy (PEG) tube placement (HCC)   ALS (amyotrophic lateral sclerosis) (HCC)   Asthma, chronic   Comfort measures only status   Palliative care by specialist  Resolved Problems:   * No resolved hospital problems. *  Hospital Course: Shawn [name] is 63 y.o. male with ALS, CAD, hyperlipidemia, history of prior CVA, T12 compression fracture, who presented with aspiration pneumonia.  History was provided from staff and patient's mother as patient is nonverbal at baseline secondary to ALS.  Patient has a PEG tube at baseline and was recently evaluated at [facility] due to issues with this tube.  Appears he was still tolerating some p.o. fluids until recently.  Per report patient was actively on hospice at home.  Palliative care was consulted and ultimately family made decision to transition to comfort measures only.  During his admission we assisted with pain and anxiety management. TOC was consult and secured safe discharge to a hospice facility at patient request. He is discharging directly there today.      Consultants: Palliative Care Procedures performed: n/a  Disposition: Hospice care Diet recommendation:  Discharge Diet Orders (From admission, onward)     Start     Ordered   11/09/23 0000  Diet general        11/09/23 1245           NPO   DISCHARGE MEDICATION: Allergies as of 11/09/2023       Reactions   Aspirin Other (See Comments), Itching, Shortness Of Breath, Swelling   Upset stomach  GI Upset   Compazine  [prochlorperazine ] Other (See Comments)   Added per pt. Pt had tachycardia  chest discomfort   Morphine  And Codeine Itching   Naproxen Other (See Comments)   Upset stomach  GI Upset   Penicillins Rash   Tramadol  Itching        Medication List     STOP taking these medications    cyclobenzaprine 5 MG tablet Commonly known as: FLEXERIL   fluticasone  furoate-vilanterol 100-25 MCG/ACT Aepb Commonly known as: BREO ELLIPTA    HYDROcodone -acetaminophen  5-325 MG tablet Commonly known as: NORCO/VICODIN   mirtazapine 15 MG tablet Commonly known as: REMERON   montelukast  10 MG tablet Commonly known as: SINGULAIR    promethazine 25 MG tablet Commonly known as: PHENERGAN   riluzole  50 MG tablet Commonly known as: RILUTEK        TAKE these medications    acetaminophen  160 MG/5ML solution Commonly known as: TYLENOL  Take 480 mg by mouth every 6 (six) hours as needed for mild pain or moderate pain.   albuterol  108 (90 Base) MCG/ACT inhaler Commonly known as: VENTOLIN  HFA Inhale 1-2 puffs into the lungs every 6 (six) hours as needed for wheezing or shortness of breath.   arformoterol 15 MCG/2ML Nebu Commonly known as: BROVANA Take 15 mcg by nebulization every 12 (twelve) hours.   Baclofen  5 MG Tabs Take 10 mg by mouth 3 (three) times daily.   budesonide 0.25 MG/2ML nebulizer solution Commonly known as: PULMICORT Take 0.25 mg by nebulization 2 (two) times daily.   fentaNYL  75 MCG/HR Commonly known as: DURAGESIC  Place 1 patch onto the  skin every 3 (three) days.   gabapentin  250 MG/5ML solution Commonly known as: NEURONTIN  Place 12 mLs into feeding tube 3 (three) times daily.        Discharge Exam: Filed Weights   11/06/23 2049  Weight: 51.8 kg   General exam: Appears calm and comfortable, NAD, thin Respiratory system: No work of breathing, symmetric chest wall expansion Cardiovascular system: S1 & S2 heard, RRR.  Gastrointestinal system: Abdomen is nondistended, soft and nontender.  Neuro: Alert, nonverbal, communicates with pen and  paper Extremities: Symmetric Skin: No rashes, lesions Psychiatry: Calm. Difficult to assess  Condition at discharge: stable  The results of significant diagnostics from this hospitalization (including imaging, microbiology, ancillary and laboratory) are listed below for reference.   Imaging Studies: DG Chest Port 1 View Result Date: 11/05/2023 CLINICAL DATA:  Chest pain EXAM: PORTABLE CHEST 1 VIEW COMPARISON:  10/30/2023 FINDINGS: Low volume chest with streaky density at the bases attributed atelectasis. Normal heart size and mediastinal contours. No visible effusion or pneumothorax. IMPRESSION: Low volume chest with atelectasis at the bases. Electronically Signed   By: Dorn Roulette M.D.   On: 11/05/2023 04:34   CT Renal Stone Study Result Date: 10/30/2023 CLINICAL DATA:  Abdominal pain. EXAM: CT ABDOMEN AND PELVIS WITHOUT CONTRAST TECHNIQUE: Multidetector CT imaging of the abdomen and pelvis was performed following the standard protocol without IV contrast. RADIATION DOSE REDUCTION: This exam was performed according to the departmental dose-optimization program which includes automated exposure control, adjustment of the mA and/or kV according to patient size and/or use of iterative reconstruction technique. COMPARISON:  October 20, 2023 FINDINGS: Lower chest: Mild atelectasis is seen within the posterior aspect of the bilateral lung bases. Hepatobiliary: No focal liver abnormality is seen. No gallstones, gallbladder wall thickening, or biliary dilatation. Pancreas: Unremarkable. No pancreatic ductal dilatation or surrounding inflammatory changes. Spleen: Normal in size without focal abnormality. Adrenals/Urinary Tract: Adrenal glands are unremarkable. Kidneys are normal in size, without obstructing renal calculi, focal lesion, or hydronephrosis. A 2 mm nonobstructing renal calculus is seen within the mid left kidney. A small amount of air is seen within the lumen of the urinary bladder.  Stomach/Bowel: A percutaneous gastrostomy tube is seen with its distal tip and insufflator bulb noted within the body of the stomach. Stomach is within normal limits. Appendix appears normal. No evidence of bowel wall thickening, distention, or inflammatory changes. Vascular/Lymphatic: Mild aortic atherosclerosis. No enlarged abdominal or pelvic lymph nodes. Reproductive: Prostate is unremarkable. Other: There is a stable 1.7 cm diameter fat containing right inguinal hernia. No abdominopelvic ascites. Musculoskeletal: A chronic compression fracture deformity of the T10 vertebral body is seen. There is prior vertebroplasty of the L2 vertebral body. No acute osseous abnormalities are identified. IMPRESSION: 1. Percutaneous gastrostomy tube positioning, as described above. 2. Small amount of air within the lumen of the urinary bladder, which may be secondary to recent instrumentation. 3. 2 mm nonobstructing left renal calculus. 4. Chronic compression fracture deformity of the T10 vertebral body. 5. Prior vertebroplasty of the L2 vertebral body. 6. Aortic atherosclerosis. Aortic Atherosclerosis (ICD10-I70.0). Electronically Signed   By: Suzen Dials M.D.   On: 10/30/2023 21:09   DG Chest 1 View Result Date: 10/30/2023 CLINICAL DATA:  Shortness of breath. EXAM: CHEST  1 VIEW COMPARISON:  Radiographs 09/13/2023 and CT 10/20/2023 FINDINGS: Stable cardiomediastinal silhouette. No focal consolidation, pleural effusion, or pneumothorax. No displaced rib fractures. IMPRESSION: No active disease. Electronically Signed   By: Norman Gatlin M.D.   On: 10/30/2023  21:00   ECHOCARDIOGRAM COMPLETE Result Date: 10/21/2023    ECHOCARDIOGRAM REPORT   Patient Name:   ALOIS COLGAN Date of Exam: 10/21/2023 Medical Rec #:  [id number]        Height:       70.0 in Accession #:    [id number]       Weight:       135.8 lb Date of Birth:  [birth year]       BSA:          1.771 m Patient Age:    62 years         BP:           118/74  mmHg Patient Gender: M                HR:           76 bpm. Exam Location:  [facility] Procedure: 2D Echo Indications:     Syncope R55  History:         Patient has no prior history of Echocardiogram examinations.  Sonographer:     Thedora Louder RDCS, FASE Referring Phys:  [id number] ELSP[address] PARAS [place]ON Diagnosing Phys: Wilbert Bihari MD  Sonographer Comments: No subcostal window. Image acquisition challenging due to respiratory motion. IMPRESSIONS  1. Left ventricular ejection fraction, by estimation, is 60 to 65%. The left ventricle has normal function. The left ventricle has no regional wall motion abnormalities. Left ventricular diastolic parameters were normal.  2. Right ventricular systolic function is normal. The right ventricular size is normal.  3. The mitral valve is normal in structure. No evidence of mitral valve regurgitation. No evidence of mitral stenosis.  4. The aortic valve is tricuspid. Aortic valve regurgitation is not visualized. Aortic valve sclerosis/calcification is present, without any evidence of aortic stenosis. Aortic valve Vmax measures 1.34 m/s. FINDINGS  Left Ventricle: Left ventricular ejection fraction, by estimation, is 60 to 65%. The left ventricle has normal function. The left ventricle has no regional wall motion abnormalities. The left ventricular internal cavity size was normal in size. There is  no left ventricular hypertrophy. Left ventricular diastolic parameters were normal. Normal left ventricular filling pressure. Right Ventricle: The right ventricular size is normal. No increase in right ventricular wall thickness. Right ventricular systolic function is normal. Left Atrium: Left atrial size was normal in size. Right Atrium: Right atrial size was normal in size. Pericardium: There is no evidence of pericardial effusion. Mitral Valve: The mitral valve is normal in structure. No evidence of mitral valve regurgitation. No evidence of mitral valve stenosis. Tricuspid Valve: The tricuspid  valve is normal in structure. Tricuspid valve regurgitation is trivial. No evidence of tricuspid stenosis. Aortic Valve: The aortic valve is tricuspid. Aortic valve regurgitation is not visualized. Aortic valve sclerosis/calcification is present, without any evidence of aortic stenosis. Aortic valve peak gradient measures 7.2 mmHg. Pulmonic Valve: The pulmonic valve was normal in structure. Pulmonic valve regurgitation is not visualized. No evidence of pulmonic stenosis. Aorta: The aortic root is normal in size and structure. Venous: The inferior vena cava was not well visualized. IAS/Shunts: No atrial level shunt detected by color flow Doppler.  LEFT VENTRICLE PLAX 2D LVIDd:         3.90 cm   Diastology LVIDs:         2.30 cm   LV e' medial:    12.90 cm/s LV PW:         0.90 cm   LV E/e' medial:  5.1  LV IVS:        1.00 cm   LV e' lateral:   14.10 cm/s LVOT diam:     1.80 cm   LV E/e' lateral: 4.7 LV SV:         44 LV SV Index:   25 LVOT Area:     2.54 cm  RIGHT VENTRICLE RV Basal diam:  2.80 cm RV S prime:     15.20 cm/s TAPSE (M-mode): 1.6 cm LEFT ATRIUM           Index        RIGHT ATRIUM          Index LA diam:      2.90 cm 1.64 cm/m   RA Area:     8.54 cm LA Vol (A2C): 15.6 ml 8.81 ml/m   RA Volume:   14.40 ml 8.13 ml/m LA Vol (A4C): 26.7 ml 15.08 ml/m  AORTIC VALVE                 PULMONIC VALVE AV Area (Vmax): 1.76 cm     PV Vmax:        0.81 m/s AV Vmax:        134.00 cm/s  PV Peak grad:   2.7 mmHg AV Peak Grad:   7.2 mmHg     RVOT Peak grad: 2 mmHg LVOT Vmax:      92.70 cm/s LVOT Vmean:     58.900 cm/s LVOT VTI:       0.171 m  AORTA Ao Root diam: 2.70 cm Ao Asc diam:  2.80 cm MITRAL VALVE               TRICUSPID VALVE MV Area (PHT): 2.84 cm    TR Peak grad:   15.1 mmHg MV Decel Time: 267 msec    TR Vmax:        194.00 cm/s MV E velocity: 65.60 cm/s MV A velocity: 66.80 cm/s  SHUNTS MV E/A ratio:  0.98        Systemic VTI:  0.17 m                            Systemic Diam: 1.80 cm Wilbert Bihari MD  Electronically signed by Wilbert Bihari MD Signature Date/Time: 10/21/2023/4:22:54 PM    Final    MR BRAIN WO CONTRAST Result Date: 10/20/2023 CLINICAL DATA:  Syncope/presyncope with cerebrovascular cause suspected. ALS ; fall in kitchen. EXAM: MRI HEAD WITHOUT CONTRAST TECHNIQUE: Multiplanar, multiecho pulse sequences of the brain and surrounding structures were obtained without intravenous contrast. COMPARISON:  Head CT from earlier today FINDINGS: Brain: No acute infarction, hemorrhage, hydrocephalus, extra-axial collection or mass lesion. Vascular: Normal flow voids. Skull and upper cervical spine: Normal marrow signal. Sinuses/Orbits: Negative. IMPRESSION: No acute finding.  No occult intracranial injury. Electronically Signed   By: Dorn Roulette M.D.   On: 10/20/2023 09:39   MR Cervical Spine Wo Contrast Result Date: 10/20/2023 CLINICAL DATA:  Back trauma with abnormal neuro exam. New onset weakness in the right leg. Fall tonight. EXAM: MRI CERVICAL AND THORACIC SPINE WITHOUT CONTRAST TECHNIQUE: Multiplanar and multiecho pulse sequences of the cervical spine, to include the craniocervical junction and cervicothoracic junction, and the thoracic spine, were obtained without intravenous contrast. COMPARISON:  Thoracic and lumbar CT from earlier today FINDINGS: MRI CERVICAL SPINE FINDINGS Alignment: Reversal of cervical lordosis, degenerative. No traumatic malalignment. Vertebrae: No fracture, evidence of discitis, or bone lesion. Cord:  Normal signal and morphology. Posterior Fossa, vertebral arteries, paraspinal tissues: Negative for swelling or masslike appearance Disc levels: C2-3: Spurring on the right with facet ankylosis and fatty marrow conversion. C3-4: Mild facet spurring and disc bulging. C4-5: Disc height loss and bulging with small central protrusion. Negative facets. C5-6: Intervertebral collapse and ankylosis. Posterior ridging contacts the cord but no compression, posterior CSF space is well  maintained. C6-7: Unremarkable. C7-T1:Unremarkable. MRI THORACIC SPINE FINDINGS Alignment:  Physiologic. Vertebrae: Marrow edematous signal in the T10 body with horizontal hypointense fracture plane following the mildly depressed superior endplate. There have been remote and healed compression fractures of T9 and L1. T3-4 non segmentation. Cord:  Normal signal and morphology. Paraspinal and other soft tissues: Negative. Disc levels: Disc desiccation diffusely. No herniation, facet spurring, or neural impingement. IMPRESSION: Cervical spine: 1. No acute finding. 2. 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 No significant spinal stenosis. Thoracic spine: 1. Acute T10 compression fracture with mild height loss. 2. Remote and healed T9 and L1 compression fractures. Electronically Signed   By: Dorn Roulette M.D.   On: 10/20/2023 05:16   MR THORACIC SPINE WO CONTRAST Result Date: 10/20/2023 CLINICAL DATA:  Back trauma with abnormal neuro exam. New onset weakness in the right leg. Fall tonight. EXAM: MRI CERVICAL AND THORACIC SPINE WITHOUT CONTRAST TECHNIQUE: Multiplanar and multiecho pulse sequences of the cervical spine, to include the craniocervical junction and cervicothoracic junction, and the thoracic spine, were obtained without intravenous contrast. COMPARISON:  Thoracic and lumbar CT from earlier today FINDINGS: MRI CERVICAL SPINE FINDINGS Alignment: Reversal of cervical lordosis, degenerative. No traumatic malalignment. Vertebrae: No fracture, evidence of discitis, or bone lesion. Cord: Normal signal and morphology. Posterior Fossa, vertebral arteries, paraspinal tissues: Negative for swelling or masslike appearance Disc levels: C2-3: Spurring on the right with facet ankylosis and fatty marrow conversion. C3-4: Mild facet spurring and disc bulging. C4-5: Disc height loss and bulging with small central protrusion. Negative facets. C5-6: Intervertebral collapse and ankylosis. Posterior  ridging contacts the cord but no compression, posterior CSF space is well maintained. C6-7: Unremarkable. C7-T1:Unremarkable. MRI THORACIC SPINE FINDINGS Alignment:  Physiologic. Vertebrae: Marrow edematous signal in the T10 body with horizontal hypointense fracture plane following the mildly depressed superior endplate. There have been remote and healed compression fractures of T9 and L1. T3-4 non segmentation. Cord:  Normal signal and morphology. Paraspinal and other soft tissues: Negative. Disc levels: Disc desiccation diffusely. No herniation, facet spurring, or neural impingement. IMPRESSION: Cervical spine: 1. No acute finding. 2. 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 No significant spinal stenosis. Thoracic spine: 1. Acute T10 compression fracture with mild height loss. 2. Remote and healed T9 and L1 compression fractures. Electronically Signed   By: Dorn Roulette M.D.   On: 10/20/2023 05:16   DG Femur Min 2 Views Right Result Date: 10/20/2023 CLINICAL DATA:  Fall, right leg pain EXAM: RIGHT FEMUR 2 VIEWS COMPARISON:  None Available. FINDINGS: There is no evidence of fracture or other focal bone lesions. Soft tissues are unremarkable. IMPRESSION: Negative. Electronically Signed   By: Dorethia Molt M.D.   On: 10/20/2023 04:27   CT Head Wo Contrast Result Date: 10/20/2023 CLINICAL DATA:  Blunt chest trauma from fall. Right head and neck pain. Loss consciousness during the fall. History of ALS. Nonverbal at baseline. Weakness in legs post fall. EXAM: CT HEAD WITHOUT CONTRAST CT CERVICAL SPINE WITHOUT CONTRAST CT CHEST, ABDOMEN AND PELVIS WITHOUT CONTRAST CT Thoracic and Lumbar spine  Contrast TECHNIQUE: Multiplanar CT images of the thoracic and lumbar spine were reconstructed from contemporary CT of the Chest, Abdomen, and Pelvis. Contiguous axial images were obtained from the base of the skull through the vertex without intravenous contrast. Multidetector CT imaging of the  cervical spine was performed without intravenous contrast. Multiplanar CT image reconstructions were also generated. Multidetector CT imaging of the chest, abdomen and pelvis was performed following the standard protocol without IV contrast. RADIATION DOSE REDUCTION: This exam was performed according to the departmental dose-optimization program which includes automated exposure control, adjustment of the mA and/or kV according to patient size and/or use of iterative reconstruction technique. COMPARISON:  CT 09/13/2023 and 02/28/2023 FINDINGS: CT HEAD FINDINGS Brain: No intracranial hemorrhage, mass effect, or evidence of acute infarct. No hydrocephalus. No extra-axial fluid collection. Age-commensurate cerebral atrophy and chronic small vessel ischemic disease. Vascular: No hyperdense vessel. Intracranial arterial calcification. Skull: No fracture or focal lesion. Sinuses/Orbits: No acute finding. Other: None. CT CERVICAL FINDINGS Alignment: No evidence of traumatic listhesis. Skull base and vertebrae: No acute fracture. Soft tissues and spinal canal: No prevertebral fluid or swelling. No visible canal hematoma. Disc levels: Unchanged spondylosis and disc space height loss greatest at C5-C6 where it is advanced. No severe spinal canal narrowing. Other: None. CT CHEST FINDINGS Cardiovascular: Normal heart size. No pericardial effusion. Mild aortic atherosclerotic calcification. Mediastinum/Nodes: Trachea and esophagus are unremarkable. No mediastinal adenopathy. Lungs/Pleura: Biapical pleural-parenchymal scarring. Bibasilar atelectasis/scarring. No pleural effusion or pneumothorax. Musculoskeletal: No acute fracture. CT ABDOMEN AND PELVIS FINDINGS Hepatobiliary: No acute abnormality. Pancreas: Unremarkable. Spleen: Unremarkable. Adrenals/Urinary Tract: Normal adrenal glands. Nonobstructing left nephrolithiasis. No urinary calculi or hydronephrosis. Unremarkable bladder. Stomach/Bowel: Normal caliber large and small  bowel. No bowel wall thickening. Percutaneous gastrostomy tube in the stomach. Unremarkable appendix. Vascular/Lymphatic: No significant vascular findings are present. No enlarged abdominal or pelvic lymph nodes. Reproductive: Unremarkable. Other: No free intraperitoneal fluid or air. Musculoskeletal: No acute fracture in the pelvis. CT THORACIC SPINE FINDINGS Alignment: No evidence of traumatic listhesis. Vertebrae: Age indeterminate superior endplate compression fracture of T10, new since 02/28/2023. There is approximately 10-20% vertebral body height loss. No retropulsion. No additional fractures. Paraspinal and other soft tissues: See above. Disc levels: Mild multilevel spondylosis. No severe spinal canal or neural foraminal narrowing. CT LUMBAR SPINE FINDINGS Segmentation: 5 lumbar type vertebrae. Alignment: No evidence of traumatic listhesis. Vertebrae: Chronic superior endplate compression fracture of L1. Vertebroplasty of L2. No acute fracture. Paraspinal and other soft tissues: See above. Disc levels: Intervertebral disc space height is maintained. No severe spinal canal or neural foraminal narrowing. IMPRESSION: 1. No acute intracranial abnormality. 2. No acute fracture in the cervical spine. 3. No acute abnormality in the chest, abdomen, or pelvis. 4. Age indeterminate superior endplate compression fracture of T10. 5. No acute fracture in the lumbar spine. Aortic Atherosclerosis (ICD10-I70.0). Electronically Signed   By: Norman Gatlin M.D.   On: 10/20/2023 03:34   CT Cervical Spine Wo Contrast Result Date: 10/20/2023 CLINICAL DATA:  Blunt chest trauma from fall. Right head and neck pain. Loss consciousness during the fall. History of ALS. Nonverbal at baseline. Weakness in legs post fall. EXAM: CT HEAD WITHOUT CONTRAST CT CERVICAL SPINE WITHOUT CONTRAST CT CHEST, ABDOMEN AND PELVIS WITHOUT CONTRAST CT Thoracic and Lumbar spine  Contrast TECHNIQUE: Multiplanar CT images of the thoracic and lumbar  spine were reconstructed from contemporary CT of the Chest, Abdomen, and Pelvis. Contiguous axial images were obtained from the base of the skull through the vertex without  intravenous contrast. Multidetector CT imaging of the cervical spine was performed without intravenous contrast. Multiplanar CT image reconstructions were also generated. Multidetector CT imaging of the chest, abdomen and pelvis was performed following the standard protocol without IV contrast. RADIATION DOSE REDUCTION: This exam was performed according to the departmental dose-optimization program which includes automated exposure control, adjustment of the mA and/or kV according to patient size and/or use of iterative reconstruction technique. COMPARISON:  CT 09/13/2023 and 02/28/2023 FINDINGS: CT HEAD FINDINGS Brain: No intracranial hemorrhage, mass effect, or evidence of acute infarct. No hydrocephalus. No extra-axial fluid collection. Age-commensurate cerebral atrophy and chronic small vessel ischemic disease. Vascular: No hyperdense vessel. Intracranial arterial calcification. Skull: No fracture or focal lesion. Sinuses/Orbits: No acute finding. Other: None. CT CERVICAL FINDINGS Alignment: No evidence of traumatic listhesis. Skull base and vertebrae: No acute fracture. Soft tissues and spinal canal: No prevertebral fluid or swelling. No visible canal hematoma. Disc levels: Unchanged spondylosis and disc space height loss greatest at C5-C6 where it is advanced. No severe spinal canal narrowing. Other: None. CT CHEST FINDINGS Cardiovascular: Normal heart size. No pericardial effusion. Mild aortic atherosclerotic calcification. Mediastinum/Nodes: Trachea and esophagus are unremarkable. No mediastinal adenopathy. Lungs/Pleura: Biapical pleural-parenchymal scarring. Bibasilar atelectasis/scarring. No pleural effusion or pneumothorax. Musculoskeletal: No acute fracture. CT ABDOMEN AND PELVIS FINDINGS Hepatobiliary: No acute abnormality. Pancreas:  Unremarkable. Spleen: Unremarkable. Adrenals/Urinary Tract: Normal adrenal glands. Nonobstructing left nephrolithiasis. No urinary calculi or hydronephrosis. Unremarkable bladder. Stomach/Bowel: Normal caliber large and small bowel. No bowel wall thickening. Percutaneous gastrostomy tube in the stomach. Unremarkable appendix. Vascular/Lymphatic: No significant vascular findings are present. No enlarged abdominal or pelvic lymph nodes. Reproductive: Unremarkable. Other: No free intraperitoneal fluid or air. Musculoskeletal: No acute fracture in the pelvis. CT THORACIC SPINE FINDINGS Alignment: No evidence of traumatic listhesis. Vertebrae: Age indeterminate superior endplate compression fracture of T10, new since 02/28/2023. There is approximately 10-20% vertebral body height loss. No retropulsion. No additional fractures. Paraspinal and other soft tissues: See above. Disc levels: Mild multilevel spondylosis. No severe spinal canal or neural foraminal narrowing. CT LUMBAR SPINE FINDINGS Segmentation: 5 lumbar type vertebrae. Alignment: No evidence of traumatic listhesis. Vertebrae: Chronic superior endplate compression fracture of L1. Vertebroplasty of L2. No acute fracture. Paraspinal and other soft tissues: See above. Disc levels: Intervertebral disc space height is maintained. No severe spinal canal or neural foraminal narrowing. IMPRESSION: 1. No acute intracranial abnormality. 2. No acute fracture in the cervical spine. 3. No acute abnormality in the chest, abdomen, or pelvis. 4. Age indeterminate superior endplate compression fracture of T10. 5. No acute fracture in the lumbar spine. Aortic Atherosclerosis (ICD10-I70.0). Electronically Signed   By: Norman Gatlin M.D.   On: 10/20/2023 03:34   CT T-SPINE NO CHARGE Result Date: 10/20/2023 CLINICAL DATA:  Blunt chest trauma from fall. Right head and neck pain. Loss consciousness during the fall. History of ALS. Nonverbal at baseline. Weakness in legs post  fall. EXAM: CT HEAD WITHOUT CONTRAST CT CERVICAL SPINE WITHOUT CONTRAST CT CHEST, ABDOMEN AND PELVIS WITHOUT CONTRAST CT Thoracic and Lumbar spine  Contrast TECHNIQUE: Multiplanar CT images of the thoracic and lumbar spine were reconstructed from contemporary CT of the Chest, Abdomen, and Pelvis. Contiguous axial images were obtained from the base of the skull through the vertex without intravenous contrast. Multidetector CT imaging of the cervical spine was performed without intravenous contrast. Multiplanar CT image reconstructions were also generated. Multidetector CT imaging of the chest, abdomen and pelvis was performed following the standard protocol without IV contrast.  RADIATION DOSE REDUCTION: This exam was performed according to the departmental dose-optimization program which includes automated exposure control, adjustment of the mA and/or kV according to patient size and/or use of iterative reconstruction technique. COMPARISON:  CT 09/13/2023 and 02/28/2023 FINDINGS: CT HEAD FINDINGS Brain: No intracranial hemorrhage, mass effect, or evidence of acute infarct. No hydrocephalus. No extra-axial fluid collection. Age-commensurate cerebral atrophy and chronic small vessel ischemic disease. Vascular: No hyperdense vessel. Intracranial arterial calcification. Skull: No fracture or focal lesion. Sinuses/Orbits: No acute finding. Other: None. CT CERVICAL FINDINGS Alignment: No evidence of traumatic listhesis. Skull base and vertebrae: No acute fracture. Soft tissues and spinal canal: No prevertebral fluid or swelling. No visible canal hematoma. Disc levels: Unchanged spondylosis and disc space height loss greatest at C5-C6 where it is advanced. No severe spinal canal narrowing. Other: None. CT CHEST FINDINGS Cardiovascular: Normal heart size. No pericardial effusion. Mild aortic atherosclerotic calcification. Mediastinum/Nodes: Trachea and esophagus are unremarkable. No mediastinal adenopathy. Lungs/Pleura:  Biapical pleural-parenchymal scarring. Bibasilar atelectasis/scarring. No pleural effusion or pneumothorax. Musculoskeletal: No acute fracture. CT ABDOMEN AND PELVIS FINDINGS Hepatobiliary: No acute abnormality. Pancreas: Unremarkable. Spleen: Unremarkable. Adrenals/Urinary Tract: Normal adrenal glands. Nonobstructing left nephrolithiasis. No urinary calculi or hydronephrosis. Unremarkable bladder. Stomach/Bowel: Normal caliber large and small bowel. No bowel wall thickening. Percutaneous gastrostomy tube in the stomach. Unremarkable appendix. Vascular/Lymphatic: No significant vascular findings are present. No enlarged abdominal or pelvic lymph nodes. Reproductive: Unremarkable. Other: No free intraperitoneal fluid or air. Musculoskeletal: No acute fracture in the pelvis. CT THORACIC SPINE FINDINGS Alignment: No evidence of traumatic listhesis. Vertebrae: Age indeterminate superior endplate compression fracture of T10, new since 02/28/2023. There is approximately 10-20% vertebral body height loss. No retropulsion. No additional fractures. Paraspinal and other soft tissues: See above. Disc levels: Mild multilevel spondylosis. No severe spinal canal or neural foraminal narrowing. CT LUMBAR SPINE FINDINGS Segmentation: 5 lumbar type vertebrae. Alignment: No evidence of traumatic listhesis. Vertebrae: Chronic superior endplate compression fracture of L1. Vertebroplasty of L2. No acute fracture. Paraspinal and other soft tissues: See above. Disc levels: Intervertebral disc space height is maintained. No severe spinal canal or neural foraminal narrowing. IMPRESSION: 1. No acute intracranial abnormality. 2. No acute fracture in the cervical spine. 3. No acute abnormality in the chest, abdomen, or pelvis. 4. Age indeterminate superior endplate compression fracture of T10. 5. No acute fracture in the lumbar spine. Aortic Atherosclerosis (ICD10-I70.0). Electronically Signed   By: Norman Gatlin M.D.   On: 10/20/2023 03:34    CT L-SPINE NO CHARGE Result Date: 10/20/2023 CLINICAL DATA:  Blunt chest trauma from fall. Right head and neck pain. Loss consciousness during the fall. History of ALS. Nonverbal at baseline. Weakness in legs post fall. EXAM: CT HEAD WITHOUT CONTRAST CT CERVICAL SPINE WITHOUT CONTRAST CT CHEST, ABDOMEN AND PELVIS WITHOUT CONTRAST CT Thoracic and Lumbar spine  Contrast TECHNIQUE: Multiplanar CT images of the thoracic and lumbar spine were reconstructed from contemporary CT of the Chest, Abdomen, and Pelvis. Contiguous axial images were obtained from the base of the skull through the vertex without intravenous contrast. Multidetector CT imaging of the cervical spine was performed without intravenous contrast. Multiplanar CT image reconstructions were also generated. Multidetector CT imaging of the chest, abdomen and pelvis was performed following the standard protocol without IV contrast. RADIATION DOSE REDUCTION: This exam was performed according to the departmental dose-optimization program which includes automated exposure control, adjustment of the mA and/or kV according to patient size and/or use of iterative reconstruction technique. COMPARISON:  CT 09/13/2023 and  02/28/2023 FINDINGS: CT HEAD FINDINGS Brain: No intracranial hemorrhage, mass effect, or evidence of acute infarct. No hydrocephalus. No extra-axial fluid collection. Age-commensurate cerebral atrophy and chronic small vessel ischemic disease. Vascular: No hyperdense vessel. Intracranial arterial calcification. Skull: No fracture or focal lesion. Sinuses/Orbits: No acute finding. Other: None. CT CERVICAL FINDINGS Alignment: No evidence of traumatic listhesis. Skull base and vertebrae: No acute fracture. Soft tissues and spinal canal: No prevertebral fluid or swelling. No visible canal hematoma. Disc levels: Unchanged spondylosis and disc space height loss greatest at C5-C6 where it is advanced. No severe spinal canal narrowing. Other: None. CT  CHEST FINDINGS Cardiovascular: Normal heart size. No pericardial effusion. Mild aortic atherosclerotic calcification. Mediastinum/Nodes: Trachea and esophagus are unremarkable. No mediastinal adenopathy. Lungs/Pleura: Biapical pleural-parenchymal scarring. Bibasilar atelectasis/scarring. No pleural effusion or pneumothorax. Musculoskeletal: No acute fracture. CT ABDOMEN AND PELVIS FINDINGS Hepatobiliary: No acute abnormality. Pancreas: Unremarkable. Spleen: Unremarkable. Adrenals/Urinary Tract: Normal adrenal glands. Nonobstructing left nephrolithiasis. No urinary calculi or hydronephrosis. Unremarkable bladder. Stomach/Bowel: Normal caliber large and small bowel. No bowel wall thickening. Percutaneous gastrostomy tube in the stomach. Unremarkable appendix. Vascular/Lymphatic: No significant vascular findings are present. No enlarged abdominal or pelvic lymph nodes. Reproductive: Unremarkable. Other: No free intraperitoneal fluid or air. Musculoskeletal: No acute fracture in the pelvis. CT THORACIC SPINE FINDINGS Alignment: No evidence of traumatic listhesis. Vertebrae: Age indeterminate superior endplate compression fracture of T10, new since 02/28/2023. There is approximately 10-20% vertebral body height loss. No retropulsion. No additional fractures. Paraspinal and other soft tissues: See above. Disc levels: Mild multilevel spondylosis. No severe spinal canal or neural foraminal narrowing. CT LUMBAR SPINE FINDINGS Segmentation: 5 lumbar type vertebrae. Alignment: No evidence of traumatic listhesis. Vertebrae: Chronic superior endplate compression fracture of L1. Vertebroplasty of L2. No acute fracture. Paraspinal and other soft tissues: See above. Disc levels: Intervertebral disc space height is maintained. No severe spinal canal or neural foraminal narrowing. IMPRESSION: 1. No acute intracranial abnormality. 2. No acute fracture in the cervical spine. 3. No acute abnormality in the chest, abdomen, or pelvis. 4.  Age indeterminate superior endplate compression fracture of T10. 5. No acute fracture in the lumbar spine. Aortic Atherosclerosis (ICD10-I70.0). Electronically Signed   By: Norman Gatlin M.D.   On: 10/20/2023 03:34   CT CHEST ABDOMEN PELVIS WO CONTRAST Result Date: 10/20/2023 CLINICAL DATA:  Blunt chest trauma from fall. Right head and neck pain. Loss consciousness during the fall. History of ALS. Nonverbal at baseline. Weakness in legs post fall. EXAM: CT HEAD WITHOUT CONTRAST CT CERVICAL SPINE WITHOUT CONTRAST CT CHEST, ABDOMEN AND PELVIS WITHOUT CONTRAST CT Thoracic and Lumbar spine  Contrast TECHNIQUE: Multiplanar CT images of the thoracic and lumbar spine were reconstructed from contemporary CT of the Chest, Abdomen, and Pelvis. Contiguous axial images were obtained from the base of the skull through the vertex without intravenous contrast. Multidetector CT imaging of the cervical spine was performed without intravenous contrast. Multiplanar CT image reconstructions were also generated. Multidetector CT imaging of the chest, abdomen and pelvis was performed following the standard protocol without IV contrast. RADIATION DOSE REDUCTION: This exam was performed according to the departmental dose-optimization program which includes automated exposure control, adjustment of the mA and/or kV according to patient size and/or use of iterative reconstruction technique. COMPARISON:  CT 09/13/2023 and 02/28/2023 FINDINGS: CT HEAD FINDINGS Brain: No intracranial hemorrhage, mass effect, or evidence of acute infarct. No hydrocephalus. No extra-axial fluid collection. Age-commensurate cerebral atrophy and chronic small vessel ischemic disease. Vascular: No hyperdense vessel. Intracranial arterial  calcification. Skull: No fracture or focal lesion. Sinuses/Orbits: No acute finding. Other: None. CT CERVICAL FINDINGS Alignment: No evidence of traumatic listhesis. Skull base and vertebrae: No acute fracture. Soft tissues and  spinal canal: No prevertebral fluid or swelling. No visible canal hematoma. Disc levels: Unchanged spondylosis and disc space height loss greatest at C5-C6 where it is advanced. No severe spinal canal narrowing. Other: None. CT CHEST FINDINGS Cardiovascular: Normal heart size. No pericardial effusion. Mild aortic atherosclerotic calcification. Mediastinum/Nodes: Trachea and esophagus are unremarkable. No mediastinal adenopathy. Lungs/Pleura: Biapical pleural-parenchymal scarring. Bibasilar atelectasis/scarring. No pleural effusion or pneumothorax. Musculoskeletal: No acute fracture. CT ABDOMEN AND PELVIS FINDINGS Hepatobiliary: No acute abnormality. Pancreas: Unremarkable. Spleen: Unremarkable. Adrenals/Urinary Tract: Normal adrenal glands. Nonobstructing left nephrolithiasis. No urinary calculi or hydronephrosis. Unremarkable bladder. Stomach/Bowel: Normal caliber large and small bowel. No bowel wall thickening. Percutaneous gastrostomy tube in the stomach. Unremarkable appendix. Vascular/Lymphatic: No significant vascular findings are present. No enlarged abdominal or pelvic lymph nodes. Reproductive: Unremarkable. Other: No free intraperitoneal fluid or air. Musculoskeletal: No acute fracture in the pelvis. CT THORACIC SPINE FINDINGS Alignment: No evidence of traumatic listhesis. Vertebrae: Age indeterminate superior endplate compression fracture of T10, new since 02/28/2023. There is approximately 10-20% vertebral body height loss. No retropulsion. No additional fractures. Paraspinal and other soft tissues: See above. Disc levels: Mild multilevel spondylosis. No severe spinal canal or neural foraminal narrowing. CT LUMBAR SPINE FINDINGS Segmentation: 5 lumbar type vertebrae. Alignment: No evidence of traumatic listhesis. Vertebrae: Chronic superior endplate compression fracture of L1. Vertebroplasty of L2. No acute fracture. Paraspinal and other soft tissues: See above. Disc levels: Intervertebral disc space  height is maintained. No severe spinal canal or neural foraminal narrowing. IMPRESSION: 1. No acute intracranial abnormality. 2. No acute fracture in the cervical spine. 3. No acute abnormality in the chest, abdomen, or pelvis. 4. Age indeterminate superior endplate compression fracture of T10. 5. No acute fracture in the lumbar spine. Aortic Atherosclerosis (ICD10-I70.0). Electronically Signed   By: Norman Gatlin M.D.   On: 10/20/2023 03:34    Microbiology: Results for orders placed or performed during the hospital encounter of 11/05/23  Blood culture (routine x 2)     Status: None (Preliminary result)   Collection Time: 11/05/23  4:02 AM   Specimen: BLOOD  Result Value Ref Range Status   Specimen Description BLOOD BLOOD RIGHT HAND  Final   Special Requests   Final    BOTTLES DRAWN AEROBIC AND ANAEROBIC Blood Culture adequate volume   Culture   Final    NO GROWTH 4 DAYS Performed at [facility], [address], [place], [place] [address]    Report Status PENDING  Incomplete  Culture, blood (Routine X 2) w Reflex to ID Panel     Status: None (Preliminary result)   Collection Time: 11/05/23  4:02 AM   Specimen: BLOOD  Result Value Ref Range Status   Specimen Description BLOOD BLOOD RIGHT WRIST  Final   Special Requests   Final    BOTTLES DRAWN AEROBIC AND ANAEROBIC Blood Culture adequate volume   Culture   Final    NO GROWTH 4 DAYS Performed at [facility], [address] [place], [place] [address]    Report Status PENDING  Incomplete  Group A Strep by PCR     Status: None   Collection Time: 11/05/23  4:03 AM   Specimen: Throat; Sterile Swab  Result Value Ref Range Status   Group A Strep by PCR NOT DETECTED NOT DETECTED  Final    Comment: Performed at [facility], [address] [place], [place] [address]  Resp panel by RT-PCR (RSV, Flu A&B, Covid) Throat     Status: None   Collection Time: 11/05/23  4:03 AM   Specimen: Throat; Nasal Swab   Result Value Ref Range Status   SARS Coronavirus 2 by RT PCR NEGATIVE NEGATIVE Final    Comment: (NOTE) SARS-CoV-2 target nucleic acids are NOT DETECTED.  The SARS-CoV-2 RNA is generally detectable in upper respiratory specimens during the acute phase of infection. The lowest concentration of SARS-CoV-2 viral copies this assay can detect is 138 copies/mL. A negative result does not preclude SARS-Cov-2 infection and should not be used as the sole basis for treatment or other patient management decisions. A negative result may occur with  improper specimen collection/handling, submission of specimen other than nasopharyngeal swab, presence of viral mutation(s) within the areas targeted by this assay, and inadequate number of viral copies(<138 copies/mL). A negative result must be combined with clinical observations, patient history, and epidemiological information. The expected result is Negative.  Fact Sheet for Patients:  bloggercourse.com  Fact Sheet for Healthcare Providers:  seriousbroker.it  This test is no t yet approved or cleared by the [place]  FDA and  has been authorized for detection and/or diagnosis of SARS-CoV-2 by FDA under an Emergency Use Authorization (EUA). This EUA will remain  in effect (meaning this test can be used) for the duration of the COVID-19 declaration under Section 564(b)(1) of the Act, 21 U.S.C.section 360bbb-3(b)(1), unless the authorization is terminated  or revoked sooner.       Influenza A by PCR NEGATIVE NEGATIVE Final   Influenza B by PCR NEGATIVE NEGATIVE Final    Comment: (NOTE) The Xpert Xpress SARS-CoV-2/FLU/RSV plus assay is intended as an aid in the diagnosis of influenza from Nasopharyngeal swab specimens and should not be used as a sole basis for treatment. Nasal washings and aspirates are unacceptable for Xpert Xpress SARS-CoV-2/FLU/RSV testing.  Fact Sheet for  Patients: bloggercourse.com  Fact Sheet for Healthcare Providers: seriousbroker.it  This test is not yet approved or cleared by the [place]  FDA and has been authorized for detection and/or diagnosis of SARS-CoV-2 by FDA under an Emergency Use Authorization (EUA). This EUA will remain in effect (meaning this test can be used) for the duration of the COVID-19 declaration under Section 564(b)(1) of the Act, 21 U.S.C. section 360bbb-3(b)(1), unless the authorization is terminated or revoked.     Resp Syncytial Virus by PCR NEGATIVE NEGATIVE Final    Comment: (NOTE) Fact Sheet for Patients: bloggercourse.com  Fact Sheet for Healthcare Providers: seriousbroker.it  This test is not yet approved or cleared by the [place]  FDA and has been authorized for detection and/or diagnosis of SARS-CoV-2 by FDA under an Emergency Use Authorization (EUA). This EUA will remain in effect (meaning this test can be used) for the duration of the COVID-19 declaration under Section 564(b)(1) of the Act, 21 U.S.C. section 360bbb-3(b)(1), unless the authorization is terminated or revoked.  Performed at [facility] [address] [place], [place] [address]     Labs: CBC: Recent Labs  Lab 11/05/23 0403  WBC 5.5  HGB 13.6  HCT 40.5  MCV 99.5  PLT 473*   Basic Metabolic Panel: Recent Labs  Lab 11/05/23 0403  NA 144  K 3.5  CL 108  CO2 25  GLUCOSE 107*  BUN 24*  CREATININE 0.56*  CALCIUM  9.6   Liver Function  Tests: No results for input(s): AST, ALT, ALKPHOS, BILITOT, PROT, ALBUMIN in the last 168 hours. CBG: No results for input(s): GLUCAP in the last 168 hours.  Discharge time spent: greater than 30 minutes.  Signed: Mana Haberl, DO [facility] 11/09/2023

## 2023-11-09 NOTE — Hospital Course (Signed)
 Shawn [name] is 63 y.o. male with ALS, CAD, hyperlipidemia, history of prior CVA, T12 compression fracture, who presented with aspiration pneumonia.  History was provided from staff and patient's mother as patient is nonverbal at baseline secondary to ALS.  Patient has a PEG tube at baseline and was recently evaluated at [facility] due to issues with this tube.  Appears he was still tolerating some p.o. fluids until recently.  Per report patient was actively on hospice at home.  Palliative care was consulted and ultimately family made decision to transition to comfort measures only.  During his admission we assisted with pain and anxiety management. TOC was consult and secured safe discharge to a hospice facility at patient request. He is discharging directly there today.

## 2023-11-09 NOTE — TOC Transition Note (Signed)
 Transition of Care [facility]) - Discharge Note   Patient Details  Name: Shawn [name] MRN: [id number] Date of Birth: 1961/08/22  Transition of [facility]) CM/SW Contact:  Ladene Lady, LCSW Phone Number: 11/09/2023, 11:46 AM   Clinical Narrative:  Pt approved for [facility]. Medical necessity printed to the unit.     Final next level of care: Hospice Medical Facility Barriers to Discharge: Barriers Resolved   Patient Goals and CMS Choice   CMS Medicare.gov Compare Post Acute Care list provided to:: Patient        Discharge Placement                Patient to be transferred to facility by: [facility]   Patient and family notified of of transfer: 11/09/23  Discharge Plan and Services Additional resources added to the After Visit Summary for       Post Acute Care Choice: Resumption of Svcs/PTA Provider                               Social Drivers of Health (SDOH) Interventions SDOH Screenings   Food Insecurity: Patient Declined (11/05/2023)  Housing: Patient Declined (11/05/2023)  Transportation Needs: Patient Declined (11/05/2023)  Utilities: Patient Declined (11/05/2023)  Depression (PHQ2-9): Low Risk  (07/05/2023)  Social Connections: Patient Declined (11/05/2023)  Recent Concern: Social Connections - Moderately Isolated (10/20/2023)  Tobacco Use: Low Risk  (11/05/2023)     Readmission Risk Interventions     No data to display

## 2023-11-09 NOTE — Plan of Care (Signed)
 When we switched out the dilaudid  bag, there was 1ml remaining in the bag.  We weren't able to waste in the pyxis.  The other RN who wasted with me was Auther Bo.

## 2023-11-10 LAB — CULTURE, BLOOD (ROUTINE X 2)
Culture: NO GROWTH
Culture: NO GROWTH
Special Requests: ADEQUATE
Special Requests: ADEQUATE

## 2023-12-01 DEATH — deceased
